# Patient Record
Sex: Male | Born: 1985 | Race: White | Hispanic: No | Marital: Married | Smoking: Never smoker
Health system: Southern US, Community
[De-identification: ages and names within clinical notes are randomized; demographics above are authoritative.]

## PROBLEM LIST (undated history)

## (undated) DIAGNOSIS — F410 Panic disorder [episodic paroxysmal anxiety] without agoraphobia: Secondary | ICD-10-CM

## (undated) DIAGNOSIS — K219 Gastro-esophageal reflux disease without esophagitis: Secondary | ICD-10-CM

## (undated) DIAGNOSIS — I509 Heart failure, unspecified: Secondary | ICD-10-CM

## (undated) DIAGNOSIS — G8929 Other chronic pain: Secondary | ICD-10-CM

## (undated) DIAGNOSIS — I272 Pulmonary hypertension, unspecified: Secondary | ICD-10-CM

## (undated) DIAGNOSIS — R011 Cardiac murmur, unspecified: Secondary | ICD-10-CM

## (undated) DIAGNOSIS — M8700 Idiopathic aseptic necrosis of unspecified bone: Secondary | ICD-10-CM

## (undated) DIAGNOSIS — I00 Rheumatic fever without heart involvement: Secondary | ICD-10-CM

## (undated) DIAGNOSIS — M549 Dorsalgia, unspecified: Secondary | ICD-10-CM

## (undated) DIAGNOSIS — Z954 Presence of other heart-valve replacement: Secondary | ICD-10-CM

## (undated) DIAGNOSIS — R0600 Dyspnea, unspecified: Secondary | ICD-10-CM

## (undated) DIAGNOSIS — I050 Rheumatic mitral stenosis: Secondary | ICD-10-CM

## (undated) DIAGNOSIS — J189 Pneumonia, unspecified organism: Secondary | ICD-10-CM

## (undated) DIAGNOSIS — M1990 Unspecified osteoarthritis, unspecified site: Secondary | ICD-10-CM

## (undated) DIAGNOSIS — F32A Depression, unspecified: Secondary | ICD-10-CM

## (undated) DIAGNOSIS — F329 Major depressive disorder, single episode, unspecified: Secondary | ICD-10-CM

## (undated) HISTORY — PX: MITRAL VALVULOPLASTY: SHX143

## (undated) HISTORY — PX: JOINT REPLACEMENT: SHX530

## (undated) HISTORY — PX: TYMPANOSTOMY TUBE PLACEMENT: SHX32

## (undated) NOTE — *Deleted (*Deleted)
Darren Haynes has dropped out of pulmonary rehab after attending 1 exercise session due to a fever for 2 1/2 weeks.  He has had a hip infection and his doctors are trying to decide where the fever is originating.  He feels he will not be able to participate in the program.

---
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
## 2007-07-18 ENCOUNTER — Encounter: Payer: Self-pay | Admitting: *Deleted

## 2008-01-25 ENCOUNTER — Emergency Department (HOSPITAL_COMMUNITY): Admission: EM | Admit: 2008-01-25 | Discharge: 2008-01-25 | Payer: Self-pay | Admitting: Emergency Medicine

## 2009-03-09 ENCOUNTER — Emergency Department (HOSPITAL_COMMUNITY): Admission: EM | Admit: 2009-03-09 | Discharge: 2009-03-09 | Payer: Self-pay | Admitting: Emergency Medicine

## 2009-07-01 ENCOUNTER — Emergency Department (HOSPITAL_COMMUNITY): Admission: EM | Admit: 2009-07-01 | Discharge: 2009-07-01 | Payer: Self-pay | Admitting: Emergency Medicine

## 2009-12-28 ENCOUNTER — Emergency Department (HOSPITAL_COMMUNITY): Admission: EM | Admit: 2009-12-28 | Discharge: 2009-12-28 | Payer: Self-pay | Admitting: Family Medicine

## 2010-01-28 ENCOUNTER — Emergency Department (HOSPITAL_COMMUNITY): Admission: EM | Admit: 2010-01-28 | Discharge: 2010-01-28 | Payer: Self-pay | Admitting: Family Medicine

## 2010-04-30 ENCOUNTER — Emergency Department (HOSPITAL_COMMUNITY): Admission: EM | Admit: 2010-04-30 | Discharge: 2010-04-30 | Payer: Self-pay | Admitting: Emergency Medicine

## 2011-06-15 DIAGNOSIS — L255 Unspecified contact dermatitis due to plants, except food: Secondary | ICD-10-CM | POA: Insufficient documentation

## 2011-06-15 DIAGNOSIS — T622X1A Toxic effect of other ingested (parts of) plant(s), accidental (unintentional), initial encounter: Secondary | ICD-10-CM | POA: Insufficient documentation

## 2011-06-15 NOTE — ED Notes (Signed)
CUTTING BRUSH TODAY, BOTH ARMS RED AND LEFT WRIST HAS BLISTERS

## 2011-06-16 ENCOUNTER — Emergency Department (HOSPITAL_COMMUNITY)
Admission: EM | Admit: 2011-06-16 | Discharge: 2011-06-16 | Disposition: A | Discharge status: Home / Self Care | Payer: Self-pay | Attending: Emergency Medicine | Admitting: Emergency Medicine

## 2011-06-16 DIAGNOSIS — L259 Unspecified contact dermatitis, unspecified cause: Secondary | ICD-10-CM

## 2011-06-16 MED ORDER — PREDNISONE 10 MG PO TABS: 20.0000 mg | ORAL_TABLET | Freq: Every day | ORAL | Status: AC

## 2011-06-16 MED ORDER — HYDROXYZINE HCL 50 MG/ML IM SOLN
50.0000 mg | Freq: Once | INTRAMUSCULAR | Status: AC
  Administered 2011-06-16: 50 mg via INTRAMUSCULAR
  Filled 2011-06-16: qty 1

## 2011-06-16 MED ORDER — METHYLPREDNISOLONE SODIUM SUCC 125 MG IJ SOLR
125.0000 mg | Freq: Once | INTRAMUSCULAR | Status: AC
Start: 2011-06-16 — End: 2011-06-16
  Administered 2011-06-16: 125 mg via INTRAMUSCULAR
  Filled 2011-06-16: qty 2

## 2011-06-16 MED ORDER — HYDROXYZINE PAMOATE 25 MG PO CAPS: ORAL_CAPSULE | ORAL | Status: DC

## 2011-06-16 MED ORDER — FEXOFENADINE HCL 180 MG PO TABS: 180.0000 mg | ORAL_TABLET | Freq: Every day | ORAL | Status: DC

## 2011-06-16 NOTE — ED Provider Notes (Signed)
History     CSN: [id number] Arrival date & time: 06/16/2011 12:14 AM  Chief Complaint  Patient presents with  . Poison Ivy   Patient is a 25 y.o. male presenting with Poison Ivy. The history is provided by the patient.  Poison Ivy The current episode started 1 to 4 weeks ago. The problem occurs constantly. The problem has been gradually worsening. Associated symptoms include a rash. Pertinent negatives include no abdominal pain, arthralgias, chest pain, coughing or neck pain. The symptoms are aggravated by nothing. He has tried nothing for the symptoms. The treatment provided no relief.  Poison Ivy The current episode started 1 to 4 weeks ago. The problem occurs constantly. The problem has been gradually worsening. Pertinent negatives include no chest pain, no abdominal pain and no shortness of breath. The symptoms are aggravated by nothing. He has tried nothing for the symptoms. The treatment provided no relief.    History reviewed. No pertinent past medical history.  Past Surgical History  Procedure Date  . Tympanostomy tube placement     No family history on file.  History  Substance Use Topics  . Smoking status: Not on file  . Smokeless tobacco: Current User  . Alcohol Use: No      Review of Systems  Constitutional: Negative for activity change.       All ROS Neg except as noted in HPI  HENT: Negative for nosebleeds and neck pain.   Eyes: Negative for photophobia and discharge.  Respiratory: Negative for cough, shortness of breath and wheezing.   Cardiovascular: Negative for chest pain and palpitations.  Gastrointestinal: Negative for abdominal pain and blood in stool.  Genitourinary: Negative for dysuria, frequency and hematuria.  Musculoskeletal: Negative for back pain and arthralgias.  Skin: Positive for rash.  Neurological: Negative for dizziness, seizures and speech difficulty.  Psychiatric/Behavioral: Negative for hallucinations and confusion.    Physical Exam   BP 147/82  Pulse 63  Temp(Src) 98.2 F (36.8 C) (Oral)  Resp 16  Ht 6\' 2"  (1.88 m)  Wt 165 lb (74.844 kg)  BMI 21.18 kg/m2  SpO2 99%  Physical Exam  Nursing note and vitals reviewed. Constitutional: He is oriented to person, place, and time. He appears well-developed and well-nourished.  Non-toxic appearance.  HENT:  Head: Normocephalic.  Right Ear: Tympanic membrane and external ear normal.  Left Ear: Tympanic membrane and external ear normal.  Eyes: EOM and lids are normal. Pupils are equal, round, and reactive to light.  Neck: Normal range of motion. Neck supple. Carotid bruit is not present.  Cardiovascular: Normal rate, regular rhythm, normal heart sounds, intact distal pulses and normal pulses.   Pulmonary/Chest: Breath sounds normal. No respiratory distress.  Abdominal: Soft. Bowel sounds are normal. There is no tenderness. There is no guarding.  Musculoskeletal: Normal range of motion.  Lymphadenopathy:       Head (right side): No submandibular adenopathy present.       Head (left side): No submandibular adenopathy present.    He has no cervical adenopathy.  Neurological: He is alert and oriented to person, place, and time. He has normal strength. No cranial nerve deficit or sensory deficit.  Skin: Skin is warm and dry. Rash noted.       Rash with blisters to the right and left forearm  Psychiatric: He has a normal mood and affect. His speech is normal.    ED Course  Procedures  MDM I have reviewed nursing notes, vital signs, and all appropriate lab  and imaging results for this patient.      Kathie Dike, [place] 06/16/11 0034 Medical screening examination/treatment/procedure(s) were performed by non-physician practitioner and as supervising physician I was immediately available for consultation/collaboration.  Nicoletta Dress. Colon Branch, MD 06/20/11 1225

## 2011-09-22 ENCOUNTER — Emergency Department (HOSPITAL_COMMUNITY)
Admission: EM | Admit: 2011-09-22 | Discharge: 2011-09-22 | Disposition: A | Discharge status: Home / Self Care | Payer: Self-pay | Attending: Emergency Medicine | Admitting: Emergency Medicine

## 2011-09-22 ENCOUNTER — Encounter (HOSPITAL_COMMUNITY): Payer: Self-pay

## 2011-09-22 DIAGNOSIS — R05 Cough: Secondary | ICD-10-CM | POA: Insufficient documentation

## 2011-09-22 DIAGNOSIS — Z79899 Other long term (current) drug therapy: Secondary | ICD-10-CM | POA: Insufficient documentation

## 2011-09-22 DIAGNOSIS — R509 Fever, unspecified: Secondary | ICD-10-CM | POA: Insufficient documentation

## 2011-09-22 DIAGNOSIS — R0982 Postnasal drip: Secondary | ICD-10-CM | POA: Insufficient documentation

## 2011-09-22 DIAGNOSIS — R059 Cough, unspecified: Secondary | ICD-10-CM | POA: Insufficient documentation

## 2011-09-22 DIAGNOSIS — IMO0001 Reserved for inherently not codable concepts without codable children: Secondary | ICD-10-CM | POA: Insufficient documentation

## 2011-09-22 DIAGNOSIS — J3489 Other specified disorders of nose and nasal sinuses: Secondary | ICD-10-CM | POA: Insufficient documentation

## 2011-09-22 DIAGNOSIS — R42 Dizziness and giddiness: Secondary | ICD-10-CM | POA: Insufficient documentation

## 2011-09-22 DIAGNOSIS — R51 Headache: Secondary | ICD-10-CM | POA: Insufficient documentation

## 2011-09-22 DIAGNOSIS — J069 Acute upper respiratory infection, unspecified: Secondary | ICD-10-CM | POA: Insufficient documentation

## 2011-09-22 DIAGNOSIS — R000 Tachycardia, unspecified: Secondary | ICD-10-CM | POA: Insufficient documentation

## 2011-09-22 MED ORDER — IBUPROFEN 800 MG PO TABS
800.0000 mg | ORAL_TABLET | Freq: Once | ORAL | Status: AC
  Administered 2011-09-22: 800 mg via ORAL
  Filled 2011-09-22: qty 1

## 2011-09-22 MED ORDER — ACETAMINOPHEN 500 MG PO TABS
1000.0000 mg | ORAL_TABLET | Freq: Once | ORAL | Status: AC
  Administered 2011-09-22: 1000 mg via ORAL
  Filled 2011-09-22: qty 2

## 2011-09-22 NOTE — ED Notes (Signed)
Pt presents with fever, generalized body aches, dizziness, and cough x 5 days. Pt denies n/v/d. Pt took OTC medication. No relief from medication.

## 2011-09-22 NOTE — ED Provider Notes (Signed)
Medical screening examination/treatment/procedure(s) were performed by non-physician practitioner and as supervising physician I was immediately available for consultation/collaboration.  Geoffery Lyons, MD 09/22/11 2249

## 2011-09-22 NOTE — ED Notes (Signed)
Pt a/ox4. Resp even and unlabored. NAD at this time. D/C instructions reviewed with pt. Pt verbalized understanding. Pt ambulated to POV with steady gate. 

## 2011-09-22 NOTE — ED Provider Notes (Signed)
History     CSN: [id number] Arrival date & time: 09/22/2011  6:08 PM   First MD Initiated Contact with Patient 09/22/11 1811      Chief Complaint  Patient presents with  . Generalized Body Aches  . Fever  . Cough  . Dizziness    (Consider location/radiation/quality/duration/timing/severity/associated sxs/prior treatment) Patient is a 25 y.o. male presenting with fever and cough. The history is provided by the patient.  Fever Primary symptoms of the febrile illness include fever, headaches, cough and myalgias. Primary symptoms do not include vomiting, diarrhea or rash. The current episode started 3 to 5 days ago. This is a new problem.  Associated with: nothing.  Cough Associated symptoms include headaches and myalgias.    No past medical history on file.  Past Surgical History  Procedure Date  . Tympanostomy tube placement     No family history on file.  History  Substance Use Topics  . Smoking status: Not on file  . Smokeless tobacco: Current User  . Alcohol Use: No      Review of Systems  Constitutional: Positive for fever.  HENT: Positive for postnasal drip.   Respiratory: Positive for cough.   Gastrointestinal: Negative for vomiting and diarrhea.  Genitourinary: Negative.   Musculoskeletal: Positive for myalgias.  Skin: Negative for rash.  Neurological: Positive for headaches.  Psychiatric/Behavioral: Negative.     Allergies  Review of patient's allergies indicates no known allergies.  Home Medications   Current Outpatient Rx  Name Route Sig Dispense Refill  . FEXOFENADINE HCL 180 MG PO TABS Oral Take 1 tablet (180 mg total) by mouth daily. 10 tablet 0  . HYDROXYZINE PAMOATE 25 MG PO CAPS  1 at hs for itching/burning 10 capsule 0    There were no vitals taken for this visit.  Physical Exam  Nursing note and vitals reviewed. Constitutional: He is oriented to person, place, and time. He appears well-developed and well-nourished.  Non-toxic  appearance.  HENT:  Head: Normocephalic.  Right Ear: Tympanic membrane and external ear normal.  Left Ear: Tympanic membrane and external ear normal.       Nasal congestion present. Mild increase redness of the posterior pharynx.  Eyes: EOM and lids are normal. Pupils are equal, round, and reactive to light.  Neck: Normal range of motion. Neck supple. Carotid bruit is not present.  Cardiovascular: Normal heart sounds, intact distal pulses and normal pulses.  Tachycardia present.   Pulmonary/Chest: Breath sounds normal. No respiratory distress.  Abdominal: Soft. Bowel sounds are normal. There is no tenderness. There is no guarding.  Musculoskeletal:       Decrease ROM of the upper and lower extremities as well as the back.  Lymphadenopathy:       Head (right side): No submandibular adenopathy present.       Head (left side): No submandibular adenopathy present.    He has no cervical adenopathy.  Neurological: He is alert and oriented to person, place, and time. He has normal strength. No cranial nerve deficit or sensory deficit.  Skin: Skin is warm and dry. No rash noted.  Psychiatric: He has a normal mood and affect. His speech is normal.    ED Course  Procedures (including critical care time)  Labs Reviewed - No data to display No results found.   Dx: URI   MDM  I have reviewed nursing notes, vital signs, and all appropriate lab and imaging results for this patient.        Link Snuffer  Garry Heater, PA 09/22/11 [phone]

## 2012-05-12 ENCOUNTER — Emergency Department (HOSPITAL_COMMUNITY)
Admission: EM | Admit: 2012-05-12 | Discharge: 2012-05-12 | Disposition: A | Discharge status: Home / Self Care | Payer: Self-pay | Attending: Emergency Medicine | Admitting: Emergency Medicine

## 2012-05-12 ENCOUNTER — Encounter (HOSPITAL_COMMUNITY): Payer: Self-pay | Admitting: Emergency Medicine

## 2012-05-12 DIAGNOSIS — J020 Streptococcal pharyngitis: Secondary | ICD-10-CM | POA: Insufficient documentation

## 2012-05-12 DIAGNOSIS — J039 Acute tonsillitis, unspecified: Secondary | ICD-10-CM

## 2012-05-12 LAB — URINALYSIS, ROUTINE W REFLEX MICROSCOPIC
Bilirubin Urine: NEGATIVE
Glucose, UA: NEGATIVE mg/dL
Hgb urine dipstick: NEGATIVE
Ketones, ur: NEGATIVE mg/dL
Leukocytes, UA: NEGATIVE
Nitrite: NEGATIVE
Protein, ur: NEGATIVE mg/dL
Specific Gravity, Urine: 1.028 (ref 1.005–1.030)
Urobilinogen, UA: 1 mg/dL (ref 0.0–1.0)
pH: 6 (ref 5.0–8.0)

## 2012-05-12 LAB — RAPID STREP SCREEN (MED CTR MEBANE ONLY): Streptococcus, Group A Screen (Direct): POSITIVE — AB

## 2012-05-12 MED ORDER — PENICILLIN V POTASSIUM 500 MG PO TABS: 500.0000 mg | ORAL_TABLET | Freq: Four times a day (QID) | ORAL | Status: AC

## 2012-05-12 MED ORDER — ACETAMINOPHEN 325 MG PO TABS
ORAL_TABLET | ORAL | Status: AC
  Filled 2012-05-12: qty 2

## 2012-05-12 MED ORDER — ACETAMINOPHEN 325 MG PO TABS
650.0000 mg | ORAL_TABLET | Freq: Once | ORAL | Status: AC
  Administered 2012-05-12: 650 mg via ORAL

## 2012-05-12 NOTE — Discharge Instructions (Signed)
Use Tylenol or Motrin for pain, and fever. Drink plenty of fluids and get a lot of rest.  Tonsillitis Tonsils are lumps of tissue at the back of the throat. Tonsillitis is an infection of the throat. This infection causes the tonsils to become red, tender, and puffy (swollen). If germs (bacteria) caused the infection, an antibiotic medicine will be given to you. If your tonsillitis is severe and happens often, you may need to get your tonsils removed (tonsillectomy). HOME CARE   Rest and sleep often.   Drink enough fluids to keep your pee (urine) clear or pale yellow.   While your throat is sore, eat soft or liquid foods like:   Soup.   Ice cream.   Instant breakfast drinks.   Eat frozen ice pops.   Gargle with a warm or cold liquid to help soothe the throat. Gargle with a water and salt mix. Mix 1 teaspoon of salt in 1 cup of water.   Only take medicines as told by your doctor.   If you are given medicines (antibiotics), take them as told. Finish them even if you start to feel better.  GET HELP RIGHT AWAY IF:   You throw up (vomit).   You have a very bad headache.   You have a stiff neck.   You have chest pain.   You have trouble breathing or swallowing.   You have bad throat pain, drooling, or your voice changes.   You have bad pain not helped by medicine.   You cannot fully open your mouth.   You have redness, puffiness, or bad pain in the neck.   You have a fever.   The patient is a child younger than 3 months and has a fever.   The patient is a child older than 3 months and has a fever or problems that do not go away.   The patient is a child older than 3 months, has a fever, and his or her problems get worse.   You have large, tender lumps on your neck.   You have a rash.   You cough up green, yellow-brown, or bloody fluid.   You cannot swallow liquids or food for 24 hours.   The patient is a child and cannot swallow liquids or food for 12 hours.    MAKE SURE YOU:   Understand these instructions.   Will watch your condition.   Will get help right away if you are not doing well or get worse.  Document Released: 04/16/2008 Document Revised: 10/18/2011 Document Reviewed: 01/04/2011 [facility] Patient Information 2012 [place], [place].

## 2012-05-12 NOTE — ED Notes (Signed)
Pt reports sore throat, pain w/swallowing, fever, chills, and bilateral ear aches x3 days, pt reports taking OTC medication w/no relief

## 2012-05-12 NOTE — ED Provider Notes (Signed)
History    This chart was scribed for Flint Melter, MD, MD by Smitty Pluck. The patient was seen in room TR07C and the patient's care was started at 5:15PM.   CSN: [id number]  Arrival date & time 05/12/12  1544   None     Chief Complaint  Patient presents with  . Sore Throat    (Consider location/radiation/quality/duration/timing/severity/associated sxs/prior treatment) The history is provided by the patient.   Darren Haynes is a 26 y.o. male who presents to the Emergency Department complaining of moderate sore throat onset 2 days ago. Pt reports that he has been increased sleeping for past two days. Symptoms have been constant since onset. Reports fever. Denies back pain. Reports having diarrhea 3x. He states he had normal intake today. Denies taking medication PTA. Denies any other health issues. Reports he is currently taking prednisone (3 days for poison ivy.  History reviewed. No pertinent past medical history.  Past Surgical History  Procedure Date  . Tympanostomy tube placement     History reviewed. No pertinent family history.  History  Substance Use Topics  . Smoking status: Never Smoker   . Smokeless tobacco: Current User  . Alcohol Use: No      Review of Systems  Constitutional: Positive for fever. Negative for chills.  HENT: Positive for sore throat. Negative for congestion and rhinorrhea.   Respiratory: Negative for cough and shortness of breath.   Gastrointestinal: Positive for diarrhea. Negative for nausea and vomiting.  All other systems reviewed and are negative.    Allergies  Review of patient's allergies indicates no known allergies.  Home Medications   Current Outpatient Rx  Name Route Sig Dispense Refill  . IBUPROFEN 200 MG PO TABS Oral Take 800 mg by mouth every 6 (six) hours as needed. For pain.    [name] Kitchen PREDNISONE 20 MG PO TABS Oral Take 20 mg by mouth daily. Taper pack...started on 6/27    . PENICILLIN V POTASSIUM 500 MG PO TABS Oral Take  1 tablet (500 mg total) by mouth 4 (four) times daily. 40 tablet 0    BP 116/67  Pulse 106  Temp 101.2 F (38.4 C) (Oral)  Resp 18  SpO2 100%  Physical Exam  Nursing note and vitals reviewed. Constitutional: He is oriented to person, place, and time. He appears well-developed and well-nourished. No distress.  HENT:  Head: Normocephalic and atraumatic.  Right Ear: External ear normal.  Left Ear: External ear normal.       Right tonsillar hypertrophy exudate  Tender jugular digastric node on right  Eyes: Conjunctivae are normal. Pupils are equal, round, and reactive to light.  Neck: Normal range of motion. Neck supple.  Cardiovascular: Normal rate, regular rhythm and normal heart sounds.   Pulmonary/Chest: Effort normal. No respiratory distress.  Neurological: He is alert and oriented to person, place, and time.  Skin: Skin is warm and dry.  Psychiatric: He has a normal mood and affect. His behavior is normal.    ED Course  Procedures (including critical care time) DIAGNOSTIC STUDIES: Oxygen Saturation is 100% on room air, normal by my interpretation.    COORDINATION OF CARE: 5:23PM EDP discusses pt ED treatment with pt.    Labs Reviewed  RAPID STREP SCREEN - Abnormal; Notable for the following:    Streptococcus, Group A Screen (Direct) POSITIVE (*)     All other components within normal limits  URINALYSIS, ROUTINE W REFLEX MICROSCOPIC  LAB REPORT - SCANNED   No results  found.   1. Tonsillitis       MDM  Strep Tonsillitis. Doubt metabolic instability, serious bacterial infection or impending vascular collapse; the patient is stable for discharge.  Plan: Home Medications- PCN; Home Treatments- rest, Tylenol; Recommended follow up- PCP of choice prn  I personally performed the services described in this documentation, which was scribed in my presence. The recorded information has been reviewed and considered.         Flint Melter, MD 05/13/12 1032

## 2012-05-12 NOTE — ED Notes (Addendum)
Sick for 2 days; c/o sore throat, otalgia, headaches, fevers, chills; been taking OTC without relief; pt also reports urinary freq- denies dysuria or hematuria; pts throat swollen and white patches noted in back of throat

## 2014-01-07 DIAGNOSIS — G4733 Obstructive sleep apnea (adult) (pediatric): Secondary | ICD-10-CM | POA: Insufficient documentation

## 2014-06-15 DIAGNOSIS — K219 Gastro-esophageal reflux disease without esophagitis: Secondary | ICD-10-CM | POA: Insufficient documentation

## 2014-06-15 DIAGNOSIS — E669 Obesity, unspecified: Secondary | ICD-10-CM | POA: Insufficient documentation

## 2014-07-14 ENCOUNTER — Telehealth: Payer: Self-pay | Admitting: Family Medicine

## 2014-07-14 NOTE — Telephone Encounter (Signed)
Pt is a new pt and advised no appts available today. Pt will go to urgent care today.

## 2016-06-02 ENCOUNTER — Encounter (HOSPITAL_COMMUNITY): Payer: Self-pay | Admitting: *Deleted

## 2016-06-02 DIAGNOSIS — Z5321 Procedure and treatment not carried out due to patient leaving prior to being seen by health care provider: Secondary | ICD-10-CM | POA: Diagnosis not present

## 2016-06-02 DIAGNOSIS — H9202 Otalgia, left ear: Secondary | ICD-10-CM | POA: Diagnosis present

## 2016-06-02 NOTE — ED Notes (Signed)
Lt ear pain for 8 days  Previous history of the samed  Has not been swimming during this time

## 2016-06-03 ENCOUNTER — Emergency Department (HOSPITAL_COMMUNITY)
Admission: EM | Admit: 2016-06-03 | Discharge: 2016-06-03 | Disposition: A | Discharge status: Home / Self Care | Payer: 59 | Attending: Dermatology | Admitting: Dermatology

## 2016-06-04 DIAGNOSIS — H60502 Unspecified acute noninfective otitis externa, left ear: Secondary | ICD-10-CM | POA: Insufficient documentation

## 2016-06-04 DIAGNOSIS — H66012 Acute suppurative otitis media with spontaneous rupture of ear drum, left ear: Secondary | ICD-10-CM | POA: Insufficient documentation

## 2017-11-25 ENCOUNTER — Ambulatory Visit (HOSPITAL_COMMUNITY)
Admission: RE | Admit: 2017-11-25 | Discharge: 2017-11-25 | Disposition: A | Discharge status: Home / Self Care | Payer: Self-pay | Source: Ambulatory Visit | Attending: Internal Medicine | Admitting: Internal Medicine

## 2017-11-25 ENCOUNTER — Other Ambulatory Visit (HOSPITAL_COMMUNITY): Payer: Self-pay | Admitting: Internal Medicine

## 2017-11-25 DIAGNOSIS — M546 Pain in thoracic spine: Secondary | ICD-10-CM

## 2017-11-25 DIAGNOSIS — Z1389 Encounter for screening for other disorder: Secondary | ICD-10-CM | POA: Insufficient documentation

## 2017-11-25 DIAGNOSIS — M549 Dorsalgia, unspecified: Secondary | ICD-10-CM | POA: Insufficient documentation

## 2018-06-05 DIAGNOSIS — R0781 Pleurodynia: Secondary | ICD-10-CM | POA: Insufficient documentation

## 2018-06-06 ENCOUNTER — Other Ambulatory Visit (HOSPITAL_COMMUNITY): Payer: Self-pay | Admitting: Orthopedic Surgery

## 2018-06-06 DIAGNOSIS — R0789 Other chest pain: Secondary | ICD-10-CM

## 2018-06-11 ENCOUNTER — Ambulatory Visit (HOSPITAL_COMMUNITY)
Admission: RE | Admit: 2018-06-11 | Discharge: 2018-06-11 | Disposition: A | Discharge status: Home / Self Care | Payer: Medicaid Other | Source: Ambulatory Visit | Attending: Orthopedic Surgery | Admitting: Orthopedic Surgery

## 2018-06-11 DIAGNOSIS — R0789 Other chest pain: Secondary | ICD-10-CM | POA: Insufficient documentation

## 2018-06-29 ENCOUNTER — Emergency Department (HOSPITAL_COMMUNITY)
Admission: EM | Admit: 2018-06-29 | Discharge: 2018-06-29 | Disposition: A | Discharge status: Home / Self Care | Payer: Medicaid Other | Attending: Emergency Medicine | Admitting: Emergency Medicine

## 2018-06-29 ENCOUNTER — Encounter (HOSPITAL_COMMUNITY): Payer: Self-pay | Admitting: *Deleted

## 2018-06-29 ENCOUNTER — Other Ambulatory Visit: Payer: Self-pay

## 2018-06-29 DIAGNOSIS — L237 Allergic contact dermatitis due to plants, except food: Secondary | ICD-10-CM

## 2018-06-29 HISTORY — DX: Dorsalgia, unspecified: M54.9

## 2018-06-29 HISTORY — DX: Other chronic pain: G89.29

## 2018-06-29 HISTORY — DX: Panic disorder (episodic paroxysmal anxiety): F41.0

## 2018-06-29 MED ORDER — DEXAMETHASONE SODIUM PHOSPHATE 10 MG/ML IJ SOLN
10.0000 mg | Freq: Once | INTRAMUSCULAR | Status: AC
  Administered 2018-06-29: 10 mg via INTRAMUSCULAR
  Filled 2018-06-29: qty 1

## 2018-06-29 MED ORDER — DEXAMETHASONE 4 MG PO TABS: 4.0000 mg | ORAL_TABLET | Freq: Two times a day (BID) | ORAL | 0 refills | Status: DC

## 2018-06-29 NOTE — ED Triage Notes (Signed)
Pt c/o rash to bilateral eyes, arms, legs, feet x 2 days. Pt reports he was in some poison oak 2 days ago. Pt reports his right eye was swollen this morning.

## 2018-06-29 NOTE — Discharge Instructions (Addendum)
Please wash all clothing and linen daily until this rash has completely resolved.  Please wash hands frequently.  Use Decadron 2 times daily with a meal.  Use Claritin or Allegra each morning, use Benadryl at bedtime if needed for itching.  Please see your physician at the [facility] if not improving.  Please return to the emergency department if any emergent changes in your condition, problems, or concerns.

## 2018-06-29 NOTE — ED Notes (Signed)
PT called and made aware of his prescriptions that were electronically prescribed today.

## 2018-06-29 NOTE — ED Provider Notes (Signed)
[facility] EMERGENCY DEPARTMENT Provider Note   CSN: [id number] Arrival date & time: 06/29/18  0907     History   Chief Complaint Chief Complaint  Patient presents with  . Rash    HPI Darren Haynes is a 32 y.o. male.  Patient is a 32 year old male who presents to the emergency department with a contact rash.  The patient states a few days ago while working he noticed that he was in some poison ivy/poison oak.  He has been trying to take care of the rash with conservative measures, but these have not been effective.  Yesterday and today he noted some swelling around his eyes and noticed a little bit of the rash in that area.  He became concerned and came to the emergency department for additional evaluation and management.  No difficulty with breathing or swallowing.  The history is provided by the patient.    Past Medical History:  Diagnosis Date  . Chronic back pain   . Panic attacks     There are no active problems to display for this patient.   Past Surgical History:  Procedure Laterality Date  . TYMPANOSTOMY TUBE PLACEMENT          Home Medications    Prior to Admission medications   Medication Sig Start Date End Date Taking? Authorizing Provider  ibuprofen (ADVIL,MOTRIN) 200 MG tablet Take 800 mg by mouth every 6 (six) hours as needed. For pain.    [provider]  predniSONE (DELTASONE) 20 MG tablet Take 20 mg by mouth daily. Taper pack...started on 6/27    [provider]    Family History No family history on file.  Social History Social History   Tobacco Use  . Smoking status: Never Smoker  . Smokeless tobacco: Current User  Substance Use Topics  . Alcohol use: No  . Drug use: No     Allergies   Patient has no known allergies.   Review of Systems Review of Systems  Constitutional: Negative for activity change.       All ROS Neg except as noted in HPI  HENT: Negative for nosebleeds.   Eyes: Negative for photophobia  and discharge.  Respiratory: Negative for cough, shortness of breath and wheezing.   Cardiovascular: Negative for chest pain and palpitations.  Gastrointestinal: Negative for abdominal pain and blood in stool.  Genitourinary: Negative for dysuria, frequency and hematuria.  Musculoskeletal: Negative for arthralgias, back pain and neck pain.  Skin: Positive for rash.  Neurological: Negative for dizziness, seizures and speech difficulty.  Psychiatric/Behavioral: Negative for confusion and hallucinations.     Physical Exam Updated Vital Signs BP 132/76   Pulse 68   Temp (!) 97.1 F (36.2 C) (Temporal)   Resp 16   Ht 6\' 1"  (1.854 m)   Wt 80.7 kg   SpO2 100%   BMI 23.48 kg/m   Physical Exam  Constitutional: He is oriented to person, place, and time. He appears well-developed and well-nourished.  Non-toxic appearance.  HENT:  Head: Normocephalic.  Right Ear: Tympanic membrane and external ear normal.  Left Ear: Tympanic membrane and external ear normal.  There is increased redness of the upper lids.  There he wanted to find bumps on the upper lid and also a few at the lower orbit area.  The patient can open and close the eye without problem.  The extraocular movement is intact.  Eyes: Pupils are equal, round, and reactive to light. EOM and lids are normal.  Neck: Normal range of motion. Neck supple. Carotid bruit is not present.  Cardiovascular: Normal rate, regular rhythm, normal heart sounds, intact distal pulses and normal pulses.  Pulmonary/Chest: Breath sounds normal. No respiratory distress.  Abdominal: Soft. Bowel sounds are normal. There is no tenderness. There is no guarding.  Musculoskeletal: Normal range of motion.  Lymphadenopathy:       Head (right side): No submandibular adenopathy present.       Head (left side): No submandibular adenopathy present.    He has no cervical adenopathy.  Neurological: He is alert and oriented to person, place, and time. He has normal  strength. No cranial nerve deficit or sensory deficit.  Skin: Skin is warm and dry.  There is a red rash noted on the arms and legs.  Some of them have a fine bumps present and a few with tiny blisters present.  Psychiatric: He has a normal mood and affect. His speech is normal.  Nursing note and vitals reviewed.    ED Treatments / Results  Labs (all labs ordered are listed, but only abnormal results are displayed) Labs Reviewed - No data to display  EKG None  Radiology No results found.  Procedures Procedures (including critical care time)  Medications Ordered in ED Medications  dexamethasone (DECADRON) injection 10 mg (has no administration in time range)     Initial Impression / Assessment and Plan / ED Course  I have reviewed the triage vital signs and the nursing notes.  Pertinent labs & imaging results that were available during my care of the patient were reviewed by me and considered in my medical decision making (see chart for details).       Final Clinical Impressions(s) / ED Diagnoses MDM  Vital signs are within normal limits.  Patient has a contact dermatitis probably with poison ivy or poison oak.  No systemic reaction noted.  The patient will be treated here in the emergency department with intramuscular Decadron.  Patient was placed on a short course of Decadron orally.  I have asked the patient to use Claritin or Allegra each morning and then Benadryl at bedtime if needed for the itching.  Patient acknowledges understanding of the instructions.  Of also asked the patient to wash all linen and all clothing daily until this has completely resolved.   Final diagnoses:  None    ED Discharge Orders    None    Notified by nursing staff that the patient gave her information that he has some steroids at home.  He has already received his steroid injection.  He states that he and his wife had to leave.  The patient left before receiving discharge instructions  and prescriptions.   Ivery Quale, PA-C 06/29/18 1135    Samuel Jester, DO 06/29/18 [phone]

## 2018-06-29 NOTE — ED Notes (Addendum)
PT left without d/c papers. Ivery Quale, PA made aware.

## 2018-07-08 DIAGNOSIS — M542 Cervicalgia: Secondary | ICD-10-CM | POA: Insufficient documentation

## 2018-07-09 ENCOUNTER — Other Ambulatory Visit (HOSPITAL_COMMUNITY): Payer: Self-pay | Admitting: Orthopedic Surgery

## 2018-07-09 DIAGNOSIS — M545 Low back pain: Secondary | ICD-10-CM

## 2018-07-15 ENCOUNTER — Ambulatory Visit (HOSPITAL_COMMUNITY)
Admission: RE | Admit: 2018-07-15 | Discharge: 2018-07-15 | Disposition: A | Discharge status: Home / Self Care | Payer: Self-pay | Source: Ambulatory Visit | Attending: Orthopedic Surgery | Admitting: Orthopedic Surgery

## 2018-07-15 ENCOUNTER — Ambulatory Visit (HOSPITAL_COMMUNITY)
Admission: RE | Admit: 2018-07-15 | Discharge: 2018-07-15 | Disposition: A | Discharge status: Home / Self Care | Payer: Medicaid Other | Source: Ambulatory Visit | Attending: Orthopedic Surgery | Admitting: Orthopedic Surgery

## 2018-07-15 DIAGNOSIS — M545 Low back pain: Secondary | ICD-10-CM

## 2018-07-15 DIAGNOSIS — M546 Pain in thoracic spine: Secondary | ICD-10-CM | POA: Insufficient documentation

## 2018-07-15 DIAGNOSIS — M5124 Other intervertebral disc displacement, thoracic region: Secondary | ICD-10-CM | POA: Insufficient documentation

## 2018-07-15 DIAGNOSIS — M5127 Other intervertebral disc displacement, lumbosacral region: Secondary | ICD-10-CM | POA: Insufficient documentation

## 2018-07-15 MED ORDER — GADOBENATE DIMEGLUMINE 529 MG/ML IV SOLN
15.0000 mL | Freq: Once | INTRAVENOUS | Status: AC | PRN
  Administered 2018-07-15: 15 mL via INTRAVENOUS

## 2018-07-15 MED ORDER — GADOBENATE DIMEGLUMINE 529 MG/ML IV SOLN: 15.0000 mL | Freq: Once | INTRAVENOUS | Status: DC | PRN

## 2018-08-07 ENCOUNTER — Other Ambulatory Visit (HOSPITAL_BASED_OUTPATIENT_CLINIC_OR_DEPARTMENT_OTHER): Payer: Self-pay | Admitting: Pain Medicine

## 2018-08-07 DIAGNOSIS — R2990 Unspecified symptoms and signs involving the nervous system: Secondary | ICD-10-CM

## 2018-08-08 ENCOUNTER — Ambulatory Visit (HOSPITAL_COMMUNITY)
Admission: RE | Admit: 2018-08-08 | Discharge: 2018-08-08 | Disposition: A | Discharge status: Home / Self Care | Payer: Medicaid Other | Source: Ambulatory Visit | Attending: Pain Medicine | Admitting: Pain Medicine

## 2018-08-08 ENCOUNTER — Other Ambulatory Visit (HOSPITAL_COMMUNITY): Payer: Self-pay | Admitting: Pain Medicine

## 2018-08-08 DIAGNOSIS — R2990 Unspecified symptoms and signs involving the nervous system: Secondary | ICD-10-CM

## 2018-08-08 DIAGNOSIS — R930 Abnormal findings on diagnostic imaging of skull and head, not elsewhere classified: Secondary | ICD-10-CM | POA: Insufficient documentation

## 2018-08-11 ENCOUNTER — Emergency Department (HOSPITAL_COMMUNITY)
Admission: EM | Admit: 2018-08-11 | Discharge: 2018-08-11 | Discharge status: Against Medical Advice | Payer: Medicaid Other | Attending: Emergency Medicine | Admitting: Emergency Medicine

## 2018-08-11 ENCOUNTER — Encounter (HOSPITAL_COMMUNITY): Payer: Self-pay | Admitting: *Deleted

## 2018-08-11 DIAGNOSIS — M546 Pain in thoracic spine: Secondary | ICD-10-CM | POA: Insufficient documentation

## 2018-08-11 DIAGNOSIS — R531 Weakness: Secondary | ICD-10-CM

## 2018-08-11 DIAGNOSIS — M6281 Muscle weakness (generalized): Secondary | ICD-10-CM | POA: Insufficient documentation

## 2018-08-11 DIAGNOSIS — F17220 Nicotine dependence, chewing tobacco, uncomplicated: Secondary | ICD-10-CM | POA: Insufficient documentation

## 2018-08-11 DIAGNOSIS — M545 Low back pain: Secondary | ICD-10-CM | POA: Insufficient documentation

## 2018-08-11 NOTE — ED Provider Notes (Signed)
[facility] EMERGENCY DEPARTMENT Provider Note   CSN: [id number] Arrival date & time: 08/11/18  1931     History   Chief Complaint Chief Complaint  Patient presents with  . Numbness    HPI Darren Haynes is a 32 y.o. male.  HPI Patient presents with back pain and weakness.  Has been having back pain over the last 11 months.  Has been seen at several different places for it.  Has had MRIs of brain cervical spine thoracic spine and lumbar spine.  Has had blood work done.  States he needs to see a neurologist but the neurologist he went to ordered an MRI but would not see him.  Has seen Dr. Darrelyn Hillock.  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  Otherwise core reassuring.  MRI of brain shows some nonspecific white matter lesions.  Patient states that it started with pain in his mid back that went to the whole back.  Then localized in the mid thoracic back.  States he now has weakness in his arms and legs.  Worse with exertion.  Worse with heat.  States he came here to see a neurologist.  No loss of bladder or bowel control.  Has had falls. Past Medical History:  Diagnosis Date  . Chronic back pain   . Panic attacks     There are no active problems to display for this patient.   Past Surgical History:  Procedure Laterality Date  . TYMPANOSTOMY TUBE PLACEMENT          Home Medications    Prior to Admission medications   Medication Sig Start Date End Date Taking? Authorizing Provider  dexamethasone (DECADRON) 4 MG tablet Take 1 tablet (4 mg total) by mouth 2 (two) times daily with a meal. 06/29/18   Ivery Quale, PA-C  ibuprofen (ADVIL,MOTRIN) 200 MG tablet Take 800 mg by mouth every 6 (six) hours as needed. For pain.    [provider]  predniSONE (DELTASONE) 20 MG tablet Take 20 mg by mouth daily. Taper pack...started on 6/27    [provider]    Family History History reviewed. No pertinent family history.  Social History Social History   Tobacco  Use  . Smoking status: Never Smoker  . Smokeless tobacco: Current User    Types: Snuff  Substance Use Topics  . Alcohol use: No  . Drug use: No     Allergies   Patient has no known allergies.   Review of Systems Review of Systems  Constitutional: Negative for appetite change.  HENT: Negative for congestion.   Respiratory: Negative for shortness of breath.   Gastrointestinal: Negative for abdominal pain.  Genitourinary: Negative for flank pain.  Musculoskeletal: Positive for back pain and gait problem.  Skin: Negative for rash.  Neurological: Positive for weakness.  Psychiatric/Behavioral: Negative for confusion.     Physical Exam Updated Vital Signs BP 137/84 (BP Location: Right Arm)   Pulse 94   Temp 98.3 F (36.8 C) (Oral)   Resp 18   Ht 6\' 1"  (1.854 m)   Wt 79.8 kg   SpO2 100%   BMI 23.22 kg/m   Physical Exam  Constitutional: He is oriented to person, place, and time. He appears well-developed.  HENT:  Head: Atraumatic.  Eyes: Pupils are equal, round, and reactive to light.  Neck: Neck supple.  Cardiovascular: Normal rate.  Pulmonary/Chest: Effort normal.  Abdominal: There is no tenderness.  Musculoskeletal: He exhibits no tenderness.  Neurological: He is alert  and oriented to person, place, and time.  Good grip strength bilaterally.  Able to ambulate but appears to have some pain with it.  Able to do a squat.  Skin: Skin is warm. Capillary refill takes less than 2 seconds.     ED Treatments / Results  Labs (all labs ordered are listed, but only abnormal results are displayed) Labs Reviewed - No data to display  EKG None  Radiology No results found.  Procedures Procedures (including critical care time)  Medications Ordered in ED Medications - No data to display   Initial Impression / Assessment and Plan / ED Course  I have reviewed the triage vital signs and the nursing notes.  Pertinent labs & imaging results that were available during  my care of the patient were reviewed by me and considered in my medical decision making (see chart for details).     Patient with back pain and weakness over 11 months.  Worse over last 3 months.  Has seen neurosurgery and some neurologist.  Briefly discussed with neurologist at [facility].  Thinks could be more of an outpatient work-up.  Discussed with patient about following up as an outpatient neurologist we would give him information to follow-up.  Patient was not willing to stay for the paperwork since we cannot get him immediately seen by a neurologist.  Left without receiving his paperwork  Final Clinical Impressions(s) / ED Diagnoses   Final diagnoses:  Weakness    ED Discharge Orders    None       Benjiman Core, MD 08/11/18 2110

## 2018-08-11 NOTE — ED Triage Notes (Signed)
Pt with numbness and weakness to bilateral arms and legs for 11 months,  Pt has no insurance and per pt neurologist would not see pt. Pt has came here for evaluation due to pt not knowing where else to go.  Pt states he has had a MRI done here recently.  Pt concerned about having MS or  some other dx due to symptoms.

## 2018-08-11 NOTE — ED Notes (Signed)
Pt left before discharge papers were given.

## 2018-08-16 ENCOUNTER — Other Ambulatory Visit (HOSPITAL_BASED_OUTPATIENT_CLINIC_OR_DEPARTMENT_OTHER): Payer: Medicaid Other

## 2018-09-18 ENCOUNTER — Encounter: Payer: Self-pay | Admitting: Cardiovascular Disease

## 2018-09-29 LAB — PULMONARY FUNCTION TEST

## 2018-11-21 ENCOUNTER — Encounter: Payer: Self-pay | Admitting: Cardiovascular Disease

## 2018-11-21 DIAGNOSIS — I272 Pulmonary hypertension, unspecified: Secondary | ICD-10-CM | POA: Insufficient documentation

## 2018-12-01 ENCOUNTER — Encounter: Payer: Self-pay | Admitting: Cardiovascular Disease

## 2018-12-04 ENCOUNTER — Encounter: Payer: Self-pay | Admitting: Cardiovascular Disease

## 2018-12-08 ENCOUNTER — Emergency Department (HOSPITAL_COMMUNITY): Payer: BLUE CROSS/BLUE SHIELD

## 2018-12-08 ENCOUNTER — Other Ambulatory Visit: Payer: Self-pay

## 2018-12-08 ENCOUNTER — Emergency Department (HOSPITAL_COMMUNITY)
Admission: EM | Admit: 2018-12-08 | Discharge: 2018-12-09 | Disposition: A | Discharge status: Home / Self Care | Payer: BLUE CROSS/BLUE SHIELD | Attending: Emergency Medicine | Admitting: Emergency Medicine

## 2018-12-08 ENCOUNTER — Encounter (HOSPITAL_COMMUNITY): Payer: Self-pay

## 2018-12-08 DIAGNOSIS — R079 Chest pain, unspecified: Secondary | ICD-10-CM

## 2018-12-08 DIAGNOSIS — M79605 Pain in left leg: Secondary | ICD-10-CM | POA: Insufficient documentation

## 2018-12-08 DIAGNOSIS — R072 Precordial pain: Secondary | ICD-10-CM | POA: Diagnosis not present

## 2018-12-08 DIAGNOSIS — I050 Rheumatic mitral stenosis: Secondary | ICD-10-CM | POA: Insufficient documentation

## 2018-12-08 DIAGNOSIS — F17290 Nicotine dependence, other tobacco product, uncomplicated: Secondary | ICD-10-CM | POA: Diagnosis not present

## 2018-12-08 DIAGNOSIS — M79604 Pain in right leg: Secondary | ICD-10-CM

## 2018-12-08 MED ORDER — SODIUM CHLORIDE 0.9% FLUSH: 3.0000 mL | Freq: Once | INTRAVENOUS | Status: DC

## 2018-12-08 NOTE — ED Triage Notes (Signed)
Pt here with central chest pain and bilateral lower extremity edema.  States he was diagnosed with CHF in the last week.  Chest pain never goes away just continues to get worse. Pain has been there for approximately a year.   A&Ox4.

## 2018-12-09 ENCOUNTER — Ambulatory Visit (INDEPENDENT_AMBULATORY_CARE_PROVIDER_SITE_OTHER): Payer: BLUE CROSS/BLUE SHIELD | Admitting: Cardiovascular Disease

## 2018-12-09 ENCOUNTER — Encounter: Payer: Self-pay | Admitting: Cardiovascular Disease

## 2018-12-09 ENCOUNTER — Other Ambulatory Visit: Payer: Self-pay

## 2018-12-09 ENCOUNTER — Emergency Department (HOSPITAL_COMMUNITY)
Admission: EM | Admit: 2018-12-09 | Discharge: 2018-12-10 | Disposition: A | Discharge status: Home / Self Care | Payer: BLUE CROSS/BLUE SHIELD | Source: Home / Self Care | Attending: Emergency Medicine | Admitting: Emergency Medicine

## 2018-12-09 ENCOUNTER — Encounter (HOSPITAL_COMMUNITY): Payer: Self-pay | Admitting: Emergency Medicine

## 2018-12-09 VITALS — BP 114/72 | HR 56 | Ht 72.0 in | Wt 187.0 lb

## 2018-12-09 DIAGNOSIS — I342 Nonrheumatic mitral (valve) stenosis: Secondary | ICD-10-CM

## 2018-12-09 DIAGNOSIS — I050 Rheumatic mitral stenosis: Secondary | ICD-10-CM | POA: Insufficient documentation

## 2018-12-09 DIAGNOSIS — R072 Precordial pain: Secondary | ICD-10-CM

## 2018-12-09 LAB — BASIC METABOLIC PANEL
Anion gap: 10 (ref 5–15)
BUN: 14 mg/dL (ref 6–20)
CO2: 23 mmol/L (ref 22–32)
Calcium: 8.8 mg/dL — ABNORMAL LOW (ref 8.9–10.3)
Chloride: 105 mmol/L (ref 98–111)
Creatinine, Ser: 1.23 mg/dL (ref 0.61–1.24)
GFR calc Af Amer: >60 mL/min (ref >60)
GFR calc non Af Amer: >60 mL/min (ref >60)
Glucose, Bld: 156 mg/dL — ABNORMAL HIGH (ref 70–99)
Potassium: 4.2 mmol/L (ref 3.5–5.1)
Sodium: 138 mmol/L (ref 135–145)

## 2018-12-09 LAB — CBC
HCT: 38.6 % — ABNORMAL LOW (ref 39.0–52.0)
Hemoglobin: 12.6 g/dL — ABNORMAL LOW (ref 13.0–17.0)
MCH: 29.2 pg (ref 26.0–34.0)
MCHC: 32.6 g/dL (ref 30.0–36.0)
MCV: 89.4 fL (ref 80.0–100.0)
Platelets: 209 10*3/uL (ref 150–400)
RBC: 4.32 MIL/uL (ref 4.22–5.81)
RDW: 12.3 % (ref 11.5–15.5)
WBC: 6.7 10*3/uL (ref 4.0–10.5)
nRBC: 0 % (ref 0.0–0.2)

## 2018-12-09 LAB — D-DIMER, QUANTITATIVE: D-Dimer, Quant: <0.27 ug/mL-FEU (ref 0.00–0.50)

## 2018-12-09 LAB — I-STAT TROPONIN, ED: Troponin i, poc: 0 ng/mL (ref 0.00–0.08)

## 2018-12-09 LAB — BRAIN NATRIURETIC PEPTIDE: B Natriuretic Peptide: 968.4 pg/mL — ABNORMAL HIGH (ref 0.0–100.0)

## 2018-12-09 MED ORDER — FUROSEMIDE 40 MG PO TABS: 40.0000 mg | ORAL_TABLET | Freq: Every day | ORAL | 3 refills | Status: DC

## 2018-12-09 MED ORDER — ALUM & MAG HYDROXIDE-SIMETH 200-200-20 MG/5ML PO SUSP
30.0000 mL | Freq: Once | ORAL | Status: AC
  Administered 2018-12-09: 30 mL via ORAL
  Filled 2018-12-09: qty 30

## 2018-12-09 NOTE — Discharge Instructions (Addendum)
You were evaluated in the Emergency Department and after careful evaluation, we did not find any emergent condition requiring admission or further testing in the hospital.  Please return to the Emergency Department if you experience any worsening of your condition.  We encourage you to follow up with your surgeon later today.  Thank you for allowing us to be a part of your care.

## 2018-12-09 NOTE — ED Provider Notes (Signed)
[facility] EMERGENCY DEPARTMENT Provider Note   CSN: [id number] Arrival date & time: 12/09/18  2250     History   Chief Complaint Chief Complaint  Patient presents with  . Chest Pain    HPI Darren Haynes is a 33 y.o. male.  The history is provided by the patient and the spouse.  Chest Pain  Pain location:  Substernal area Pain quality: burning   Pain severity:  Moderate Onset quality:  Sudden Timing:  Constant Progression:  Improving Chronicity:  Recurrent Context: eating   Context comment:  Using an inhaler Relieved by:  None tried Associated symptoms: abdominal pain and cough   Associated symptoms: no fever, no lower extremity edema, no shortness of breath, no syncope and no vomiting   Patient with known history of mitral stenosis presents with chest pain.  He reports this evening after eating a heavy dinner and using his inhaler he began having chest burning into his abdomen. No shortness of breath.  He reports that he has heartburn.  No vomiting.  He reports cough without hemoptysis.  No new shortness of breath.  He reports chronic dyspnea on exertion from his mitral stenosis.  No syncope.   Past Medical History:  Diagnosis Date  . Chronic back pain   . Panic attacks     Patient Active Problem List   Diagnosis Date Noted  . Mitral stenosis 12/09/2018    Past Surgical History:  Procedure Laterality Date  . TYMPANOSTOMY TUBE PLACEMENT          Home Medications    Prior to Admission medications   Medication Sig Start Date End Date Taking? Authorizing Provider  albuterol (PROVENTIL) (2.5 MG/3ML) 0.083% nebulizer solution Take 2.5 mg by nebulization every 6 (six) hours as needed for wheezing or shortness of breath.    [provider]  ALPRAZolam Prudy Feeler) 0.25 MG tablet Take 0.25 mg by mouth 3 (three) times daily as needed for anxiety.    [provider]  aspirin EC 81 MG tablet Take 81 mg by mouth daily.    [provider]    Buprenorphine HCl (BELBUCA) 300 MCG FILM Place 1 Film inside cheek 2 (two) times daily.    [provider]  etodolac (LODINE XL) 400 MG 24 hr tablet Take 400 mg by mouth daily.    [provider]  fluticasone-salmeterol (ADVAIR HFA) [phone] MCG/ACT inhaler Inhale 2 puffs into the lungs 2 (two) times daily.    [provider]  furosemide (LASIX) 40 MG tablet Take 1 tablet (40 mg total) by mouth daily. DO NOT TAKE ON THE DAY OF YOUR PROCEDURE ON 12/11/2018 12/09/18 03/09/19  Runell Gess, MD  gabapentin (NEURONTIN) 300 MG capsule Take 1,500 mg by mouth at bedtime.     [provider]  HYDROcodone-acetaminophen (NORCO) 10-325 MG tablet Take 1 tablet by mouth every 8 (eight) hours as needed.    [provider]  metaxalone (SKELAXIN) 800 MG tablet Take 800 mg by mouth 4 (four) times daily.    [provider]  nortriptyline (PAMELOR) 25 MG capsule Take 25 mg by mouth daily. 10/24/18   [provider]  potassium chloride (K-DUR,KLOR-CON) 10 MEQ tablet Take 10 mEq by mouth daily.    [provider]  sertraline (ZOLOFT) 50 MG tablet Take 50 mg by mouth daily.    [provider]  temazepam (RESTORIL) 7.5 MG capsule Take 7.5 mg by mouth at bedtime as needed. 09/18/18   [provider]  Vitamin D, Ergocalciferol, (DRISDOL) 1.25 MG (50000 UT) CAPS capsule Take 50,000 Units by mouth every 7 (seven) days.    [provider]    Family History Family History  Problem Relation Age of Onset  . Heart disease Mother   . Depression Mother   . Hypertension Father   . Hyperlipidemia Father     Social History Social History   Tobacco Use  . Smoking status: Never Smoker  . Smokeless tobacco: Current User    Types: Snuff  Substance Use Topics  . Alcohol use: No  . Drug use: No     Allergies   Patient has no known allergies.   Review of Systems Review of Systems  Constitutional: Negative for fever.   Respiratory: Positive for cough. Negative for shortness of breath.   Cardiovascular: Positive for chest pain. Negative for leg swelling and syncope.  Gastrointestinal: Positive for abdominal pain. Negative for diarrhea and vomiting.  Neurological: Negative for syncope.  All other systems reviewed and are negative.    Physical Exam Updated Vital Signs BP 128/81   Pulse 68   Temp (!) 97.5 F (36.4 C) (Oral)   Resp 16   Ht 1.829 m (6')   Wt 84 kg   SpO2 100%   BMI 25.12 kg/m   Physical Exam  CONSTITUTIONAL: Well developed/well nourished HEAD: Normocephalic/atraumatic EYES: EOMI/PERRL ENMT: Mucous membranes moist NECK: supple no meningeal signs SPINE/BACK:entire spine nontender CV: S1/S2 noted, mitral opening snap noted LUNGS: Lungs are clear to auscultation bilaterally, no apparent distress ABDOMEN: soft, nontender, no rebound or guarding, bowel sounds noted throughout abdomen GU:no cva tenderness NEURO: Pt is awake/alert/appropriate, moves all extremitiesx4.  No facial droop.   EXTREMITIES: pulses normal/equal, full ROM, no lower extremity edema SKIN: warm, color normal PSYCH: no abnormalities of mood noted, alert and oriented to situation  ED Treatments / Results  Labs (all labs ordered are listed, but only abnormal results are displayed) Labs Reviewed - No data to display  EKG EKG Interpretation  Date/Time:  Tuesday December 09 2018 23:14:29 EST Ventricular Rate:  69 PR Interval:    QRS Duration: 101 QT Interval:  409 QTC Calculation: 439 R Axis:   69 Text Interpretation:  Sinus rhythm Left atrial enlargement Baseline wander in lead(s) V1 V5 Confirmed by Zadie Rhine ([id number]) on 12/09/2018 11:40:45 PM   Radiology Dg Chest 2 View  Result Date: 12/08/2018 CLINICAL DATA:  Central chest pain EXAM: CHEST - 2 VIEW COMPARISON:  Chest CT 06/11/2018 FINDINGS: Borderline heart size. Mild peribronchial thickening. No confluent airspace opacities or effusions. No  acute bony abnormality. IMPRESSION: Borderline heart size.  Mild bronchitic changes. Electronically Signed   By: Charlett Nose M.D.   On: 12/08/2018 23:58    Procedures Procedures (including critical care time)  Medications Ordered in ED Medications  alum & mag hydroxide-simeth (MAALOX/MYLANTA) 200-200-20 MG/5ML suspension 30 mL (30 mLs Oral Given 12/09/18 2335)     Initial Impression / Assessment and Plan / ED Course  I have reviewed the triage vital signs and the nursing notes.   Presents for recurrent chest pain/burning after eating.  He is now feeling improved.  No acute EKG changes.  He just had extensive ER evaluation with negative x-ray and negative troponins/d-dimer.  He has already been seen by cardiology and has plan for outpatient cardiac cath on January 30 for his mitral stenosis.  He already had a negative d-dimer on January 28 Suspicion for ACS/PE/dissection is low. No signs of acute heart failure  as his lungs are clear and no lower extremity edema. 12:10 AM Patient feeling improved.  He thinks this was heartburn as it occurred immediately after eating. He has follow-up plan in place with cardiac catheterization on January 30. Will discharge home Final Clinical Impressions(s) / ED Diagnoses   Final diagnoses:  Precordial pain  Mitral valve stenosis, unspecified etiology    ED Discharge Orders    None       Zadie RhineWickline, Deeana Atwater, MD 12/10/18 0010

## 2018-12-09 NOTE — ED Triage Notes (Signed)
Pt seen here for same this AM. Pt also seen at cardiology today. Pt states he came to the ER tonight because of worsening heartburn and chest pain.

## 2018-12-09 NOTE — H&P (View-Only) (Signed)
12/09/2018 Darren Haynes   1986/04/12  [id number]  [facility], [facility] Cardiologist: Darren Gess MD Darren Haynes, [place], [place]  HPI:  Darren Haynes is a 33 y.o..  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  He was referred by Dr. Hanley Haynes for evaluation of symptomatic mitral stenosis.  There is no history of rheumatic fever.  He has no other cardiac risk factors.  He is had progressive dyspnea for the last 9 months with some nonspecific aches and pains.  Is being worked up by rheumatologist.  Echo performed 11/21/2018 revealed normal LV systolic function, a peak mitral gradient of 47 mmHg with a mean of 29 mmHg and mitral valve area 1.5 cm with a PA pressure of 69 mmHg.  He does have severe dyspnea and lower extreme edema.  He is on oral diuretic.   Current Meds  Medication Sig  . albuterol (PROVENTIL) (2.5 MG/3ML) 0.083% nebulizer solution Take 2.5 mg by nebulization every 6 (six) hours as needed for wheezing or shortness of breath.  . ALPRAZolam (XANAX) 0.25 MG tablet Take 0.25 mg by mouth 3 (three) times daily as needed for anxiety.  [name] Kitchen aspirin EC 81 MG tablet Take 81 mg by mouth daily.  . Buprenorphine HCl (BELBUCA) 300 MCG FILM Place 1 Film inside cheek 2 (two) times daily.  [name] Kitchen etodolac (LODINE XL) 400 MG 24 hr tablet Take 400 mg by mouth daily.  . fluticasone-salmeterol (ADVAIR HFA) 45-21 MCG/ACT inhaler Inhale 2 puffs into the lungs 2 (two) times daily.  [name] Kitchen gabapentin (NEURONTIN) 300 MG capsule Take 1,500 mg by mouth at bedtime.   [name] Kitchen HYDROcodone-acetaminophen (NORCO) 10-325 MG tablet Take 1 tablet by mouth every 8 (eight) hours as needed.  . metaxalone (SKELAXIN) 800 MG tablet Take 800 mg by mouth 4 (four) times daily.  . nortriptyline (PAMELOR) 25 MG capsule Take 25 mg by mouth daily.  . potassium chloride (K-DUR,KLOR-CON) 10 MEQ  tablet Take 10 mEq by mouth daily.  . sertraline (ZOLOFT) 50 MG tablet Take 50 mg by mouth daily.  . temazepam (RESTORIL) 7.5 MG capsule Take 7.5 mg by mouth at bedtime as needed.  . Vitamin D, Ergocalciferol, (DRISDOL) 1.25 MG (50000 UT) CAPS capsule Take 50,000 Units by mouth every 7 (seven) days.  . [DISCONTINUED] furosemide (LASIX) 40 MG tablet Take 40 mg by mouth daily.     No Known Allergies  Social History   Socioeconomic History  . Marital status: Married    Spouse name: Not on file  . Number of children: Not on file  . Years of education: Not on file  . Highest education level: Not on file  Occupational History  . Not on file  Social Needs  . Financial resource strain: Not on file  . Food insecurity:    Worry: Not on file    Inability: Not on file  . Transportation needs:    Medical: Not on file    Non-medical: Not on file  Tobacco Use  . Smoking status: Never Smoker  . Smokeless tobacco: Current User    Types: Snuff  Substance and Sexual Activity  . Alcohol use: No  . Drug use: No  . Sexual activity: Not on file  Lifestyle  . Physical activity:    Days per week: Not on file    Minutes per session: Not on file  .  Stress: Not on file  Relationships  . Social connections:    Talks on phone: Not on file    Gets together: Not on file    Attends religious service: Not on file    Active member of club or organization: Not on file    Attends meetings of clubs or organizations: Not on file    Relationship status: Not on file  . Intimate partner violence:    Fear of current or ex partner: Not on file    Emotionally abused: Not on file    Physically abused: Not on file    Forced sexual activity: Not on file  Other Topics Concern  . Not on file  Social History Narrative  . Not on file     Review of Systems: General: negative for chills, fever, night sweats or weight changes.  Cardiovascular: negative for chest pain, dyspnea on exertion, edema, orthopnea,  palpitations, paroxysmal nocturnal dyspnea or shortness of breath Dermatological: negative for rash Respiratory: negative for cough or wheezing Urologic: negative for hematuria Abdominal: negative for nausea, vomiting, diarrhea, bright red blood per rectum, melena, or hematemesis Neurologic: negative for visual changes, syncope, or dizziness All other systems reviewed and are otherwise negative except as noted above.    Blood pressure 114/72, pulse (!) 56, height 6' (1.829 m), weight 187 lb (84.8 kg).  General appearance: alert and no distress Neck: no adenopathy, no carotid bruit, no JVD, supple, symmetrical, trachea midline and thyroid not enlarged, symmetric, no tenderness/mass/nodules Lungs: clear to auscultation bilaterally Heart: Mitral opening snap Extremities: extremities normal, atraumatic, no cyanosis or edema Pulses: 2+ and symmetric Skin: Skin color, texture, turgor normal. No rashes or lesions Neurologic: Alert and oriented X 3, normal strength and tone. Normal symmetric reflexes. Normal coordination and gait  EKG not performed today  ASSESSMENT AND PLAN:   Mitral stenosis Darren Haynes was referred to me by Dr. Rosario for evaluation of symptomatic mitral stenosis.  There is no history of rheumatic fever as a child.  Has had increasing dyspnea on exertion over the last 6 months with recent 2D echo performed 11/21/2018 revealing severe MS with a peak gradient of 47, mean of 29 with a mitral valve area of 1.5 cm.  And a pulmonary artery pressure of 69 mmHg.  He was briefly tried on Cialis for pulmonary vasodilatation which resulted in marked hypotension.  He was on Lasix 40 mg a day which was dropped down to 20 mg by his PCP however he is more symptomatic at this dose.  He is also being worked up for connective tissue disease by rheumatologist.  I am going arrange for him to undergo radial/brachial right heart and left heart cath followed by transesophageal echo performed by Dr.  Croitoru this coming Thursday.  The patient understands that risks included but are not limited to stroke (1 in 1000), death (1 in 1000), kidney failure [usually temporary] (1 in 500), bleeding (1 in 200), allergic reaction [possibly serious] (1 in 200). The patient understands and agrees to proceed      Darren Haynes J. Jermar Colter MD FACP,FACC,FAHA, FSCAI 12/09/2018 4:52 PM 

## 2018-12-09 NOTE — Progress Notes (Signed)
12/09/2018 Darren Haynes   1986/04/12  [id number]  [facility], [facility] Cardiologist: Runell Gess MD Milagros Loll, [place], [place]  HPI:  Darren Haynes is a 33 y.o..  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  He was referred by Dr. Hanley Hays for evaluation of symptomatic mitral stenosis.  There is no history of rheumatic fever.  He has no other cardiac risk factors.  He is had progressive dyspnea for the last 9 months with some nonspecific aches and pains.  Is being worked up by rheumatologist.  Echo performed 11/21/2018 revealed normal LV systolic function, a peak mitral gradient of 47 mmHg with a mean of 29 mmHg and mitral valve area 1.5 cm with a PA pressure of 69 mmHg.  He does have severe dyspnea and lower extreme edema.  He is on oral diuretic.   Current Meds  Medication Sig  . albuterol (PROVENTIL) (2.5 MG/3ML) 0.083% nebulizer solution Take 2.5 mg by nebulization every 6 (six) hours as needed for wheezing or shortness of breath.  . ALPRAZolam (XANAX) 0.25 MG tablet Take 0.25 mg by mouth 3 (three) times daily as needed for anxiety.  [name] Kitchen aspirin EC 81 MG tablet Take 81 mg by mouth daily.  . Buprenorphine HCl (BELBUCA) 300 MCG FILM Place 1 Film inside cheek 2 (two) times daily.  [name] Kitchen etodolac (LODINE XL) 400 MG 24 hr tablet Take 400 mg by mouth daily.  . fluticasone-salmeterol (ADVAIR HFA) 45-21 MCG/ACT inhaler Inhale 2 puffs into the lungs 2 (two) times daily.  [name] Kitchen gabapentin (NEURONTIN) 300 MG capsule Take 1,500 mg by mouth at bedtime.   [name] Kitchen HYDROcodone-acetaminophen (NORCO) 10-325 MG tablet Take 1 tablet by mouth every 8 (eight) hours as needed.  . metaxalone (SKELAXIN) 800 MG tablet Take 800 mg by mouth 4 (four) times daily.  . nortriptyline (PAMELOR) 25 MG capsule Take 25 mg by mouth daily.  . potassium chloride (K-DUR,KLOR-CON) 10 MEQ  tablet Take 10 mEq by mouth daily.  . sertraline (ZOLOFT) 50 MG tablet Take 50 mg by mouth daily.  . temazepam (RESTORIL) 7.5 MG capsule Take 7.5 mg by mouth at bedtime as needed.  . Vitamin D, Ergocalciferol, (DRISDOL) 1.25 MG (50000 UT) CAPS capsule Take 50,000 Units by mouth every 7 (seven) days.  . [DISCONTINUED] furosemide (LASIX) 40 MG tablet Take 40 mg by mouth daily.     No Known Allergies  Social History   Socioeconomic History  . Marital status: Married    Spouse name: Not on file  . Number of children: Not on file  . Years of education: Not on file  . Highest education level: Not on file  Occupational History  . Not on file  Social Needs  . Financial resource strain: Not on file  . Food insecurity:    Worry: Not on file    Inability: Not on file  . Transportation needs:    Medical: Not on file    Non-medical: Not on file  Tobacco Use  . Smoking status: Never Smoker  . Smokeless tobacco: Current User    Types: Snuff  Substance and Sexual Activity  . Alcohol use: No  . Drug use: No  . Sexual activity: Not on file  Lifestyle  . Physical activity:    Days per week: Not on file    Minutes per session: Not on file  .  Stress: Not on file  Relationships  . Social connections:    Talks on phone: Not on file    Gets together: Not on file    Attends religious service: Not on file    Active member of club or organization: Not on file    Attends meetings of clubs or organizations: Not on file    Relationship status: Not on file  . Intimate partner violence:    Fear of current or ex partner: Not on file    Emotionally abused: Not on file    Physically abused: Not on file    Forced sexual activity: Not on file  Other Topics Concern  . Not on file  Social History Narrative  . Not on file     Review of Systems: General: negative for chills, fever, night sweats or weight changes.  Cardiovascular: negative for chest pain, dyspnea on exertion, edema, orthopnea,  palpitations, paroxysmal nocturnal dyspnea or shortness of breath Dermatological: negative for rash Respiratory: negative for cough or wheezing Urologic: negative for hematuria Abdominal: negative for nausea, vomiting, diarrhea, bright red blood per rectum, melena, or hematemesis Neurologic: negative for visual changes, syncope, or dizziness All other systems reviewed and are otherwise negative except as noted above.    Blood pressure 114/72, pulse (!) 56, height 6' (1.829 m), weight 187 lb (84.8 kg).  General appearance: alert and no distress Neck: no adenopathy, no carotid bruit, no JVD, supple, symmetrical, trachea midline and thyroid not enlarged, symmetric, no tenderness/mass/nodules Lungs: clear to auscultation bilaterally Heart: Mitral opening snap Extremities: extremities normal, atraumatic, no cyanosis or edema Pulses: 2+ and symmetric Skin: Skin color, texture, turgor normal. No rashes or lesions Neurologic: Alert and oriented X 3, normal strength and tone. Normal symmetric reflexes. Normal coordination and gait  EKG not performed today  ASSESSMENT AND PLAN:   Mitral stenosis Mr. Jakubik was referred to me by Dr. Hanley Hays for evaluation of symptomatic mitral stenosis.  There is no history of rheumatic fever as a child.  Has had increasing dyspnea on exertion over the last 6 months with recent 2D echo performed 11/21/2018 revealing severe MS with a peak gradient of 47, mean of 29 with a mitral valve area of 1.5 cm.  And a pulmonary artery pressure of 69 mmHg.  He was briefly tried on Cialis for pulmonary vasodilatation which resulted in marked hypotension.  He was on Lasix 40 mg a day which was dropped down to 20 mg by his PCP however he is more symptomatic at this dose.  He is also being worked up for connective tissue disease by rheumatologist.  I am going arrange for him to undergo radial/brachial right heart and left heart cath followed by transesophageal echo performed by Dr.  Royann Shivers this coming Thursday.  The patient understands that risks included but are not limited to stroke (1 in 1000), death (1 in 1000), kidney failure [usually temporary] (1 in 500), bleeding (1 in 200), allergic reaction [possibly serious] (1 in 200). The patient understands and agrees to proceed      Runell Gess MD [facility], [facility] 12/09/2018 4:52 PM

## 2018-12-09 NOTE — Assessment & Plan Note (Signed)
Mr. Darren Haynes was referred to me by Dr. Hanley Hays for evaluation of symptomatic mitral stenosis.  There is no history of rheumatic fever as a child.  Has had increasing dyspnea on exertion over the last 6 months with recent 2D echo performed 11/21/2018 revealing severe MS with a peak gradient of 47, mean of 29 with a mitral valve area of 1.5 cm.  And a pulmonary artery pressure of 69 mmHg.  He was briefly tried on Cialis for pulmonary vasodilatation which resulted in marked hypotension.  He was on Lasix 40 mg a day which was dropped down to 20 mg by his PCP however he is more symptomatic at this dose.  He is also being worked up for connective tissue disease by rheumatologist.  I am going arrange for him to undergo radial/brachial right heart and left heart cath followed by transesophageal echo performed by Dr. Royann Shivers this coming Thursday.  The patient understands that risks included but are not limited to stroke (1 in 1000), death (1 in 1000), kidney failure [usually temporary] (1 in 500), bleeding (1 in 200), allergic reaction [possibly serious] (1 in 200). The patient understands and agrees to proceed

## 2018-12-09 NOTE — ED Provider Notes (Signed)
[facility] Emergency Department Provider Note MRN:  [id number]  Arrival date & time: 12/09/18     Chief Complaint   Chest pain History of Present Illness   Darren Haynes is a 33 y.o. year-old male with a history of mitral stenosis, CHF presenting to the ED with chief complaint of chest pain.  Patient was recently diagnosed with valvular heart disease, has a appointment with his surgeon later today.  Last night, patient had sudden worsening of his chronic back and chest pain.  Explains that he has chronic dull pain in the mid thoracic back in the central chest that is been present for a year.  The pain became much sharper last night while he was laying in bed.  Patient also endorsing bilateral squeezing leg pain, which is also chronic.  Denies headache or vision change, no abdominal pain, no shortness of breath.  Review of Systems  A complete 10 system review of systems was obtained and all systems are negative except as noted in the HPI and PMH.   Patient's Health History    Past Medical History:  Diagnosis Date  . Chronic back pain   . Panic attacks     Past Surgical History:  Procedure Laterality Date  . TYMPANOSTOMY TUBE PLACEMENT      Family History  Problem Relation Age of Onset  . Heart disease Mother   . Depression Mother   . Hypertension Father   . Hyperlipidemia Father     Social History   Socioeconomic History  . Marital status: Married    Spouse name: Not on file  . Number of children: Not on file  . Years of education: Not on file  . Highest education level: Not on file  Occupational History  . Not on file  Social Needs  . Financial resource strain: Not on file  . Food insecurity:    Worry: Not on file    Inability: Not on file  . Transportation needs:    Medical: Not on file    Non-medical: Not on file  Tobacco Use  . Smoking status: Never Smoker  . Smokeless tobacco: Current User    Types: Snuff  Substance and Sexual Activity    . Alcohol use: No  . Drug use: No  . Sexual activity: Not on file  Lifestyle  . Physical activity:    Days per week: Not on file    Minutes per session: Not on file  . Stress: Not on file  Relationships  . Social connections:    Talks on phone: Not on file    Gets together: Not on file    Attends religious service: Not on file    Active member of club or organization: Not on file    Attends meetings of clubs or organizations: Not on file    Relationship status: Not on file  . Intimate partner violence:    Fear of current or ex partner: Not on file    Emotionally abused: Not on file    Physically abused: Not on file    Forced sexual activity: Not on file  Other Topics Concern  . Not on file  Social History Narrative  . Not on file     Physical Exam  Vital Signs and Nursing Notes reviewed Vitals:   12/08/18 2347 12/09/18 0524  BP:  118/71  Pulse:  85  Resp:  12  Temp: 98.2 F (36.8 C)   SpO2:  100%    CONSTITUTIONAL: Well-appearing,  NAD NEURO:  Alert and oriented x 3, no focal deficits EYES:  eyes equal and reactive ENT/NECK:  no LAD, no JVD CARDIO: Regular rate, well-perfused, normal S1 and S2 PULM:  CTAB no wheezing or rhonchi GI/GU:  normal bowel sounds, non-distended, non-tender MSK/SPINE:  No gross deformities, no edema SKIN:  no rash, atraumatic PSYCH:  Appropriate speech and behavior  Diagnostic and Interventional Summary    EKG Interpretation  Date/Time:  Monday December 08 2018 23:42:50 EST Ventricular Rate:  105 PR Interval:  180 QRS Duration: 82 QT Interval:  348 QTC Calculation: 459 R Axis:   82 Text Interpretation:  Sinus tachycardia Biatrial enlargement Abnormal ECG Confirmed by Kennis Carina [phone]) on 12/09/2018 7:41:09 AM      Labs Reviewed  BASIC METABOLIC PANEL - Abnormal; Notable for the following components:      Result Value   Glucose, Bld 156 (*)    Calcium 8.8 (*)    All other components within normal limits  CBC - Abnormal;  Notable for the following components:   Hemoglobin 12.6 (*)    HCT 38.6 (*)    All other components within normal limits  D-DIMER, QUANTITATIVE (NOT AT [facility])  BRAIN NATRIURETIC PEPTIDE  I-STAT TROPONIN, ED    DG Chest 2 View  Final Result      Medications  sodium chloride flush (NS) 0.9 % injection 3 mL (has no administration in time range)     Procedures Critical Care  ED Course and Medical Decision Making  I have reviewed the triage vital signs and the nursing notes.  Pertinent labs & imaging results that were available during my care of the patient were reviewed by me and considered in my medical decision making (see below for details).  Patient is well-appearing, normal vital signs, no appreciable edema on exam, largely nontender extremities, no increased respiratory effort.  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  However, patient explains that he had a CT of the chest performed yesterday at [facility] imaging.  His symptoms did acutely worsen last night, after this imaging was obtained.  Still, it is thought to be unlikely that his chronic symptoms have become acutely emergent today, will screen with d-dimer and BNP to determine need for further assessment here in the emergency department.  EKG is unremarkable, troponin is negative.  D-dimer negative.  Patient feeling well, looking well, no indication to keep him here further, close follow-up later this afternoon with his surgeon.  After the discussed management above, the patient was determined to be safe for discharge.  The patient was in agreement with this plan and all questions regarding their care were answered.  ED return precautions were discussed and the patient will return to the ED with any significant worsening of condition.  Elmer Sow. Pilar Plate, MD [facility] Emergency Medicine [facility] [email or web address]  Final Clinical Impressions(s) / ED Diagnoses      ICD-10-CM   1. Chest pain, unspecified type R07.9   2. Pain in both lower extremities M79.604    M79.605     ED Discharge Orders    None         Sabas Sous, MD 12/09/18 [phone]

## 2018-12-09 NOTE — Patient Instructions (Addendum)
.     [facility] CARDIOVASCULAR DIVISION [facility] [address] [place] [address] [place] [address] Dept: [phone] Loc: [phone]  Taimur Feicht  12/09/2018  You are scheduled for a Cardiac Catheterization on Thursday, January 30 with Dr. Nanetta Batty.  1. Please arrive at the [facility] (Main Entrance A) at [facility]: [address] [place], [place] [address] at 7am ((This time is to allow for you to have your TEE (transesophageal echocardiogram) before your cardiac catheterization procedure)). Free valet parking service is available.   Special note: Every effort is made to have your procedure done on time. Please understand that emergencies sometimes delay scheduled procedures.  2. Diet: Do not eat solid foods after midnight.  The patient may have clear liquids until 5am upon the day of the procedure.  4. Medication instructions in preparation for your procedure:  On the morning of your procedure, DO NOT TAKE YOUR FUROSEMIDE. Take your Aspirin and your morning medicines.  You may use sips of water.  5. Plan for one night stay--bring personal belongings. 6. Bring a current list of your medications and current insurance cards. 7. You MUST have a responsible person to drive you home. 8. Someone MUST be with you the first 24 hours after you arrive home or your discharge will be delayed. 9. Please wear clothes that are easy to get on and off and wear slip-on shoes.  Thank you for allowing [place] to care for you!   -- [facility]   YOU WILL HAVE A TEE (transesophageal echocardiogram) PERFORMED BEFORE YOUR CARDIAC CATHETERIZATION. THESE PROCEDURES ARE ON THE SAME DAY  Your physician has requested that you have a TEE. During a TEE, sound waves are used to create images of your heart. It provides your doctor with information about the size and shape of your heart and how well your heart's chambers and  valves are working. In this test, a transducer is attached to the end of a flexible tube that's guided down your throat and into your esophagus (the tube leading from you mouth to your stomach) to get a more detailed image of your heart. You are not awake for the procedure. Please see the instruction sheet given to you today. For further information please visit [email or web address]

## 2018-12-10 ENCOUNTER — Telehealth: Payer: Self-pay | Admitting: Cardiovascular Disease

## 2018-12-11 ENCOUNTER — Ambulatory Visit (HOSPITAL_COMMUNITY)
Admission: RE | Admit: 2018-12-11 | Discharge: 2018-12-11 | Disposition: A | Discharge status: Home / Self Care | Payer: BLUE CROSS/BLUE SHIELD | Attending: Cardiovascular Disease | Admitting: Cardiovascular Disease

## 2018-12-11 ENCOUNTER — Ambulatory Visit (HOSPITAL_BASED_OUTPATIENT_CLINIC_OR_DEPARTMENT_OTHER): Payer: BLUE CROSS/BLUE SHIELD

## 2018-12-11 ENCOUNTER — Other Ambulatory Visit: Payer: Self-pay

## 2018-12-11 ENCOUNTER — Telehealth: Payer: Self-pay

## 2018-12-11 ENCOUNTER — Encounter (HOSPITAL_COMMUNITY)
Admission: RE | Disposition: A | Discharge status: Home / Self Care | Payer: Self-pay | Source: Home / Self Care | Attending: Cardiovascular Disease

## 2018-12-11 DIAGNOSIS — I050 Rheumatic mitral stenosis: Secondary | ICD-10-CM | POA: Diagnosis not present

## 2018-12-11 DIAGNOSIS — Z79899 Other long term (current) drug therapy: Secondary | ICD-10-CM | POA: Diagnosis not present

## 2018-12-11 DIAGNOSIS — R0609 Other forms of dyspnea: Secondary | ICD-10-CM | POA: Insufficient documentation

## 2018-12-11 DIAGNOSIS — Z7982 Long term (current) use of aspirin: Secondary | ICD-10-CM | POA: Insufficient documentation

## 2018-12-11 DIAGNOSIS — I340 Nonrheumatic mitral (valve) insufficiency: Secondary | ICD-10-CM | POA: Diagnosis not present

## 2018-12-11 DIAGNOSIS — I420 Dilated cardiomyopathy: Secondary | ICD-10-CM | POA: Diagnosis not present

## 2018-12-11 HISTORY — PX: TEE WITHOUT CARDIOVERSION: SHX5443

## 2018-12-11 HISTORY — PX: RIGHT/LEFT HEART CATH AND CORONARY ANGIOGRAPHY: CATH118266

## 2018-12-11 LAB — POCT I-STAT 7, (LYTES, BLD GAS, ICA,H+H)
Bicarbonate: 24.8 mmol/L (ref 20.0–28.0)
Calcium, Ion: 1.18 mmol/L (ref 1.15–1.40)
HCT: 35 % — ABNORMAL LOW (ref 39.0–52.0)
Hemoglobin: 11.9 g/dL — ABNORMAL LOW (ref 13.0–17.0)
O2 Saturation: 95 %
Potassium: 4.2 mmol/L (ref 3.5–5.1)
Sodium: 139 mmol/L (ref 135–145)
TCO2: 26 mmol/L (ref 22–32)
pCO2 arterial: 38.5 mmHg (ref 32.0–48.0)
pH, Arterial: 7.417 (ref 7.350–7.450)
pO2, Arterial: 76 mmHg — ABNORMAL LOW (ref 83.0–108.0)

## 2018-12-11 LAB — POCT I-STAT EG7
Bicarbonate: 25.9 mmol/L (ref 20.0–28.0)
Calcium, Ion: 1.21 mmol/L (ref 1.15–1.40)
HCT: 34 % — ABNORMAL LOW (ref 39.0–52.0)
Hemoglobin: 11.6 g/dL — ABNORMAL LOW (ref 13.0–17.0)
O2 Saturation: 66 %
Potassium: 4.2 mmol/L (ref 3.5–5.1)
Sodium: 140 mmol/L (ref 135–145)
TCO2: 27 mmol/L (ref 22–32)
pCO2, Ven: 44.7 mmHg (ref 44.0–60.0)
pH, Ven: 7.371 (ref 7.250–7.430)
pO2, Ven: 35 mmHg (ref 32.0–45.0)

## 2018-12-11 SURGERY — RIGHT/LEFT HEART CATH AND CORONARY ANGIOGRAPHY
Anesthesia: LOCAL

## 2018-12-11 SURGERY — ECHOCARDIOGRAM, TRANSESOPHAGEAL
Anesthesia: Moderate Sedation

## 2018-12-11 MED ORDER — SODIUM CHLORIDE 0.9 % IV SOLN: INTRAVENOUS | Status: AC

## 2018-12-11 MED ORDER — LIDOCAINE HCL (PF) 1 % IJ SOLN
INTRAMUSCULAR | Status: AC
  Filled 2018-12-11: qty 30

## 2018-12-11 MED ORDER — IOHEXOL 350 MG/ML SOLN
INTRAVENOUS | Status: DC | PRN
  Administered 2018-12-11: 30 mL via INTRACARDIAC

## 2018-12-11 MED ORDER — MIDAZOLAM HCL (PF) 10 MG/2ML IJ SOLN
INTRAMUSCULAR | Status: DC | PRN
  Administered 2018-12-11 (×2): 2 mg via INTRAVENOUS

## 2018-12-11 MED ORDER — MIDAZOLAM HCL (PF) 5 MG/ML IJ SOLN
INTRAMUSCULAR | Status: AC
  Filled 2018-12-11: qty 2

## 2018-12-11 MED ORDER — LIDOCAINE HCL (PF) 1 % IJ SOLN
INTRAMUSCULAR | Status: DC | PRN
  Administered 2018-12-11 (×2): 2 mL

## 2018-12-11 MED ORDER — SODIUM CHLORIDE 0.9 % IV SOLN
INTRAVENOUS | Status: DC
  Administered 2018-12-11: 10:00:00 via INTRAVENOUS

## 2018-12-11 MED ORDER — HEPARIN SODIUM (PORCINE) 1000 UNIT/ML IJ SOLN
INTRAMUSCULAR | Status: DC | PRN
  Administered 2018-12-11: 4000 [IU] via INTRAVENOUS

## 2018-12-11 MED ORDER — HEPARIN SODIUM (PORCINE) 1000 UNIT/ML IJ SOLN
INTRAMUSCULAR | Status: AC
  Filled 2018-12-11: qty 1

## 2018-12-11 MED ORDER — HEPARIN (PORCINE) IN NACL 1000-0.9 UT/500ML-% IV SOLN
INTRAVENOUS | Status: AC
  Filled 2018-12-11: qty 1000

## 2018-12-11 MED ORDER — SODIUM CHLORIDE 0.9% FLUSH: 3.0000 mL | INTRAVENOUS | Status: DC | PRN

## 2018-12-11 MED ORDER — VERAPAMIL HCL 2.5 MG/ML IV SOLN
INTRA_ARTERIAL | Status: DC | PRN
  Administered 2018-12-11: 11:00:00 via INTRA_ARTERIAL

## 2018-12-11 MED ORDER — DIPHENHYDRAMINE HCL 50 MG/ML IJ SOLN
INTRAMUSCULAR | Status: DC | PRN
  Administered 2018-12-11: 25 mg via INTRAVENOUS

## 2018-12-11 MED ORDER — ACETAMINOPHEN 325 MG PO TABS: 650.0000 mg | ORAL_TABLET | ORAL | Status: DC | PRN

## 2018-12-11 MED ORDER — VERAPAMIL HCL 2.5 MG/ML IV SOLN
INTRAVENOUS | Status: AC
  Filled 2018-12-11: qty 2

## 2018-12-11 MED ORDER — BUTAMBEN-TETRACAINE-BENZOCAINE 2-2-14 % EX AERO
INHALATION_SPRAY | CUTANEOUS | Status: DC | PRN
  Administered 2018-12-11: 2 via TOPICAL

## 2018-12-11 MED ORDER — DIPHENHYDRAMINE HCL 50 MG/ML IJ SOLN
INTRAMUSCULAR | Status: AC
  Filled 2018-12-11: qty 1

## 2018-12-11 MED ORDER — FENTANYL CITRATE (PF) 100 MCG/2ML IJ SOLN
INTRAMUSCULAR | Status: DC | PRN
  Administered 2018-12-11: 50 ug via INTRAVENOUS

## 2018-12-11 MED ORDER — SODIUM CHLORIDE 0.9% FLUSH: 3.0000 mL | Freq: Two times a day (BID) | INTRAVENOUS | Status: DC

## 2018-12-11 MED ORDER — SODIUM CHLORIDE 0.9 % IV SOLN: 250.0000 mL | INTRAVENOUS | Status: DC | PRN

## 2018-12-11 MED ORDER — HEPARIN (PORCINE) IN NACL 1000-0.9 UT/500ML-% IV SOLN
INTRAVENOUS | Status: DC | PRN
  Administered 2018-12-11 (×2): 500 mL

## 2018-12-11 MED ORDER — MORPHINE SULFATE (PF) 10 MG/ML IV SOLN: 2.0000 mg | INTRAVENOUS | Status: DC | PRN

## 2018-12-11 MED ORDER — NITROGLYCERIN 1 MG/10 ML FOR IR/CATH LAB
INTRA_ARTERIAL | Status: AC
  Filled 2018-12-11: qty 10

## 2018-12-11 MED ORDER — FENTANYL CITRATE (PF) 100 MCG/2ML IJ SOLN
INTRAMUSCULAR | Status: AC
  Filled 2018-12-11: qty 2

## 2018-12-11 MED ORDER — ONDANSETRON HCL 4 MG/2ML IJ SOLN: 4.0000 mg | Freq: Four times a day (QID) | INTRAMUSCULAR | Status: DC | PRN

## 2018-12-11 SURGICAL SUPPLY — 15 items
CATH BALLN WEDGE 5F 110CM (CATHETERS) ×2 IMPLANT
CATH INFINITI 5FR ANG PIGTAIL (CATHETERS) ×2 IMPLANT
CATH OPTITORQUE TIG 4.0 5F (CATHETERS) ×2 IMPLANT
DEVICE RAD COMP TR BAND LRG (VASCULAR PRODUCTS) ×2 IMPLANT
GLIDESHEATH SLEND A-KIT 6F 22G (SHEATH) ×2 IMPLANT
GUIDEWIRE INQWIRE 1.5J.035X260 (WIRE) ×1 IMPLANT
INQWIRE 1.5J .035X260CM (WIRE) ×2
KIT HEART LEFT (KITS) ×2 IMPLANT
PACK CARDIAC CATHETERIZATION (CUSTOM PROCEDURE TRAY) ×2 IMPLANT
SHEATH GLIDE SLENDER 4/5FR (SHEATH) ×2 IMPLANT
SHEATH PROBE COVER 6X72 (BAG) ×2 IMPLANT
TRANSDUCER W/STOPCOCK (MISCELLANEOUS) ×2 IMPLANT
TUBING CIL FLEX 10 FLL-RA (TUBING) ×2 IMPLANT
WIRE EMERALD 3MM-J .025X260CM (WIRE) ×2 IMPLANT
WIRE HI TORQ VERSACORE-J 145CM (WIRE) ×2 IMPLANT

## 2018-12-11 NOTE — Telephone Encounter (Signed)
Pt aware of procedure times for TEE and heart cath which are 1000 and 1300 respectively. Pt aware to arrive two hours early at [facility]  main entrance. Pt verbalized understanding

## 2018-12-11 NOTE — Progress Notes (Signed)
  Echocardiogram Echocardiogram Transesophageal has been performed.  Gerda Diss 12/11/2018, 10:56 AM

## 2018-12-11 NOTE — Interval H&P Note (Signed)
Cath Lab Visit (complete for each Cath Lab visit)  Clinical Evaluation Leading to the Procedure:   ACS: No.  Non-ACS:    Anginal Classification: CCS I  Anti-ischemic medical therapy: No Therapy  Non-Invasive Test Results: No non-invasive testing performed  Prior CABG: No previous CABG      History and Physical Interval Note:  12/11/2018 10:44 AM  Darren Haynes  has presented today for surgery, with the diagnosis of stenosis  The various methods of treatment have been discussed with the patient and family. After consideration of risks, benefits and other options for treatment, the patient has consented to  Procedure(s): RIGHT/LEFT HEART CATH AND CORONARY ANGIOGRAPHY (N/A) as a surgical intervention .  The patient's history has been reviewed, patient examined, no change in status, stable for surgery.  I have reviewed the patient's chart and labs.  Questions were answered to the patient's satisfaction.     Nanetta Batty

## 2018-12-11 NOTE — Progress Notes (Signed)
TRB removed, 2x2 gauze with tegaderm placed, armboard placed. Site unremarkable, will continue to monitor

## 2018-12-11 NOTE — Interval H&P Note (Signed)
History and Physical Interval Note:  12/11/2018 9:14 AM  Darren Haynes  has presented today for surgery, with the diagnosis of mitral stenosis  The various methods of treatment have been discussed with the patient and family. After consideration of risks, benefits and other options for treatment, the patient has consented to  Procedure(s): TRANSESOPHAGEAL ECHOCARDIOGRAM (TEE) (N/A) as a surgical intervention .  The patient's history has been reviewed, patient examined, no change in status, stable for surgery.  I have reviewed the patient's chart and labs.  Questions were answered to the patient's satisfaction.     Khyren Hing

## 2018-12-11 NOTE — Discharge Instructions (Signed)
Radial Site Care ° °This sheet gives you information about how to care for yourself after your procedure. Your health care provider may also give you more specific instructions. If you have problems or questions, contact your health care provider. °What can I expect after the procedure? °After the procedure, it is common to have: °· Bruising and tenderness at the catheter insertion area. °Follow these instructions at home: °Medicines °· Take over-the-counter and prescription medicines only as told by your health care provider. °Insertion site care °· Follow instructions from your health care provider about how to take care of your insertion site. Make sure you: °? Wash your hands with soap and water before you change your bandage (dressing). If soap and water are not available, use hand sanitizer. °? Change your dressing as told by your health care provider. °? Leave stitches (sutures), skin glue, or adhesive strips in place. These skin closures may need to stay in place for 2 weeks or longer. If adhesive strip edges start to loosen and curl up, you may trim the loose edges. Do not remove adhesive strips completely unless your health care provider tells you to do that. °· Check your insertion site every day for signs of infection. Check for: °? Redness, swelling, or pain. °? Fluid or blood. °? Pus or a bad smell. °? Warmth. °· Do not take baths, swim, or use a hot tub until your health care provider approves. °· You may shower 24-48 hours after the procedure, or as directed by your health care provider. °? Remove the dressing and gently wash the site with plain soap and water. °? Pat the area dry with a clean towel. °? Do not rub the site. That could cause bleeding. °· Do not apply powder or lotion to the site. °Activity ° °· For 24 hours after the procedure, or as directed by your health care provider: °? Do not flex or bend the affected arm. °? Do not push or pull heavy objects with the affected arm. °? Do not  drive yourself home from the hospital or clinic. You may drive 24 hours after the procedure unless your health care provider tells you not to. °? Do not operate machinery or power tools. °· Do not lift anything that is heavier than 10 lb (4.5 kg), or the limit that you are told, until your health care provider says that it is safe. °· Ask your health care provider when it is okay to: °? Return to work or school. °? Resume usual physical activities or sports. °? Resume sexual activity. °General instructions °· If the catheter site starts to bleed, raise your arm and put firm pressure on the site. If the bleeding does not stop, get help right away. This is a medical emergency. °· If you went home on the same day as your procedure, a responsible adult should be with you for the first 24 hours after you arrive home. °· Keep all follow-up visits as told by your health care provider. This is important. °Contact a health care provider if: °· You have a fever. °· You have redness, swelling, or yellow drainage around your insertion site. °Get help right away if: °· You have unusual pain at the radial site. °· The catheter insertion area swells very fast. °· The insertion area is bleeding, and the bleeding does not stop when you hold steady pressure on the area. °· Your arm or hand becomes pale, cool, tingly, or numb. °These symptoms may represent a serious problem   that is an emergency. Do not wait to see if the symptoms will go away. Get medical help right away. Call your local emergency services (911 in the [place] Do not drive yourself to the hospital. °Summary °· After the procedure, it is common to have bruising and tenderness at the site. °· Follow instructions from your health care provider about how to take care of your radial site wound. Check the wound every day for signs of infection. °· Do not lift anything that is heavier than 10 lb (4.5 kg), or the limit that you are told, until your health care provider says  that it is safe. °This information is not intended to replace advice given to you by your health care provider. Make sure you discuss any questions you have with your health care provider. °Document Released: 12/01/2010 Document Revised: 12/04/2017 Document Reviewed: 12/04/2017 °Elsevier Interactive Patient Education © 2019 [employer] ° °

## 2018-12-11 NOTE — Op Note (Signed)
INDICATIONS: mitral stenosis  PROCEDURE:   Informed consent was obtained prior to the procedure. The risks, benefits and alternatives for the procedure were discussed and the patient comprehended these risks.  Risks include, but are not limited to, cough, sore throat, vomiting, nausea, somnolence, esophageal and stomach trauma or perforation, bleeding, low blood pressure, aspiration, pneumonia, infection, trauma to the teeth and death.    After a procedural time-out, the oropharynx was anesthetized with 20% benzocaine spray.   During this procedure the patient was administered a total of Versed 4 mg and Fentanyl 50 mcg and diphenhydramine 25 mg IV to achieve and maintain moderate conscious sedation.  The patient's heart rate, blood pressure, and oxygen saturationweare monitored continuously during the procedure. The period of conscious sedation was 12 minutes, of which I was present face-to-face 100% of this time.  The transesophageal probe was inserted in the esophagus and stomach without difficulty and multiple views were obtained.  The patient was kept under observation until the patient left the procedure room.  The patient left the procedure room in stable condition.   Agitated microbubble saline contrast was not administered.  COMPLICATIONS:    There were no immediate complications.  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  Mean gradient 15 mm Hg at bpm. MVA 0.8 cm sq. The leaflets remain flexible, there is no calcification, there is moderate chordal fusion. Good candidate for balloon valvuloplasty. Other valves are not involved. Dilated left atrium. Normal LV.  RECOMMENDATIONS:    Refer for balloon valvuloplasty.  Time Spent Directly with the Patient:  45 minutes   Darren Haynes 12/11/2018, 10:30 AM

## 2018-12-12 ENCOUNTER — Encounter (HOSPITAL_COMMUNITY): Payer: Self-pay | Admitting: Cardiovascular Disease

## 2018-12-19 ENCOUNTER — Telehealth: Payer: Self-pay | Admitting: *Deleted

## 2018-12-19 NOTE — Telephone Encounter (Signed)
Hey Dr Allyson Sabal I was wondering if pain while walking in my right calf could have anything to do with a possible blood clot. The reason I ask I have barely been walking since the past heart cath we did last week and am pretty sure it's not a pulled muscle. Any suggestions on what I should do or should I even be concerned I went to [facility] today to meet with dr Regino Schultze and he wasn't to concerned cause it wasn't from the valve thanks I appreciate your time sir.  Olegario Doctor   DOB 1985-11-21  [id number]

## 2018-12-19 NOTE — Telephone Encounter (Signed)
LM TO CALL BACK ./CY 

## 2018-12-23 NOTE — Telephone Encounter (Signed)
This was a radial/brachial right and left heart cath.  Had nothing to do with his lower extremities so I doubt that they are related.

## 2018-12-23 NOTE — Telephone Encounter (Signed)
Mychart message with Dr. Hazle CocaBerry's response and recommendation to seek PCP for further evaluation sent to pt via mychart

## 2018-12-24 HISTORY — PX: BALLOON VALVULOPLASTY: SHX5741

## 2018-12-31 ENCOUNTER — Ambulatory Visit (INDEPENDENT_AMBULATORY_CARE_PROVIDER_SITE_OTHER): Payer: BLUE CROSS/BLUE SHIELD | Admitting: Cardiovascular Disease

## 2018-12-31 ENCOUNTER — Encounter: Payer: Self-pay | Admitting: Cardiovascular Disease

## 2018-12-31 VITALS — BP 110/78 | HR 98 | Ht 74.0 in | Wt 187.0 lb

## 2018-12-31 DIAGNOSIS — I050 Rheumatic mitral stenosis: Secondary | ICD-10-CM | POA: Diagnosis not present

## 2018-12-31 DIAGNOSIS — Z9889 Other specified postprocedural states: Secondary | ICD-10-CM

## 2018-12-31 NOTE — Assessment & Plan Note (Signed)
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  He had a transesophageal echo performed Dr. Royann Shivers who demonstrated this nicely and a right left heart cath by me which confirmed his physiology.  I referred him to Dr. Regino Schultze at [facility] who performed mitral valvuloplasty successfully with marked improvement in his pulmonary artery pressures.  He feels clinically improved.  We will repeat a 2D echo as his new baseline and follow this on annual basis.

## 2018-12-31 NOTE — Progress Notes (Signed)
12/31/2018 [name]   [birth year]  [id number]  Primary Physician Center, [facility] Primary Cardiologist: Runell Gess MD Milagros Loll, [place], [place]  HPI:  Darren Haynes is a 33 y.o.  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 He was referred by Dr. Hanley Hays for evaluation of symptomatic mitral stenosis.  I last saw him in the office right before his heart catheterization 12/11/2018.  Tere is no history of rheumatic fever. He has no other cardiac risk factors. He is had progressive dyspnea for the last 9 months with some nonspecific aches and pains. Is being worked up by rheumatologist. Echo performed 11/21/2018 revealed normal LV systolic function, a peak mitral gradient of 47 mmHg with a mean of 29 mmHg and mitral valve area 1.5 cm with a PA pressure of 69 mmHg. He does have severe dyspnea and lower extreme edema. He is on oral diuretic.  He presented today for transesophageal echocardiogram performed Dr. Royann Shivers which showed classic rheumatic mitral stenosis with a mean gradient of 15 mmHg.  He presents now for right left heart cath to define his anatomy and physiology.  I did right left heart cath revealing a significant mitral valve gradient and ultimately referred him to Dr. Regino Schultze at [facility] who performed successful mitral balloon valvuloplasty with almost immediate improvement in his pulmonary artery pressures.  He feels clinically improved.  Current Meds  Medication Sig  . albuterol (PROVENTIL) (2.5 MG/3ML) 0.083% nebulizer solution Take 2.5 mg by nebulization every 6 (six) hours as needed for wheezing or shortness of breath.  . ALPRAZolam (XANAX) 0.25 MG tablet Take 0.25 mg by mouth 3 (three) times daily as needed for anxiety.  [name] Kitchen amoxicillin (AMOXIL) 500 MG capsule Take 500 mg by mouth 2 (two) times daily.  [name] Kitchen aspirin EC 81 MG tablet Take 81 mg by  mouth daily.  . Buprenorphine HCl (BELBUCA) 300 MCG FILM Place 450 mcg inside cheek 2 (two) times daily.   [name] Kitchen etodolac (LODINE XL) 400 MG 24 hr tablet Take 400 mg by mouth daily.  . fluticasone-salmeterol (ADVAIR HFA) 45-21 MCG/ACT inhaler Inhale 2 puffs into the lungs 2 (two) times daily.  . furosemide (LASIX) 40 MG tablet Take 1 tablet (40 mg total) by mouth daily. DO NOT TAKE ON THE DAY OF YOUR PROCEDURE ON 12/11/2018  . gabapentin (NEURONTIN) 300 MG capsule Take 300 mg by mouth 5 (five) times daily.   [name] Kitchen HYDROcodone-acetaminophen (NORCO) 10-325 MG tablet Take 1 tablet by mouth 3 (three) times daily.   . metaxalone (SKELAXIN) 800 MG tablet Take 800 mg by mouth 4 (four) times daily.  . nortriptyline (PAMELOR) 25 MG capsule Take 25 mg by mouth at bedtime.   . predniSONE (DELTASONE) 10 MG tablet Take by mouth.  . sertraline (ZOLOFT) 50 MG tablet Take 50 mg by mouth daily.  . temazepam (RESTORIL) 7.5 MG capsule Take 7.5 mg by mouth at bedtime as needed.  . Vitamin D, Ergocalciferol, (DRISDOL) 1.25 MG (50000 UT) CAPS capsule Take 50,000 Units by mouth every Wednesday.      No Known Allergies  Social History   Socioeconomic History  . Marital status: Married    Spouse name: Not on file  . Number of children: Not on file  . Years of education: Not on file  . Highest education level: Not on file  Occupational History  . Not on file  Social Needs  . Financial resource strain: Not on file  . Food insecurity:    Worry: Not on file    Inability: Not on file  . Transportation needs:    Medical: Not on file    Non-medical: Not on file  Tobacco Use  . Smoking status: Never Smoker  . Smokeless tobacco: Current User    Types: Snuff  Substance and Sexual Activity  . Alcohol use: No  . Drug use: No  . Sexual activity: Not on file  Lifestyle  . Physical activity:    Days per week: Not on file    Minutes per session: Not on file  . Stress: Not on file  Relationships  . Social connections:     Talks on phone: Not on file    Gets together: Not on file    Attends religious service: Not on file    Active member of club or organization: Not on file    Attends meetings of clubs or organizations: Not on file    Relationship status: Not on file  . Intimate partner violence:    Fear of current or ex partner: Not on file    Emotionally abused: Not on file    Physically abused: Not on file    Forced sexual activity: Not on file  Other Topics Concern  . Not on file  Social History Narrative  . Not on file     Review of Systems: General: negative for chills, fever, night sweats or weight changes.  Cardiovascular: negative for chest pain, dyspnea on exertion, edema, orthopnea, palpitations, paroxysmal nocturnal dyspnea or shortness of breath Dermatological: negative for rash Respiratory: negative for cough or wheezing Urologic: negative for hematuria Abdominal: negative for nausea, vomiting, diarrhea, bright red blood per rectum, melena, or hematemesis Neurologic: negative for visual changes, syncope, or dizziness All other systems reviewed and are otherwise negative except as noted above.    Blood pressure 110/78, pulse 98, height 6\' 2"  (1.88 m), weight 187 lb (84.8 kg).  General appearance: alert and no distress Neck: no adenopathy, no carotid bruit, no JVD, supple, symmetrical, trachea midline and thyroid not enlarged, symmetric, no tenderness/mass/nodules Lungs: clear to auscultation bilaterally Heart: regular rate and rhythm, S1, S2 normal, no murmur, click, rub or gallop Extremities: extremities normal, atraumatic, no cyanosis or edema Pulses: 2+ and symmetric Skin: Skin color, texture, turgor normal. No rashes or lesions Neurologic: Alert and oriented X 3, normal strength and tone. Normal symmetric reflexes. 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  He had a transesophageal echo performed Dr. Royann Shiversroitoru who demonstrated this nicely and a right left heart cath by me which confirmed his physiology.  I [facility] who performed mitral valvuloplasty successfully with marked improvement in his pulmonary artery pressures.  He feels clinically improved.  We will repeat a 2D echo as his new baseline and follow this on annual basis.      Runell GessJonathan J. Kellyann Ordway MD FACP,FACC,FAHA, [facility] 12/31/2018 4:09 PM

## 2018-12-31 NOTE — Addendum Note (Signed)
Addended by: Harlow Asa on: 12/31/2018 04:19 PM   Modules accepted: Orders

## 2018-12-31 NOTE — Patient Instructions (Addendum)
Medication Instructions:  Your physician recommends that you continue on your current medications as directed. Please refer to the Current Medication list given to you today.  If you need a refill on your cardiac medications before your next appointment, please call your pharmacy.   Lab work: NONE If you have labs (blood work) drawn today and your tests are completely normal, you will receive your results only by: [name] Kitchen MyChart Message (if you have MyChart) OR . A paper copy in the mail If you have any lab test that is abnormal or we need to change your treatment, we will call you to review the results.  Testing/Procedures: Your physician has requested that you have an echocardiogram. Echocardiography is a painless test that uses sound waves to create images of your heart. It provides your doctor with information about the size and shape of your heart and how well your heart's chambers and valves are working. This procedure takes approximately one hour. There are no restrictions for this procedure.  SCHEDULE July/August 2020  Follow-Up: At [facility], you and your health needs are our priority.  As part of our continuing mission to provide you with exceptional heart care, we have created designated Provider Care Teams.  These Care Teams include your primary Cardiologist (physician) and Advanced Practice Providers (APPs -  Physician Assistants and Nurse Practitioners) who all work together to provide you with the care you need, when you need it. . You will need a follow up appointment in 6 months.  Please call our office 2 months in advance to schedule this appointment.  You may see Dr. Allyson Sabal or one of the following Advanced Practice Providers on your designated Care Team:   . Corine Shelter, [place] . Azalee Course, PA-C . Micah Flesher, PA-C . Joni Reining, DNP . Theodore Demark, PA-C . Judy Pimple, PA-C . Marjie Skiff, PA-C

## 2019-01-01 ENCOUNTER — Ambulatory Visit (HOSPITAL_COMMUNITY)
Admission: EM | Admit: 2019-01-01 | Discharge: 2019-01-01 | Disposition: A | Discharge status: Home / Self Care | Payer: BLUE CROSS/BLUE SHIELD | Source: Home / Self Care

## 2019-01-01 ENCOUNTER — Other Ambulatory Visit: Payer: Self-pay

## 2019-01-01 ENCOUNTER — Encounter (HOSPITAL_COMMUNITY): Payer: Self-pay

## 2019-01-01 ENCOUNTER — Emergency Department (HOSPITAL_COMMUNITY): Payer: BLUE CROSS/BLUE SHIELD

## 2019-01-01 ENCOUNTER — Emergency Department (HOSPITAL_COMMUNITY)
Admission: EM | Admit: 2019-01-01 | Discharge: 2019-01-01 | Discharge status: Against Medical Advice | Payer: BLUE CROSS/BLUE SHIELD | Attending: Emergency Medicine | Admitting: Emergency Medicine

## 2019-01-01 DIAGNOSIS — Z79899 Other long term (current) drug therapy: Secondary | ICD-10-CM | POA: Diagnosis not present

## 2019-01-01 DIAGNOSIS — R002 Palpitations: Secondary | ICD-10-CM | POA: Diagnosis present

## 2019-01-01 DIAGNOSIS — F17290 Nicotine dependence, other tobacco product, uncomplicated: Secondary | ICD-10-CM | POA: Insufficient documentation

## 2019-01-01 DIAGNOSIS — Z7982 Long term (current) use of aspirin: Secondary | ICD-10-CM | POA: Insufficient documentation

## 2019-01-01 DIAGNOSIS — R10816 Epigastric abdominal tenderness: Secondary | ICD-10-CM | POA: Diagnosis not present

## 2019-01-01 DIAGNOSIS — Z5320 Procedure and treatment not carried out because of patient's decision for unspecified reasons: Secondary | ICD-10-CM | POA: Insufficient documentation

## 2019-01-01 LAB — COMPREHENSIVE METABOLIC PANEL
ALT: 61 U/L — ABNORMAL HIGH (ref 0–44)
AST: 30 U/L (ref 15–41)
Albumin: 4.5 g/dL (ref 3.5–5.0)
Alkaline Phosphatase: 45 U/L (ref 38–126)
Anion gap: 8 (ref 5–15)
BUN: 15 mg/dL (ref 6–20)
CO2: 27 mmol/L (ref 22–32)
Calcium: 9.5 mg/dL (ref 8.9–10.3)
Chloride: 102 mmol/L (ref 98–111)
Creatinine, Ser: 1.18 mg/dL (ref 0.61–1.24)
GFR calc Af Amer: >60 mL/min (ref >60)
GFR calc non Af Amer: >60 mL/min (ref >60)
Glucose, Bld: 86 mg/dL (ref 70–99)
Potassium: 3.9 mmol/L (ref 3.5–5.1)
Sodium: 137 mmol/L (ref 135–145)
Total Bilirubin: 0.5 mg/dL (ref 0.3–1.2)
Total Protein: 7.5 g/dL (ref 6.5–8.1)

## 2019-01-01 LAB — CBC WITH DIFFERENTIAL/PLATELET
Abs Immature Granulocytes: 0.03 10*3/uL (ref 0.00–0.07)
Basophils Absolute: 0 10*3/uL (ref 0.0–0.1)
Basophils Relative: 0 %
Eosinophils Absolute: 0 10*3/uL (ref 0.0–0.5)
Eosinophils Relative: 0 %
HCT: 42.2 % (ref 39.0–52.0)
Hemoglobin: 14.2 g/dL (ref 13.0–17.0)
Immature Granulocytes: 0 %
Lymphocytes Relative: 8 %
Lymphs Abs: 0.6 10*3/uL — ABNORMAL LOW (ref 0.7–4.0)
MCH: 29.8 pg (ref 26.0–34.0)
MCHC: 33.6 g/dL (ref 30.0–36.0)
MCV: 88.7 fL (ref 80.0–100.0)
Monocytes Absolute: 0.4 10*3/uL (ref 0.1–1.0)
Monocytes Relative: 5 %
Neutro Abs: 6.5 10*3/uL (ref 1.7–7.7)
Neutrophils Relative %: 87 %
Platelets: 223 10*3/uL (ref 150–400)
RBC: 4.76 MIL/uL (ref 4.22–5.81)
RDW: 13.3 % (ref 11.5–15.5)
WBC: 7.6 10*3/uL (ref 4.0–10.5)
nRBC: 0 % (ref 0.0–0.2)

## 2019-01-01 LAB — I-STAT TROPONIN, ED: Troponin i, poc: 0 ng/mL (ref 0.00–0.08)

## 2019-01-01 NOTE — ED Notes (Signed)
Pt placed on a portable monitor, pulse ox and dinomap

## 2019-01-01 NOTE — ED Notes (Signed)
Pt angry, demanded IV out.  RN attempted to de-esclate situation.  Requested we get 2nd trop level. And discharge vitals, refused.  RN removed IV.  Provider back at bedside.

## 2019-01-01 NOTE — ED Notes (Signed)
ED Provider at bedside. 

## 2019-01-01 NOTE — ED Notes (Signed)
Notified dr hagler of patient.  Patient having chest tightness and irregular heart beat.  Patient states unable to contact his pcp  Offered to [employer], but patient opted to go to ed.

## 2019-01-01 NOTE — ED Triage Notes (Signed)
Pt arrives POV for eval of palpitations and "stabbing chest pains". Pt reports he recently had a mitral valvuloplasty last week, f/u w/ Gery Pray yesterday w/ no complaints. Reports palpitation episodes today as well as increased SOB and stabbing CP. Did not report complaints to [facility] yesterday because they were not present. States CP 8/10. Reports hx of rheumatic heart disease

## 2019-01-01 NOTE — ED Notes (Signed)
Pt was leaving, RN caught up to pt and gave AMA paperwork and attempted once again to de-esclate pt.  Pt stated he was leaving.  Pt. Experience phone number was given to pt per his request.  Charge RN notified of situation

## 2019-01-01 NOTE — ED Provider Notes (Signed)
[facility] Provider Note   CSN: [id number] Arrival date & time: 01/01/19  1844    History   Chief Complaint Chief Complaint  Patient presents with  . Palpitations    HPI Darren Haynes is a 33 y.o. male.      Palpitations  Palpitations quality:  Regular Onset quality:  Sudden Duration:  2 hours Timing:  Constant Progression:  Resolved Chronicity:  Recurrent Context: not caffeine, not dehydration, not exercise, not hyperventilation, not illicit drugs, not nicotine and not stimulant use   Relieved by:  None tried Worsened by:  Nothing Ineffective treatments:  None tried Associated symptoms: no back pain, no chest pain (Patient denies any chest pain even though this was listed in the triage note.  Indicates that the pain was in his belly and not his chest.), no cough, no lower extremity edema, no malaise/fatigue, no numbness, no shortness of breath, no syncope, no vomiting and no weakness   Risk factors: heart disease (Mitral valve stenosis)   Risk factors: no hx of atrial fibrillation and no hx of PE     Past Medical History:  Diagnosis Date  . Chronic back pain   . Panic attacks     Patient Active Problem List   Diagnosis Date Noted  . Mitral stenosis 12/09/2018    Past Surgical History:  Procedure Laterality Date  . RIGHT/LEFT HEART CATH AND CORONARY ANGIOGRAPHY N/A 12/11/2018   Procedure: RIGHT/LEFT HEART CATH AND CORONARY ANGIOGRAPHY;  Surgeon: Runell GessBerry, Jonathan J, MD;  Location: [facility] INVASIVE CV LAB;  Service: Cardiovascular;  Laterality: N/A;  . TEE WITHOUT CARDIOVERSION N/A 12/11/2018   Procedure: TRANSESOPHAGEAL ECHOCARDIOGRAM (TEE);  Surgeon: Thurmon Fairroitoru, Mihai, MD;  [facility];  Service: Cardiovascular;  Laterality: N/A;  . TYMPANOSTOMY TUBE PLACEMENT          Home Medications    Prior to Admission medications   Medication Sig Start Date End Date Taking? Authorizing Provider  albuterol (PROVENTIL) (2.5  MG/3ML) 0.083% nebulizer solution Take 2.5 mg by nebulization every 6 (six) hours as needed for wheezing or shortness of breath.   Yes [provider]  ALPRAZolam (XANAX) 0.25 MG tablet Take 0.25 mg by mouth 3 (three) times daily as needed for anxiety.   Yes [provider]  amoxicillin (AMOXIL) 500 MG capsule Take 500 mg by mouth 2 (two) times daily. For 20 days 12/30/18  Yes [provider]  aspirin EC 81 MG tablet Take 81 mg by mouth daily.   Yes [provider]  Buprenorphine HCl (BELBUCA) 300 MCG FILM Place 450 mcg inside cheek 2 (two) times daily.    Yes [provider]  etodolac (LODINE XL) 400 MG 24 hr tablet Take 400 mg by mouth daily.   Yes [provider]  fluticasone-salmeterol (ADVAIR HFA) 45-21 MCG/ACT inhaler Inhale 2 puffs into the lungs 2 (two) times daily.   Yes [provider]  furosemide (LASIX) 40 MG tablet Take 1 tablet (40 mg total) by mouth daily. DO NOT TAKE ON THE DAY OF YOUR PROCEDURE ON 12/11/2018 12/09/18 03/09/19 Yes Runell GessBerry, Jonathan J, MD  gabapentin (NEURONTIN) 300 MG capsule Take 300 mg by mouth 5 (five) times daily.    Yes [provider]  HYDROcodone-acetaminophen (NORCO) 10-325 MG tablet Take 1 tablet by mouth 3 (three) times daily.    Yes [provider]  metaxalone (SKELAXIN) 800 MG tablet Take 800 mg by mouth 4 (four) times daily.   Yes [provider]  nortriptyline (PAMELOR) 25 MG capsule Take 25 mg by mouth at bedtime.  10/24/18  Yes [provider]  potassium chloride (K-DUR,KLOR-CON) 10 MEQ tablet Take 10 mEq by mouth daily.   Yes [provider]  predniSONE (DELTASONE) 10 MG tablet Take 5-30 mg by mouth See admin instructions. Take 30 mg by mouth for 7 days, 20 mg for 7 days, 10 mg for 7 days, 5 mg by mouth daily 12/31/18 02/10/19 Yes [provider]  sertraline (ZOLOFT) 50 MG tablet Take 50 mg by mouth daily.   Yes [provider]    temazepam (RESTORIL) 7.5 MG capsule Take 7.5 mg by mouth at bedtime as needed for sleep.  09/18/18  Yes [provider]  Vitamin D, Ergocalciferol, (DRISDOL) 1.25 MG (50000 UT) CAPS capsule Take 50,000 Units by mouth every Wednesday.    Yes [provider]    Family History Family History  Problem Relation Age of Onset  . Heart disease Mother   . Depression Mother   . Hypertension Father   . Hyperlipidemia Father     Social History Social History   Tobacco Use  . Smoking status: Never Smoker  . Smokeless tobacco: Current User    Types: Snuff  Substance Use Topics  . Alcohol use: No  . Drug use: No     Allergies   Patient has no known allergies.   Review of Systems Review of Systems  Constitutional: Negative for chills, fever and malaise/fatigue.  HENT: Negative for ear pain and sore throat.   Eyes: Negative for pain and visual disturbance.  Respiratory: Negative for cough and shortness of breath.   Cardiovascular: Positive for palpitations. Negative for chest pain (Patient denies any chest pain even though this was listed in the triage note.  Indicates that the pain was in his belly and not his chest.) and syncope.  Gastrointestinal: Negative for abdominal pain and vomiting.  Genitourinary: Negative for dysuria and hematuria.  Musculoskeletal: Negative for arthralgias and back pain.  Skin: Negative for color change and rash.  Neurological: Negative for seizures, syncope, weakness and numbness.  All other systems reviewed and are negative.    Physical Exam Updated Vital Signs BP 129/70   Pulse 67   Temp 98.4 F (36.9 C) (Oral)   Resp 19   Ht  (1.88 m)   Wt 84.8 kg   SpO2 100%   BMI 24.01 kg/m   Physical Exam Vitals signs and nursing note reviewed.  Constitutional:      Appearance: He is well-developed.     Comments: Patient resting comfortably, no acute distress.  HENT:     Head: Normocephalic and atraumatic.  Eyes:      Conjunctiva/sclera: Conjunctivae normal.  Neck:     Musculoskeletal: Neck supple.  Cardiovascular:     Rate and Rhythm: Normal rate and regular rhythm.     Heart sounds: No murmur.  Pulmonary:     Effort: Pulmonary effort is normal. No respiratory distress.     Breath sounds: Normal breath sounds.  Abdominal:     General: There is no distension.     Palpations: Abdomen is soft. There is no mass.     Tenderness: There is abdominal tenderness (Mild generalized tenderness). There is no guarding or rebound.     Hernia: No hernia is present.  Musculoskeletal: Normal range of motion.  Skin:    General: Skin is warm and dry.     Capillary Refill: Capillary refill takes less than 2  seconds.  Neurological:     General: No focal deficit present.     Mental Status: He is alert.  Psychiatric:        Mood and Affect: Mood normal.      ED Treatments / Results  Labs (all labs ordered are listed, but only abnormal results are displayed) Labs Reviewed  COMPREHENSIVE METABOLIC PANEL - Abnormal; Notable for the following components:      Result Value   ALT 61 (*)    All other components within normal limits  CBC WITH DIFFERENTIAL/PLATELET - Abnormal; Notable for the following components:   Lymphs Abs 0.6 (*)    All other components within normal limits  I-STAT TROPONIN, ED  I-STAT TROPONIN, ED    EKG EKG Interpretation  Date/Time:  Thursday January 01 2019 20:00:52 EST Ventricular Rate:  70 PR Interval:    QRS Duration: 86 QT Interval:  390 QTC Calculation: 421 R Axis:   46 Text Interpretation:  Sinus rhythm Probable left atrial enlargement Probable inferior infarct, old Nonspecific TW changes Confirmed by Alvira Monday ([id number]) on 01/01/2019 8:38:00 PM   Radiology Dg Chest Portable 1 View  Result Date: 01/01/2019 CLINICAL DATA:  34 y/o  M; stabbing chest pains and palpitations. EXAM: PORTABLE CHEST 1 VIEW COMPARISON:  12/08/2018 chest radiograph FINDINGS: Stable heart size  and mediastinal contours are within normal limits. Both lungs are clear. The visualized skeletal structures are unremarkable. IMPRESSION: No acute pulmonary process identified. Electronically Signed   By: Mitzi Hansen M.D.   On: 01/01/2019 20:06    Procedures Procedures (including critical care time)  Medications Ordered in ED Medications - No data to display   Initial Impression / Assessment and Plan / ED Course  I have reviewed the triage vital signs and the nursing notes.  Pertinent labs & imaging results that were available during my care of the patient were reviewed by me and considered in my medical decision making (see chart for details).         33 year old male patient with significant past medical history listed above including a procedure performed by Dr. Regino Schultze at [facility] who performed successful mitral balloon valvuloplasty with almost immediate improvement in his pulmonary artery pressures.  Patient denies any significant chest pain, infectious-like symptoms however indicates that he had resolution of symptoms upon arrival to the emergency department.  Patient hemodynamically stable, resting comfortably in the bed.  EKG shows normal sinus rhythm, no signs of ST elevation or depression, new flipped T wave in V3, no other signs of ischemia, appropriate intervals.  Will perform laboratory studies to assess for ischemia, as well as perform chest x-ray to assess for pneumonia and other cardiopulmonary etiologies.  Laboratory studies do not indicate any electrolyte abnormality which would indicate unstable myocardium, negative troponin.  Physical exam is to indicate worsening edema, appropriate heart sounds, no significant murmur.  Patient moved to the hallway bed and later became frustrated that he was in the hallway.  Patient indicated he would not wait for a delta troponin and that he wanted to speak to a cardiologist.  Long discussion with the patient regarding  appropriate work-up and management for palpitation and that we needed initial work-up before discussing with cardiology if necessary.  Patient however did not want to wait and left AGAINST MEDICAL ADVICE.  Patient indicated that he was going to call his cardiologist tomorrow.  Patient encouraged to stay that a delta troponin would be helpful to differentiate whether his palpitations were possibly related to  ischemia particularly in the setting of his EKG. however patient disagreed left the emergency department.  The above care was discussed and agreed upon by my attending physician.  Final Clinical Impressions(s) / ED Diagnoses   Final diagnoses:  Palpitations    ED Discharge Orders    None       Dahlia Client, MD 01/02/19 Abbie Sons, MD 01/03/19 [phone]

## 2019-01-02 ENCOUNTER — Telehealth (HOSPITAL_COMMUNITY): Payer: Self-pay

## 2019-01-02 NOTE — Telephone Encounter (Signed)
Progress notes signed by DM faxed to MD [facility], confirmation fax received.

## 2019-01-06 ENCOUNTER — Telehealth: Payer: Self-pay | Admitting: Cardiovascular Disease

## 2019-01-06 DIAGNOSIS — R002 Palpitations: Secondary | ICD-10-CM

## 2019-01-06 NOTE — Telephone Encounter (Signed)
Spoke with patient and he has been having issues with his heart feeling like it is skipping/feeling strange and fast at times. He tends to feel more at rest and unable to sleep. Patient did go to ED 2/20 however he left because he became frustrated. He also held his Furosemide yesterday because he felt he could not urinate, better after holding for a day. No available appointments. Will forward to Dr Allyson Sabal for review

## 2019-01-07 NOTE — Telephone Encounter (Signed)
Get a two-week ZIO patch and then arranged to see me back after that

## 2019-01-09 DIAGNOSIS — R002 Palpitations: Secondary | ICD-10-CM

## 2019-01-09 DIAGNOSIS — I050 Rheumatic mitral stenosis: Secondary | ICD-10-CM

## 2019-01-12 NOTE — Telephone Encounter (Signed)
Spoke with pt and made aware that Dr. Allyson Sabal recommended Zio patch and will receive call from scheduling to schedule appt at [facility] for Zio patch placement and schedule ROV with Dr. Allyson Sabal. Pt verbalized understanding

## 2019-01-15 ENCOUNTER — Telehealth: Payer: Self-pay

## 2019-01-15 NOTE — Telephone Encounter (Signed)
Face-to-face consult with Dr. Allyson Sabal for pt who requests cardiac rehab referral. Dr. Allyson Sabal okay with referral. Order in Epic and routed to scheduling

## 2019-01-20 DIAGNOSIS — F17200 Nicotine dependence, unspecified, uncomplicated: Secondary | ICD-10-CM | POA: Insufficient documentation

## 2019-01-26 ENCOUNTER — Emergency Department (HOSPITAL_COMMUNITY): Payer: BLUE CROSS/BLUE SHIELD

## 2019-01-26 ENCOUNTER — Other Ambulatory Visit: Payer: Self-pay

## 2019-01-26 ENCOUNTER — Emergency Department (HOSPITAL_COMMUNITY)
Admission: EM | Admit: 2019-01-26 | Discharge: 2019-01-26 | Disposition: A | Discharge status: Home / Self Care | Payer: BLUE CROSS/BLUE SHIELD | Attending: Emergency Medicine | Admitting: Emergency Medicine

## 2019-01-26 ENCOUNTER — Encounter (HOSPITAL_COMMUNITY): Payer: Self-pay | Admitting: *Deleted

## 2019-01-26 DIAGNOSIS — F17290 Nicotine dependence, other tobacco product, uncomplicated: Secondary | ICD-10-CM | POA: Insufficient documentation

## 2019-01-26 DIAGNOSIS — R002 Palpitations: Secondary | ICD-10-CM

## 2019-01-26 DIAGNOSIS — Z7982 Long term (current) use of aspirin: Secondary | ICD-10-CM | POA: Insufficient documentation

## 2019-01-26 DIAGNOSIS — Z79899 Other long term (current) drug therapy: Secondary | ICD-10-CM | POA: Diagnosis not present

## 2019-01-26 DIAGNOSIS — R0789 Other chest pain: Secondary | ICD-10-CM

## 2019-01-26 LAB — COMPREHENSIVE METABOLIC PANEL
ALT: 28 U/L (ref 0–44)
AST: 13 U/L — ABNORMAL LOW (ref 15–41)
Albumin: 3.8 g/dL (ref 3.5–5.0)
Alkaline Phosphatase: 48 U/L (ref 38–126)
Anion gap: 9 (ref 5–15)
BUN: 26 mg/dL — ABNORMAL HIGH (ref 6–20)
CO2: 25 mmol/L (ref 22–32)
Calcium: 8.5 mg/dL — ABNORMAL LOW (ref 8.9–10.3)
Chloride: 104 mmol/L (ref 98–111)
Creatinine, Ser: 1.14 mg/dL (ref 0.61–1.24)
GFR calc Af Amer: >60 mL/min (ref >60)
GFR calc non Af Amer: >60 mL/min (ref >60)
Glucose, Bld: 94 mg/dL (ref 70–99)
Potassium: 3.7 mmol/L (ref 3.5–5.1)
Sodium: 138 mmol/L (ref 135–145)
Total Bilirubin: 0.2 mg/dL — ABNORMAL LOW (ref 0.3–1.2)
Total Protein: 6.8 g/dL (ref 6.5–8.1)

## 2019-01-26 LAB — CBC WITH DIFFERENTIAL/PLATELET
Abs Immature Granulocytes: 0.06 10*3/uL (ref 0.00–0.07)
Basophils Absolute: 0 10*3/uL (ref 0.0–0.1)
Basophils Relative: 0 %
Eosinophils Absolute: 0.1 10*3/uL (ref 0.0–0.5)
Eosinophils Relative: 2 %
HCT: 42.4 % (ref 39.0–52.0)
Hemoglobin: 13.6 g/dL (ref 13.0–17.0)
Immature Granulocytes: 1 %
Lymphocytes Relative: 24 %
Lymphs Abs: 2.1 10*3/uL (ref 0.7–4.0)
MCH: 30.2 pg (ref 26.0–34.0)
MCHC: 32.1 g/dL (ref 30.0–36.0)
MCV: 94 fL (ref 80.0–100.0)
Monocytes Absolute: 0.8 10*3/uL (ref 0.1–1.0)
Monocytes Relative: 9 %
Neutro Abs: 5.6 10*3/uL (ref 1.7–7.7)
Neutrophils Relative %: 64 %
Platelets: 200 10*3/uL (ref 150–400)
RBC: 4.51 MIL/uL (ref 4.22–5.81)
RDW: 13.7 % (ref 11.5–15.5)
WBC: 8.6 10*3/uL (ref 4.0–10.5)
nRBC: 0 % (ref 0.0–0.2)

## 2019-01-26 LAB — MAGNESIUM: Magnesium: 2.4 mg/dL (ref 1.7–2.4)

## 2019-01-26 LAB — TROPONIN I: Troponin I: <0.03 ng/mL (ref <0.03)

## 2019-01-26 LAB — D-DIMER, QUANTITATIVE: D-Dimer, Quant: 0.28 ug/mL-FEU (ref 0.00–0.50)

## 2019-01-26 NOTE — ED Notes (Signed)
Pt ambulatory to waiting room with NAD and steady gate.

## 2019-01-26 NOTE — Telephone Encounter (Signed)
Needs to be referred to [facility] if he hasn't already been

## 2019-01-26 NOTE — Discharge Instructions (Addendum)
Your tests tonight are reassuring. You can follow up with your cardiologists or your primary care doctor for any further concerns.

## 2019-01-26 NOTE — ED Triage Notes (Signed)
Pt c/o palpitations all day; pt states he recently had a mitral valvuloplasty; pt states he is having some chest and back pain

## 2019-01-26 NOTE — ED Notes (Addendum)
Pt admitted to recording this nurse and [facility] Tim. Pt advised by this nurse that he has been cleared by Dr. Lynelle Doctor. Pt requesting to be transferring to another facility. Pt told, the doctor does not see a need to admit the pt or transfer the pt to another facility. Per Dr. Lynelle Doctor pt is ready for D/C. Pt instructed to follow up with his cardiologist and PCP per D/C papers.

## 2019-01-26 NOTE — ED Provider Notes (Signed)
[facility] EMERGENCY DEPARTMENT Provider Note   CSN: [id number] Arrival date & time: 01/26/19  [id number]  Time seen 04:00 AM  History   Chief Complaint Chief Complaint  Patient presents with  . Palpitations    HPI Darren Haynes is a 33 y.o. male.     HPI history is difficult because patient and his wife jump from topic to topic and act like I should already know all about them.  From what I gather he had rheumatic fever and he states he has been on antibiotics for 3 months.  He states he had [facility].  He states [facility] and was diagnosed with a pneumonia.  He has been on moxifloxacin for the past 6 days and prednisone on a taper that he has been on for 20 days.  He states he still has some cough is still has some shortness of breath and he feels like it is been getting worse over the past week.  He states the first night he had pneumonia diagnosed he had a fever but not since.  He states he went in for a regular office appointment and they noted he had a heart rate of 120-150 and sent him to the ED.  That was when he was diagnosed with the pneumonia.  He states about 7 PM he started having pain and puts his hand in his left flank area and then about midnight the pain continued and he felt short of breath.  He states it is worse when he lays down.  He states he has been having palpitations "all the time" for about 3 weeks.  He states he is supposed to be getting a monitor placed.  He denies nausea, vomiting, or diarrhea.  He states he supposed to be on antibiotics "all my life".  [facility] Dr. Allyson SabalBerry Interventional cardiologist [facility] Dr. Regino SchultzeWang   Past Medical History:  Diagnosis Date  . Chronic back pain   . Panic attacks     Patient Active Problem List   Diagnosis Date Noted  . Mitral stenosis 12/09/2018    Past Surgical History:  Procedure Laterality Date  . MITRAL  VALVULOPLASTY    . RIGHT/LEFT HEART CATH AND CORONARY ANGIOGRAPHY N/A 12/11/2018   Procedure: RIGHT/LEFT HEART CATH AND CORONARY ANGIOGRAPHY;  Surgeon: Runell GessBerry, Jonathan J, MD;  Location: [facility] INVASIVE CV LAB;  Service: Cardiovascular;  Laterality: N/A;  . TEE WITHOUT CARDIOVERSION N/A 12/11/2018   Procedure: TRANSESOPHAGEAL ECHOCARDIOGRAM (TEE);  Surgeon: Thurmon Fairroitoru, Mihai, MD;  [facility];  Service: Cardiovascular;  Laterality: N/A;  . TYMPANOSTOMY TUBE PLACEMENT          Home Medications    Prior to Admission medications   Medication Sig Start Date End Date Taking? Authorizing Provider  albuterol (PROVENTIL) (2.5 MG/3ML) 0.083% nebulizer solution Take 2.5 mg by nebulization every 6 (six) hours as needed for wheezing or shortness of breath.    [provider]  ALPRAZolam Prudy Feeler(XANAX) 0.25 MG tablet Take 0.25 mg by mouth 3 (three) times daily as needed for anxiety.    [provider]  amoxicillin (AMOXIL) 500 MG capsule Take 500 mg by mouth 2 (two) times daily. For 20 days 12/30/18   [provider]  aspirin EC 81 MG tablet Take 81 mg by mouth daily.    [provider]  Buprenorphine HCl (BELBUCA) 300 MCG FILM Place 450 mcg inside cheek 2 (  two) times daily.     [provider]  etodolac (LODINE XL) 400 MG 24 hr tablet Take 400 mg by mouth daily.    [provider]  fluticasone-salmeterol (ADVAIR HFA) [phone] MCG/ACT inhaler Inhale 2 puffs into the lungs 2 (two) times daily.    [provider]  furosemide (LASIX) 40 MG tablet Take 1 tablet (40 mg total) by mouth daily. DO NOT TAKE ON THE DAY OF YOUR PROCEDURE ON 12/11/2018 12/09/18 03/09/19  Runell Gess, MD  gabapentin (NEURONTIN) 300 MG capsule Take 300 mg by mouth 5 (five) times daily.     [provider]  HYDROcodone-acetaminophen (NORCO) 10-325 MG tablet Take 1 tablet by mouth 3 (three) times daily.     [provider]  metaxalone (SKELAXIN) 800 MG tablet  Take 800 mg by mouth 4 (four) times daily.    [provider]  nortriptyline (PAMELOR) 25 MG capsule Take 25 mg by mouth at bedtime.  10/24/18   [provider]  potassium chloride (K-DUR,KLOR-CON) 10 MEQ tablet Take 10 mEq by mouth daily.    [provider]  predniSONE (DELTASONE) 10 MG tablet Take 5-30 mg by mouth See admin instructions. Take 30 mg by mouth for 7 days, 20 mg for 7 days, 10 mg for 7 days, 5 mg by mouth daily 12/31/18 02/10/19  [provider]  sertraline (ZOLOFT) 50 MG tablet Take 50 mg by mouth daily.    [provider]  temazepam (RESTORIL) 7.5 MG capsule Take 7.5 mg by mouth at bedtime as needed for sleep.  09/18/18   [provider]  Vitamin D, Ergocalciferol, (DRISDOL) 1.25 MG (50000 UT) CAPS capsule Take 50,000 Units by mouth every Wednesday.     [provider]    Family History Family History  Problem Relation Age of Onset  . Heart disease Mother   . Depression Mother   . Hypertension Father   . Hyperlipidemia Father     Social History Social History   Tobacco Use  . Smoking status: Never Smoker  . Smokeless tobacco: Current User    Types: Snuff  Substance Use Topics  . Alcohol use: No  . Drug use: No     Allergies   Patient has no known allergies.   Review of Systems Review of Systems  All other systems reviewed and are negative.    Physical Exam Updated Vital Signs BP 136/87 (BP Location: Right Arm)   Pulse 82   Temp 98.3 F (36.8 C) (Oral)   Resp 18   Ht 6\' 2"  (1.88 m)   Wt 89.8 kg   SpO2 99%   BMI 25.42 kg/m   Vital signs normal    Physical Exam Vitals signs and nursing note reviewed.  Constitutional:      Appearance: Normal appearance. He is normal weight.  HENT:     Head: Normocephalic and atraumatic.     Right Ear: External ear normal.     Left Ear: External ear normal.     Nose: Nose normal.     Mouth/Throat:     Mouth: Mucous membranes are moist.      Pharynx: Oropharynx is clear. No oropharyngeal exudate or posterior oropharyngeal erythema.  Eyes:     Extraocular Movements: Extraocular movements intact.     Conjunctiva/sclera: Conjunctivae normal.     Pupils: Pupils are equal, round, and reactive to light.  Neck:     Musculoskeletal: Normal range of motion.  Cardiovascular:  Rate and Rhythm: Normal rate. Frequent extrasystoles are present.    Heart sounds: No murmur.  Pulmonary:     Effort: Pulmonary effort is normal. No respiratory distress.     Breath sounds: Normal breath sounds.       Comments: Talks easily without shortness of breath.  He indicates his pain is in his left lower posterior rib cage area and states it hurts when I put my stethoscope on the area. Abdominal:     General: Abdomen is flat. Bowel sounds are normal.     Palpations: Abdomen is soft.  Musculoskeletal: Normal range of motion.  Skin:    General: Skin is warm and dry.     Findings: No erythema.  Neurological:     General: No focal deficit present.     Mental Status: He is alert and oriented to person, place, and time.     Cranial Nerves: No cranial nerve deficit.  Psychiatric:        Mood and Affect: Affect is labile.        Speech: Speech is rapid and pressured.        Behavior: Behavior is agitated.        Thought Content: Thought content normal.      ED Treatments / Results  Labs (all labs ordered are listed, but only abnormal results are displayed) Results for orders placed or performed during the hospital encounter of 01/26/19  CBC with Differential  Result Value Ref Range   WBC 8.6 4.0 - 10.5 K/uL   RBC 4.51 4.22 - 5.81 MIL/uL   Hemoglobin 13.6 13.0 - 17.0 g/dL   HCT 16.1 09.6 - 04.5 %   MCV 94.0 80.0 - 100.0 fL   MCH 30.2 26.0 - 34.0 pg   MCHC 32.1 30.0 - 36.0 g/dL   RDW 40.9 81.1 - 91.4 %   Platelets 200 150 - 400 K/uL   nRBC 0.0 0.0 - 0.2 %   Neutrophils Relative % 64 %   Neutro Abs 5.6 1.7 - 7.7 K/uL   Lymphocytes  Relative 24 %   Lymphs Abs 2.1 0.7 - 4.0 K/uL   Monocytes Relative 9 %   Monocytes Absolute 0.8 0.1 - 1.0 K/uL   Eosinophils Relative 2 %   Eosinophils Absolute 0.1 0.0 - 0.5 K/uL   Basophils Relative 0 %   Basophils Absolute 0.0 0.0 - 0.1 K/uL   Immature Granulocytes 1 %   Abs Immature Granulocytes 0.06 0.00 - 0.07 K/uL  Troponin I - ONCE - STAT  Result Value Ref Range   Troponin I <0.03 <0.03 ng/mL  Comprehensive metabolic panel  Result Value Ref Range   Sodium 138 135 - 145 mmol/L   Potassium 3.7 3.5 - 5.1 mmol/L   Chloride 104 98 - 111 mmol/L   CO2 25 22 - 32 mmol/L   Glucose, Bld 94 70 - 99 mg/dL   BUN 26 (H) 6 - 20 mg/dL   Creatinine, Ser 7.82 0.61 - 1.24 mg/dL   Calcium 8.5 (L) 8.9 - 10.3 mg/dL   Total Protein 6.8 6.5 - 8.1 g/dL   Albumin 3.8 3.5 - 5.0 g/dL   AST 13 (L) 15 - 41 U/L   ALT 28 0 - 44 U/L   Alkaline Phosphatase 48 38 - 126 U/L   Total Bilirubin 0.2 (L) 0.3 - 1.2 mg/dL   GFR calc non Af Amer >60 >60 mL/min   GFR calc Af Amer >60 >60 mL/min   Anion gap 9  5 - 15  Magnesium  Result Value Ref Range   Magnesium 2.4 1.7 - 2.4 mg/dL  D-dimer, quantitative  Result Value Ref Range   D-Dimer, Quant 0.28 0.00 - 0.50 ug/mL-FEU   Laboratory interpretation all normal     EKG EKG Interpretation  Date/Time:  Monday January 26 2019 02:53:23 EDT Ventricular Rate:  107 PR Interval:  162 QRS Duration: 88 QT Interval:  360 QTC Calculation: 480 R Axis:   79 Text Interpretation:  Undetermined rhythm Otherwise normal ECG Premature ventricular complexes No significant change since last tracing 01 Jan 2019 Confirmed by Devoria AlbeKnapp, Manasa Spease ([id number]) on 01/26/2019 3:37:21 AM   Radiology Dg Chest 2 View  Result Date: 01/26/2019 CLINICAL DATA:  Palpitations EXAM: CHEST - 2 VIEW COMPARISON:  01/01/2019 FINDINGS: Heart and mediastinal contours are within normal limits. No focal opacities or effusions. No acute bony abnormality. IMPRESSION: No active cardiopulmonary disease.  Electronically Signed   By: Charlett NoseKevin  Dover M.D.   On: 01/26/2019 03:19    Procedures Procedures (including critical care time)  Medications Ordered in ED Medications - No data to display   Initial Impression / Assessment and Plan / ED Course  I have reviewed the triage vital signs and the nursing notes.  Pertinent labs & imaging results that were available during my care of the patient were reviewed by me and considered in my medical decision making (see chart for details).       Review the [place] database shows patient gets #180 hydrocodone 10/325 monthly, last filled March 2, #60 Belbuca 450 mcg film last filled March 2, and #90 alprazolam 0.5 mg tablets last filled February 28.  5:50 AM I went in to talk to patient about his test results.  I tried to discuss with him that his d-dimer was normal and therefore a chest CT is not needed tonight.  I also tried to tell him his troponin was negative.  I discussed that [facility] had showed his pneumonia on March 10 and it was verified by the CT scan done on March 11 (they did a CTA to look for PE) so the fact that his chest x-ray today was improved is a good thing.  He cut me off and was being very argumentative.  I offered him a Toradol injection or a steroid shot for his pain.  He states that I have been rude to them, the lab tech was in the room when I did my initial interview and her comment to me was how rude they were to me after we left the room when I was asking them general questions.  Patient states he wants to see another doctor.  I told him there is no other doctor here.  He then states he wants to be transferred to another hospital.  I told him there is no reason to transfer him to another hospital.  Patient keeps cutting me off and is yelling at me.  At this point I told nursing staff he can either get his discharge papers or he can sign out AMA.  Final Clinical Impressions(s) / ED Diagnoses   Final diagnoses:   Palpitations  Left-sided chest wall pain    ED Discharge Orders    None     Plan discharge  Devoria AlbeIva Onofre Gains, MD, Concha PyoFACEP    Gilad Dugger, MD 01/26/19 [phone]

## 2019-01-28 ENCOUNTER — Telehealth (HOSPITAL_COMMUNITY): Payer: Self-pay

## 2019-01-28 NOTE — Telephone Encounter (Signed)
Called and spoke with pt in regards to CR, pt lives in [place], [place]. When ask pt which location he would like to attend, he stated [facility]. Will send referral to [facility].

## 2019-01-29 ENCOUNTER — Telehealth: Payer: Self-pay | Admitting: Cardiovascular Disease

## 2019-01-29 MED ORDER — POTASSIUM CHLORIDE CRYS ER 20 MEQ PO TBCR: 20.0000 meq | EXTENDED_RELEASE_TABLET | Freq: Every day | ORAL | 3 refills | Status: DC

## 2019-01-29 MED ORDER — FUROSEMIDE 40 MG PO TABS: 40.0000 mg | ORAL_TABLET | Freq: Two times a day (BID) | ORAL | 3 refills | Status: DC

## 2019-01-29 NOTE — Telephone Encounter (Signed)
Pt c/o swelling: STAT is pt has developed SOB within 24 hours  1) How much weight have you gained and in what time span? Over week 10lbs   2) If swelling, where is the swelling located? Legs and stomach   3) Are you currently taking a fluid pill? furosemide (LASIX) 40 MG tablet  4) Are you currently SOB? Always has been an issue  5) Do you have a log of your daily weights (if so, list)?   6) Have you gained 3 pounds in a day or 5 pounds in a week? Yes to 3 lbs in a day, and yes to 5lbs in a wk   7) Have you traveled recently? No

## 2019-01-29 NOTE — Telephone Encounter (Signed)
Spoke with pt who state he has gained 7 lbs in one day. He report yesterday he was 194 and this morning 201 with abdominal and leg swelling. He report symptoms of some SOB and a little chest pain, which he reports happens when his weight is greater than 195.  Pt states he is take 40 mg of lasix and 10 meq of Potassium daily but was instructed by PCP to take 60 mg lasix and 30 meq of Potassium when weight is greater than 195. Pt states two days ago he had some weight gain, took 60 of lasix and fluid came off. He report taking 60 today but not urinating as he normally would. Chart review by Dr. [place] (DOD) who advise pt to increase lasix to 40 mg BID and potassium to 20 meq. Pt voiced understanding to continue to monitoring weight/symptoms and if it gets worse or doesn't resolve in a couple of days, to report to ED.

## 2019-02-02 ENCOUNTER — Ambulatory Visit (INDEPENDENT_AMBULATORY_CARE_PROVIDER_SITE_OTHER): Payer: BLUE CROSS/BLUE SHIELD

## 2019-02-02 DIAGNOSIS — R002 Palpitations: Secondary | ICD-10-CM

## 2019-02-03 ENCOUNTER — Telehealth: Payer: Self-pay | Admitting: *Deleted

## 2019-02-03 ENCOUNTER — Telehealth: Payer: Self-pay | Admitting: Cardiovascular Disease

## 2019-02-03 NOTE — Telephone Encounter (Signed)
New Message   Patient does have heart monitor on and needs to keep his follow up appointment that was canceled.  Patient would like the nurse to call him back.  I was a little confused because the heart monitor appointment was canceled but he says he has the monitor on.

## 2019-02-03 NOTE — Telephone Encounter (Signed)
Left message for patient to call and reschedule follow up 02-04-2019. Patient was scheduled to follow up monitor. Monitor was never worn by the patient. Per dr berry can schedule for June.

## 2019-02-04 ENCOUNTER — Ambulatory Visit: Payer: BLUE CROSS/BLUE SHIELD | Admitting: Cardiovascular Disease

## 2019-02-04 NOTE — Telephone Encounter (Signed)
See my chart encounter.

## 2019-02-27 ENCOUNTER — Other Ambulatory Visit: Payer: Self-pay

## 2019-03-04 ENCOUNTER — Telehealth: Payer: Self-pay | Admitting: Cardiovascular Disease

## 2019-03-04 NOTE — Telephone Encounter (Signed)
Notes recorded by Runell Gess, MD on 03/02/2019 at 4:03 PM EDT 1. Normal sinus rhythm/sinus bradycardia/sinus tachycardia 2. Occasional PVCs and PACs 3. Runs of supraventricular tachycardia and nonsustained ventricular tachycardia 4. Needs return telemedicine visit to discuss   Spoke to patient . Results given . appt schedule with Dr Allyson Sabal  for 4/30 /20 at 9 am.  Patient voiced understanding. Patient aware to be available starting at 8:30 am . Patient states he will be able to obtain vitals.

## 2019-03-04 NOTE — Telephone Encounter (Signed)
Per pt call would like the results of his monitor test and would like to know the plan of care going forward.   Please give him a call back.

## 2019-03-11 ENCOUNTER — Telehealth: Payer: Self-pay | Admitting: Cardiovascular Disease

## 2019-03-11 NOTE — Telephone Encounter (Signed)
Smartphone/ my chart/ consent/ pre reg completed °

## 2019-03-12 ENCOUNTER — Telehealth (INDEPENDENT_AMBULATORY_CARE_PROVIDER_SITE_OTHER): Payer: BLUE CROSS/BLUE SHIELD | Admitting: Cardiovascular Disease

## 2019-03-12 ENCOUNTER — Telehealth: Payer: Self-pay

## 2019-03-12 DIAGNOSIS — Z8679 Personal history of other diseases of the circulatory system: Secondary | ICD-10-CM | POA: Insufficient documentation

## 2019-03-12 DIAGNOSIS — I4729 Other ventricular tachycardia: Secondary | ICD-10-CM | POA: Insufficient documentation

## 2019-03-12 DIAGNOSIS — R600 Localized edema: Secondary | ICD-10-CM

## 2019-03-12 DIAGNOSIS — I050 Rheumatic mitral stenosis: Secondary | ICD-10-CM | POA: Diagnosis not present

## 2019-03-12 DIAGNOSIS — I471 Supraventricular tachycardia: Secondary | ICD-10-CM | POA: Diagnosis not present

## 2019-03-12 DIAGNOSIS — R0600 Dyspnea, unspecified: Secondary | ICD-10-CM | POA: Insufficient documentation

## 2019-03-12 DIAGNOSIS — R0609 Other forms of dyspnea: Secondary | ICD-10-CM | POA: Diagnosis not present

## 2019-03-12 DIAGNOSIS — I472 Ventricular tachycardia: Secondary | ICD-10-CM | POA: Insufficient documentation

## 2019-03-12 MED ORDER — METOPROLOL SUCCINATE ER 25 MG PO TB24: 25.0000 mg | ORAL_TABLET | Freq: Every day | ORAL | 3 refills | Status: DC

## 2019-03-12 NOTE — Telephone Encounter (Signed)
Patient and/or DPR-approved person aware of AVS instructions and verbalized understanding. AVS released to MyChart.  

## 2019-03-12 NOTE — Progress Notes (Signed)
Virtual Visit via Video Note   This visit type was conducted due to national recommendations for restrictions regarding the COVID-19 Pandemic (e.g. social distancing) in an effort to limit this patient's exposure and mitigate transmission in our community.  Due to his co-morbid illnesses, this patient is at least at moderate risk for complications without adequate follow up.  This format is felt to be most appropriate for this patient at this time.  All issues noted in this document were discussed and addressed.  A limited physical exam was performed with this format.  Please refer to the patient's chart for his consent to telehealth for [facility].   Evaluation Performed:  Follow-up visit  Date:  03/12/2019   ID:  Darren Haynes, DOB [birth year], MRN [id number]  Patient Location: Home Provider Location: Home  [facility]  Cardiologist: Dr. Nanetta BattyJonathan Contessa Preuss Electrophysiologist:  None   Chief Complaint: Shortness of breath/palpitations  History of Present Illness:    Darren Haynes is a 33 y.o.  Thin appearing married Caucasian male father 2 children who did work in Aeronautical engineerlandscaping but has not worked in the last 9 months because of progressive dyspnea on exertion. He was referred by Dr. Hanley Haysosario for evaluation of symptomatic mitral stenosis.  I last saw him in the office 12/31/2018. Tere is no history of rheumatic fever. He has no other cardiac risk factors. He is had progressive dyspnea for the last 9 months with some nonspecific aches and pains. Is being worked up by rheumatologist. Echo performed 11/21/2018 revealed normal LV systolic function, a peak mitral gradient of 47 mmHg with a mean of 29 mmHg and mitral valve area 1.5 cm with a PA pressure of 69 mmHg. He does have severe dyspnea and lower extreme edema. He is on oral diuretic.He presented today for transesophageal echocardiogram performed Dr. Royann Shiversroitoru which showed classic rheumatic mitral stenosis with a mean  gradient of 15 mmHg.  I did right left heart cath revealing a significant mitral valve gradient, and normal coronary arteries.  I [facility] who performed successful mitral balloon valvuloplasty with almost immediate improvement in his pulmonary artery pressures.  12/25/2018.  He felt clinically improved.  Since I saw him 2 months ago he has complained of increasing dyspnea and some lower extremity edema.  He was placed on Lasix 80 mg a day which improved his edema.  He also had an event monitor that showed PACs, PVCs and episodes of PSVT and nonsustained ventricular tachycardia.  The patient does not have symptoms concerning for COVID-19 infection (fever, chills, cough, or new shortness of breath).    Past Medical History:  Diagnosis Date  . Chronic back pain   . Panic attacks    Past Surgical History:  Procedure Laterality Date  . MITRAL VALVULOPLASTY    . RIGHT/LEFT HEART CATH AND CORONARY ANGIOGRAPHY N/A 12/11/2018   Procedure: RIGHT/LEFT HEART CATH AND CORONARY ANGIOGRAPHY;  Surgeon: Runell GessBerry, Ladona Rosten J, MD;  [facility];  Service: Cardiovascular;  Laterality: N/A;  . TEE WITHOUT CARDIOVERSION N/A 12/11/2018   Procedure: TRANSESOPHAGEAL ECHOCARDIOGRAM (TEE);  Surgeon: Thurmon Fairroitoru, Mihai, MD;  Location: [facility] ENDOS[facility];  Service: Cardiovascular;  Laterality: N/A;  . TYMPANOSTOMY TUBE PLACEMENT       Current Meds  Medication Sig  . albuterol (PROVENTIL) (2.5 MG/3ML) 0.083% nebulizer solution Take 2.5 mg by nebulization every 6 (six) hours as needed for wheezing or shortness of breath.  . ALPRAZolam (XANAX) 0.25 MG tablet Take 0.25  mg by mouth 3 (three) times daily as needed for anxiety.  . amoxicillin (AMOXIL) 500 MG capsule Take 500 mg by mouth 2 (two) times daily. For 20 days  . aspirin EC 81 MG tablet Take 81 mg by mouth daily.  . Buprenorphine HCl (BELBUCA) 300 MCG FILM Place 450 mcg inside cheek 2 (two) times daily.   . etodolac (LODINE XL) 400 MG  24 hr tablet Take 400 mg by mouth daily.  . fluticasone-salmeterol (ADVAIR HFA) 45-21 MCG/ACT inhaler Inhale 2 puffs into the lungs 2 (two) times daily.  . furosemide (LASIX) 40 MG tablet Take 1 tablet (40 mg total) by mouth 2 (two) times daily. DO NOT TAKE ON THE DAY OF YOUR PROCEDURE ON 12/11/2018  . gabapentin (NEURONTIN) 300 MG capsule Take 300 mg by mouth 5 (five) times daily.   . HYDROcodone-acetaminophen (NORCO) 7.5-325 MG tablet Take 1 tablet by mouth every 6 (six) hours as needed for moderate pain.  . metaxalone (SKELAXIN) 800 MG tablet Take 800 mg by mouth 4 (four) times daily.  . methotrexate 2.5 MG tablet Take 15 mg by mouth once a week.   . nortriptyline (PAMELOR) 25 MG capsule Take 25 mg by mouth at bedtime.   . potassium chloride (K-DUR,KLOR-CON) 20 MEQ tablet Take 1 tablet (20 mEq total) by mouth daily.  . sertraline (ZOLOFT) 50 MG tablet Take 50 mg by mouth daily.  . temazepam (RESTORIL) 7.5 MG capsule Take 7.5 mg by mouth at bedtime as needed for sleep.   . Vitamin D, Ergocalciferol, (DRISDOL) 1.25 MG (50000 UT) CAPS capsule Take 50,000 Units by mouth every Wednesday.   . [DISCONTINUED] HYDROcodone-acetaminophen (NORCO) 10-325 MG tablet Take 1 tablet by mouth 3 (three) times daily.      Allergies:   Patient has no known allergies.   Social History   Tobacco Use  . Smoking status: Never Smoker  . Smokeless tobacco: Current User    Types: Snuff  Substance Use Topics  . Alcohol use: No  . Drug use: No     Family Hx: The patient's family history includes Depression in his mother; Heart disease in his mother; Hyperlipidemia in his father; Hypertension in his father.  ROS:   Please see the history of present illness.     All other systems reviewed and are negative.   Prior CV studies:   The following studies were reviewed today:  Event monitor  Labs/Other Tests and Data Reviewed:    EKG:  No ECG reviewed.  Recent Labs: 12/08/2018: B Natriuretic Peptide  968.4 01/26/2019: ALT 28; BUN 26; Creatinine, Ser 1.14; Hemoglobin 13.6; Magnesium 2.4; Platelets 200; Potassium 3.7; Sodium 138   Recent Lipid Panel No results found for: CHOL, TRIG, HDL, CHOLHDL, LDLCALC, LDLDIRECT  Wt Readings from Last 3 Encounters:  03/12/19 208 lb (94.3 kg)  01/26/19 198 lb (89.8 kg)  01/01/19 187 lb (84.8 kg)     Objective:    Vital Signs:  BP 137/85   Pulse (!) 112   Ht 6' 2" (1.88 m)   Wt 208 lb (94.3 kg)   BMI 26.71 kg/m    VITAL SIGNS:  reviewed GEN:  no acute distress RESPIRATORY:  normal respiratory effort, symmetric expansion NEURO:  alert and oriented x 3, no obvious focal deficit PSYCH:  normal affect  ASSESSMENT & PLAN:    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 Andrew Wang at [facility] 12/25/2018 with an excellent result. 2. Palpitations- event monitor showed   PACs, PVCs and episodes of PSVT and nonsustained ventricular tachycardia.  It is unclear why he is having these arrhythmias although he does have normal LV function.  His blood pressure is high enough to tolerate a low-dose beta-blocker which I will start (Toprol-XL 25 mg) 3. Lower extremity edema- improved on Lasix 80 mg a day 4. Dyspnea on exertion- progressive dyspnea after having significant improvement after his valvuloplasty.  This has not improved with oral diuretics.  I am going to repeat a 2D echocardiogram.  COVID-19 Education: The signs and symptoms of COVID-19 were discussed with the patient and how to seek care for testing (follow up with PCP or arrange E-visit).  The importance of social distancing was discussed today.  Time:   Today, I have spent 8 minutes with the patient with telehealth technology discussing the above problems.     Medication Adjustments/Labs and Tests Ordered: Current medicines are reviewed at length with the patient today.  Concerns regarding medicines are outlined above.   Tests Ordered: 2D  echocardiogram  Medication Changes: Add Toprol-XL 25 mg  Disposition:  Follow up in 3 month(s)  Signed, Teddie Mehta, MD  03/12/2019 8:54 AM    [facility]  

## 2019-03-12 NOTE — H&P (View-Only) (Signed)
Virtual Visit via Video Note   This visit type was conducted due to national recommendations for restrictions regarding the COVID-19 Pandemic (e.g. social distancing) in an effort to limit this patient's exposure and mitigate transmission in our community.  Due to his co-morbid illnesses, this patient is at least at moderate risk for complications without adequate follow up.  This format is felt to be most appropriate for this patient at this time.  All issues noted in this document were discussed and addressed.  A limited physical exam was performed with this format.  Please refer to the patient's chart for his consent to telehealth for [facility].   Evaluation Performed:  Follow-up visit  Date:  03/12/2019   ID:  Darren Haynes, DOB [birth year], MRN [id number]  Patient Location: Home Provider Location: Home  [facility]  [facility]  Cardiologist: Dr. Nanetta BattyJonathan Heran Campau Electrophysiologist:  None   Chief Complaint: Shortness of breath/palpitations  History of Present Illness:    Darren ClementMichael Cleavenger is a 33 y.o.  Thin appearing married Caucasian male father 2 children who did work in Aeronautical engineerlandscaping but has not worked in the last 9 months because of progressive dyspnea on exertion. He was referred by Dr. Hanley Haysosario for evaluation of symptomatic mitral stenosis.  I last saw him in the office 12/31/2018. Tere is no history of rheumatic fever. He has no other cardiac risk factors. He is had progressive dyspnea for the last 9 months with some nonspecific aches and pains. Is being worked up by rheumatologist. Echo performed 11/21/2018 revealed normal LV systolic function, a peak mitral gradient of 47 mmHg with a mean of 29 mmHg and mitral valve area 1.5 cm with a PA pressure of 69 mmHg. He does have severe dyspnea and lower extreme edema. He is on oral diuretic.He presented today for transesophageal echocardiogram performed Dr. Royann Shiversroitoru which showed classic rheumatic mitral stenosis with a mean  gradient of 15 mmHg.  I did right left heart cath revealing a significant mitral valve gradient, and normal coronary arteries.  I referred him to [facility] who performed successful mitral balloon valvuloplasty with almost immediate improvement in his pulmonary artery pressures.  12/25/2018.  He felt clinically improved.  Since I saw him 2 months ago he has complained of increasing dyspnea and some lower extremity edema.  He was placed on Lasix 80 mg a day which improved his edema.  He also had an event monitor that showed PACs, PVCs and episodes of PSVT and nonsustained ventricular tachycardia.  The patient does not have symptoms concerning for COVID-19 infection (fever, chills, cough, or new shortness of breath).    Past Medical History:  Diagnosis Date  . Chronic back pain   . Panic attacks    Past Surgical History:  Procedure Laterality Date  . MITRAL VALVULOPLASTY    . RIGHT/LEFT HEART CATH AND CORONARY ANGIOGRAPHY N/A 12/11/2018   Procedure: RIGHT/LEFT HEART CATH AND CORONARY ANGIOGRAPHY;  Surgeon: Runell GessBerry, Zondra Lawlor J, MD;  [facility];  Service: Cardiovascular;  Laterality: N/A;  . TEE WITHOUT CARDIOVERSION N/A 12/11/2018   Procedure: TRANSESOPHAGEAL ECHOCARDIOGRAM (TEE);  Surgeon: Thurmon Fairroitoru, Mihai, MD;  Location: [facility]OS[facility];  Service: Cardiovascular;  Laterality: N/A;  . TYMPANOSTOMY TUBE PLACEMENT       Current Meds  Medication Sig  . albuterol (PROVENTIL) (2.5 MG/3ML) 0.083% nebulizer solution Take 2.5 mg by nebulization every 6 (six) hours as needed for wheezing or shortness of breath.  . ALPRAZolam (XANAX) 0.25 MG tablet Take 0.25  mg by mouth 3 (three) times daily as needed for anxiety.  [name] Kitchen amoxicillin (AMOXIL) 500 MG capsule Take 500 mg by mouth 2 (two) times daily. For 20 days  . aspirin EC 81 MG tablet Take 81 mg by mouth daily.  . Buprenorphine HCl (BELBUCA) 300 MCG FILM Place 450 mcg inside cheek 2 (two) times daily.   [name] Kitchen etodolac (LODINE XL) 400 MG  24 hr tablet Take 400 mg by mouth daily.  . fluticasone-salmeterol (ADVAIR HFA) 45-21 MCG/ACT inhaler Inhale 2 puffs into the lungs 2 (two) times daily.  . furosemide (LASIX) 40 MG tablet Take 1 tablet (40 mg total) by mouth 2 (two) times daily. DO NOT TAKE ON THE DAY OF YOUR PROCEDURE ON 12/11/2018  . gabapentin (NEURONTIN) 300 MG capsule Take 300 mg by mouth 5 (five) times daily.   [name] Kitchen HYDROcodone-acetaminophen (NORCO) 7.5-325 MG tablet Take 1 tablet by mouth every 6 (six) hours as needed for moderate pain.  . metaxalone (SKELAXIN) 800 MG tablet Take 800 mg by mouth 4 (four) times daily.  . methotrexate 2.5 MG tablet Take 15 mg by mouth once a week.   . nortriptyline (PAMELOR) 25 MG capsule Take 25 mg by mouth at bedtime.   . potassium chloride (K-DUR,KLOR-CON) 20 MEQ tablet Take 1 tablet (20 mEq total) by mouth daily.  . sertraline (ZOLOFT) 50 MG tablet Take 50 mg by mouth daily.  . temazepam (RESTORIL) 7.5 MG capsule Take 7.5 mg by mouth at bedtime as needed for sleep.   . Vitamin D, Ergocalciferol, (DRISDOL) 1.25 MG (50000 UT) CAPS capsule Take 50,000 Units by mouth every Wednesday.   . [DISCONTINUED] HYDROcodone-acetaminophen (NORCO) 10-325 MG tablet Take 1 tablet by mouth 3 (three) times daily.      Allergies:   Patient has no known allergies.   Social History   Tobacco Use  . Smoking status: Never Smoker  . Smokeless tobacco: Current User    Types: Snuff  Substance Use Topics  . Alcohol use: No  . Drug use: No     Family Hx: The patient's family history includes Depression in his mother; Heart disease in his mother; Hyperlipidemia in his father; Hypertension in his father.  ROS:   Please see the history of present illness.     All other systems reviewed and are negative.   Prior CV studies:   The following studies were reviewed today:  Event monitor  Labs/Other Tests and Data Reviewed:    EKG:  No ECG reviewed.  Recent Labs: 12/08/2018: B Natriuretic Peptide  968.4 01/26/2019: ALT 28; BUN 26; Creatinine, Ser 1.14; Hemoglobin 13.6; Magnesium 2.4; Platelets 200; Potassium 3.7; Sodium 138   Recent Lipid Panel No results found for: CHOL, TRIG, HDL, CHOLHDL, LDLCALC, LDLDIRECT  Wt Readings from Last 3 Encounters:  03/12/19 208 lb (94.3 kg)  01/26/19 198 lb (89.8 kg)  01/01/19 187 lb (84.8 kg)     Objective:    Vital Signs:  BP 137/85   Pulse (!) 112   Ht 6\' 2"  (1.88 m)   Wt 208 lb (94.3 kg)   BMI 26.71 kg/m    VITAL SIGNS:  reviewed GEN:  no acute distress RESPIRATORY:  normal respiratory effort, symmetric expansion NEURO:  alert and oriented x 3, no obvious focal deficit PSYCH:  normal affect  ASSESSMENT & PLAN:    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 Nolon Rod at [facility] 12/25/2018 with an excellent result. 2. Palpitations- event monitor showed  PACs, PVCs and episodes of PSVT and nonsustained ventricular tachycardia.  It is unclear why he is having these arrhythmias although he does have normal LV function.  His blood pressure is high enough to tolerate a low-dose beta-blocker which I will start (Toprol-XL 25 mg) 3. Lower extremity edema- improved on Lasix 80 mg a day 4. Dyspnea on exertion- progressive dyspnea after having significant improvement after his valvuloplasty.  This has not improved with oral diuretics.  I am going to repeat a 2D echocardiogram.  COVID-19 Education: The signs and symptoms of COVID-19 were discussed with the patient and how to seek care for testing (follow up with PCP or arrange E-visit).  The importance of social distancing was discussed today.  Time:   Today, I have spent 8 minutes with the patient with telehealth technology discussing the above problems.     Medication Adjustments/Labs and Tests Ordered: Current medicines are reviewed at length with the patient today.  Concerns regarding medicines are outlined above.   Tests Ordered: 2D  echocardiogram  Medication Changes: Add Toprol-XL 25 mg  Disposition:  Follow up in 3 month(s)  Signed, Nanetta BattyJonathan Caleel Kiner, MD  03/12/2019 8:54 AM    [facility]

## 2019-03-12 NOTE — Patient Instructions (Addendum)
Medication Instructions:  Your physician has recommended you make the following change in your medication:  START METOPROLOL SUCCINATE (TOPROL XL) 25 MG BY MOUTH DAILY   If you need a refill on your cardiac medications before your next appointment, please call your pharmacy.   Lab work: NONE If you have labs (blood work) drawn today and your tests are completely normal, you will receive your results only by: [name] Kitchen MyChart Message (if you have MyChart) OR . A paper copy in the mail If you have any lab test that is abnormal or we need to change your treatment, we will call you to review the results.  Testing/Procedures: Your physician has requested that your scheduled echocardiogram appointment be moved up to sometime in the next 1-2 weeks. Echocardiography is a painless test that uses sound waves to create images of your heart. It provides your doctor with information about the size and shape of your heart and how well your heart's chambers and valves are working. This procedure takes approximately one hour. There are no restrictions for this procedure. You will be contacted by a scheduler to update your appointment. LOCATION: [address], [place], [place] [address]    Follow-Up: At [facility], you and your health needs are our priority.  As part of our continuing mission to provide you with exceptional heart care, we have created designated Provider Care Teams.  These Care Teams include your primary Cardiologist (physician) and Advanced Practice Providers (APPs -  Physician Assistants and Nurse Practitioners) who all work together to provide you with the care you need, when you need it. . You will need a follow up appointment in 4-6 weeks with an APP and in 3 months with Dr.Berry. Please call our office 2 months in advance to schedule this appointment.  You may see one of the following Advanced Practice Providers on your designated Care Team:   . Corine Shelter, [place] . Azalee Course,  PA-C . Micah Flesher, PA-C . Joni Reining, DNP . Theodore Demark, PA-C . Judy Pimple, PA-C . Marjie Skiff, PA-C

## 2019-03-13 ENCOUNTER — Ambulatory Visit (HOSPITAL_COMMUNITY): Payer: BLUE CROSS/BLUE SHIELD | Attending: Internal Medicine

## 2019-03-13 ENCOUNTER — Other Ambulatory Visit: Payer: Self-pay

## 2019-03-13 DIAGNOSIS — Z9889 Other specified postprocedural states: Secondary | ICD-10-CM

## 2019-03-16 ENCOUNTER — Telehealth: Payer: Self-pay | Admitting: Cardiovascular Disease

## 2019-03-16 NOTE — Telephone Encounter (Signed)
Result Notes for ECHOCARDIOGRAM COMPLETE   Notes recorded by Runell Gess, MD on 03/15/2019 at 12:05 PM EDT Mild to mod decrease in LVEF and prob mod MS. Needs TEE to further eval   RN  informed patient of the results . He states he is ready to have whatever is needed. He states she more short of breathe and have recently had 2 episodes of pneumonia. Patient aware will send information to Dr berry to set up procedure- TEE.

## 2019-03-16 NOTE — Telephone Encounter (Signed)
Patient calling for echo results 

## 2019-03-17 NOTE — Telephone Encounter (Signed)
Needs TEE sometime in the next 3 to 6 weeks

## 2019-03-21 ENCOUNTER — Other Ambulatory Visit: Payer: Self-pay

## 2019-03-21 ENCOUNTER — Encounter (HOSPITAL_COMMUNITY): Payer: Self-pay

## 2019-03-21 ENCOUNTER — Encounter (HOSPITAL_COMMUNITY): Payer: Self-pay | Admitting: Emergency Medicine

## 2019-03-21 ENCOUNTER — Ambulatory Visit (INDEPENDENT_AMBULATORY_CARE_PROVIDER_SITE_OTHER)
Admission: EM | Admit: 2019-03-21 | Discharge: 2019-03-21 | Disposition: A | Discharge status: Home / Self Care | Payer: BLUE CROSS/BLUE SHIELD | Source: Home / Self Care | Attending: Family Medicine | Admitting: Family Medicine

## 2019-03-21 ENCOUNTER — Emergency Department (HOSPITAL_COMMUNITY)
Admission: EM | Admit: 2019-03-21 | Discharge: 2019-03-21 | Disposition: A | Discharge status: Home / Self Care | Payer: BLUE CROSS/BLUE SHIELD | Attending: Emergency Medicine | Admitting: Emergency Medicine

## 2019-03-21 ENCOUNTER — Ambulatory Visit (INDEPENDENT_AMBULATORY_CARE_PROVIDER_SITE_OTHER): Payer: BLUE CROSS/BLUE SHIELD

## 2019-03-21 DIAGNOSIS — Z79899 Other long term (current) drug therapy: Secondary | ICD-10-CM | POA: Diagnosis not present

## 2019-03-21 DIAGNOSIS — J181 Lobar pneumonia, unspecified organism: Secondary | ICD-10-CM | POA: Diagnosis not present

## 2019-03-21 DIAGNOSIS — J189 Pneumonia, unspecified organism: Secondary | ICD-10-CM | POA: Diagnosis not present

## 2019-03-21 DIAGNOSIS — M549 Dorsalgia, unspecified: Secondary | ICD-10-CM | POA: Diagnosis not present

## 2019-03-21 DIAGNOSIS — R0602 Shortness of breath: Secondary | ICD-10-CM

## 2019-03-21 DIAGNOSIS — F17290 Nicotine dependence, other tobacco product, uncomplicated: Secondary | ICD-10-CM | POA: Diagnosis not present

## 2019-03-21 DIAGNOSIS — R0789 Other chest pain: Secondary | ICD-10-CM | POA: Diagnosis present

## 2019-03-21 DIAGNOSIS — G8929 Other chronic pain: Secondary | ICD-10-CM | POA: Insufficient documentation

## 2019-03-21 DIAGNOSIS — R609 Edema, unspecified: Secondary | ICD-10-CM | POA: Insufficient documentation

## 2019-03-21 DIAGNOSIS — Z952 Presence of prosthetic heart valve: Secondary | ICD-10-CM | POA: Insufficient documentation

## 2019-03-21 HISTORY — DX: Pulmonary hypertension, unspecified: I27.20

## 2019-03-21 HISTORY — DX: Rheumatic mitral stenosis: I05.0

## 2019-03-21 LAB — COMPREHENSIVE METABOLIC PANEL
ALT: 42 U/L (ref 0–44)
AST: 28 U/L (ref 15–41)
Albumin: 3.6 g/dL (ref 3.5–5.0)
Alkaline Phosphatase: 49 U/L (ref 38–126)
Anion gap: 11 (ref 5–15)
BUN: 13 mg/dL (ref 6–20)
CO2: 24 mmol/L (ref 22–32)
Calcium: 8.7 mg/dL — ABNORMAL LOW (ref 8.9–10.3)
Chloride: 104 mmol/L (ref 98–111)
Creatinine, Ser: 1.26 mg/dL — ABNORMAL HIGH (ref 0.61–1.24)
GFR calc Af Amer: >60 mL/min (ref >60)
GFR calc non Af Amer: >60 mL/min (ref >60)
Glucose, Bld: 101 mg/dL — ABNORMAL HIGH (ref 70–99)
Potassium: 3.9 mmol/L (ref 3.5–5.1)
Sodium: 139 mmol/L (ref 135–145)
Total Bilirubin: 0.4 mg/dL (ref 0.3–1.2)
Total Protein: 6.3 g/dL — ABNORMAL LOW (ref 6.5–8.1)

## 2019-03-21 LAB — CBC WITH DIFFERENTIAL/PLATELET
Abs Immature Granulocytes: 0.03 10*3/uL (ref 0.00–0.07)
Basophils Absolute: 0.1 10*3/uL (ref 0.0–0.1)
Basophils Relative: 0 %
Eosinophils Absolute: 0.3 10*3/uL (ref 0.0–0.5)
Eosinophils Relative: 2 %
HCT: 37.3 % — ABNORMAL LOW (ref 39.0–52.0)
Hemoglobin: 12.9 g/dL — ABNORMAL LOW (ref 13.0–17.0)
Immature Granulocytes: 0 %
Lymphocytes Relative: 15 %
Lymphs Abs: 1.7 10*3/uL (ref 0.7–4.0)
MCH: 31.4 pg (ref 26.0–34.0)
MCHC: 34.6 g/dL (ref 30.0–36.0)
MCV: 90.8 fL (ref 80.0–100.0)
Monocytes Absolute: 0.9 10*3/uL (ref 0.1–1.0)
Monocytes Relative: 7 %
Neutro Abs: 9 10*3/uL — ABNORMAL HIGH (ref 1.7–7.7)
Neutrophils Relative %: 76 %
Platelets: 200 10*3/uL (ref 150–400)
RBC: 4.11 MIL/uL — ABNORMAL LOW (ref 4.22–5.81)
RDW: 14 % (ref 11.5–15.5)
WBC: 11.9 10*3/uL — ABNORMAL HIGH (ref 4.0–10.5)
nRBC: 0 % (ref 0.0–0.2)

## 2019-03-21 LAB — BRAIN NATRIURETIC PEPTIDE: B Natriuretic Peptide: 66.2 pg/mL (ref 0.0–100.0)

## 2019-03-21 LAB — LACTIC ACID, PLASMA: Lactic Acid, Venous: 1 mmol/L (ref 0.5–1.9)

## 2019-03-21 LAB — TROPONIN I: Troponin I: <0.03 ng/mL (ref <0.03)

## 2019-03-21 LAB — D-DIMER, QUANTITATIVE: D-Dimer, Quant: <0.27 ug/mL-FEU (ref 0.00–0.50)

## 2019-03-21 MED ORDER — HYDROCODONE-ACETAMINOPHEN 5-325 MG PO TABS
1.0000 | ORAL_TABLET | Freq: Once | ORAL | Status: DC
  Filled 2019-03-21: qty 1

## 2019-03-21 MED ORDER — DOXYCYCLINE HYCLATE 100 MG PO CAPS: 100.0000 mg | ORAL_CAPSULE | Freq: Two times a day (BID) | ORAL | 0 refills | Status: DC

## 2019-03-21 NOTE — ED Provider Notes (Signed)
[facility]   [id number] 03/21/19 Arrival Time: 1735  ASSESSMENT & PLAN:  1. Shortness of breath   2. Pneumonia of right lower lobe due to infectious organism [facility])    I have personally viewed the imaging studies ordered this visit. Offered empiric treatment for possible RLL PNA seen on x-ray. He prefers ED evaluation secondary to persisten shortness of breath. VSS except for mild tachycardia. SpO2 normal.  Reviewed expectations re: course of current medical issues. Questions answered. Outlined signs and symptoms indicating need for more acute intervention. Patient verbalized understanding. After Visit Summary given.   SUBJECTIVE: History from: patient. Darren Haynes is a 33 y.o. male who presents with complaint of SOB and generalized back pain. "Same as when I had a pneumonia last time."   Past Medical History:  Diagnosis Date  . Chronic back pain   . Mitral valve stenosis   . Panic attacks   . Pulmonary hypertension (HCC)    Overall has been feeling mild SOB over the past few months after mitral valvuloplasty. Occasional and poorly described anterior chest discomfort without a specific pattern; describes as a sharp pain; transient. No specific aggravating or alleviating factors reported. Mild in severity. Occasional dry coughing. No wheezing. Has been treated at least once for pneumonia in the past 1-2 months. Afebrile. Normal PO intake without n/v. Ambulatory without difficulty. Occasional and mild LE edema; has had in the past. Normal bowel/bladder habits. No constipation or diarrhea. No recent URI symptoms. No recent travel. No suspected COVID-19 exposure. Taking current medications as directed. No chest or back injury.  Social History   Tobacco Use  Smoking Status Never Smoker  Smokeless Tobacco Current User  . Types: Snuff   ROS: As per HPI. All other systems negative.   OBJECTIVE:  Vitals:   03/21/19 1746  BP: 134/80  Pulse: (!) 107  Resp: 18  Temp:  98.1 F (36.7 C)  TempSrc: Oral  SpO2: 100%    General appearance: alert, oriented, no acute distress Eyes: PERRLA; EOMI; conjunctivae normal HENT: normocephalic; atraumatic Neck: supple with FROM Lungs: without labored respirations; overall lungs sound CTAB; able to speak full sentences Heart: slight tachycardia; regular Chest Wall: without tenderness to palpation Abdomen: soft, non-tender; bowel sounds normal; no masses or organomegaly; no guarding or rebound tenderness Extremities: without edema; without calf swelling or tenderness; symmetrical without gross deformities Skin: warm and dry; without rash or lesions Psychological: alert and cooperative; normal mood and affect   Imaging: Dg Chest 2 View  Result Date: 03/21/2019 CLINICAL DATA:  Chest pain and cough. EXAM: CHEST - 2 VIEW COMPARISON:  01/26/2019 radiographs FINDINGS: Streaky RIGHT LOWER lung opacities are suspicious for pneumonia. The cardiomediastinal silhouette is unremarkable. The LEFT lung is clear. No pleural effusion or pneumothorax. IMPRESSION: Streaky RIGHT LOWER lung opacities suspicious for pneumonia. Electronically Signed   By: Harmon Pier M.D.   On: 03/21/2019 18:14     No Known Allergies  Past Medical History:  Diagnosis Date  . Chronic back pain   . Mitral valve stenosis   . Panic attacks   . Pulmonary hypertension (HCC)    Social History   Socioeconomic History  . Marital status: Married    Spouse name: Not on file  . Number of children: Not on file  . Years of education: Not on file  . Highest education level: Not on file  Occupational History  . Not on file  Social Needs  . Financial resource strain: Not on file  .  Food insecurity:    Worry: Not on file    Inability: Not on file  . Transportation needs:    Medical: Not on file    Non-medical: Not on file  Tobacco Use  . Smoking status: Never Smoker  . Smokeless tobacco: Current User    Types: Snuff  Substance and Sexual Activity  .  Alcohol use: No  . Drug use: No  . Sexual activity: Not on file  Lifestyle  . Physical activity:    Days per week: Not on file    Minutes per session: Not on file  . Stress: Not on file  Relationships  . Social connections:    Talks on phone: Not on file    Gets together: Not on file    Attends religious service: Not on file    Active member of club or organization: Not on file    Attends meetings of clubs or organizations: Not on file    Relationship status: Not on file  . Intimate partner violence:    Fear of current or ex partner: Not on file    Emotionally abused: Not on file    Physically abused: Not on file    Forced sexual activity: Not on file  Other Topics Concern  . Not on file  Social History Narrative  . Not on file   Family History  Problem Relation Age of Onset  . Heart disease Mother   . Depression Mother   . Hypertension Father   . Hyperlipidemia Father    Past Surgical History:  Procedure Laterality Date  . MITRAL VALVULOPLASTY    . RIGHT/LEFT HEART CATH AND CORONARY ANGIOGRAPHY N/A 12/11/2018   Procedure: RIGHT/LEFT HEART CATH AND CORONARY ANGIOGRAPHY;  Surgeon: Runell GessBerry, Jonathan J, MD;  [facility];  Service: Cardiovascular;  Laterality: N/A;  . TEE WITHOUT CARDIOVERSION N/A 12/11/2018   Procedure: TRANSESOPHAGEAL ECHOCARDIOGRAM (TEE);  Surgeon: Thurmon Fairroitoru, Mihai, MD;  [facility];  Service: Cardiovascular;  Laterality: N/A;  . TYMPANOSTOMY TUBE PLACEMENT       Mardella LaymanHagler, Lachrista Heslin, MD 03/31/19 [phone]

## 2019-03-21 NOTE — ED Notes (Signed)
At time of discharge, pt pulled off prescription while I held the paperwork in my hand and was discussing d/c instructions "this is all I need" then started to stand up and leave. I continued with instruction, then he said "doxycycline doesn't work, I need monofloxin". I then went to speak with provider who was currently with another patient. I told the patient as soon as the provider finished I would speak to him about changing the prescription. Pt then said he "did not have time to wait because my wife is here" and proceeded to leave.

## 2019-03-21 NOTE — ED Triage Notes (Signed)
Pt here for increased back pain which feels the same as when had pna recently; pt with pulmonary hypertension

## 2019-03-21 NOTE — ED Notes (Signed)
MD at bedside. 

## 2019-03-21 NOTE — ED Provider Notes (Signed)
[facility] EMERGENCY DEPARTMENT Provider Note   CSN: [id number] Arrival date & time: 03/21/19  1830    History   Chief Complaint Chief Complaint  Patient presents with  . Chest Pain  . Shortness of Breath   HPI 33 year old male with a history of chronic back pain, mitral valve stenosis status post valvuloplasty in February 2020 at [facility] presents with shortness of breath.  Patient states that he has had ongoing symptoms now for the past couple of months that have worsened over the past week.  He states he has intermittent lower extremity edema.  He has had some cough and has been treated for pneumonia twice in the past few months.  He went to an urgent care today for evaluation of symptoms and the chest x-ray showed a right lower lobe pneumonia.  Patient was offered antibiotics but requested to be evaluated in the emergency department.  Patient is also complaining of some right-sided chest pain that is described as sharp and nonradiating.  Pain is worse with inspiration.  No nausea or vomiting.  No sick contacts.  Was compliance with his diuretic.  Past Medical History:  Diagnosis Date  . Chronic back pain   . Mitral valve stenosis   . Panic attacks   . Pulmonary hypertension [facility])     Patient Active Problem List   Diagnosis Date Noted  . PSVT (paroxysmal supraventricular tachycardia) (HCC) 03/12/2019  . Nonsustained ventricular tachycardia (HCC) 03/12/2019  . Bilateral lower extremity edema 03/12/2019  . Dyspnea on exertion 03/12/2019  . Mitral stenosis 12/09/2018    Past Surgical History:  Procedure Laterality Date  . MITRAL VALVULOPLASTY    . RIGHT/LEFT HEART CATH AND CORONARY ANGIOGRAPHY N/A 12/11/2018   Procedure: RIGHT/LEFT HEART CATH AND CORONARY ANGIOGRAPHY;  Surgeon: Runell Gess, MD;  Location: [facility] INVASIVE CV LAB;  Service: Cardiovascular;  Laterality: N/A;  . TEE WITHOUT CARDIOVERSION N/A 12/11/2018   Procedure: TRANSESOPHAGEAL ECHOCARDIOGRAM  (TEE);  Surgeon: Thurmon Fair, MD;  Location: [facility] ENDOSCOPY;  Service: Cardiovascular;  Laterality: N/A;  . TYMPANOSTOMY TUBE PLACEMENT          Home Medications    Prior to Admission medications   Medication Sig Start Date End Date Taking? Authorizing Provider  albuterol (PROVENTIL) (2.5 MG/3ML) 0.083% nebulizer solution Take 2.5 mg by nebulization every 6 (six) hours as needed for wheezing or shortness of breath.    [provider]  ALPRAZolam Prudy Feeler) 0.25 MG tablet Take 0.25 mg by mouth 3 (three) times daily as needed for anxiety.    [provider]  amoxicillin (AMOXIL) 500 MG capsule Take 500 mg by mouth 2 (two) times daily. For 20 days 12/30/18   [provider]  aspirin EC 81 MG tablet Take 81 mg by mouth daily.    [provider]  Buprenorphine HCl (BELBUCA) 300 MCG FILM Place 450 mcg inside cheek 2 (two) times daily.     [provider]  doxycycline (VIBRAMYCIN) 100 MG capsule Take 1 capsule (100 mg total) by mouth 2 (two) times daily for 10 days. 03/21/19 03/31/19  Vallery Ridge, MD  etodolac (LODINE XL) 400 MG 24 hr tablet Take 400 mg by mouth daily.    [provider]  fluticasone-salmeterol (ADVAIR HFA) [phone] MCG/ACT inhaler Inhale 2 puffs into the lungs 2 (two) times daily.    [provider]  furosemide (LASIX) 40 MG tablet Take 1 tablet (40 mg total) by mouth 2 (two) times daily. DO NOT TAKE ON THE  DAY OF YOUR PROCEDURE ON 12/11/2018 01/29/19 04/29/19  Runell GessBerry, Jonathan J, MD  gabapentin (NEURONTIN) 300 MG capsule Take 300 mg by mouth 5 (five) times daily.     [provider]  HYDROcodone-acetaminophen (NORCO) 7.5-325 MG tablet Take 1 tablet by mouth every 6 (six) hours as needed for moderate pain.    [provider]  metaxalone (SKELAXIN) 800 MG tablet Take 800 mg by mouth 4 (four) times daily.    [provider]  methotrexate 2.5 MG tablet Take 15 mg by mouth once a week.     [provider]  metoprolol succinate (TOPROL XL) 25 MG 24 hr tablet Take 1 tablet (25 mg total) by mouth daily. 03/12/19   Runell GessBerry, Jonathan J, MD  nortriptyline (PAMELOR) 25 MG capsule Take 25 mg by mouth at bedtime.  10/24/18   [provider]  potassium chloride (K-DUR,KLOR-CON) 20 MEQ tablet Take 1 tablet (20 mEq total) by mouth daily. 01/29/19   Runell GessBerry, Jonathan J, MD  sertraline (ZOLOFT) 50 MG tablet Take 50 mg by mouth daily.    [provider]  temazepam (RESTORIL) 7.5 MG capsule Take 7.5 mg by mouth at bedtime as needed for sleep.  09/18/18   [provider]  Vitamin D, Ergocalciferol, (DRISDOL) 1.25 MG (50000 UT) CAPS capsule Take 50,000 Units by mouth every Wednesday.     [provider]    Family History Family History  Problem Relation Age of Onset  . Heart disease Mother   . Depression Mother   . Hypertension Father   . Hyperlipidemia Father     Social History Social History   Tobacco Use  . Smoking status: Never Smoker  . Smokeless tobacco: Current User    Types: Snuff  Substance Use Topics  . Alcohol use: No  . Drug use: No     Allergies   Patient has no known allergies.   Review of Systems Review of Systems  Constitutional: Negative for chills and fever.  HENT: Negative for ear pain and sore throat.   Eyes: Negative for pain and visual disturbance.  Respiratory: Positive for cough and shortness of breath.   Cardiovascular: Positive for chest pain. Negative for palpitations.  Gastrointestinal: Negative for abdominal pain and vomiting.  Genitourinary: Negative for dysuria and hematuria.  Musculoskeletal: Positive for back pain. Negative for arthralgias.  Skin: Negative for color change and rash.  Neurological: Negative for seizures and syncope.  All other systems reviewed and are negative.    Physical Exam Updated Vital Signs BP 132/75   Pulse 90   Temp 98.6 F (37 C) (Oral)   Resp 13   Ht 6\' 1"  (1.854 m)   Wt  95.3 kg   SpO2 100%   BMI 27.71 kg/m   Physical Exam Vitals signs and nursing note reviewed.  Constitutional:      Appearance: He is well-developed.  HENT:     Head: Normocephalic and atraumatic.     Nose: Nose normal.     Mouth/Throat:     Mouth: Mucous membranes are moist.  Eyes:     Conjunctiva/sclera: Conjunctivae normal.  Neck:     Musculoskeletal: Normal range of motion and neck supple.  Cardiovascular:     Rate and Rhythm: Normal rate and regular rhythm.     Heart sounds: No murmur.  Pulmonary:     Effort: Pulmonary effort is normal. No respiratory distress.     Breath sounds: Normal breath sounds.  Abdominal:  Palpations: Abdomen is soft.     Tenderness: There is no abdominal tenderness.  Musculoskeletal:     Right lower leg: No edema.     Left lower leg: No edema.  Skin:    General: Skin is warm and dry.  Neurological:     General: No focal deficit present.     Mental Status: He is alert.      ED Treatments / Results  Labs (all labs ordered are listed, but only abnormal results are displayed) Labs Reviewed  CBC WITH DIFFERENTIAL/PLATELET - Abnormal; Notable for the following components:      Result Value   WBC 11.9 (*)    RBC 4.11 (*)    Hemoglobin 12.9 (*)    HCT 37.3 (*)    Neutro Abs 9.0 (*)    All other components within normal limits  COMPREHENSIVE METABOLIC PANEL - Abnormal; Notable for the following components:   Glucose, Bld 101 (*)    Creatinine, Ser 1.26 (*)    Calcium 8.7 (*)    Total Protein 6.3 (*)    All other components within normal limits  LACTIC ACID, PLASMA  D-DIMER, QUANTITATIVE (NOT AT [facility])  TROPONIN I  BRAIN NATRIURETIC PEPTIDE  LACTIC ACID, PLASMA    EKG None  Radiology Dg Chest 2 View  Result Date: 03/21/2019 CLINICAL DATA:  Chest pain and cough. EXAM: CHEST - 2 VIEW COMPARISON:  01/26/2019 radiographs FINDINGS: Streaky RIGHT LOWER lung opacities are suspicious for pneumonia. The cardiomediastinal silhouette is  unremarkable. The LEFT lung is clear. No pleural effusion or pneumothorax. IMPRESSION: Streaky RIGHT LOWER lung opacities suspicious for pneumonia. Electronically Signed   By: Harmon PierJeffrey  Hu M.D.   On: 03/21/2019 18:14    Procedures Procedures (including critical care time)  Medications Ordered in ED Medications  HYDROcodone-acetaminophen (NORCO/VICODIN) 5-325 MG per tablet 1 tablet (1 tablet Oral Not Given 03/21/19 2013)     Initial Impression / Assessment and Plan / ED Course  I have reviewed the triage vital signs and the nursing notes.  Pertinent labs & imaging results that were available during my care of the patient were reviewed by me and considered in my medical decision making (see chart for details).  33 year old male with a history of chronic back pain, mitral valve stenosis status [facility] presents with shortness of breath.  Patient is hemodynamically stable.  Afebrile.  Normal SPO2 on room air.  No peripheral edema appreciated on exam.  Clear to auscultation bilaterally with no increased work of breathing.  Patient was seen in urgent care today and had a chest x-ray that showed a right lower lobe streaky opacity concerning for pneumonia.  Would not repeat chest x-ray at this time but will obtain labs to evaluate his electrolytes and volume status.  Patient had a right/left heart cath in January that showed severe mitral stenosis with rheumatic features, normal LV function normal coronary arteries.  He had a echo performed on 5/1 that showed mildly reduced systolic function with an EF of 45 to 50%.  Ultrasound a small pericardial effusion and mild to moderate mitral valve stenosis.  WBC 11.9, hemoglobin 12.9, platelets 200.  Creatinine 1.26.  BNP 66 d-dimer and troponin undetectable.  Patient is sharp chest pain that is been ongoing now for the past month and had a heart cath in January that showed clean coronary arteries I have low suspicion for  ACS.  No further labs indicated at this time.  On reevaluation, patient  is resting comfortably.  He is hemodynamically stable.  Sats are 100% on room air.  Blood pressures within normal limits and is not tachycardic.  I discussed the lab findings that were reassuring and showed the patient was likely not volume overloaded or in acute heart failure.  I discussed the patient could just be symptomatic from pneumonia however he was adamant that he wanted to see a cardiologist for which I was clear that there was no indication at this time for him to have emergent evaluation in the emergency department due to his stability and reassuring lab values.  I recommended the patient touch base with his cardiologist to have close outpatient follow-up.  He then became frustrated with having to have phone interviews with his cardiologist.   Patient discharged home in stable condition with strict return precautions.  Prescription for doxycycline written.  Final Clinical Impressions(s) / ED Diagnoses   Final diagnoses:  Community acquired pneumonia of right lower lobe of lung [facility])    ED Discharge Orders         Ordered    doxycycline (VIBRAMYCIN) 100 MG capsule  2 times daily     03/21/19 2019           Vallery Ridge, MD 03/21/19 2023    Sabas Sous, MD 03/21/19 2210

## 2019-03-21 NOTE — Discharge Instructions (Addendum)
You were evaluated in the Emergency Department and after careful evaluation, we did not find any emergent condition requiring admission or further testing in the hospital.  We sent a personal message to your cardiologist to help you get a closer follow-up.  Please take the antibiotics as directed.  Please return to the Emergency Department if you experience any worsening of your condition.  We encourage you to follow up with a primary care provider.  Thank you for allowing [place] to be a part of your care.

## 2019-03-25 ENCOUNTER — Other Ambulatory Visit (HOSPITAL_COMMUNITY)
Admission: RE | Admit: 2019-03-25 | Discharge: 2019-03-25 | Disposition: A | Discharge status: Home / Self Care | Payer: BLUE CROSS/BLUE SHIELD | Source: Ambulatory Visit | Attending: Cardiovascular Disease | Admitting: Cardiovascular Disease

## 2019-03-25 ENCOUNTER — Other Ambulatory Visit: Payer: Self-pay

## 2019-03-25 ENCOUNTER — Telehealth: Payer: Self-pay

## 2019-03-25 DIAGNOSIS — Z1159 Encounter for screening for other viral diseases: Secondary | ICD-10-CM | POA: Insufficient documentation

## 2019-03-25 DIAGNOSIS — Z01812 Encounter for preprocedural laboratory examination: Secondary | ICD-10-CM

## 2019-03-25 LAB — SARS CORONAVIRUS 2 BY RT PCR (HOSPITAL ORDER, PERFORMED IN ~~LOC~~ HOSPITAL LAB): SARS Coronavirus 2: NEGATIVE

## 2019-03-25 NOTE — Telephone Encounter (Signed)
    COVID-19 Pre-Screening Questions:  . In the past 7 to 10 days have you had a cough,  shortness of breath, headache, congestion, fever, body aches, chills, sore throat, or sudden loss of taste or sense of smell? NO . Have you been around anyone with known Covid 19.  NO . Have you been around anyone who is awaiting Covid 19 test results in the past 7 to 10 days?  NO . Have you been around anyone who has been exposed to Covid 19, or has mentioned symptoms of Covid 19 within the past 7 to 10 days?  NO  If you have any concerns about symptoms your patients report please contact your leadership team, or the provider the patient is seeing in the office for further guidance.       Spoke with pt who is aware of his upcoming TEE scheduled for 5/15 at 12 pm. Reviewed instructions for procedure with pt and mychart message including instructions sent to pt. Pt aware to present to [facility] office today for CBC and BMETand to present to [facility] [address] [place], [place] [address] for COVID-19 testing today at 3pm and that he must quarantine himself thereafter.  Pt verbalized understanding

## 2019-03-26 LAB — CBC
Hematocrit: 37.4 % — ABNORMAL LOW (ref 37.5–51.0)
Hemoglobin: 13.2 g/dL (ref 13.0–17.7)
MCH: 30.8 pg (ref 26.6–33.0)
MCHC: 35.3 g/dL (ref 31.5–35.7)
MCV: 87 fL (ref 79–97)
Platelets: 218 10*3/uL (ref 150–450)
RBC: 4.28 x10E6/uL (ref 4.14–5.80)
RDW: 14.8 % (ref 11.6–15.4)
WBC: 4.5 10*3/uL (ref 3.4–10.8)

## 2019-03-26 LAB — BASIC METABOLIC PANEL
BUN/Creatinine Ratio: 15 (ref 9–20)
BUN: 19 mg/dL (ref 6–20)
CO2: 20 mmol/L (ref 20–29)
Calcium: 8.7 mg/dL (ref 8.7–10.2)
Chloride: 96 mmol/L (ref 96–106)
Creatinine, Ser: 1.24 mg/dL (ref 0.76–1.27)
GFR calc Af Amer: 89 mL/min/{1.73_m2} (ref >59)
GFR calc non Af Amer: 77 mL/min/{1.73_m2} (ref >59)
Glucose: 137 mg/dL — ABNORMAL HIGH (ref 65–99)
Potassium: 3.4 mmol/L — ABNORMAL LOW (ref 3.5–5.2)
Sodium: 136 mmol/L (ref 134–144)

## 2019-03-26 NOTE — Progress Notes (Signed)
Patient has procedure in endoscopy tomorrow, made sure since the COVID screening lab he had that he has not had any symptoms of sickness and not been around anyone who has been sick. COVID screening complete.

## 2019-03-27 ENCOUNTER — Ambulatory Visit (HOSPITAL_COMMUNITY): Payer: BLUE CROSS/BLUE SHIELD | Admitting: Anesthesiology

## 2019-03-27 ENCOUNTER — Ambulatory Visit (HOSPITAL_COMMUNITY)
Admission: RE | Admit: 2019-03-27 | Discharge: 2019-03-27 | Disposition: A | Discharge status: Home / Self Care | Payer: BLUE CROSS/BLUE SHIELD | Attending: Cardiovascular Disease | Admitting: Cardiovascular Disease

## 2019-03-27 ENCOUNTER — Encounter (HOSPITAL_COMMUNITY): Payer: Self-pay | Admitting: Anesthesiology

## 2019-03-27 ENCOUNTER — Ambulatory Visit (HOSPITAL_BASED_OUTPATIENT_CLINIC_OR_DEPARTMENT_OTHER)
Admission: RE | Admit: 2019-03-27 | Discharge: 2019-03-27 | Disposition: A | Discharge status: Home / Self Care | Payer: BLUE CROSS/BLUE SHIELD | Source: Home / Self Care | Attending: Cardiovascular Disease | Admitting: Cardiovascular Disease

## 2019-03-27 ENCOUNTER — Encounter (HOSPITAL_COMMUNITY)
Admission: RE | Disposition: A | Discharge status: Home / Self Care | Payer: Self-pay | Source: Home / Self Care | Attending: Cardiovascular Disease

## 2019-03-27 ENCOUNTER — Other Ambulatory Visit: Payer: Self-pay

## 2019-03-27 DIAGNOSIS — I472 Ventricular tachycardia: Secondary | ICD-10-CM | POA: Insufficient documentation

## 2019-03-27 DIAGNOSIS — R0609 Other forms of dyspnea: Secondary | ICD-10-CM | POA: Insufficient documentation

## 2019-03-27 DIAGNOSIS — I080 Rheumatic disorders of both mitral and aortic valves: Secondary | ICD-10-CM | POA: Insufficient documentation

## 2019-03-27 DIAGNOSIS — I342 Nonrheumatic mitral (valve) stenosis: Secondary | ICD-10-CM

## 2019-03-27 DIAGNOSIS — Z79899 Other long term (current) drug therapy: Secondary | ICD-10-CM | POA: Diagnosis not present

## 2019-03-27 DIAGNOSIS — Z8249 Family history of ischemic heart disease and other diseases of the circulatory system: Secondary | ICD-10-CM | POA: Diagnosis not present

## 2019-03-27 DIAGNOSIS — I493 Ventricular premature depolarization: Secondary | ICD-10-CM | POA: Insufficient documentation

## 2019-03-27 DIAGNOSIS — Z7982 Long term (current) use of aspirin: Secondary | ICD-10-CM | POA: Diagnosis not present

## 2019-03-27 DIAGNOSIS — R609 Edema, unspecified: Secondary | ICD-10-CM | POA: Insufficient documentation

## 2019-03-27 DIAGNOSIS — Z7951 Long term (current) use of inhaled steroids: Secondary | ICD-10-CM | POA: Diagnosis not present

## 2019-03-27 DIAGNOSIS — R002 Palpitations: Secondary | ICD-10-CM | POA: Diagnosis present

## 2019-03-27 DIAGNOSIS — G8929 Other chronic pain: Secondary | ICD-10-CM | POA: Diagnosis not present

## 2019-03-27 DIAGNOSIS — I471 Supraventricular tachycardia: Secondary | ICD-10-CM | POA: Diagnosis not present

## 2019-03-27 HISTORY — PX: TEE WITHOUT CARDIOVERSION: SHX5443

## 2019-03-27 SURGERY — ECHOCARDIOGRAM, TRANSESOPHAGEAL
Anesthesia: Monitor Anesthesia Care

## 2019-03-27 MED ORDER — BUTAMBEN-TETRACAINE-BENZOCAINE 2-2-14 % EX AERO
INHALATION_SPRAY | CUTANEOUS | Status: DC | PRN
  Administered 2019-03-27: 12:00:00 2 via TOPICAL

## 2019-03-27 MED ORDER — PROMETHAZINE HCL 25 MG/ML IJ SOLN: 6.2500 mg | INTRAMUSCULAR | Status: DC | PRN

## 2019-03-27 MED ORDER — SODIUM CHLORIDE 0.9 % IV SOLN
INTRAVENOUS | Status: DC | PRN
  Administered 2019-03-27 (×2): via INTRAVENOUS

## 2019-03-27 MED ORDER — PROPOFOL 500 MG/50ML IV EMUL
INTRAVENOUS | Status: DC | PRN
  Administered 2019-03-27: 100 ug/kg/min via INTRAVENOUS

## 2019-03-27 MED ORDER — SODIUM CHLORIDE 0.9 % IV SOLN: INTRAVENOUS | Status: DC

## 2019-03-27 MED ORDER — PROPOFOL 10 MG/ML IV BOLUS
INTRAVENOUS | Status: DC | PRN
  Administered 2019-03-27 (×2): 30 mg via INTRAVENOUS
  Administered 2019-03-27: 20 mg via INTRAVENOUS

## 2019-03-27 NOTE — Transfer of Care (Signed)
Immediate Anesthesia Transfer of Care Note  Patient: Darren Haynes  Procedure(s) Performed: TRANSESOPHAGEAL ECHOCARDIOGRAM (TEE) (N/A )  Patient Location: PACU  Anesthesia Type:MAC  Level of Consciousness: awake, oriented and patient cooperative  Airway & Oxygen Therapy: Patient Spontanous Breathing and Patient connected to nasal cannula oxygen  Post-op Assessment: Report given to RN and Post -op Vital signs reviewed and stable  Post vital signs: Reviewed  Last Vitals:  Vitals Value Taken Time  BP    Temp    Pulse 72 03/27/2019 12:50 PM  Resp 18 03/27/2019 12:50 PM  SpO2 100 % 03/27/2019 12:50 PM  Vitals shown include unvalidated device data.  Last Pain:  Vitals:   03/27/19 1117  TempSrc: Oral  PainSc: 8          Complications: No apparent anesthesia complications

## 2019-03-27 NOTE — Anesthesia Postprocedure Evaluation (Signed)
Anesthesia Post Note  Patient: Darren Haynes  Procedure(s) Performed: TRANSESOPHAGEAL ECHOCARDIOGRAM (TEE) (N/A )     Patient location during evaluation: PACU Anesthesia Type: MAC Level of consciousness: awake and alert Pain management: pain level controlled Vital Signs Assessment: post-procedure vital signs reviewed and stable Respiratory status: spontaneous breathing, nonlabored ventilation and respiratory function stable Cardiovascular status: blood pressure returned to baseline and stable Postop Assessment: no apparent nausea or vomiting Anesthetic complications: no    Last Vitals:  Vitals:   03/27/19 1300 03/27/19 1310  BP: (!) 109/57 (!) 123/56  Pulse: 67 66  Resp: 15 15  Temp:    SpO2: 100% 100%    Last Pain:  Vitals:   03/27/19 1250  TempSrc: Oral  PainSc: 0-No pain                 Brennan Bailey

## 2019-03-27 NOTE — CV Procedure (Signed)
    Transesophageal Echocardiogram Note  Nickalos Schooler [id number] [birth year]  Procedure: Transesophageal Echocardiogram Indications: mitral stenosis   Procedure Details Consent: Obtained Time Out: Verified patient identification, verified procedure, site/side was marked, verified correct patient position, special equipment/implants available, Radiology Safety Procedures followed,  medications/allergies/relevent history reviewed, required imaging and test results available.  Performed  Medications:  During this procedure the patient is administered propofol drip - total of 404 mg for TEE   Left Ventrical:  Mildly depressed LV function  Mitral Valve: moderate - severe MS ,.   Mean gradient betweek 10-12 mmHg.   Aortic Valve: normal structure .  Trivial AI  Tricuspid Valve: normal   Pulmonic Valve:  No significant PI   Left Atrium/ Left atrial appendage: no thrombi   Atrial septum: intact by color flow   Aorta: normal    Complications: No apparent complications Patient did tolerate procedure well.   Vesta Mixer, Montez Hageman., MD, [facility] 03/27/2019, 12:44 PM

## 2019-03-27 NOTE — Anesthesia Procedure Notes (Signed)
Procedure Name: MAC Date/Time: 03/27/2019 12:12 PM Performed by: Jenne Campus, CRNA Pre-anesthesia Checklist: Patient identified, Emergency Drugs available, Suction available and Patient being monitored Oxygen Delivery Method: Nasal cannula

## 2019-03-27 NOTE — Anesthesia Preprocedure Evaluation (Addendum)
Anesthesia Evaluation  Patient identified by MRN, date of birth, ID band Patient awake    Reviewed: Allergy & Precautions, NPO status , Patient's Chart, lab work & pertinent test results  History of Anesthesia Complications Negative for: history of anesthetic complications  Airway Mallampati: II  TM Distance: >3 FB Neck ROM: Full    Dental  (+) Teeth Intact, Dental Advisory Given   Pulmonary shortness of breath, pneumonia, unresolved,    Pulmonary exam normal breath sounds clear to auscultation       Cardiovascular Normal cardiovascular exam+ dysrhythmias Supra Ventricular Tachycardia + Valvular Problems/Murmurs  Rhythm:Regular Rate:Normal  TTE 03/13/19: EF 45-50%, LVEF is appears mildly decreased with inferior hypokinesis, mild-moderate mitral valve stenosis   S/p mitral valvuloplasty 12/2018 at [facility]   Neuro/Psych Anxiety negative neurological ROS     GI/Hepatic negative GI ROS, Neg liver ROS,   Endo/Other  negative endocrine ROS  Renal/GU negative Renal ROS     Musculoskeletal negative musculoskeletal ROS (+)   Abdominal   Peds  Hematology negative hematology ROS (+)   Anesthesia Other Findings Day of surgery medications reviewed with the patient.  Reproductive/Obstetrics                          Anesthesia Physical Anesthesia Plan  ASA: III  Anesthesia Plan: MAC   Post-op Pain Management:    Induction:   PONV Risk Score and Plan: 1 and Treatment may vary due to age or medical condition and Propofol infusion  Airway Management Planned: Natural Airway and Nasal Cannula  Additional Equipment:   Intra-op Plan:   Post-operative Plan:   Informed Consent: I have reviewed the patients History and Physical, chart, labs and discussed the procedure including the risks, benefits and alternatives for the proposed anesthesia with the patient or authorized representative who has indicated  his/her understanding and acceptance.     Dental advisory given  Plan Discussed with: CRNA  Anesthesia Plan Comments:        Anesthesia Quick Evaluation

## 2019-03-27 NOTE — Interval H&P Note (Signed)
History and Physical Interval Note:  03/27/2019 11:58 AM  Darren Haynes  has presented today for surgery, with the diagnosis of Mitral stenosis.  The various methods of treatment have been discussed with the patient and family. After consideration of risks, benefits and other options for treatment, the patient has consented to  Procedure(s): TRANSESOPHAGEAL ECHOCARDIOGRAM (TEE) (N/A) as a surgical intervention.  The patient's history has been reviewed, patient examined, no change in status, stable for surgery.  I have reviewed the patient's chart and labs.  Questions were answered to the patient's satisfaction.     Kristeen Miss

## 2019-03-27 NOTE — Progress Notes (Signed)
  Echocardiogram 2D Echocardiogram has been performed.  Darren Haynes 03/27/2019, 12:55 PM

## 2019-04-01 ENCOUNTER — Telehealth: Payer: Self-pay | Admitting: Cardiovascular Disease

## 2019-04-01 NOTE — Telephone Encounter (Signed)
New message   Patient would like a call to go over what the next steps will be toward heart surgery. Please call.

## 2019-04-01 NOTE — Telephone Encounter (Signed)
Follow up ° °Please see the below message. ° ° °

## 2019-04-01 NOTE — Telephone Encounter (Signed)
The patient is followed by Dr. Allyson Sabal. He had an e-visit on 4/30/020- 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 Nolon Rod at [facility] 12/25/2018 with an excellent result.   He had an transthoracic echo done on 03/13/19. He had a TEE done on 03/27/19.  He currently has no follow up scheduled.  To Dr. Allyson Sabal to advise on next steps for the patient.

## 2019-04-02 NOTE — Telephone Encounter (Signed)
Needs a telemedicine follow-up with me in the next 2 weeks

## 2019-04-09 ENCOUNTER — Telehealth: Payer: Self-pay | Admitting: Cardiovascular Disease

## 2019-04-09 ENCOUNTER — Telehealth: Payer: Self-pay

## 2019-04-09 NOTE — Telephone Encounter (Signed)
New Message ° ° ° °Pt is returning call  ° ° ° °Please call back  °

## 2019-04-09 NOTE — Telephone Encounter (Signed)
Pt has 6/2 appt with Dr. Allyson Sabal

## 2019-04-09 NOTE — Telephone Encounter (Signed)
Called pt to set up a f/u virtual visit next week with Dr. Allyson Sabal s/p TEE. [email or web address]

## 2019-04-09 NOTE — Telephone Encounter (Signed)
Spoke with pt. Scheduled appt next week to discuss tee as directed by Dr Allyson Sabal

## 2019-04-10 ENCOUNTER — Telehealth: Payer: Self-pay | Admitting: Cardiovascular Disease

## 2019-04-10 NOTE — Telephone Encounter (Signed)
smartphone/ my chart/ consent/ pre reg completed  °

## 2019-04-14 ENCOUNTER — Telehealth: Payer: Self-pay

## 2019-04-14 ENCOUNTER — Telehealth (INDEPENDENT_AMBULATORY_CARE_PROVIDER_SITE_OTHER): Payer: BC Managed Care – PPO | Admitting: Cardiovascular Disease

## 2019-04-14 VITALS — BP 129/79 | HR 96 | Ht 74.0 in | Wt 211.0 lb

## 2019-04-14 DIAGNOSIS — R600 Localized edema: Secondary | ICD-10-CM

## 2019-04-14 DIAGNOSIS — I471 Supraventricular tachycardia: Secondary | ICD-10-CM

## 2019-04-14 DIAGNOSIS — R0609 Other forms of dyspnea: Secondary | ICD-10-CM

## 2019-04-14 DIAGNOSIS — I050 Rheumatic mitral stenosis: Secondary | ICD-10-CM

## 2019-04-14 DIAGNOSIS — I472 Ventricular tachycardia: Secondary | ICD-10-CM

## 2019-04-14 MED ORDER — METOPROLOL SUCCINATE ER 50 MG PO TB24: 50.0000 mg | ORAL_TABLET | Freq: Every day | ORAL | 3 refills | Status: DC

## 2019-04-14 NOTE — Telephone Encounter (Signed)
Patient and/or DPR-approved person aware of 04/14/19 AVS instructions and verbalized understanding. AVS to be released to mychart 

## 2019-04-14 NOTE — Patient Instructions (Addendum)
Medication Instructions:  Your physician has recommended you make the following change in your medication:  INCREASE YOUR METOPROLOL SUCCINATE (TOPROL XL) TO 50 MG BY MOUTH DAILY  If you need a refill on your cardiac medications before your next appointment, please call your pharmacy.   Lab work: NONE If you have labs (blood work) drawn today and your tests are completely normal, you will receive your results only by: [name] Kitchen MyChart Message (if you have MyChart) OR . A paper copy in the mail If you have any lab test that is abnormal or we need to change your treatment, we will call you to review the results.  Testing/Procedures: NONE  Follow-Up: At [facility], you and your health needs are our priority.  As part of our continuing mission to provide you with exceptional heart care, we have created designated Provider Care Teams.  These Care Teams include your primary Cardiologist (physician) and Advanced Practice Providers (APPs -  Physician Assistants and Nurse Practitioners) who all work together to provide you with the care you need, when you need it. You will need a follow up appointment in 3 months WITH DR. Allyson Sabal.  Please call our office 2 months in advance to schedule this appointment.    Any Other Special Instructions Will Be Listed Below (If Applicable). REFERRAL TO DR. CLARENCE OWEN FOR CONSIDERATION OF MITRAL VALVE REPLACEMENT. YOU WILL BE CONTACTED BY HIS OFFICE TO SET UP AN APPOINTMENT.

## 2019-04-14 NOTE — Progress Notes (Signed)
Virtual Visit via Video Note   This visit type was conducted due to national recommendations for restrictions regarding the COVID-19 Pandemic (e.g. social distancing) in an effort to limit this patient's exposure and mitigate transmission in our community.  Due to his co-morbid illnesses, this patient is at least at moderate risk for complications without adequate follow up.  This format is felt to be most appropriate for this patient at this time.  All issues noted in this document were discussed and addressed.  A limited physical exam was performed with this format.  Please refer to the patient's chart for his consent to telehealth for [facility].   Date:  04/14/2019   ID:  Darren Haynes, DOB 1986-06-03, MRN [id number]  Patient Location: Home Provider Location: Home  PCP:  [facility]  Cardiologist: Dr. Nanetta Haynes Electrophysiologist:  None   Evaluation Performed:  Follow-Up Visit  Chief Complaint: Follow-up mitral stenosis  History of Present Illness:    Darren Haynes a 33 y.o.Thin appearing married Caucasian male father 2 children who did work in Aeronautical engineer but has not worked in the last 9 months because of progressive dyspnea on exertion. He was referred by Dr. Hanley Haynes for evaluation of symptomatic mitral stenosis. I last saw him in the office  03/12/2019.Tere is no history of rheumatic fever. He has no other cardiac risk factors. He is had progressive dyspnea for the last 9 months with some nonspecific aches and pains. Is being worked up by rheumatologist. Echo performed 11/21/2018 revealed normal LV systolic function, a peak mitral gradient of 47 mmHg with a mean of 29 mmHg and mitral valve area 1.5 cm with a PA pressure of 69 mmHg. He does have severe dyspnea and lower extreme edema. He is on oral diuretic.He presented today for transesophageal echocardiogram performed Dr. Royann Haynes which showed classic rheumatic mitral stenosis with a mean gradient of  15 mmHg.  I did right left heart cath revealing a significant mitral valve gradient, and normal coronary arteries.  I referred him to Dr. Regino Schultze at [facility] who performed successful mitral balloon valvuloplasty with almost immediate improvement in his pulmonary artery pressures.  12/25/2018. He felt clinically improved.   He has complained of increasing dyspnea and some lower extremity edema.  He was placed on Lasix 80 mg a day which improved his edema.  He also had an event monitor that showed PACs, PVCs and episodes of PSVT and nonsustained ventricular tachycardia.  Since I saw him a month ago we did a transesophageal echo 03/27/2019 performed by Dr. Elease Haynes which suggested moderate to severe mitral stenosis.  Darren Haynes said that he felt clinically improved for the first month or 2 after his mitral valvuloplasty but over the last month or so has had increasing shortness of breath and edema similar to his pre-valvuloplasty symptoms and wishes to pursue mitral valve replacement for a more durable cure/intervention.  The patient does not have symptoms concerning for COVID-19 infection (fever, chills, cough, or new shortness of breath).    Past Medical History:  Diagnosis Date   Chronic back pain    Mitral valve stenosis    Panic attacks    Pulmonary hypertension (HCC)    Past Surgical History:  Procedure Laterality Date   MITRAL VALVULOPLASTY     RIGHT/LEFT HEART CATH AND CORONARY ANGIOGRAPHY N/A 12/11/2018   Procedure: RIGHT/LEFT HEART CATH AND CORONARY ANGIOGRAPHY;  Surgeon: Runell Gess, MD;  Location: [facility] INVASIVE CV LAB;  Service: Cardiovascular;  Laterality: N/A;  TEE WITHOUT CARDIOVERSION N/A 12/11/2018   Procedure: TRANSESOPHAGEAL ECHOCARDIOGRAM (TEE);  Surgeon: Thurmon Fairroitoru, Mihai, MD;  [facility];  Service: Cardiovascular;  Laterality: N/A;   TEE WITHOUT CARDIOVERSION N/A 03/27/2019   Procedure: TRANSESOPHAGEAL ECHOCARDIOGRAM (TEE);  Surgeon: Darren HashimotoNahser, Darren PingPhilip J, MD;   [facility];  Service: Cardiovascular;  Laterality: N/A;   TYMPANOSTOMY TUBE PLACEMENT       No outpatient medications have been marked as taking for the 04/14/19 encounter (Appointment) with Runell GessBerry, Kevron Patella J, MD.     Allergies:   Patient has no known allergies.   Social History   Tobacco Use   Smoking status: Never Smoker   Smokeless tobacco: Current User    Types: Snuff  Substance Use Topics   Alcohol use: No   Drug use: No     Family Hx: The patient's family history includes Depression in his mother; Heart disease in his mother; Hyperlipidemia in his father; Hypertension in his father.  ROS:   Please see the history of present illness.     All other systems reviewed and are negative.   Prior CV studies:   The following studies were reviewed today:  Transesophageal echocardiogram performed 03/27/2019  Labs/Other Tests and Data Reviewed:    EKG:  No ECG reviewed.  Recent Labs: 01/26/2019: Magnesium 2.4 03/21/2019: ALT 42; B Natriuretic Peptide 66.2 03/25/2019: BUN 19; Creatinine, Ser 1.24; Hemoglobin 13.2; Platelets 218; Potassium 3.4; Sodium 136   Recent Lipid Panel No results found for: CHOL, TRIG, HDL, CHOLHDL, LDLCALC, LDLDIRECT  Wt Readings from Last 3 Encounters:  03/27/19 213 lb (96.6 kg)  03/21/19 210 lb (95.3 kg)  03/12/19 208 lb (94.3 kg)     Objective:    Vital Signs:  There were no vitals taken for this visit.   VITAL SIGNS:  reviewed GEN:  no acute distress RESPIRATORY:  normal respiratory effort, symmetric expansion NEURO:  alert and oriented x 3, no obvious focal deficit PSYCH:  normal affect  ASSESSMENT & PLAN:    1. Rheumatic mitral stenosis- Darren Haynes is 3 months status post balloon mitral [facility] for what appears to be rheumatic mitral stenosis.  He was very symptomatic with high pulmonary artery pressures as well.  Recent echo revealed mild LV dysfunction with an EF of 45  to 50% and a transesophageal echo performed 03/27/2019 suggested moderate to severe mitral stenosis.  He did enjoy clinical benefit for approximately 1 to 2 months post valvuloplasty but has had recurrent symptoms and wishes to pursue the possibility of mitral valve replacement.  I am going refer him to Dr. Cornelius Moraswen for evaluation.  We will increase his metoprolol from 25 to 50 mg a day.  He continues on 80 mg of Lasix a day for his peripheral edema.  COVID-19 Education: The signs and symptoms of COVID-19 were discussed with the patient and how to seek care for testing (follow up with PCP or arrange E-visit).  The importance of social distancing was discussed today.  Time:   Today, I have spent 7 minutes with the patient with telehealth technology discussing the above problems.     Medication Adjustments/Labs and Tests Ordered: Current medicines are reviewed at length with the patient today.  Concerns regarding medicines are outlined above.   Tests Ordered: No orders of the defined types were placed in this encounter.   Medication Changes: No orders of the defined types were placed in this encounter.   Disposition:  Follow up in  3 month(s)  Signed, Darren Batty, MD  04/14/2019 8:05 AM    [facility]

## 2019-04-15 ENCOUNTER — Telehealth (HOSPITAL_COMMUNITY): Payer: Self-pay

## 2019-04-15 ENCOUNTER — Ambulatory Visit (HOSPITAL_COMMUNITY)
Admission: RE | Admit: 2019-04-15 | Discharge: 2019-04-15 | Disposition: A | Discharge status: Home / Self Care | Payer: BC Managed Care – PPO | Source: Ambulatory Visit | Attending: Cardiovascular Disease | Admitting: Cardiovascular Disease

## 2019-04-15 ENCOUNTER — Other Ambulatory Visit: Payer: Self-pay

## 2019-04-15 NOTE — Telephone Encounter (Signed)
Called patient to f/u on Cardiac Rehab Referral and to discuss [facility] virtual rehab. Left VM to return call.

## 2019-04-18 IMAGING — DX CHEST - 2 VIEW
2 series · 2 of 2 positions shown · non-contrast
Comparison: 01/01/2019

CLINICAL DATA: Palpitations

EXAM:
CHEST - 2 VIEW

[chest pa]
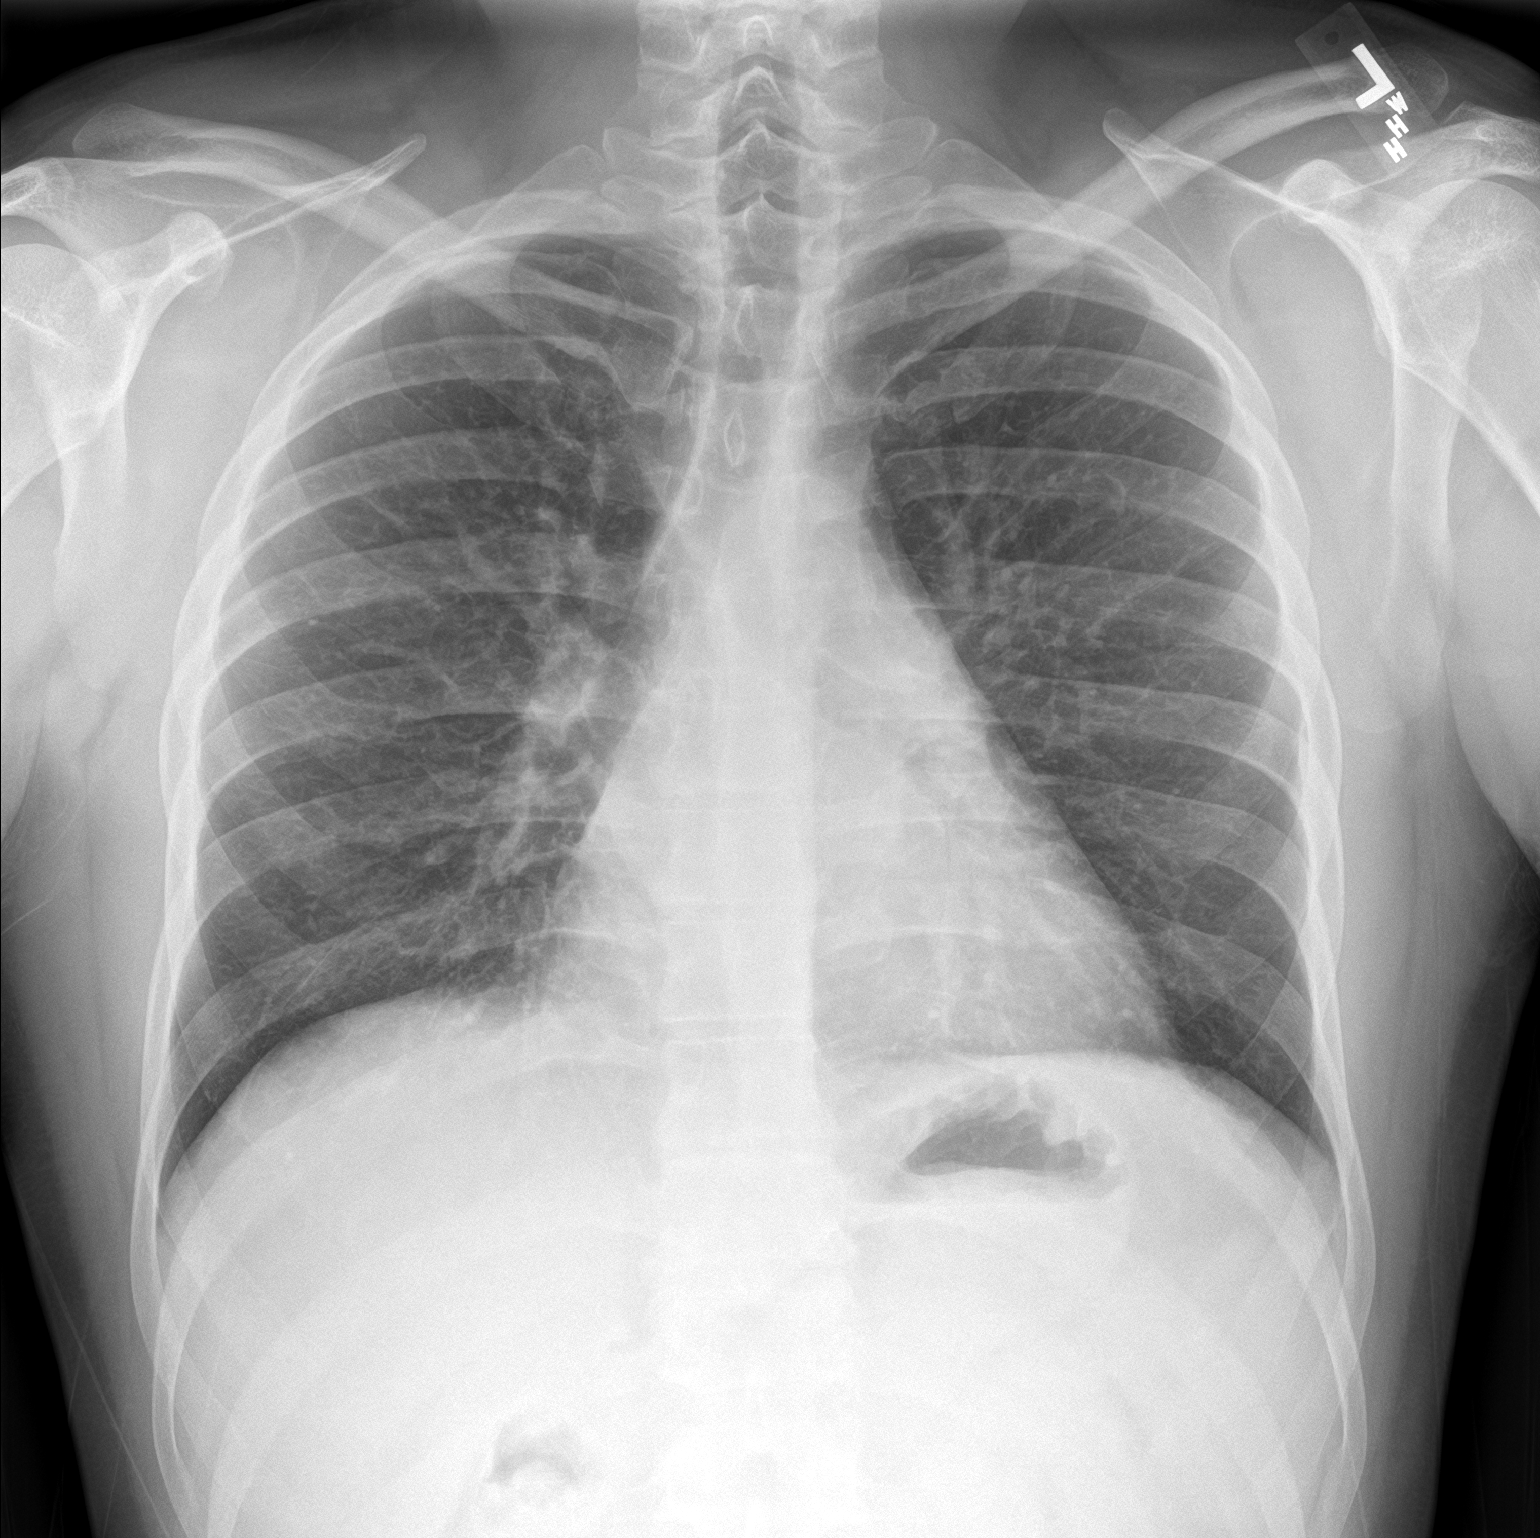

[chest lat]
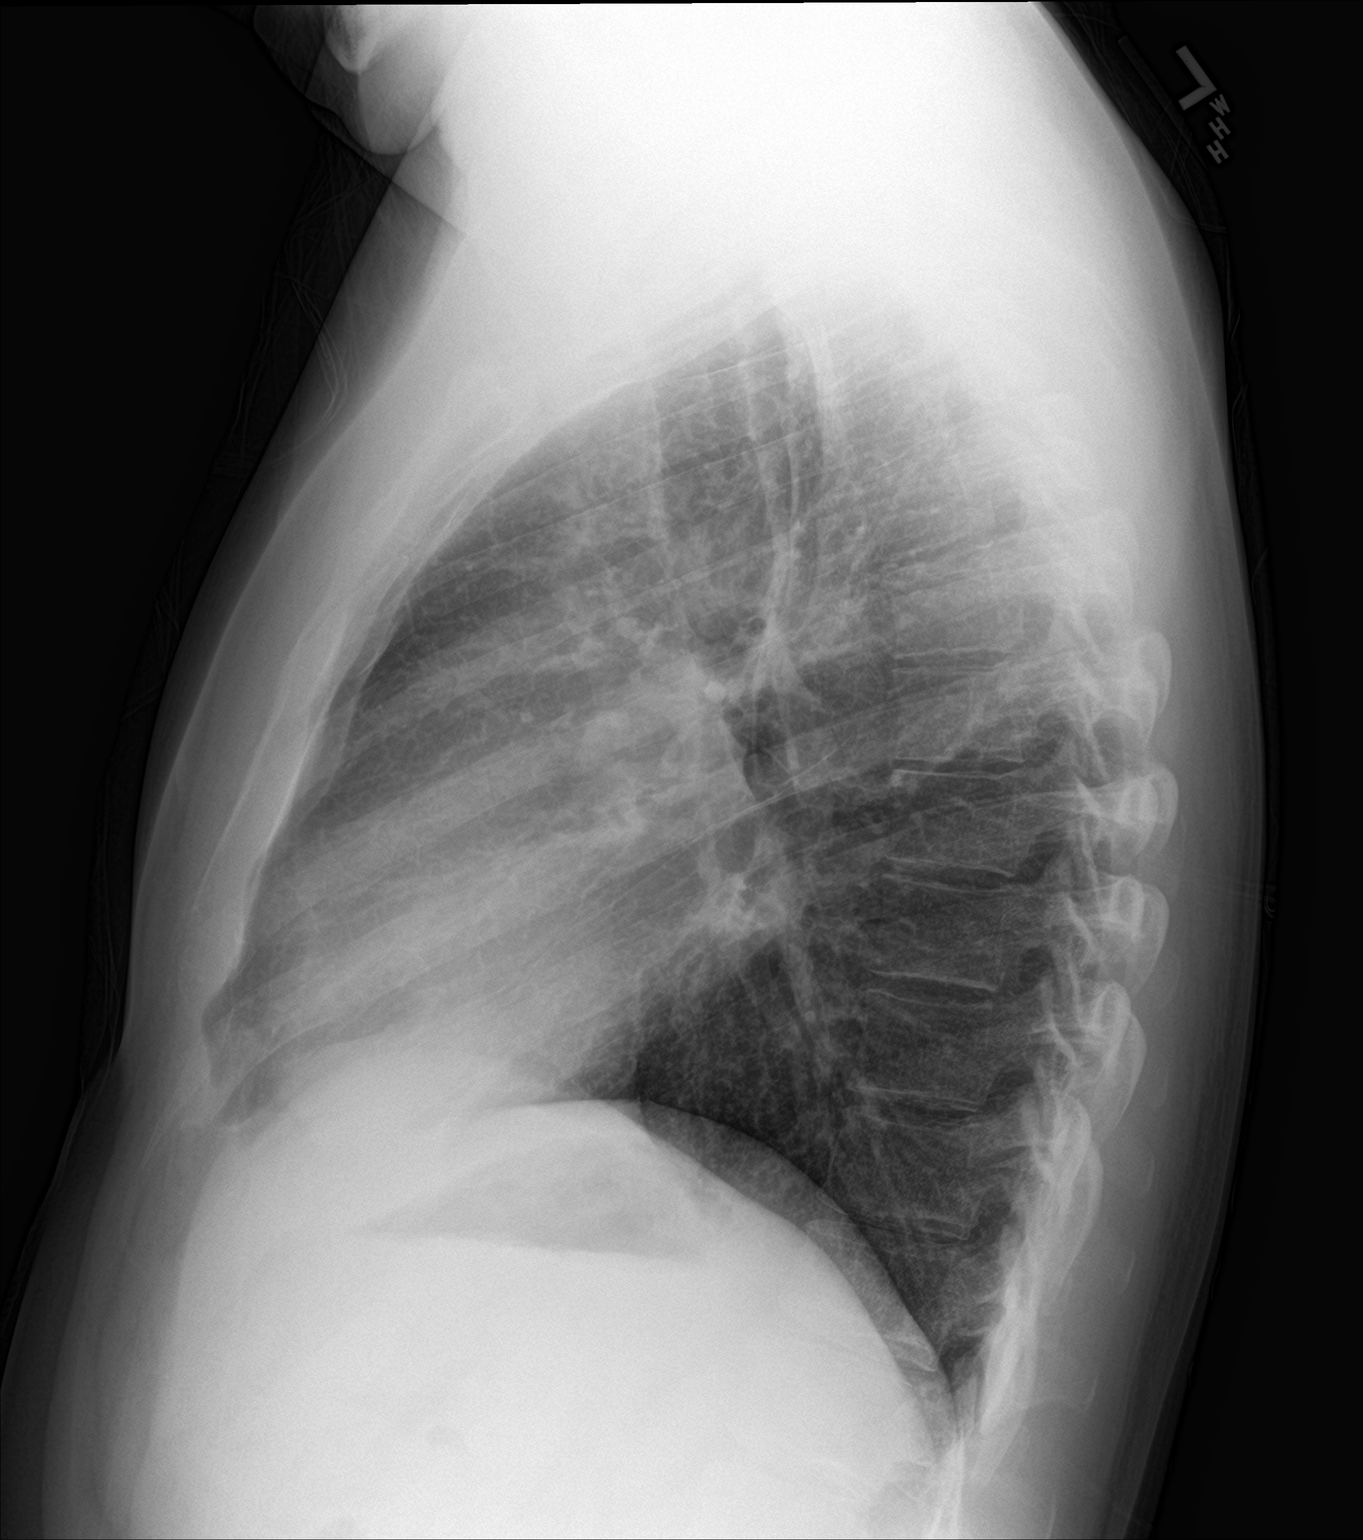

[2 of 2 positions shown; findings below may reference images not displayed]

FINDINGS: Heart and mediastinal contours are within normal limits. No focal
opacities or effusions. No acute bony abnormality.
IMPRESSION: No active cardiopulmonary disease.

## 2019-04-20 ENCOUNTER — Other Ambulatory Visit: Payer: Self-pay | Admitting: *Deleted

## 2019-04-20 ENCOUNTER — Encounter: Payer: Self-pay | Admitting: Thoracic Surgery (Cardiothoracic Vascular Surgery)

## 2019-04-20 ENCOUNTER — Telehealth (HOSPITAL_COMMUNITY): Payer: Self-pay

## 2019-04-20 ENCOUNTER — Institutional Professional Consult (permissible substitution) (INDEPENDENT_AMBULATORY_CARE_PROVIDER_SITE_OTHER): Payer: BC Managed Care – PPO | Admitting: Thoracic Surgery (Cardiothoracic Vascular Surgery)

## 2019-04-20 ENCOUNTER — Other Ambulatory Visit: Payer: Self-pay

## 2019-04-20 ENCOUNTER — Encounter: Payer: Self-pay | Admitting: *Deleted

## 2019-04-20 VITALS — BP 118/77 | HR 80 | Temp 97.7°F | Resp 16 | Ht 74.0 in | Wt 210.0 lb

## 2019-04-20 DIAGNOSIS — I050 Rheumatic mitral stenosis: Secondary | ICD-10-CM

## 2019-04-20 DIAGNOSIS — Z9889 Other specified postprocedural states: Secondary | ICD-10-CM

## 2019-04-20 NOTE — Patient Instructions (Signed)
Stop taking methotrexate 1 week prior to surgery  Continue taking all other medications without change through the day before surgery.  Have nothing to eat or drink after midnight the night before surgery.  On the morning of surgery take only Toprol XL with a sip of water.

## 2019-04-20 NOTE — Progress Notes (Signed)
[facility]ENTER  MULTIDISCIPLINARY HEART VALVE CLINIC  CARDIOTHORACIC SURGERY CONSULTATION REPORT  Referring Provider is Runell Gess, MD PCP is [facility]  Chief Complaint  Patient presents with   Mitral Stenosis    S/P MITRAL BALLOON VALVULOPLASTY 2/20 @ [facility]...ECHO 5/1/TEE 5/15/CATH 12/11/18    HPI:  Patient is a 33 year old male with rheumatic mitral stenosis, chronic diastolic congestive heart failure, pulmonary hypertension, and multiple sclerosis who has been referred for surgical consultation to discuss this treatment options for management of severe mitral stenosis that has failed an attempt at balloon mitral valvuloplasty.  Patient denies any known history of rheumatic fever during childhood although he does report that he apparently had several cases of Streptococcus.  He reports a progressive history of symptoms of shortness of breath for more than a year.  He was evaluated by multiple physicians and told there was nothing wrong with his heart.  Eventually he developed more severe shortness of breath and a chest x-ray revealed mild pulmonary edema.  He was evaluated by a physician's assistant at [facility] and referred to Dr. Hanley Hays.  Transthoracic echocardiogram revealed normal left ventricular systolic function with severe mitral stenosis and signs of pulmonary hypertension.  The patient was subsequently referred to Dr. Allyson Sabal who initially evaluated the patient in January of this year.  TEE performed December 11, 2018 revealed findings consistent with rheumatic mitral stenosis.  Mean transvalvular gradient was estimated 19 mmHg.  Valve area by pressure half-time was 0.71 cm.  Using planimetry the valve area was 0.96 cm.  By the continuity equation it measured 0.61 cm and using PISA it measured only 0.55 cm.  There was only mild mitral regurgitation in the valvuloplasty score was 5.  Left and right heart catheterization was performed by Dr.  Allyson Sabal.  There was normal coronary artery anatomy with no significant coronary artery disease.  Mean transvalvular gradient across mitral valve was estimated 16.1 mmHg.  There was moderate pulmonary hypertension with normal central venous pressure and cardiac output.  The indexed mitral valve area was 0.56 cm/m.    The patient was referred to Dr. Regino Schultze at [facility] and underwent balloon mitral valvuloplasty on December 24, 2018.  Although the procedure was technically successful, the patient was told by Dr. Regino Schultze that the improvement was less than optimal and long-term prognosis questionable.  The patient enjoyed immediate improvement in his respiratory status, but within several weeks he began to develop worsening shortness of breath.  Symptoms have continued to progress over the past 3 months, and the patient states that symptoms are almost as bad as they were prior to his valvuloplasty in February.  He now gets short of breath with low-level activity and occasionally at rest.  He has tightness across his chest that comes and goes but is essentially present most of the time.  He cannot do much of any physical activity without having to stop to catch his breath.  He cannot lay flat in bed.  He has palpitations.  He has had some mild lower extremity edema.  He reports occasional dizzy spells without syncope.  He has had increased symptoms of palpitations and previous long-term event monitor reveals sinus rhythm with sinus bradycardia, sinus tachycardia, PVCs, PACs, and occasional runs of supraventricular tachycardia and nonsustained ventricular tachycardia.  He has not had any documented atrial fibrillation nor any other sustained arrhythmias.  The patient recently underwent follow-up transthoracic and transesophageal echocardiograms which revealed some decline in left ventricular  systolic function with ejection fraction estimated only 45 to 50%.  There was at least moderate and probably severe  persistent mitral stenosis.  There was mild mitral regurgitation.  Cardiothoracic surgical consultation was requested.  Patient is married and lives with his wife in [place].  He previously worked in [employer] but he has no disabled because of recently diagnosed multiple sclerosis.  He suffers from chronic pain and numbness in both lower legs as well as occasional jerking movements consistent with mild clonus.  He has been followed at [facility] and recently referred to a multiple sclerosis specialist to consider medical therapy.  He previously had been treated with steroids for joint pain attributed to rheumatoid arthritis, although he has been advised to stay off of steroids for the last few months and is now taking methotrexate.  His mobility is moderately limited to the ambulates using a cane.  He has had a few mechanical falls without significant injury.  Past Medical History:  Diagnosis Date   Chronic back pain    Mitral valve stenosis    Panic attacks    Pulmonary hypertension ([place])     Past Surgical History:  Procedure Laterality Date   BALLOON VALVULOPLASTY  12/24/2018   balloon mitral valvuloplasty at [facility]   RIGHT/LEFT HEART CATH AND CORONARY ANGIOGRAPHY N/A 12/11/2018   Procedure: RIGHT/LEFT HEART CATH AND CORONARY ANGIOGRAPHY;  Surgeon: Lorretta Harp, MD;  Location: [place] [facility];  Service: Cardiovascular;  Laterality: N/A;   TEE WITHOUT CARDIOVERSION N/A 12/11/2018   Procedure: TRANSESOPHAGEAL ECHOCARDIOGRAM (TEE);  Surgeon: Sanda Klein, MD;  Location: [place];  Service: Cardiovascular;  Laterality: N/A;   TEE WITHOUT CARDIOVERSION N/A 03/27/2019   Procedure: TRANSESOPHAGEAL ECHOCARDIOGRAM (TEE);  Surgeon: Acie Fredrickson, Wonda Cheng, MD;  Location: [facility] ENDOSCOPY;  Service: Cardiovascular;  Laterality: N/A;   TYMPANOSTOMY TUBE PLACEMENT      Family History  Problem Relation Age of Onset   Heart disease Mother    Depression  Mother    Hypertension Father    Hyperlipidemia Father     Social History   Socioeconomic History   Marital status: Married    Spouse name: Not on file   Number of children: Not on file   Years of education: Not on file   Highest education level: Not on file  Occupational History   Not on file  Social Needs   Financial resource strain: Not on file   Food insecurity:    Worry: Not on file    Inability: Not on file   Transportation needs:    Medical: Not on file    Non-medical: Not on file  Tobacco Use   Smoking status: Never Smoker   Smokeless tobacco: Current User    Types: Snuff  Substance and Sexual Activity   Alcohol use: No   Drug use: No   Sexual activity: Not on file  Lifestyle   Physical activity:    Days per week: Not on file    Minutes per session: Not on file   Stress: Not on file  Relationships   Social connections:    Talks on phone: Not on file    Gets together: Not on file    Attends religious service: Not on file    Active member of club or organization: Not on file    Attends meetings of clubs or organizations: Not on file    Relationship status: Not on file   Intimate partner violence:    Fear  of current or ex partner: Not on file    Emotionally abused: Not on file    Physically abused: Not on file    Forced sexual activity: Not on file  Other Topics Concern   Not on file  Social History Narrative   Not on file    Current Outpatient Medications  Medication Sig Dispense Refill   ALPRAZolam (NIRAVAM) 1 MG dissolvable tablet Take 0.5 mg by mouth 3 (three) times daily as needed for anxiety.      aspirin EC 81 MG tablet Take 81 mg by mouth daily.     Buprenorphine HCl (BELBUCA) 600 MCG FILM Place 750 mcg inside cheek 2 (two) times daily.      busPIRone (BUSPAR) 7.5 MG tablet Take 7.5 mg by mouth 3 (three) times daily.     clonazePAM (KLONOPIN) 0.5 MG tablet Take 0.5 mg by mouth at bedtime.     etodolac (LODINE XL)  400 MG 24 hr tablet Take 400 mg by mouth every evening.      famotidine (PEPCID) 20 MG tablet Take 20 mg by mouth 2 (two) times daily.     fluticasone-salmeterol (ADVAIR HFA) 45-21 MCG/ACT inhaler Inhale 2 puffs into the lungs 2 (two) times daily.     folic acid (FOLVITE) 1 MG tablet Take 1 mg by mouth daily.     furosemide (LASIX) 40 MG tablet Take 1 tablet (40 mg total) by mouth 2 (two) times daily. DO NOT TAKE ON THE DAY OF YOUR PROCEDURE ON 12/11/2018 (Patient taking differently: Take 40 mg by mouth 2 (two) times daily. ) 60 tablet 3   gabapentin (NEURONTIN) 300 MG capsule Take 600 mg by mouth 3 (three) times daily.      metaxalone (SKELAXIN) 800 MG tablet Take 800 mg by mouth 4 (four) times daily.     methotrexate 2.5 MG tablet Take 20 mg by mouth once a week.      metoprolol succinate (TOPROL-XL) 50 MG 24 hr tablet Take 1 tablet (50 mg total) by mouth daily. 90 tablet 3   nortriptyline (PAMELOR) 25 MG capsule Take 25 mg by mouth at bedtime.      oxyCODONE-acetaminophen (PERCOCET) 7.5-325 MG tablet Take 1 tablet by mouth every 4 (four) hours as needed for severe pain.     penicillin v potassium (VEETID) 250 MG tablet Take 250 mg by mouth 2 (two) times a day.     potassium chloride (K-DUR,KLOR-CON) 20 MEQ tablet Take 1 tablet (20 mEq total) by mouth daily. 30 tablet 3   sertraline (ZOLOFT) 100 MG tablet Take 100 mg by mouth at bedtime.      tamsulosin (FLOMAX) 0.4 MG CAPS capsule Take 0.4 mg by mouth every evening.     Vitamin D, Ergocalciferol, (DRISDOL) 1.25 MG (50000 UT) CAPS capsule Take 50,000 Units by mouth every Tuesday.      No current facility-administered medications for this visit.     No Known Allergies    Review of Systems:   General:  decreased appetite, decreased energy, + weight gain, no weight loss, no fever  Cardiac:  + chest pain with exertion, + chest pain at rest, +SOB with low level exertion, + occasional resting SOB, + PND, + orthopnea, +  palpitations, no arrhythmia, no atrial fibrillation, + LE edema, + dizzy spells, no syncope  Respiratory:  + shortness of breath, no home oxygen, no productive cough, no dry cough, no bronchitis, + wheezing, no hemoptysis, + asthma, no pain with inspiration or cough, no  sleep apnea, no CPAP at night  GI:   no difficulty swallowing, no reflux, + frequent heartburn, no hiatal hernia, + mild abdominal pain, + constipation, no diarrhea, no hematochezia, no hematemesis, no melena  GU:   no dysuria,  no frequency, no urinary tract infection, no hematuria, no enlarged prostate, no kidney stones, no kidney disease  Vascular:  no pain suggestive of claudication, + chronic pain in legs and feet, no leg cramps, no varicose veins, no DVT, no non-healing foot ulcer  Neuro:   no stroke, no TIA's, no seizures, no headaches, no temporary blindness one eye,  no slurred speech, + peripheral neuropathy, + chronic pain, + instability of gait, + memory/cognitive dysfunction  Musculoskeletal: + arthritis, + joint swelling, + myalgias, + difficulty walking, limited mobility   Skin:   no rash, no itching, no skin infections, no pressure sores or ulcerations  Psych:   + anxiety, + depression, + nervousness, + unusual recent stress  Eyes:   + blurry vision, no floaters, + recent vision changes, + wears glasses or contacts  ENT:   + hearing loss, no loose or painful teeth, no dentures, last saw dentist July 2019  Hematologic:  no easy bruising, no abnormal bleeding, no clotting disorder, no frequent epistaxis  Endocrine:  no diabetes, does not check CBG's at home           Physical Exam:   BP 118/77 (BP Location: Right Arm, Patient Position: Sitting, Cuff Size: Normal)    Pulse 80    Temp 97.7 F (36.5 C) (Skin)    Resp 16    Ht 6\' 2"  (1.88 m)    Wt 210 lb (95.3 kg)    SpO2 98% Comment: ON RA   BMI 26.96 kg/m   General:  Thin,  well-appearing  HEENT:  Unremarkable   Neck:   no JVD, no bruits, no adenopathy    Chest:   clear to auscultation, symmetrical breath sounds, no wheezes, no rhonchi   CV:   RRR, + opening systolic click, no murmur Abdomen:  soft, non-tender, no masses   Extremities:  warm, well-perfused, pulses palpable, no LE edema  Rectal/GU  Deferred  Neuro:   Grossly non-focal and symmetrical throughout  Skin:   Clean and dry, no rashes, no breakdown   Diagnostic Tests:  Transesophageal Echocardiography  Patient:    Darren Haynes, Glantz MR #:       [id number] Study Date: 12/11/2018 Gender:     M Age:        33 Height:     182.9 cm Weight:     84 kg BSA:        2.07 m^2 Pt. Status: Room:   ORDERING     Nanetta Batty, MD  REFERRING    Nanetta Batty, MD  ADMITTING    Thurmon Fair, MD  ATTENDING    Thurmon Fair, MD  SONOGRAPHER  Ilona Sorrel  cc:  ------------------------------------------------------------------- LV EF: 55% -   60%  ------------------------------------------------------------------- Study Conclusions  - Left ventricle: The cavity size was normal. Wall thickness was   normal. Systolic function was normal. The estimated ejection   fraction was in the range of 55% to 60%. Wall motion was normal;   there were no regional wall motion abnormalities. - Mitral valve: Mild thickening of the anterior leaflet and   posterior leaflet, with shortened, mildly fused chords,   consistent with rheumatic disease. Leaflet separation was   severely reduced. Mobility was restricted. Diastolic leaflet  doming was present. The valvuloplasty score is 5 according to the   method of Abascal et al. (values 8 or below predict better   response to valvuloplasty) The findings are consistent with   severe stenosis. There was mild regurgitation directed centrally.   Planimetered valve area: 0.96 cm^2. Valve area by pressure   half-time: 0.71 cm^2. Valve area by continuity equation (using   LVOT flow): 0.61 cm^2. Valve area (PISA): 0.55 cm^2. - Left atrium: The atrium  was moderately dilated. No evidence of   thrombus in the atrial cavity or appendage. - Right atrium: No evidence of thrombus in the atrial cavity or   appendage.  ------------------------------------------------------------------- Study data:   Procedure:  Initial setup. The patient was brought to the laboratory. Surface ECG leads were monitored. Sedation. Conscious sedation was administered. Transesophageal echocardiography. Topical anesthesia was obtained using viscous lidocaine. A transesophageal probe was inserted by the attending cardiologist. Image quality was adequate.  Study completion:  The patient tolerated the procedure well. There were no complications.         Diagnostic transesophageal echocardiography.  2D and color Doppler.  Birthdate:  Patient birthdate: 10-21-1986.  Age:  Patient is 33 yr old.  Sex:  Gender: male.    BMI: 25.1 kg/m^2.  Study date:  Study date: 12/11/2018. Study time: 10:02 AM.  -------------------------------------------------------------------  ------------------------------------------------------------------- Left ventricle:  The cavity size was normal. Wall thickness was normal. Systolic function was normal. The estimated ejection fraction was in the range of 55% to 60%. Wall motion was normal; there were no regional wall motion abnormalities.  ------------------------------------------------------------------- Aortic valve:   Structurally normal valve. Trileaflet; normal thickness leaflets. Cusp separation was normal.  Doppler:  There was no significant regurgitation.  ------------------------------------------------------------------- Aorta:  There was no atheroma. There was no evidence for dissection. Aortic root: The aortic root was not dilated. Ascending aorta: The ascending aorta was normal in size. Aortic arch: The aortic arch was normal in size. Descending aorta: The descending aorta was normal in  size.  ------------------------------------------------------------------- Mitral valve:   Mild thickening of the anterior leaflet and posterior leaflet, with shortened, mildly fused chords, consistent with rheumatic disease. Leaflet separation was severely reduced. Mobility was restricted. Diastolic leaflet doming was present.  The valve was highly mobile; only tips were restricted. There was subvalvular thickening up to a third of the chordal length, near normal (4-12mm) leaflet thickness, and a single area of increased echo brightness. The valvuloplasty score is 5 according to the method of Abascal et al. [facility] 12:606, 1988). Values 8 or below predict better response to valvuloplasty than values above 8, though scores above 8 do not preclude the option of valvuloplasty. Doppler:   The findings are consistent with severe stenosis. There was mild regurgitation directed centrally.    Planimetered valve area: 0.96 cm^2. Indexed valve area by planimetry: 0.46 cm^2/m^2. Valve area by pressure half-time: 0.71 cm^2. Indexed valve area by pressure half-time: 0.34 cm^2/m^2. Valve area by continuity equation (using LVOT flow): 0.61 cm^2. Indexed valve area by continuity equation (using LVOT flow): 0.29 cm^2/m^2. Valve area (PISA): 0.55 cm^2. Indexed valve area (PISA): 0.27 cm^2/m^2.  Mean gradient (D): 19 mm Hg. Peak gradient (D): 21 mm Hg.  ------------------------------------------------------------------- Left atrium:  The atrium was moderately dilated.  No evidence of thrombus in the atrial cavity or appendage. The appendage was morphologically a left appendage, multilobulated, and of normal size. Emptying velocity was normal.  ------------------------------------------------------------------- Right ventricle:  The cavity size was normal. Wall thickness was normal.  Systolic function was normal.  ------------------------------------------------------------------- Pulmonic valve:     Structurally normal valve.  ------------------------------------------------------------------- Tricuspid valve:   Structurally normal valve.   Leaflet separation was normal.  Doppler:  There was no significant regurgitation.  ------------------------------------------------------------------- Pulmonary artery:   The main pulmonary artery was normal-sized. Systolic pressure was within the normal range.  ------------------------------------------------------------------- Right atrium:  The atrium was normal in size.  No evidence of thrombus in the atrial cavity or appendage. The appendage was morphologically a right appendage.  ------------------------------------------------------------------- Pericardium:  There was no pericardial effusion.  ------------------------------------------------------------------- Measurements   Left ventricle                           Value           Reference  Stroke volume, 2D                        62     ml       ---------  Stroke volume/bsa, 2D                    30     ml/m^2   ---------  LV filling time, D, DP                   530    ms       ---------    LVOT                                     Value           Reference  LVOT ID, S                               22.1   mm       ---------  LVOT area                                3.84   cm^2     ---------  LVOT peak velocity, S                    92.5   cm/s     ---------  LVOT mean velocity, S                    67     cm/s     ---------  LVOT VTI, S                              18     cm       ---------  Stroke volume (SV), LVOT DP              69.1   ml       ---------  Stroke index (SV/bsa), LVOT DP           33.3   ml/m^2   ---------    Left atrium                              Value  Reference  LA ID, A-P, ES                           44.41  mm       ---------  LA ID/bsa, A-P                           2.14   cm/m^2   <=2.2    Mitral valve                             Value            Reference  Mitral valve area, planimetry            0.96   cm^2     ---------  Mitral valve area/bsa, planimetry        0.46   cm^2/m^2 ---------  Mitral valve peak flow rate              191.85 ml/sec   ---------  Mitral E-wave peak velocity              228.15 cm/s     ---------  Mitral mean velocity, D                  211.11 cm/s     ---------  Mitral deceleration slope                214.76 cm/s^2   ---------  Mitral deceleration time          (H)    1062   ms       150 - 230  Mitral pressure half-time                308    ms       ---------  Mitral mean gradient, D                  19     mm Hg    ---------  Mitral peak gradient, D                  21     mm Hg    ---------  Mitral valve area, PHT, DP               0.71   cm^2     ---------  Mitral valve area/bsa, PHT, DP           0.34   cm^2/m^2 ---------  Mitral valve area, LVOT                  0.61   cm^2     ---------  continuity  Mitral valve area/bsa, LVOT              0.29   cm^2/m^2 ---------  continuity  Mitral maximal inflow velocity,          261.58 cm/s     ---------  PISA  Mitral valve area, PISA                  0.55   cm^2     ---------  Mitral valve area/bsa, PISA              0.27   cm^2/m^2 ---------  Mitral annulus VTI, D  102    cm       ---------    Pulmonary arteries                       Value           Reference  PA pressure, S, DP                       25     mm Hg    <=30    Tricuspid valve                          Value           Reference  Tricuspid regurg peak velocity           207.59 cm/s     ---------  Tricuspid peak RV-RA gradient            17     mm Hg    ---------  Tricuspid maximal regurg                 207.59 cm/s     ---------  velocity, PISA    Systemic veins                           Value           Reference  Estimated CVP                            8      mm Hg    ---------    Right ventricle                          Value           Reference  RV  pressure, S, DP                       25     mm Hg    <=30  Legend: (L)  and  (H)  mark values outside specified reference range.  ------------------------------------------------------------------- Prepared and Electronically Authenticated by  Thurmon Fair, MD 2020-01-30T11:45:39   RIGHT/LEFT HEART CATH AND CORONARY ANGIOGRAPHY  Conclusion     Hemodynamic findings consistent with mitral valve stenosis.   Adi Doro is a 33 y.o. male    [id number] LOCATION:  FACILITY: [facility]  PHYSICIAN: Nanetta Batty, M.D. 07-17-1986   DATE OF PROCEDURE:  12/11/2018  DATE OF DISCHARGE:     CARDIAC CATHETERIZATION     History obtained from chart review.Prophet Renwick a 33 y.o.. 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 He was referred by Dr. Hanley Hays for evaluation of symptomatic mitral stenosis. There is no history of rheumatic fever. He has no other cardiac risk factors. He is had progressive dyspnea for the last 9 months with some nonspecific aches and pains. Is being worked up by rheumatologist. Echo performed 11/21/2018 revealed normal LV systolic function, a peak mitral gradient of 47 mmHg with a mean of 29 mmHg and mitral valve area 1.5 cm with a PA pressure of 69 mmHg. He does have severe dyspnea and lower extreme edema. He is on oral diuretic.  He presented today for transesophageal echocardiogram performed Dr. Royann Shivers which showed classic rheumatic mitral stenosis with a mean gradient of 15 mmHg.  He presents now for right left heart cath to define his anatomy and physiology.   IMPRESSION: Mr. Hynes has severe mitral stenosis with rheumatic features, normal LV function.  His transvalvular gradient by TEE and right left heart cath for approximately 15 to 17 mmHg.  Apparently his valvular anatomy is suitable for mitral valve balloon  valvuloplasty.  I have discussed the case with Dr. Regino Schultze at [facility] who has agreed to see him for consultation and treatment.  The radial sheath was removed and a TR band was placed on the right wrist to achieve patent hemostasis.  The patient left the lab in stable condition.  He will be discharged home today as an outpatient and I will see him back in the office next week in follow-up.  Nanetta Batty. MD, [facility] 12/11/2018 11:59 AM      Surgeon Notes     12/11/2018 10:36 AM Op Note signed by Thurmon Fair, MD  Indications   Rheumatic mitral stenosis [I05.0 (ICD-10-CM)]  Procedural Details   Technical Details PROCEDURE DESCRIPTION:   The patient was brought to the second floor [facility] lab in the postabsorptive state. He was premedicated with Versed and fentanyl which was given at the time of TEE. His right antecubital fossa and wrist was prepped and shaved in usual sterile fashion. Xylocaine 1% was used for local anesthesia. A 6 French sheath was inserted into the right radial artery using standard Seldinger technique.  A 5 French sheath was inserted into the right antecubital vein.  The patient received 4000 units  of heparin intravenously.  A 5 French balloontipped Swan-Ganz catheter was then advanced through the right heart chambers obtaining sequential pressures and pulmonary artery blood sample.  A 5 [place] TIG catheter and pigtail catheters were used for selective coronary angiography and obtain left heart pressures as well as simultaneous LV/wedge pressure.  Isovue dye was used for the entirety of the case.  Retrograde aorta, left ventricular and pullback pressures were recorded.  Radial cocktail was administered via the SideArm sheath. Estimated blood loss <50 mL.   During this procedure no sedation was administered.  Medications  (Filter: Administrations occurring from 12/11/18 1049 to 12/11/18 1155)  Medication Rate/Dose/Volume Action  Date Time   Heparin  (Porcine) in NaCl 1000-0.9 UT/500ML-% SOLN (mL) 500 mL Given 12/11/18 1058   Total dose as of 04/20/19 1853 500 mL Given 1059   1,000 mL        lidocaine (PF) (XYLOCAINE) 1 % injection (mL) 2 mL Given 12/11/18 1112   Total dose as of 04/20/19 1853 2 mL Given 1112   4 mL        Radial Cocktail (Verapamil 2.5 mg, NTG, Lidocaine) (mL)  Given 12/11/18 1115   Total dose as of 04/20/19 1853        Cannot be calculated        heparin injection (Units) 4,000 Units Given 12/11/18 1129   Total dose as of 04/20/19 1853        4,000 Units        iohexol (OMNIPAQUE) 350 MG/ML injection (mL) 30 mL Given 12/11/18 1146   Total dose as of 04/20/19 1853        30 mL        0.9 % sodium chloride infusion (mL/hr) 10 mL/hr Rate/Dose Change 12/11/18 1054   Dosing weight:  84 kg        Total dose as of 04/20/19 1853        71.33 mL        Coronary Findings   Diagnostic  Dominance: Right  No diagnostic findings have been documented.  Intervention   No interventions have been documented.  Right Heart   Right Heart Pressures Hemodynamic findings consistent with mitral valve stenosis. Right atrial pressure-6/4 Right ventricular pressure-58/0 Pulmonary artery pressure- 60/26, mean 40 Pulmonary wedge pressure- A-wave 22, V wave 23, mean 20 LVEDP-2 Cardiac output-5.52 L/min by Fick Mitral valve gradient-17 mmHg  Coronary Diagrams   Diagnostic  Dominance: Right    Intervention   Implants    No implant documentation for this case.  Syngo Images   Show images for CARDIAC CATHETERIZATION  Images on Long Term Storage   Show images for [place], Darren Haynes to Procedure Log   Procedure Log    Hemo Data    Most Recent Value  Fick Cardiac Output 5.52 L/min  Fick Cardiac Output Index 2.68 (L/min)/BSA  Mitral Mean Gradient 16.11 mmHg  Mitral Peak Gradient 17 mmHg  Mitral Valve Area Index 0.56 cm2/BSA  RA A Wave 6 mmHg  RA V Wave 4 mmHg  RA Mean 3 mmHg  RV Systolic Pressure 58 mmHg  RV  Diastolic Pressure 0 mmHg  RV EDP 4 mmHg  PA Systolic Pressure 60 mmHg  PA Diastolic Pressure 26 mmHg  PA Mean 40 mmHg  PW A Wave 23 mmHg  PW V Wave 23 mmHg  PW Mean 20 mmHg  AO Systolic Pressure 96 mmHg  AO Diastolic Pressure 64 mmHg  AO Mean 76 mmHg  LV Systolic Pressure 97 mmHg  LV Diastolic Pressure 2 mmHg  LV EDP 4 mmHg  AOp Systolic Pressure 100 mmHg  AOp Diastolic Pressure 72 mmHg  AOp Mean Pressure 84 mmHg  LVp Systolic Pressure 102 mmHg  LVp Diastolic Pressure 0 mmHg  LVp EDP Pressure 3 mmHg  QP/QS 1  TPVR Index 14.94 HRUI  TSVR Index 28.39 HRUI  PVR SVR Ratio 0.27  TPVR/TSVR Ratio 0.53     BALLOON MITRAL VALVULOPLASTY  Cardiac Index (l/min/m2) 2.7 L/min/m2  Right Atrium Mean Pressure (mmHg) 5 mmHg  Right Ventricle Systolic Pressure (mmHg) 80 mmHg  Pulmonary Artery Mean Pressure (mmHg) 46 mmHg  Pulmonary Wedge Pressure (mmHg) 22 mmHg  Pulmonary Vascular Resistance (Wood units) 4.1 Wood units  Mitral Valve Stenosis Mean Gradient (mmHg) 15 mmHg  Other Result Information  This result has an attachment that is not available.  Result Narrative  Impressions:  Atrial angiogram performed for transseptal puncture planning; transseptal procedure performed using Baylis sheath and needle with RF applied x 1 [place] balloon mitral valvuloplasty performed for severe, rheumatic MS (mean MVG 14.8 mm Hg, MVA 1.2 cm2 at baseline) PBMV performed using 28 mm balloon, 2 dilations at 24 and 25 mm After 2nd dilation, there was marked reduction in MV gradient and TTE showed no MR Post PBMV, mean MVG 4.7 mm Hg, MVA 2.8 cm2  Recommendations:  Sheaths out Observe for 4 hrs then possible same day discharge  Other Result Information  Interface, Text Results In - 01/01/2019  5:10 PM EST Impressions:  Atrial angiogram performed for transseptal puncture planning; transseptal procedure performed using Baylis sheath and needle with RF applied x 1 [place]e balloon mitral valvuloplasty  performed for severe, rheumatic MS (mean MVG 14.8 mm Hg, MVA 1.2 cm2 at baseline) PBMV performed using 28 mm balloon,  2 dilations at 24 and 25 mm After 2nd dilation, there was marked reduction in MV gradient and TTE showed no MR Post PBMV, mean MVG 4.7 mm Hg, MVA 2.8 cm2     ECHOCARDIOGRAM REPORT       Patient Name:   Darren ClementMICHAEL Kersting Date of Exam: 03/13/2019 Medical Rec #:  [id number]     Height:       74.0 in Accession #:    [id number]    Weight:       208.0 lb Date of Birth:  [birth year]     BSA:          2.21 m Patient Age:    33 years      BP:           120/78 mmHg Patient Gender: M             HR:           80 bpm. Exam Location:  [address]    Procedure: 2D Echo, Color Doppler and Cardiac Doppler  Indications:    Z98.890 S/p mitral balloon valvuloplasty   History:        Patient has prior history of Echocardiogram examinations, most                 recent 11/21/2018. S/p Mitral balloon valvuloplasty and Mitral                 Stenosis Risk Factors: Former Smoker.   Sonographer:    Samule OhmWilliam Edwards RDCS Referring Phys: [phone] JONATHAN J BERRY  IMPRESSIONS    1. The left ventricle has mildly reduced systolic function, with an ejection fraction of 45-50%. The cavity size was normal. Left ventricular diastolic Doppler parameters are consistent with impaired relaxation.  2. Endocardium is difficult to see. LVEF is appears mildly decreased with inferior hypokinesis WOuld consider limited TTE with Definity to further define wall motion and LVEF.  3. Small pericardial effusion.  4. The mitral valve is abnormal. Moderate thickening of the mitral valve leaflet. Mild-moderate mitral valve stenosis.  5. MV is thickend with restricted diastolic motion. Peak and mean gradients through the valve are 20 and 8 mm Hg respectively. MVA by P T1/2 is 1.69 cm2 consistent with mild to moderate MS.  6. The aortic valve is tricuspid. Mild thickening of the aortic valve.  FINDINGS  Left  Ventricle: The left ventricle has mildly reduced systolic function, with an ejection fraction of 45-50%. The cavity size was normal. There is no increase in left ventricular wall thickness. Left ventricular diastolic Doppler parameters are  consistent with impaired relaxation. Endocardium is difficult to see. LVEF is appears mildly decreased with inferior hypokinesis WOuld consider limited TTE with Definity to further define wall motion and LVEF.  Right Ventricle: The right ventricle has mildly reduced systolic function. The cavity was normal. There is no increase in right ventricular wall thickness.  Left Atrium: Left atrial size was normal in size.  Right Atrium: Right atrial size was normal in size. Right atrial pressure is estimated at 3 mmHg.  Interatrial Septum: No atrial level shunt detected by color flow Doppler.  Pericardium: A small pericardial effusion is present.  Mitral Valve: The mitral valve is abnormal. Moderate thickening of the mitral valve leaflet. Mitral valve regurgitation is trivial by color flow Doppler. Mild-moderate mitral valve stenosis. MV is thickend with restricted diastolic motion. Peak and mean  gradients through the valve are 20 and 8 mm Hg respectively. MVA by P T1/2  is 1.69 cm2 consistent with mild to moderate MS.  Tricuspid Valve: The tricuspid valve is normal in structure. Tricuspid valve regurgitation is trivial by color flow Doppler.  Aortic Valve: The aortic valve is tricuspid Mild thickening of the aortic valve. Aortic valve regurgitation was not visualized by color flow Doppler.  Pulmonic Valve: The pulmonic valve was grossly normal. Pulmonic valve regurgitation is not visualized by color flow Doppler.    +--------------+--------++  LEFT VENTRICLE            +----------------+----------++ +--------------+--------++  Diastology                     PLAX 2D                   +----------------+----------++ +--------------+--------++  LV e'  lateral:   11.20 cm/s    LVIDd:         4.60 cm    +----------------+----------++ +--------------+--------++  LV E/e' lateral: 12.9          LVIDs:         3.20 cm    +----------------+----------++ +--------------+--------++  LV e' medial:    7.29 cm/s     LV PW:         1.00 cm    +----------------+----------++ +--------------+--------++  LV E/e' medial:  19.9          LV IVS:        1.00 cm    +----------------+----------++ +--------------+--------++  LVOT diam:     2.30 cm    +--------------+--------++  LV SV:         56 ml      +--------------+--------++  LV SV Index:   25.27      +--------------+--------++  LVOT Area:     4.15 cm   +--------------+--------++                            +--------------+--------++  +---------------+---------++  RIGHT VENTRICLE             +---------------+---------++  RV S prime:     8.38 cm/s   +---------------+---------++  TAPSE (M-mode): 1.3 cm      +---------------+---------++  +---------------+-------++-----------++  LEFT ATRIUM              Index         +---------------+-------++-----------++  LA diam:        3.60 cm  1.63 cm/m    +---------------+-------++-----------++  LA Vol (A2C):   64.9 ml  29.37 ml/m   +---------------+-------++-----------++  LA Vol (A4C):   52.6 ml  23.80 ml/m   +---------------+-------++-----------++  LA Biplane Vol: 58.9 ml  26.65 ml/m   +---------------+-------++-----------++ +------------+---------++-----------++  RIGHT ATRIUM            Index         +------------+---------++-----------++  RA Area:     13.90 cm                +------------+---------++-----------++  RA Volume:   33.80 ml   15.29 ml/m   +------------+---------++-----------++  +------------+-----------++  AORTIC VALVE               +------------+-----------++  LVOT Vmax:   84.20 cm/s    +------------+-----------++  LVOT Vmean:  60.500 cm/s   +------------+-----------++  LVOT VTI:    0.148 m        +------------+-----------++   +-------------+-------++  AORTA                   +-------------+-------++  Ao Root diam: 2.80 cm   +-------------+-------++  Ao Asc diam:  2.90 cm   +-------------+-------++  +--------------+-----------++ +---------------+-----------++  MITRAL VALVE                  TRICUSPID VALVE               +--------------+-----------++ +---------------+-----------++  MV Area (PHT): 1.71 cm       TR Peak grad:   17.1 mmHg     +--------------+-----------++ +---------------+-----------++  MV Peak grad:  20.4 mmHg      TR Vmax:        219.00 cm/s   +--------------+-----------++ +---------------+-----------++  MV Mean grad:  8.0 mmHg      +--------------+-----------++ +--------------+-------+  MV Vmax:       2.26 m/s       SHUNTS                  +--------------+-----------++ +--------------+-------+  MV Vmean:      135.0 cm/s     Systemic VTI:  0.15 m   +--------------+-----------++ +--------------+-------+  MV VTI:        0.53 m         Systemic Diam: 2.30 cm  +--------------+-----------++ +--------------+-------+  MV PHT:        128.76 msec   +--------------+-----------++  MV Decel Time: 444 msec      +--------------+-----------++ +--------------+-----------++  MV E velocity: 145.00 cm/s   +--------------+-----------++  MV A velocity: 207.00 cm/s   +--------------+-----------++  MV E/A ratio:  0.70          +--------------+-----------++  +---------+-------+  IVC                +---------+-------+  IVC diam: 0.70 cm  +---------+-------+    Dietrich Pates MD Electronically signed by Dietrich Pates MD Signature Date/Time: 03/13/2019/8:58:23 PM     TRANSESOPHOGEAL ECHO REPORT       Patient Name:   Darren Haynes Date of Exam: 03/27/2019 Medical Rec #:  [id number]     Height:       73.0 in Accession #:    [id number]    Weight:       213.0 lb Date of Birth:  30-Mar-1986     BSA:          2.21 m Patient Age:    33 years      BP:           151/72  mmHg Patient Gender: M             HR:           72 bpm. Exam Location:  Inpatient    Procedure: Transesophageal Echo  Indications:     Mitral Stenosis Evaluation   History:         Patient has prior history of Echocardiogram examinations, most                  recent 03/13/2019. Mitral Stenosis. Pulmonary hypertension.                  mitral valvuloplasty.   Sonographer:     Celene Skeen RDCS (AE) Referring Phys:  [id number] PHILIP J NAHSER Diagnosing Phys: Kristeen Miss MD     PROCEDURE: The transesophogeal probe was passed through the esophogus of the patient. The patient developed no complications during the procedure.  IMPRESSIONS    1. No evidence of a thrombus present in the left atrial appendage.  2. The mitral valve  is abnormal. Mild thickening of the mitral valve leaflet. Moderate-severe mitral valve stenosis.  3. 3 D images of the MV were obtained . Suggestive of moderate - severe mitral stenosis.  4. Aortic valve regurgitation is trivial by color flow Doppler.  5. The aortic root, ascending aorta and descending aorta are normal in size and structure.  6. The left ventricle has mildly reduced systolic function, with an ejection fraction of 45-50%.  FINDINGS  Left Ventricle: The left ventricle has mildly reduced systolic function, with an ejection fraction of 45-50%.  Left Atrium: Left atrial size was normal in size.   Left Atrial Appendage: No evidence of a thrombus present in the left atrial appendage.  Interatrial Septum: No atrial level shunt detected by color flow Doppler.  Mitral Valve: The mitral valve is abnormal. Mild thickening of the mitral valve leaflet. Mitral valve regurgitation is mild by color flow Doppler. Moderate-severe mitral valve stenosis. 3 D images of the MV were obtained . Suggestive of moderate - severe  mitral stenosis.  Tricuspid Valve: The tricuspid valve was normal in structure. Tricuspid valve regurgitation is trivial by  color flow Doppler.  Aortic Valve: The aortic valve is normal in structure. Aortic valve regurgitation is trivial by color flow Doppler.  Aorta: The aortic root and ascending aorta are normal in size and structure.    +--------------+-----------++  MITRAL VALVE                 +--------------+-----------++  MV Area (PHT): 1.29 cm      +--------------+-----------++  MV Peak grad:  18.3 mmHg     +--------------+-----------++  MV Mean grad:  11.0 mmHg     +--------------+-----------++  MV Vmax:       2.14 m/s      +--------------+-----------++  MV Vmean:      150.0 cm/s    +--------------+-----------++  MV VTI:        0.64 m        +--------------+-----------++  MV PHT:        171.00 msec   +--------------+-----------++    Kristeen Miss MD Electronically signed by Kristeen Miss MD Signature Date/Time: 03/27/2019/1:19:53 PM      Impression:  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  He presents with rapidly recurrent symptoms of exertional shortness of breath and chest discomfort consistent with chronic diastolic congestive heart failure, New [place] Heart Association functional class IIIb.  He has recently been having symptoms with minimal activity and occasionally at rest.  He cannot lay flat in bed.  I personally reviewed the patient's multiple previous echocardiograms and diagnostic cardiac catheterization.  I agree that he would probably best be treated with mitral valve replacement.  Risks associated with surgery will be adversely affected by the patient's underlying neurologic condition and need for chronic immunosuppression.     Plan:  The patient was was counseled at length regarding the indications, risks and potential benefits of mitral valve replacement.  The rationale for elective surgery has been explained, including a comparison between surgery, another  attempt at balloon mitral valvuloplasty, and continued medical therapy with close follow-up.  Alternative surgical approaches have been discussed including a comparison between conventional sternotomy and minimally-invasive techniques.  The relative risks and benefits of each have been reviewed as they pertain to the patient's specific circumstances, and expectations for the patient's postoperative convalescence has been discussed.  The potential adverse impact of the  patient's coexisting progressive neurologic disease and need for chronic immunosuppression have been discussed.  The patient desires to proceed with surgery as soon as practical.   We discussed the possibility of replacing the mitral valve using a mechanical prosthesis with the attendant need for long-term anticoagulation versus the alternative of replacing it using a bioprosthetic tissue valve with its potential for late structural valve deterioration and failure, depending upon the patient's longevity.  The patient specifically requests that if the mitral valve must be replaced that it be done using a mechanical valve.     We tentatively plan to proceed with surgery on May 14, 2019.  The patient will undergo CT angiography to evaluate the feasibility of peripheral cannulation for surgery.  The patient also was scheduled to be seen in follow-up by his neurologist at [facility] later this week.  He will discuss treatment options for management of his multiple sclerosis at that time including the likelihood of whether or not surgical treatment of his mitral valve disease might exacerbate symptoms or disease progression.  We will also request a formal physical therapy evaluation.  All of his questions been addressed.  The patient will return to our office for follow-up prior to surgery on May 11, 2019.   I spent in excess of 90 minutes during the conduct of this office consultation and >50% of this time involved direct  face-to-face encounter with the patient for counseling and/or coordination of their care.      Salvatore Decent. Cornelius Moras, MD 04/20/2019 1:29 PM

## 2019-04-20 NOTE — Telephone Encounter (Signed)
Called patient to follow up on referral to Cardiac Rehab. He says he is going to have surgery to replace his mitral valve soon and that his PCP did not advise him to participate in any exercise program at this time. I informed patient that we would get another referral on him after his surgery and that we would take him out of our home based program and wait for his referral. He verbalized understanding.

## 2019-04-21 ENCOUNTER — Ambulatory Visit (INDEPENDENT_AMBULATORY_CARE_PROVIDER_SITE_OTHER): Payer: BC Managed Care – PPO

## 2019-04-21 ENCOUNTER — Ambulatory Visit (HOSPITAL_COMMUNITY)
Admission: EM | Admit: 2019-04-21 | Discharge: 2019-04-21 | Disposition: A | Discharge status: Home / Self Care | Payer: BC Managed Care – PPO | Attending: Family Medicine | Admitting: Family Medicine

## 2019-04-21 ENCOUNTER — Encounter (HOSPITAL_COMMUNITY): Payer: Self-pay | Admitting: Emergency Medicine

## 2019-04-21 ENCOUNTER — Other Ambulatory Visit: Payer: Self-pay

## 2019-04-21 DIAGNOSIS — R05 Cough: Secondary | ICD-10-CM

## 2019-04-21 DIAGNOSIS — R059 Cough, unspecified: Secondary | ICD-10-CM

## 2019-04-21 DIAGNOSIS — R0602 Shortness of breath: Secondary | ICD-10-CM

## 2019-04-21 LAB — COMPREHENSIVE METABOLIC PANEL
ALT: 37 U/L (ref 0–44)
AST: 26 U/L (ref 15–41)
Albumin: 4.1 g/dL (ref 3.5–5.0)
Alkaline Phosphatase: 47 U/L (ref 38–126)
Anion gap: 9 (ref 5–15)
BUN: 14 mg/dL (ref 6–20)
CO2: 25 mmol/L (ref 22–32)
Calcium: 9.1 mg/dL (ref 8.9–10.3)
Chloride: 103 mmol/L (ref 98–111)
Creatinine, Ser: 1.13 mg/dL (ref 0.61–1.24)
GFR calc Af Amer: >60 mL/min (ref >60)
GFR calc non Af Amer: >60 mL/min (ref >60)
Glucose, Bld: 103 mg/dL — ABNORMAL HIGH (ref 70–99)
Potassium: 3.9 mmol/L (ref 3.5–5.1)
Sodium: 137 mmol/L (ref 135–145)
Total Bilirubin: 0.7 mg/dL (ref 0.3–1.2)
Total Protein: 7.2 g/dL (ref 6.5–8.1)

## 2019-04-21 LAB — CBC
HCT: 37.2 % — ABNORMAL LOW (ref 39.0–52.0)
Hemoglobin: 13.1 g/dL (ref 13.0–17.0)
MCH: 32 pg (ref 26.0–34.0)
MCHC: 35.2 g/dL (ref 30.0–36.0)
MCV: 91 fL (ref 80.0–100.0)
Platelets: 205 10*3/uL (ref 150–400)
RBC: 4.09 MIL/uL — ABNORMAL LOW (ref 4.22–5.81)
RDW: 14.5 % (ref 11.5–15.5)
WBC: 12.3 10*3/uL — ABNORMAL HIGH (ref 4.0–10.5)
nRBC: 0 % (ref 0.0–0.2)

## 2019-04-21 LAB — C-REACTIVE PROTEIN: CRP: 5 mg/dL — ABNORMAL HIGH (ref <1.0)

## 2019-04-21 LAB — BRAIN NATRIURETIC PEPTIDE: B Natriuretic Peptide: 122.1 pg/mL — ABNORMAL HIGH (ref 0.0–100.0)

## 2019-04-21 MED ORDER — DOXYCYCLINE HYCLATE 100 MG PO CAPS: 100.0000 mg | ORAL_CAPSULE | Freq: Two times a day (BID) | ORAL | 0 refills | Status: AC

## 2019-04-21 NOTE — ED Provider Notes (Signed)
[facility]    CSN: [id number] Arrival date & time: 04/21/19  1735     History   Chief Complaint Chief Complaint  Patient presents with   Cough    HPI Darren Haynes is a 33 y.o. male history of mitral valve stenosis, pulmonary hypertension, presenting today for evaluation of 1 day of shortness of breath and bilateral chest discomfort.  Patient states that this morning he noticed a fever of 102 .  He took oxycodone with acetaminophen prior to arrival.  He has also felt increased short of breath and discomfort in his bilateral lower ribs with breathing.  He also feels soreness throughout his chest and feels as if his chest is swollen and is holding fluid in his chest.  He has had mild cough, but states that this is not prominent.  Denies associated congestion or sore throat.  Denies nausea vomiting or diarrhea.  Notes that he typically has lower leg swelling, but he has not been walking today and has been elevating his feet.  He is concerned as his symptoms are similar to when he has had pneumonia in the past.  He currently is on Lasix daily.  As well as daily penicillin.  He has plans to have mitral valve replacement on July 12.  Tomorrow he plans to follow-up with the MS clinic as he states that neurology feels he likely has this as well.  He has weakness and difficulty walking.  He has history of frequent pneumonia-last noted 5/09 and was treated with doxycycline.  Symptoms resolved over the next week.  The symptoms did not return until today.  He has also recently been tested for COVID antibodies which were negative.  1 week ago he had the COVID swab which was negative.  Denies sick contacts or known exposure to COVID.  HPI  Past Medical History:  Diagnosis Date   Chronic back pain    Mitral valve stenosis    Panic attacks    Pulmonary hypertension [facility])     Patient Active Problem List   Diagnosis Date Noted   PSVT (paroxysmal supraventricular tachycardia) (HCC)  03/12/2019   Nonsustained ventricular tachycardia (HCC) 03/12/2019   Bilateral lower extremity edema 03/12/2019   Dyspnea on exertion 03/12/2019   Mitral stenosis 12/09/2018    Past Surgical History:  Procedure Laterality Date   BALLOON VALVULOPLASTY  12/24/2018   balloon mitral valvuloplasty at [facility]   RIGHT/LEFT HEART CATH AND CORONARY ANGIOGRAPHY N/A 12/11/2018   Procedure: RIGHT/LEFT HEART CATH AND CORONARY ANGIOGRAPHY;  Surgeon: Runell Gess, MD;  [facility];  Service: Cardiovascular;  Laterality: N/A;   TEE WITHOUT CARDIOVERSION N/A 12/11/2018   Procedure: TRANSESOPHAGEAL ECHOCARDIOGRAM (TEE);  Surgeon: Thurmon Fair, MD;  Location: [facility] ENDOSCOPY;  Service: Cardiovascular;  Laterality: N/A;   TEE WITHOUT CARDIOVERSION N/A 03/27/2019   Procedure: TRANSESOPHAGEAL ECHOCARDIOGRAM (TEE);  Surgeon: Elease Hashimoto Deloris Ping, MD;  Location: [facility] ENDOSCOPY;  Service: Cardiovascular;  Laterality: N/A;   TYMPANOSTOMY TUBE PLACEMENT         Home Medications    Prior to Admission medications   Medication Sig Start Date End Date Taking? Authorizing Provider  ALPRAZolam (NIRAVAM) 1 MG dissolvable tablet Take 0.5 mg by mouth 3 (three) times daily as needed for anxiety.     [provider]  aspirin EC 81 MG tablet Take 81 mg by mouth daily.    [provider]  Buprenorphine HCl (BELBUCA) 600 MCG FILM Place 750 mcg inside cheek 2 (two) times  daily.     [provider]  busPIRone (BUSPAR) 7.5 MG tablet Take 7.5 mg by mouth 3 (three) times daily.    [provider]  clonazePAM (KLONOPIN) 0.5 MG tablet Take 0.5 mg by mouth at bedtime.    [provider]  doxycycline (VIBRAMYCIN) 100 MG capsule Take 1 capsule (100 mg total) by mouth 2 (two) times daily for 10 days. 04/21/19 05/01/19  Braxdon Gappa C, PA-C  etodolac (LODINE XL) 400 MG 24 hr tablet Take 400 mg by mouth every evening.     [provider]  famotidine (PEPCID) 20 MG  tablet Take 20 mg by mouth 2 (two) times daily.    [provider]  fluticasone-salmeterol (ADVAIR HFA) 364-576-944945-21 MCG/ACT inhaler Inhale 2 puffs into the lungs 2 (two) times daily.    [provider]  folic acid (FOLVITE) 1 MG tablet Take 1 mg by mouth daily.    [provider]  furosemide (LASIX) 40 MG tablet Take 1 tablet (40 mg total) by mouth 2 (two) times daily. DO NOT TAKE ON THE DAY OF YOUR PROCEDURE ON 12/11/2018 Patient taking differently: Take 40 mg by mouth 2 (two) times daily.  01/29/19 04/29/19  Runell GessBerry, Jonathan J, MD  gabapentin (NEURONTIN) 300 MG capsule Take 600 mg by mouth 3 (three) times daily.     [provider]  metaxalone (SKELAXIN) 800 MG tablet Take 800 mg by mouth 4 (four) times daily.    [provider]  methotrexate 2.5 MG tablet Take 20 mg by mouth once a week.     [provider]  metoprolol succinate (TOPROL-XL) 50 MG 24 hr tablet Take 1 tablet (50 mg total) by mouth daily. 04/14/19   Runell GessBerry, Jonathan J, MD  nortriptyline (PAMELOR) 25 MG capsule Take 25 mg by mouth at bedtime.  10/24/18   [provider]  oxyCODONE-acetaminophen (PERCOCET) 7.5-325 MG tablet Take 1 tablet by mouth every 4 (four) hours as needed for severe pain.    [provider]  penicillin v potassium (VEETID) 250 MG tablet Take 250 mg by mouth 2 (two) times a day.    [provider]  potassium chloride (K-DUR,KLOR-CON) 20 MEQ tablet Take 1 tablet (20 mEq total) by mouth daily. 01/29/19   Runell GessBerry, Jonathan J, MD  sertraline (ZOLOFT) 100 MG tablet Take 100 mg by mouth at bedtime.     [provider]  tamsulosin (FLOMAX) 0.4 MG CAPS capsule Take 0.4 mg by mouth every evening.    [provider]  Vitamin D, Ergocalciferol, (DRISDOL) 1.25 MG (50000 UT) CAPS capsule Take 50,000 Units by mouth every Tuesday.     [provider]    Family History Family History  Problem Relation Age of Onset   Heart disease  Mother    Depression Mother    Hypertension Father    Hyperlipidemia Father     Social History Social History   Tobacco Use   Smoking status: Never Smoker   Smokeless tobacco: Current User    Types: Snuff  Substance Use Topics   Alcohol use: No   Drug use: No     Allergies   Patient has no known allergies.   Review of Systems Review of Systems  Constitutional: Positive for fatigue and fever. Negative for activity change, appetite change and chills.  HENT: Negative for congestion, ear pain, rhinorrhea, sinus pressure, sore throat and trouble swallowing.   Eyes: Negative for discharge and redness.  Respiratory: Positive for cough and shortness  of breath. Negative for chest tightness.   Cardiovascular: Negative for chest pain.  Gastrointestinal: Negative for abdominal pain, diarrhea, nausea and vomiting.  Musculoskeletal: Negative for myalgias.  Skin: Negative for rash.  Neurological: Negative for dizziness, light-headedness and headaches.     Physical Exam Triage Vital Signs ED Triage Vitals [04/21/19 1803]  Enc Vitals Group     BP (!) 116/56     Pulse Rate 95     Resp 18     Temp 99.3 F (37.4 C)     Temp Source Oral     SpO2 98 %     Weight      Height      Head Circumference      Peak Flow      Pain Score 7     Pain Loc      Pain Edu?      Excl. in GC?    No data found.  Updated Vital Signs BP (!) 116/56 (BP Location: Right Arm)    Pulse 95    Temp 99.3 F (37.4 C) (Oral)    Resp 18    SpO2 98%   Visual Acuity Right Eye Distance:   Left Eye Distance:   Bilateral Distance:    Right Eye Near:   Left Eye Near:    Bilateral Near:     Physical Exam Vitals signs and nursing note reviewed.  Constitutional:      Appearance: He is well-developed.  HENT:     Head: Normocephalic and atraumatic.     Ears:     Comments: Bilateral ears without tenderness to palpation of external auricle, tragus and mastoid, EAC's without erythema or swelling,  TM's with good bony landmarks and cone of light. Non erythematous.    Mouth/Throat:     Comments: Oral mucosa pink and moist, no tonsillar enlargement or exudate. Posterior pharynx patent and nonerythematous, no uvula deviation or swelling. Normal phonation. Eyes:     Conjunctiva/sclera: Conjunctivae normal.  Neck:     Musculoskeletal: Neck supple.  Cardiovascular:     Rate and Rhythm: Normal rate and regular rhythm.     Heart sounds: No murmur.  Pulmonary:     Effort: Pulmonary effort is normal. No respiratory distress.     Breath sounds: Normal breath sounds.     Comments: Breathing comfortably at rest, CTABL, no wheezing, rales or other adventitious sounds auscultated  Anterior chest without notable swelling, is diffusely tender throughout anterior chest and lower ribs bilaterally Abdominal:     Palpations: Abdomen is soft.     Tenderness: There is no abdominal tenderness.  Musculoskeletal:     Comments: No swelling noted to bilateral lower extremities  Skin:    General: Skin is warm and dry.  Neurological:     Mental Status: He is alert.      UC Treatments / Results  Labs (all labs ordered are listed, but only abnormal results are displayed) Labs Reviewed  CBC - Abnormal; Notable for the following components:      Result Value   WBC 12.3 (*)    RBC 4.09 (*)    HCT 37.2 (*)    All other components within normal limits  COMPREHENSIVE METABOLIC PANEL - Abnormal; Notable for the following components:   Glucose, Bld 103 (*)    All other components within normal limits  BRAIN NATRIURETIC PEPTIDE - Abnormal; Notable for the following components:   B Natriuretic Peptide 122.1 (*)    All other components within  normal limits  C-REACTIVE PROTEIN - Abnormal; Notable for the following components:   CRP 5.0 (*)    All other components within normal limits    EKG None  Radiology Dg Chest 2 View  Result Date: 04/21/2019 CLINICAL DATA:  Fever, dry cough, shortness of  breath. EXAM: CHEST - 2 VIEW COMPARISON:  Chest x-ray dated Mar 21, 2019. FINDINGS: The heart size and mediastinal contours are within normal limits. Normal pulmonary vascularity. No focal consolidation, pleural effusion, or pneumothorax. Opacities at the right lung base have resolved. No acute osseous abnormality. IMPRESSION: No active cardiopulmonary disease. Electronically Signed   By: Titus Dubin M.D.   On: 04/21/2019 19:01    Procedures Procedures (including critical care time)  Medications Ordered in UC Medications - No data to display  Initial Impression / Assessment and Plan / UC Course  I have reviewed the triage vital signs and the nursing notes.  Pertinent labs & imaging results that were available during my care of the patient were reviewed by me and considered in my medical decision making (see chart for details).    Chest x-ray showing resolution of previous pneumonia at right lung base.  No new pneumonia, pleural effusion or pulmonary edema noted on chest x-ray today.  Reported fever earlier today.  Patient is high risk and feels his fluid and chest discomfort has been progressive over the past couple weeks.  Lab work of CBC, CMP, BNP and CRP obtained.  CBC with slightly elevated white count of 12.3, CRP of 5.  Viral versus bacterial etiology.  Given patient high risk, history of pneumonia and feels similar in the past.  We will go ahead and cover with doxycycline for atypicals.  BNP slightly elevated at 122 compared to last results from 5/9 of 66.  Advised to continue to monitor his weights and fluid overload if feeling increased short of breath and developing increase in weight to follow-up in emergency room.  Advised to go ahead and contact cardiologist for further recommendations as well regarding his fluid.  We will keep Lasix prescription the same at this time.  Electrolytes, kidney function and liver function normal.  Called patient after lab work returned and discussed plan  for initiating doxycycline, monitoring fluid further and following up in emergency room if developing progressive or worsening symptoms.  Continue to monitor fever.  Advised to continue Tylenol and etodolac for potential chest wall discomfort associated with soreness.  Patient verbalized understanding of lab work results and plan moving forward.Discussed strict return precautions. Patient verbalized understanding and is agreeable with plan.  Final Clinical Impressions(s) / UC Diagnoses   Final diagnoses:  Cough  Shortness of breath     Discharge Instructions     I will call with results of blood work later tonight Continue with normal etodolac/ tylenol for chest tenderness and fever  Chest xray does not show pneumonia at this time or fluid Follow up if developing increased shortness of breath, persistent fevers, or increased fatigue.       ED Prescriptions    Medication Sig Dispense Auth. Provider   doxycycline (VIBRAMYCIN) 100 MG capsule Take 1 capsule (100 mg total) by mouth 2 (two) times daily for 10 days. 20 capsule Aishia Barkey C, PA-C     Controlled Substance Prescriptions [place] Controlled Substance Registry consulted? Not Applicable   Janith Lima, [place] 04/21/19 2100

## 2019-04-21 NOTE — Telephone Encounter (Signed)
Patient had visit with Dr Gwenlyn Found 04/14/19

## 2019-04-21 NOTE — Discharge Instructions (Signed)
I will call with results of blood work later [employer] Continue with normal etodolac/ tylenol for chest tenderness and fever  Chest xray does not show pneumonia at this time or fluid Follow up if developing increased shortness of breath, persistent fevers, or increased fatigue.

## 2019-04-21 NOTE — ED Triage Notes (Signed)
Pt here with cough and pain with cough and inspiration x 1 day; pt sts hx of PNA in past; pt with hx of pulmonary hypertension

## 2019-04-24 ENCOUNTER — Ambulatory Visit
Admission: RE | Admit: 2019-04-24 | Discharge: 2019-04-24 | Disposition: A | Discharge status: Home / Self Care | Payer: BC Managed Care – PPO | Source: Ambulatory Visit | Attending: Thoracic Surgery (Cardiothoracic Vascular Surgery) | Admitting: Thoracic Surgery (Cardiothoracic Vascular Surgery)

## 2019-04-24 DIAGNOSIS — I050 Rheumatic mitral stenosis: Secondary | ICD-10-CM

## 2019-04-24 MED ORDER — IOPAMIDOL (ISOVUE-370) INJECTION 76%
75.0000 mL | Freq: Once | INTRAVENOUS | Status: AC | PRN
  Administered 2019-04-24: 08:00:00 75 mL via INTRAVENOUS

## 2019-04-28 ENCOUNTER — Telehealth: Payer: Self-pay | Admitting: Cardiovascular Disease

## 2019-04-28 NOTE — Telephone Encounter (Signed)
STAT if HR is under 50 or over 120 (normal HR is 60-100 beats per minute)  1) What is your heart rate? 176 and blood pressure was 120/76*  2) Do you have a log of your heart rate readings (document readings)?  yes 3) Do you have any other symptoms? no

## 2019-04-28 NOTE — Telephone Encounter (Signed)
Received call from patient he stated he just left Neurology office he had a spinal tap done this morning.Stated he was told to call Dr.Berry to let him know his pulse was 176 this morning B/P 120/76.Stated his pulse presently is 130.He has not took Metoprolol yet.Advised when he gets home take Metoprolol 50 mg as prescribed.Advised to call back if he continues to have a fast heart beat.

## 2019-04-30 ENCOUNTER — Encounter: Payer: Self-pay | Admitting: Physical Therapy

## 2019-04-30 ENCOUNTER — Other Ambulatory Visit: Payer: Self-pay

## 2019-04-30 ENCOUNTER — Ambulatory Visit
Payer: BC Managed Care – PPO | Attending: Thoracic Surgery (Cardiothoracic Vascular Surgery) | Admitting: Physical Therapy

## 2019-04-30 DIAGNOSIS — R262 Difficulty in walking, not elsewhere classified: Secondary | ICD-10-CM | POA: Insufficient documentation

## 2019-04-30 NOTE — Therapy (Signed)
[place], [place], [address] Phone: [phone]   Fax:  [phone]  Physical Therapy Evaluation/Pre-MVR  Patient Details  Name: Darren Haynes MRN: [id number] Date of Birth: [birth year] Referring Provider (PT): Rexene Alberts, MD   Encounter Date: 04/30/2019  PT End of Session - 04/30/19 1322    Visit Number  1    PT Start Time  9924    PT Stop Time  1315    PT Time Calculation (min)  40 min    Activity Tolerance  Treatment limited secondary to medical complications (Comment)    Behavior During Therapy  [facility] for tasks assessed/performed       Past Medical History:  Diagnosis Date  . Chronic back pain   . Mitral valve stenosis   . Panic attacks   . Pulmonary hypertension ([place])     Past Surgical History:  Procedure Laterality Date  . BALLOON VALVULOPLASTY  12/24/2018   balloon mitral valvuloplasty at [facility]  . RIGHT/LEFT HEART CATH AND CORONARY ANGIOGRAPHY N/A 12/11/2018   Procedure: RIGHT/LEFT HEART CATH AND CORONARY ANGIOGRAPHY;  Surgeon: Lorretta Harp, MD;  Location: [place] CV LAB;  Service: Cardiovascular;  Laterality: N/A;  . TEE WITHOUT CARDIOVERSION N/A 12/11/2018   Procedure: TRANSESOPHAGEAL ECHOCARDIOGRAM (TEE);  Surgeon: Sanda Klein, MD;  Location: [place];  Service: Cardiovascular;  Laterality: N/A;  . TEE WITHOUT CARDIOVERSION N/A 03/27/2019   Procedure: TRANSESOPHAGEAL ECHOCARDIOGRAM (TEE);  Surgeon: Acie Fredrickson Wonda Cheng, MD;  Location: [facility] ENDOSCOPY;  Service: Cardiovascular;  Laterality: N/A;  . TYMPANOSTOMY TUBE PLACEMENT      There were no vitals filed for this visit.   Subjective Assessment - 04/30/19 1245    Subjective  A lot of SOB and leg swelling. My chest has been swelling. Running tests for MS. Rapid heart beating. Symptoms on/off in 10 years, worsened in last 3 years. Baloon angioplasty not sucessful.    Currently in Pain?  Yes    Pain Score  7     Pain  Location  --   from the neck-down   Pain Orientation  Right;Left    Pain Descriptors / Indicators  --   pain   Pain Type  Chronic pain    Aggravating Factors   always hurts    Pain Relieving Factors  rest         [facility] PT Assessment - 04/30/19 0001      Assessment   Medical Diagnosis  mitral stenosis    Referring Provider (PT)  Rexene Alberts, MD    Onset Date/Surgical Date  --   10 years, worsening in last 3   Hand Dominance  Right      Precautions   Precautions  None      Restrictions   Weight Bearing Restrictions  No      Balance Screen   Has the patient fallen in the past 6 months  Yes    How many times?  8    Has the patient had a decrease in activity level because of a fear of falling?   Yes    Is the patient reluctant to leave their home because of a fear of falling?   No      Home Environment   Living Environment  Private residence    Living Arrangements  Spouse/significant other;Children    Additional Comments  no stairs      Prior Function   Level of Independence  Independent  Vocation  Unemployed      Cognition   Overall Cognitive Status  Within Functional Limits for tasks assessed      Sensation   Additional Comments  N/T in extremities, burning in neck      Posture/Postural Control   Posture Comments  forward head, rounded shoulders      ROM / Strength   AROM / PROM / Strength  AROM;Strength      AROM   Overall AROM Comments  approx 80% with difficulty in all extremities      Strength   Overall Strength Comments  gross shoulder strength 4-/5    Strength Assessment Site  Shoulder;Hip;Knee;Hand    Right/Left Shoulder  Left    Right/Left hand  Right;Left    Right Hand Grip (lbs)  90    Left Hand Grip (lbs)  35    Right/Left Hip  Left    Left Hip Flexion  4-/5    Right/Left Knee  Left    Left Knee Flexion  3+/5    Left Knee Extension  3+/5       [facility] Pre-Surgical Assessment - 04/30/19 0001    5 Meter Walk Test- trial 1  5 sec    5  Meter Walk Test- trial 2  5 sec.     5 Meter Walk Test- trial 3  5 sec.    5 meter walk test average  5 sec    4 Stage Balance Test Position  1    Sit To Stand Test- trial 1  31 sec.    6 Minute Walk- Baseline  yes    BP (mmHg)  122/78    HR (bpm)  69    02 Sat (%RA)  98 %    Modified Borg Scale for Dyspnea  0- Nothing at all    Perceived Rate of Exertion (Borg)  6-    6 Minute Walk Post Test  yes    BP (mmHg)  (!) 131/91    HR (bpm)  78    02 Sat (%RA)  99 %    Modified Borg Scale for Dyspnea  3- Moderate shortness of breath or breathing difficulty    Perceived Rate of Exertion (Borg)  17- Very hard    Aerobic Endurance Distance Walked  730    Endurance additional comments  age normative values unavailable              Objective measurements completed on examination: See above findings.                           Plan - 04/30/19 1323    PT Frequency  --   one time pre-MVR evaluation   Consulted and Agree with Plan of Care  Patient       Clinical Impression Statement: Pt is a 33 yo M presenting to OP PT for evaluation prior to possible MVR surgery. Pt reports onset of SOB and chest pain 10 years ago, worsening in last 3. Symptoms are  limiting functional ambulation. Pt presents with limited ROM and strength and diffuse musculoskeletal pain.  Pt ambulated 200 feet in 1:15 before requesting a seated rest beak lasting 1:10 min. At time of rest, patient's HR was 100 bpm and O2 was 99 on room air. Pt reported 3/10 shortness of breath on modified scale for dyspnea. Pt able to resume after rest and ambulate an additional 530 feet before requesting a rest break with 54  seconds left in the walk and sat until timer ended.Pt ambulated a total of 730 feet in 6 minute walk and reported 3/10 SOB on modified scale for dyspena and 17/20 RPE on Borg's perceived exertion and pain scale at the end of the walk. During the 6 minute walk test, patient's HR increased to 100 BPM  and O2 saturation decreased to 99%. Based on the Short Physical Performance Battery, patient has a frailty rating of 6/12 with </= 5/12 considered frail.  Visit Diagnosis: 1. Difficulty in walking, not elsewhere classified        Problem List Patient Active Problem List   Diagnosis Date Noted  . PSVT (paroxysmal supraventricular tachycardia) (HCC) 03/12/2019  . Nonsustained ventricular tachycardia (HCC) 03/12/2019  . Bilateral lower extremity edema 03/12/2019  . Dyspnea on exertion 03/12/2019  . Mitral stenosis 12/09/2018  Fahd Galea C. Tamina Cyphers PT, DPT 04/30/19 1:33 PM   [facility] [address] [place], [place], [address] Phone: [phone]   Fax:  [phone]  Name: Darren Haynes MRN: [id number] Date of Birth: [birth year]

## 2019-05-05 NOTE — Telephone Encounter (Signed)
Follow up ° ° °Patient is returning your call. Please call. ° ° ° °

## 2019-05-05 NOTE — Telephone Encounter (Signed)
Does not appear that pt contacted office with any updates on status. Called pt to check on current status. LMCTB

## 2019-05-06 NOTE — Telephone Encounter (Signed)
Spoke with pt and heart rate continues to run 110-120 even with taking the Metoprolol 50 mg daily but pt notes B/P of 100/55 after taking the Metoprolol pt also continues  to have leg swelling and SOB Pt is scheduled for MV repair with Dr Roxy Manns on 7/2 Will forward to Dr Gwenlyn Found for review .Adonis Housekeeper

## 2019-05-06 NOTE — Telephone Encounter (Signed)
Follow up   Patient states that he is returning a call. Please call.

## 2019-05-06 NOTE — Telephone Encounter (Signed)
Not much room to get work with medications given his low blood blood pressure.  His surgery is scheduled for 1 week from now.  I will not make any medication changes currently

## 2019-05-07 ENCOUNTER — Emergency Department (HOSPITAL_COMMUNITY)
Admission: EM | Admit: 2019-05-07 | Discharge: 2019-05-08 | Disposition: A | Discharge status: Home / Self Care | Payer: BC Managed Care – PPO | Attending: Emergency Medicine | Admitting: Emergency Medicine

## 2019-05-07 ENCOUNTER — Other Ambulatory Visit: Payer: Self-pay

## 2019-05-07 ENCOUNTER — Emergency Department (HOSPITAL_COMMUNITY): Payer: BC Managed Care – PPO

## 2019-05-07 DIAGNOSIS — Z79899 Other long term (current) drug therapy: Secondary | ICD-10-CM | POA: Diagnosis not present

## 2019-05-07 DIAGNOSIS — Z7982 Long term (current) use of aspirin: Secondary | ICD-10-CM | POA: Diagnosis not present

## 2019-05-07 DIAGNOSIS — F17220 Nicotine dependence, chewing tobacco, uncomplicated: Secondary | ICD-10-CM | POA: Diagnosis not present

## 2019-05-07 DIAGNOSIS — R0789 Other chest pain: Secondary | ICD-10-CM | POA: Insufficient documentation

## 2019-05-07 DIAGNOSIS — R0602 Shortness of breath: Secondary | ICD-10-CM

## 2019-05-07 DIAGNOSIS — R42 Dizziness and giddiness: Secondary | ICD-10-CM | POA: Insufficient documentation

## 2019-05-07 LAB — CBC WITH DIFFERENTIAL/PLATELET
Abs Immature Granulocytes: 0.04 10*3/uL (ref 0.00–0.07)
Basophils Absolute: 0 10*3/uL (ref 0.0–0.1)
Basophils Relative: 0 %
Eosinophils Absolute: 0.1 10*3/uL (ref 0.0–0.5)
Eosinophils Relative: 1 %
HCT: 36.9 % — ABNORMAL LOW (ref 39.0–52.0)
Hemoglobin: 12.7 g/dL — ABNORMAL LOW (ref 13.0–17.0)
Immature Granulocytes: 0 %
Lymphocytes Relative: 11 %
Lymphs Abs: 1.2 10*3/uL (ref 0.7–4.0)
MCH: 32.2 pg (ref 26.0–34.0)
MCHC: 34.4 g/dL (ref 30.0–36.0)
MCV: 93.7 fL (ref 80.0–100.0)
Monocytes Absolute: 0.6 10*3/uL (ref 0.1–1.0)
Monocytes Relative: 6 %
Neutro Abs: 9.2 10*3/uL — ABNORMAL HIGH (ref 1.7–7.7)
Neutrophils Relative %: 82 %
Platelets: 148 10*3/uL — ABNORMAL LOW (ref 150–400)
RBC: 3.94 MIL/uL — ABNORMAL LOW (ref 4.22–5.81)
RDW: 13.5 % (ref 11.5–15.5)
WBC: 11.2 10*3/uL — ABNORMAL HIGH (ref 4.0–10.5)
nRBC: 0 % (ref 0.0–0.2)

## 2019-05-07 LAB — BASIC METABOLIC PANEL
Anion gap: 10 (ref 5–15)
BUN: 14 mg/dL (ref 6–20)
CO2: 27 mmol/L (ref 22–32)
Calcium: 8.9 mg/dL (ref 8.9–10.3)
Chloride: 100 mmol/L (ref 98–111)
Creatinine, Ser: 1.15 mg/dL (ref 0.61–1.24)
GFR calc Af Amer: >60 mL/min (ref >60)
GFR calc non Af Amer: >60 mL/min (ref >60)
Glucose, Bld: 95 mg/dL (ref 70–99)
Potassium: 3.5 mmol/L (ref 3.5–5.1)
Sodium: 137 mmol/L (ref 135–145)

## 2019-05-07 LAB — BRAIN NATRIURETIC PEPTIDE: B Natriuretic Peptide: 168.8 pg/mL — ABNORMAL HIGH (ref 0.0–100.0)

## 2019-05-07 LAB — TROPONIN I (HIGH SENSITIVITY): Troponin I (High Sensitivity): 4 ng/L (ref <18)

## 2019-05-07 MED ORDER — MORPHINE SULFATE (PF) 4 MG/ML IV SOLN
4.0000 mg | Freq: Once | INTRAVENOUS | Status: AC
  Administered 2019-05-07: 4 mg via INTRAVENOUS
  Filled 2019-05-07: qty 1

## 2019-05-07 MED ORDER — HYDROXYZINE HCL 25 MG PO TABS
25.0000 mg | ORAL_TABLET | Freq: Once | ORAL | Status: AC
  Administered 2019-05-07: 25 mg via ORAL
  Filled 2019-05-07: qty 1

## 2019-05-07 MED ORDER — ONDANSETRON HCL 4 MG/2ML IJ SOLN
4.0000 mg | Freq: Once | INTRAMUSCULAR | Status: AC
  Administered 2019-05-07: 4 mg via INTRAVENOUS
  Filled 2019-05-07: qty 2

## 2019-05-07 NOTE — ED Triage Notes (Signed)
[facility] reports that the patient is felling left sided chest pain, shortness of breath, and dizziness. According to EMS, he has an extensive cardiac and pulmonary medical hx including pulmonary hypertension, fluid around the heart and mitral balloon valve plasty. His last vitals were 129/66, 80 HR NSR with PACs, 98% RA and respiratory rate of 22.

## 2019-05-07 NOTE — Discharge Instructions (Signed)
Please call and follow up with your cardiologist for further management of your condition.  Good luck with your valvular surgery in the near future.  Return if you have any concerns.

## 2019-05-07 NOTE — ED Notes (Signed)
Spouse is at bedside per approval of charge nurse.

## 2019-05-07 NOTE — ED Notes (Signed)
Pt spouse Zyan Coby) is available at [phone] for further questions and update.

## 2019-05-07 NOTE — ED Provider Notes (Signed)
[facility]   CSN: [id number] Arrival date & time: 05/07/19  2125     History   Chief Complaint Chief Complaint  Patient presents with  . Shortness of Breath    HPI Darren Haynes is a 33 y.o. male.     The history is provided by the patient and medical records. No language interpreter was used.  Shortness of Breath    33 year old male with history of mitral valve stenosis, pulmonary hypertension, panic attack, chronic back pain brought here via EMS from home for evaluation of chest pain shortness of breath.  Patient report for the past 4 hours that he has had persistent left-sided chest pain which he described as a pressure sensation with associated shortness of breath and feeling dizzy.  Pain with occasional sharp shooting episodes.  Rates his pain is 9 out of 10.  Pain is nonexertional.  He also felt that his leg has been swelling bilaterally for the past several weeks.  He denies having fever, productive cough, hemoptysis, nausea, vomiting, abdominal pain no back pain.  Patient report he is scheduled to have mitral valve repair next week but at this time he feels as if he is going to die.  He has been tested several times for COVID-19 and it has been negative.  Past Medical History:  Diagnosis Date  . Chronic back pain   . Mitral valve stenosis   . Panic attacks   . Pulmonary hypertension [facility](HCC)     Patient Active Problem List   Diagnosis Date Noted  . PSVT (paroxysmal supraventricular tachycardia) (HCC) 03/12/2019  . Nonsustained ventricular tachycardia (HCC) 03/12/2019  . Bilateral lower extremity edema 03/12/2019  . Dyspnea on exertion 03/12/2019  . Mitral stenosis 12/09/2018    Past Surgical History:  Procedure Laterality Date  . BALLOON VALVULOPLASTY  12/24/2018   [facility]  . RIGHT/LEFT HEART CATH AND CORONARY ANGIOGRAPHY N/A 12/11/2018   Procedure: RIGHT/LEFT HEART CATH AND CORONARY  ANGIOGRAPHY;  Surgeon: Runell GessBerry, Jonathan J, MD;  Location: [facility] INVASIVE CV LAB;  Service: Cardiovascular;  Laterality: N/A;  . TEE WITHOUT CARDIOVERSION N/A 12/11/2018   Procedure: TRANSESOPHAGEAL ECHOCARDIOGRAM (TEE);  Surgeon: Thurmon Fairroitoru, Mihai, MD;  [facility];  Service: Cardiovascular;  Laterality: N/A;  . TEE WITHOUT CARDIOVERSION N/A 03/27/2019   Procedure: TRANSESOPHAGEAL ECHOCARDIOGRAM (TEE);  Surgeon: Elease HashimotoNahser, Deloris PingPhilip J, MD;  [facility];  Service: Cardiovascular;  Laterality: N/A;  . TYMPANOSTOMY TUBE PLACEMENT          Home Medications    Prior to Admission medications   Medication Sig Start Date End Date Taking? Authorizing Provider  ALPRAZolam (NIRAVAM) 1 MG dissolvable tablet Take 0.5 mg by mouth 3 (three) times daily as needed for anxiety.     [provider]  aspirin EC 81 MG tablet Take 81 mg by mouth daily.    [provider]  Buprenorphine HCl (BELBUCA) 750 MCG FILM Place 750 mcg inside cheek 2 (two) times daily.     [provider]  busPIRone (BUSPAR) 7.5 MG tablet Take 7.5 mg by mouth 3 (three) times daily.    [provider]  clonazePAM (KLONOPIN) 0.5 MG tablet Take 0.5 mg by mouth at bedtime.    [provider]  etodolac (LODINE XL) 400 MG 24 hr tablet Take 400 mg by mouth every evening.     [provider]  famotidine (PEPCID) 20 MG tablet Take 20 mg by mouth 2 (two) times daily  as needed for heartburn or indigestion.     [provider]  fluticasone-salmeterol (ADVAIR HFA) [phone] MCG/ACT inhaler Inhale 2 puffs into the lungs 2 (two) times daily.    [provider]  folic acid (FOLVITE) 1 MG tablet Take 1 mg by mouth daily.    [provider]  furosemide (LASIX) 40 MG tablet Take 1 tablet (40 mg total) by mouth 2 (two) times daily. DO NOT TAKE ON THE DAY OF YOUR PROCEDURE ON 12/11/2018 Patient taking differently: Take 40 mg by mouth 2 (two) times daily.  01/29/19 05/05/19   Lorretta Harp, MD  gabapentin (NEURONTIN) 300 MG capsule Take 600 mg by mouth 3 (three) times daily.     [provider]  MELATONIN PO Take 1 tablet by mouth at bedtime as needed (sleep).    [provider]  methotrexate 2.5 MG tablet Take 20 mg by mouth once a week.     [provider]  metoprolol succinate (TOPROL-XL) 50 MG 24 hr tablet Take 1 tablet (50 mg total) by mouth daily. 04/14/19   Lorretta Harp, MD  nortriptyline (PAMELOR) 25 MG capsule Take 25 mg by mouth at bedtime.  10/24/18   [provider]  oxyCODONE-acetaminophen (PERCOCET) 7.5-325 MG tablet Take 1 tablet by mouth every 4 (four) hours as needed for severe pain.    [provider]  penicillin v potassium (VEETID) 250 MG tablet Take 250 mg by mouth 2 (two) times a day.    [provider]  potassium chloride (K-DUR,KLOR-CON) 20 MEQ tablet Take 1 tablet (20 mEq total) by mouth daily. 01/29/19   Lorretta Harp, MD  sertraline (ZOLOFT) 100 MG tablet Take 100 mg by mouth at bedtime.     [provider]  tamsulosin (FLOMAX) 0.4 MG CAPS capsule Take 0.4 mg by mouth every evening.    [provider]  tiZANidine (ZANAFLEX) 4 MG tablet Take 4 mg by mouth 3 (three) times daily. 04/22/19   [provider]  Vitamin D, Ergocalciferol, (DRISDOL) 1.25 MG (50000 UT) CAPS capsule Take 50,000 Units by mouth every Tuesday.     [provider]    Family History Family History  Problem Relation Age of Onset  . Heart disease Mother   . Depression Mother   . Hypertension Father   . Hyperlipidemia Father     Social History Social History   Tobacco Use  . Smoking status: Never Smoker  . Smokeless tobacco: Current User    Types: Snuff  Substance Use Topics  . Alcohol use: No  . Drug use: No     Allergies   Patient has no known allergies.   Review of Systems Review of Systems  Respiratory: Positive for shortness of breath.   All other  systems reviewed and are negative.    Physical Exam Updated Vital Signs BP (!) 141/54 (BP Location: Left Arm)   Pulse 86   Temp 98.5 F (36.9 C) (Oral)   Resp 14   SpO2 100%   Physical Exam Vitals signs and nursing note reviewed.  Constitutional:      General: He is not in acute distress.    Appearance: He is well-developed.     Comments: Patient appears uncomfortable but nontoxic  HENT:     Head: Atraumatic.  Eyes:     Conjunctiva/sclera: Conjunctivae normal.  Neck:     Musculoskeletal: Neck supple.  Cardiovascular:     Rate and Rhythm: Normal rate and regular rhythm.  Pulmonary:     Effort: Pulmonary effort is normal.     Breath sounds: Normal breath sounds. No decreased breath sounds, wheezing, rhonchi or rales.  Chest:     Chest wall: No tenderness.  Abdominal:     Palpations: Abdomen is soft.     Tenderness: There is no abdominal tenderness.  Musculoskeletal:     Right lower leg: No edema.     Left lower leg: No edema.  Skin:    Findings: No rash.  Neurological:     Mental Status: He is alert and oriented to person, place, and time.  Psychiatric:        Mood and Affect: Mood normal.      ED Treatments / Results  Labs (all labs ordered are listed, but only abnormal results are displayed) Labs Reviewed  CBC WITH DIFFERENTIAL/PLATELET - Abnormal; Notable for the following components:      Result Value   WBC 11.2 (*)    RBC 3.94 (*)    Hemoglobin 12.7 (*)    HCT 36.9 (*)    Platelets 148 (*)    Neutro Abs 9.2 (*)    All other components within normal limits  BRAIN NATRIURETIC PEPTIDE - Abnormal; Notable for the following components:   B Natriuretic Peptide 168.8 (*)    All other components within normal limits  NOVEL CORONAVIRUS, NAA (HOSPITAL ORDER, SEND-OUT TO REF LAB)  BASIC METABOLIC PANEL  TROPONIN I (HIGH SENSITIVITY)  TROPONIN I (HIGH SENSITIVITY)    EKG EKG Interpretation  Date/Time:  Thursday May 07 2019 22:19:59 EDT Ventricular  Rate:  77 PR Interval:    QRS Duration: 85 QT Interval:  369 QTC Calculation: 418 R Axis:   75 Text Interpretation:  Sinus rhythm Probable left atrial enlargement similar to prior 5/20 Confirmed by Meridee Score [phone]) on 05/07/2019 10:35:58 PM   ED ECG REPORT   Date: 05/07/2019  Rate: 77  Rhythm: normal sinus rhythm  QRS Axis: normal  Intervals: normal  ST/T Wave abnormalities: normal  Conduction Disutrbances:none  Narrative Interpretation:   Old EKG Reviewed: unchanged  I have personally reviewed the EKG tracing and agree with the computerized printout as noted.   Radiology Dg Chest Port 1 View  Result Date: 05/07/2019 CLINICAL DATA:  Left-sided chest pain and shortness of breath. EXAM: PORTABLE CHEST 1 VIEW COMPARISON:  April 21, 2019 FINDINGS: Heart size is borderline enlarged which may be secondary to low lung volumes. There is no pneumothorax. No large pleural effusion. No acute osseous abnormality. There is no infiltrate. IMPRESSION: No active disease. Electronically Signed   By: Katherine Mantle M.D.   On: 05/07/2019 22:35    Procedures Procedures (including critical care time)  Medications Ordered in ED Medications  morphine 4 MG/ML injection 4 mg (4 mg Intravenous Given 05/07/19 2327)  ondansetron (ZOFRAN) injection 4 mg (4 mg Intravenous Given 05/07/19 2327)  hydrOXYzine (ATARAX/VISTARIL) tablet 25 mg (25 mg Oral Given 05/07/19 2332)     Initial Impression / Assessment and Plan / ED Course  I have reviewed the triage vital signs and the nursing notes.  Pertinent labs & imaging results that were available during my care of the patient were reviewed by me and considered in my medical decision making (see chart for details).  Clinical Course as of May 07 2255  Thu May 07, 2019  5913 33 year old male with known mitral stenosis anticipated for surgery soon here with increased shortness of breath dizziness lightheadedness like he might pass out with  chest pain.  His  vitals are all fairly unremarkable.  He is getting an EKG chest x-ray and some blood work and ultimately will touch base with cardiology regarding recommendations.   [MB]    Clinical Course User Index [MB] Terrilee Files, MD       BP (!) 141/54 (BP Location: Left Arm)   Pulse 86   Temp 98.5 F (36.9 C) (Oral)   Resp 14   SpO2 100%    Final Clinical Impressions(s) / ED Diagnoses   Final diagnoses:  Shortness of breath    ED Discharge Orders    None     10:17 PM Patient with history of mitral valve stenosis, pulmonary hypertension here with chest pain shortness of breath and dizziness.  Patient mention he is scheduled to have mitral valve replacement next week but is here due to worsening chest pain.  11:16 PM Appreciate consultation with cardiology, who have reviewed patient's EKGs and chest x-ray.  At this time, we felt that patient is stable for outpatient follow-up and subsequent surgical intervention for mitral valve repair.  He does not have any significant findings warranting admission at this time.  Suspect anxiety and panic attack probably contribute to his symptom.  Vistaril given.  Plan to increase alprazolam to 1 mg 3 times daily as needed for his anxiety in the meantime.  Darren Haynes was evaluated in Emergency Department on 05/07/2019 for the symptoms described in the history of present illness. He was evaluated in the context of the global COVID-19 pandemic, which necessitated consideration that the patient might be at risk for infection with the SARS-CoV-2 virus that causes COVID-19. Institutional protocols and algorithms that pertain to the evaluation of patients at risk for COVID-19 are in a state of rapid change based on information released by regulatory bodies including the CDC and federal and state organizations. These policies and algorithms were followed during the patient's care in the ED.    Fayrene Helper, PA-C 05/07/19 2343    Terrilee Files,  MD 05/08/19 [phone]

## 2019-05-11 ENCOUNTER — Encounter: Payer: Self-pay | Admitting: Thoracic Surgery (Cardiothoracic Vascular Surgery)

## 2019-05-11 ENCOUNTER — Other Ambulatory Visit (HOSPITAL_COMMUNITY)
Admission: RE | Admit: 2019-05-11 | Discharge: 2019-05-11 | Disposition: A | Discharge status: Home / Self Care | Payer: BC Managed Care – PPO | Source: Ambulatory Visit

## 2019-05-11 ENCOUNTER — Ambulatory Visit: Payer: BC Managed Care – PPO | Admitting: Thoracic Surgery (Cardiothoracic Vascular Surgery)

## 2019-05-11 ENCOUNTER — Other Ambulatory Visit (HOSPITAL_COMMUNITY): Payer: BC Managed Care – PPO

## 2019-05-11 ENCOUNTER — Encounter (HOSPITAL_COMMUNITY)
Admission: RE | Admit: 2019-05-11 | Discharge: 2019-05-11 | Disposition: A | Discharge status: Home / Self Care | Payer: BC Managed Care – PPO | Source: Ambulatory Visit | Attending: Thoracic Surgery (Cardiothoracic Vascular Surgery) | Admitting: Thoracic Surgery (Cardiothoracic Vascular Surgery)

## 2019-05-11 ENCOUNTER — Other Ambulatory Visit: Payer: Self-pay

## 2019-05-11 ENCOUNTER — Ambulatory Visit (HOSPITAL_COMMUNITY)
Admission: RE | Admit: 2019-05-11 | Discharge: 2019-05-11 | DRG: 951 | Disposition: A | Discharge status: Home / Self Care | Payer: BC Managed Care – PPO | Source: Ambulatory Visit | Attending: Thoracic Surgery (Cardiothoracic Vascular Surgery) | Admitting: Thoracic Surgery (Cardiothoracic Vascular Surgery)

## 2019-05-11 ENCOUNTER — Ambulatory Visit (INDEPENDENT_AMBULATORY_CARE_PROVIDER_SITE_OTHER): Payer: BC Managed Care – PPO | Admitting: Thoracic Surgery (Cardiothoracic Vascular Surgery)

## 2019-05-11 ENCOUNTER — Ambulatory Visit (HOSPITAL_COMMUNITY)
Admission: RE | Admit: 2019-05-11 | Discharge: 2019-05-11 | Disposition: A | Discharge status: Home / Self Care | Payer: BC Managed Care – PPO | Source: Ambulatory Visit | Attending: Thoracic Surgery (Cardiothoracic Vascular Surgery) | Admitting: Thoracic Surgery (Cardiothoracic Vascular Surgery)

## 2019-05-11 ENCOUNTER — Encounter (HOSPITAL_COMMUNITY): Payer: Self-pay

## 2019-05-11 VITALS — BP 129/80 | HR 74 | Temp 97.9°F | Resp 20 | Ht 74.0 in | Wt 210.0 lb

## 2019-05-11 DIAGNOSIS — Z01818 Encounter for other preprocedural examination: Secondary | ICD-10-CM | POA: Diagnosis present

## 2019-05-11 DIAGNOSIS — Z1159 Encounter for screening for other viral diseases: Secondary | ICD-10-CM | POA: Diagnosis not present

## 2019-05-11 DIAGNOSIS — I050 Rheumatic mitral stenosis: Secondary | ICD-10-CM

## 2019-05-11 DIAGNOSIS — Z9889 Other specified postprocedural states: Secondary | ICD-10-CM | POA: Diagnosis not present

## 2019-05-11 HISTORY — DX: Cardiac murmur, unspecified: R01.1

## 2019-05-11 HISTORY — DX: Unspecified osteoarthritis, unspecified site: M19.90

## 2019-05-11 HISTORY — DX: Idiopathic aseptic necrosis of unspecified bone: M87.00

## 2019-05-11 HISTORY — DX: Gastro-esophageal reflux disease without esophagitis: K21.9

## 2019-05-11 HISTORY — DX: Pneumonia, unspecified organism: J18.9

## 2019-05-11 HISTORY — DX: Depression, unspecified: F32.A

## 2019-05-11 HISTORY — DX: Rheumatic fever without heart involvement: I00

## 2019-05-11 HISTORY — DX: Dyspnea, unspecified: R06.00

## 2019-05-11 HISTORY — DX: Heart failure, unspecified: I50.9

## 2019-05-11 LAB — URINALYSIS, ROUTINE W REFLEX MICROSCOPIC
Glucose, UA: NEGATIVE mg/dL
Hgb urine dipstick: NEGATIVE
Ketones, ur: NEGATIVE mg/dL
Leukocytes,Ua: NEGATIVE
Nitrite: NEGATIVE
Protein, ur: NEGATIVE mg/dL
Specific Gravity, Urine: 1.02 (ref 1.005–1.030)
pH: 5 (ref 5.0–8.0)

## 2019-05-11 LAB — CBC
HCT: 39.8 % (ref 39.0–52.0)
Hemoglobin: 13.8 g/dL (ref 13.0–17.0)
MCH: 32.2 pg (ref 26.0–34.0)
MCHC: 34.7 g/dL (ref 30.0–36.0)
MCV: 92.8 fL (ref 80.0–100.0)
Platelets: 212 10*3/uL (ref 150–400)
RBC: 4.29 MIL/uL (ref 4.22–5.81)
RDW: 12.9 % (ref 11.5–15.5)
WBC: 4.9 10*3/uL (ref 4.0–10.5)
nRBC: 0 % (ref 0.0–0.2)

## 2019-05-11 LAB — TYPE AND SCREEN
ABO/RH(D): A POS
Antibody Screen: NEGATIVE

## 2019-05-11 LAB — COMPREHENSIVE METABOLIC PANEL
ALT: 26 U/L (ref 0–44)
AST: 22 U/L (ref 15–41)
Albumin: 3.9 g/dL (ref 3.5–5.0)
Alkaline Phosphatase: 48 U/L (ref 38–126)
Anion gap: 10 (ref 5–15)
BUN: 15 mg/dL (ref 6–20)
CO2: 23 mmol/L (ref 22–32)
Calcium: 9.4 mg/dL (ref 8.9–10.3)
Chloride: 105 mmol/L (ref 98–111)
Creatinine, Ser: 1.16 mg/dL (ref 0.61–1.24)
GFR calc Af Amer: >60 mL/min (ref >60)
GFR calc non Af Amer: >60 mL/min (ref >60)
Glucose, Bld: 100 mg/dL — ABNORMAL HIGH (ref 70–99)
Potassium: 3.8 mmol/L (ref 3.5–5.1)
Sodium: 138 mmol/L (ref 135–145)
Total Bilirubin: 0.8 mg/dL (ref 0.3–1.2)
Total Protein: 6.7 g/dL (ref 6.5–8.1)

## 2019-05-11 LAB — ABO/RH: ABO/RH(D): A POS

## 2019-05-11 LAB — BLOOD GAS, ARTERIAL
Acid-Base Excess: 0.8 mmol/L (ref 0.0–2.0)
Bicarbonate: 25.1 mmol/L (ref 20.0–28.0)
Drawn by: 421801
FIO2: 21
O2 Saturation: 98.3 %
Patient temperature: 98.6
pCO2 arterial: 41.5 mmHg (ref 32.0–48.0)
pH, Arterial: 7.399 (ref 7.350–7.450)
pO2, Arterial: 104 mmHg (ref 83.0–108.0)

## 2019-05-11 LAB — SURGICAL PCR SCREEN
MRSA, PCR: NEGATIVE
Staphylococcus aureus: NEGATIVE

## 2019-05-11 LAB — HEMOGLOBIN A1C
Hgb A1c MFr Bld: 4 % — ABNORMAL LOW (ref 4.8–5.6)
Mean Plasma Glucose: 68.1 mg/dL

## 2019-05-11 LAB — APTT: aPTT: 30 seconds (ref 24–36)

## 2019-05-11 LAB — PROTIME-INR
INR: 1 (ref 0.8–1.2)
Prothrombin Time: 13.4 seconds (ref 11.4–15.2)

## 2019-05-11 LAB — SARS CORONAVIRUS 2 (TAT 6-24 HRS): SARS Coronavirus 2: NEGATIVE

## 2019-05-11 NOTE — Progress Notes (Signed)
[facility] [phone] - [place], [place] - [address] AT [facility] OF [address] & [address] [place] [address] Phone: [phone] Fax: [phone]  [id number] [place], [place] - [address] AT [facility] OF [address] & [address] [place] [address] Phone: [phone] Fax: [phone]      Your procedure is scheduled on July 2  Report to [facility] Main Entrance "A" at 0530 A.M., and check in at the Admitting office.  Call this number if you have problems the morning of surgery:  [phone]  Call [phone] if you have any questions prior to your surgery date Monday-Friday 8am-4pm    Remember:  Do not eat or drink after midnight.    Take these medicines the morning of surgery with A SIP OF WATER  ALPRAZolam (NIRAVAM busPIRone (BUSPAR) famotidine (PEPCID) fluticasone-salmeterol (ADVAIR HFA) if needed, Please bring all inhalers with you the day of surgery.  gabapentin (NEURONTIN) metoprolol succinate (TOPROL-XL) oxyCODONE-acetaminophen (PERCOCET)  If needed penicillin v potassium (VEETID) tiZANidine (ZANAFLEX)  Continue Aspirin but do not take the morning of surgery  7 days prior to surgery STOP taking any  Aleve, Naproxen, Ibuprofen, Motrin, Advil, Goody's, BC's, all herbal medications, fish oil, and all vitamins.    The Morning of Surgery  Do not wear jewelry  Do not wear lotions, powders, or perfumes/colognes, or deodorant  Do not shave 48 hours prior to surgery.  Men may shave face and neck.  Do not bring valuables to the hospital.  [facility] is not responsible for any belongings or valuables.  If you are a smoker, DO NOT Smoke 24 hours prior to surgery IF you wear a CPAP at night please bring your mask, tubing, and machine the morning of surgery   Remember that you must have someone to transport you home after your surgery, and remain with you for 24 hours if you are discharged  the same day.   Contacts, glasses, hearing aids, dentures or bridgework may not be worn into surgery.    Leave your suitcase in the car.  After surgery it may be brought to your room.  For patients admitted to the hospital, discharge time will be determined by your treatment team.  Patients discharged the day of surgery will not be allowed to drive home.    Special instructions:   [facility]- Preparing For Surgery  Before surgery, you can play an important role. Because skin is not sterile, your skin needs to be as free of germs as possible. You can reduce the number of germs on your skin by washing with CHG (chlorahexidine gluconate) Soap before surgery.  CHG is an antiseptic cleaner which kills germs and bonds with the skin to continue killing germs even after washing.    Oral Hygiene is also important to reduce your risk of infection.  Remember - BRUSH YOUR TEETH THE MORNING OF SURGERY WITH YOUR REGULAR TOOTHPASTE  Please do not use if you have an allergy to CHG or antibacterial soaps. If your skin becomes reddened/irritated stop using the CHG.  Do not shave (including legs and underarms) for at least 48 hours prior to first CHG shower. It is OK to shave your face.  Please follow these instructions carefully.   1. Shower the NIGHT BEFORE SURGERY and the MORNING OF SURGERY with CHG Soap.   2. If you chose to wash your hair, wash your hair first as usual with your normal  shampoo.  3. After you shampoo, rinse your hair and body thoroughly to remove the shampoo.  4. Use CHG as you would any other liquid soap. You can apply CHG directly to the skin and wash gently with a scrungie or a clean washcloth.   5. Apply the CHG Soap to your body ONLY FROM THE NECK DOWN.  Do not use on open wounds or open sores. Avoid contact with your eyes, ears, mouth and genitals (private parts). Wash Face and genitals (private parts)  with your normal soap.   6. Wash thoroughly, paying special attention  to the area where your surgery will be performed.  7. Thoroughly rinse your body with warm water from the neck down.  8. DO NOT shower/wash with your normal soap after using and rinsing off the CHG Soap.  9. Pat yourself dry with a CLEAN TOWEL.  10. Wear CLEAN PAJAMAS to bed the night before surgery, wear comfortable clothes the morning of surgery  11. Place CLEAN SHEETS on your bed the night of your first shower and DO NOT SLEEP WITH PETS.    Day of Surgery:  Do not apply any deodorants/lotions.  Please wear clean clothes to the hospital/surgery center.   Remember to brush your teeth WITH YOUR REGULAR TOOTHPASTE.   Please read over the following fact sheets that you were given.

## 2019-05-11 NOTE — Progress Notes (Signed)
[address]       [place] [address]             [phone]     CARDIOTHORACIC SURGERY OFFICE NOTE  Referring Provider is Lorretta Harp, MD PCP is [facility]   HPI:  Patient is a 33 year old male with rheumatic mitral stenosis, chronic diastolic congestive heart failure, pulmonary hypertension, rheumatoid arthritis, and possible multiple sclerosis who returns the office today with tentative plans to proceed with mitral valve replacement later this week.  Patient was originally seen in consultation on April 20, 2019.  On April 22, 2019 he was evaluated by Dr. Nigel Bridgeman in the neurology clinic at [facility] who felt that previous neuro imaging studies were not consistent with multiple sclerosis.  He underwent lumbar puncture to evaluate CSF and follow-up work-up remains in progress.  He returns to the office today for follow-up prior to surgery.  He reports no new problems or complaints.  He specifically denies any recent fever, productive cough, or other symptoms to suggest the development of COVID-19 infection.  He has been practicing social distancing and he has not been exposed to any persons with known or suspected COVID-19 infection.  He has not been taking methotrexate for the past 2 weeks.  The remainder of his review of systems is unchanged from previously.   Current Outpatient Medications  Medication Sig Dispense Refill  . ALPRAZolam (NIRAVAM) 1 MG dissolvable tablet Take 0.5 mg by mouth 3 (three) times daily as needed for anxiety.     [name] Kitchen aspirin EC 81 MG tablet Take 81 mg by mouth daily.    . Buprenorphine HCl (BELBUCA) 750 MCG FILM Place 750 mcg inside cheek 2 (two) times daily.     . busPIRone (BUSPAR) 7.5 MG tablet Take 7.5 mg by mouth 3 (three) times daily.    . clonazePAM (KLONOPIN) 0.5 MG tablet Take 0.5 mg by mouth at bedtime.    [name] Kitchen etodolac (LODINE XL) 400 MG 24 hr tablet Take 400 mg by mouth every evening.     .  famotidine (PEPCID) 20 MG tablet Take 20 mg by mouth 2 (two) times daily as needed for heartburn or indigestion.     . fluticasone-salmeterol (ADVAIR HFA) 45-21 MCG/ACT inhaler Inhale 2 puffs into the lungs 2 (two) times daily.    . folic acid (FOLVITE) 1 MG tablet Take 1 mg by mouth daily.    [name] Kitchen gabapentin (NEURONTIN) 300 MG capsule Take 600 mg by mouth 3 (three) times daily.     [name] Kitchen MELATONIN PO Take 1 tablet by mouth at bedtime as needed (sleep).    . methotrexate 2.5 MG tablet Take 20 mg by mouth once a week.     . metoprolol succinate (TOPROL-XL) 50 MG 24 hr tablet Take 1 tablet (50 mg total) by mouth daily. 90 tablet 3  . nortriptyline (PAMELOR) 25 MG capsule Take 25 mg by mouth at bedtime.     [name] Kitchen oxyCODONE-acetaminophen (PERCOCET) 7.5-325 MG tablet Take 1 tablet by mouth every 4 (four) hours as needed for severe pain.    [name] Kitchen penicillin v potassium (VEETID) 250 MG tablet Take 250 mg by mouth 2 (two) times a day.    . potassium chloride (K-DUR,KLOR-CON) 20 MEQ tablet Take 1 tablet (20 mEq total) by mouth daily. 30 tablet 3  . sertraline (ZOLOFT) 100 MG tablet Take 100 mg by mouth at bedtime.     . tamsulosin (FLOMAX) 0.4 MG  CAPS capsule Take 0.4 mg by mouth every evening.    [name] Kitchen tiZANidine (ZANAFLEX) 4 MG tablet Take 4 mg by mouth 3 (three) times daily.    . Vitamin D, Ergocalciferol, (DRISDOL) 1.25 MG (50000 UT) CAPS capsule Take 50,000 Units by mouth every Tuesday.     . furosemide (LASIX) 40 MG tablet Take 1 tablet (40 mg total) by mouth 2 (two) times daily. DO NOT TAKE ON THE DAY OF YOUR PROCEDURE ON 12/11/2018 (Patient taking differently: Take 40 mg by mouth 2 (two) times daily. ) 60 tablet 3   No current facility-administered medications for this visit.       Physical Exam:   BP 129/80   Pulse 74   Temp 97.9 F (36.6 C) (Skin)   Resp 20   Ht 6\' 2"  (1.88 m)   Wt 210 lb (95.3 kg)   SpO2 98% Comment: RA  BMI 26.96 kg/m   General:  Well-appearing  Chest:   Clear to auscultation  CV:    Regular rate and rhythm  Incisions:  n/a  Abdomen:  Soft nontender  Extremities:  Warm and well-perfused  Diagnostic Tests:  CT ANGIOGRAPHY CHEST, ABDOMEN AND PELVIS  TECHNIQUE: Multidetector CT imaging through the chest, abdomen and pelvis was performed using the standard protocol during bolus administration of intravenous contrast. Multiplanar reconstructed images and MIPs were obtained and reviewed to evaluate the vascular anatomy.  CONTRAST:  31mL ISOVUE-370 IOPAMIDOL (ISOVUE-370) INJECTION 76%  COMPARISON:  CT of the chest without contrast on 06/11/2018. Chest x-ray on 04/21/2019  FINDINGS: CTA CHEST FINDINGS  Cardiovascular: The thoracic aorta is normal in caliber without evidence of aneurysm or stenosis. The aortic arch is normally positioned. Proximal great vessels demonstrate normal patency and normal branching anatomy. The heart appears normal in size. No pericardial fluid identified. No calcified coronary artery plaque is identified. Central pulmonary arteries are normal in caliber. No venous anomalies identified in the chest. Pulmonary venous anatomy is normal. Normally patent left atrial appendage.  Mediastinum/Nodes: No enlarged mediastinal, hilar, or axillary lymph nodes. Thyroid gland, trachea, and esophagus demonstrate no significant findings.  Lungs/Pleura: There is mild scattered patchy ground-glass opacity in the anterior left upper lobe and lingula not seen previously by CT. This is occult by recent x-ray. Correlation suggested with any respiratory or infectious symptoms. No overt edema, pleural fluid, nodules, significant chronic lung disease or pneumothorax identified.  Musculoskeletal: No chest wall abnormality. No acute or significant osseous findings.  Review of the MIP images confirms the above findings.  CTA ABDOMEN AND PELVIS FINDINGS  VASCULAR  Aorta: The abdominal aorta is normally patent and of normal caliber without  evidence of aneurysm, stenosis or atherosclerosis.  Celiac: Focal narrowing at the origin of the celiac axis does not appear atherosclerotic and likely is due to arcuate ligament compression. The celiac axis and distal branches are patent.  SMA: Normally patent.  Renals: There are 2 separate right renal arteries originating off of the abdominal aorta and 3 separate left renal arteries. All vessels are normally patent.  IMA: Normally patent.  Inflow: Bilateral common, external and internal iliac arteries demonstrate normal patency without evidence of aneurysm, stenosis or atherosclerosis. Common femoral arteries and femoral bifurcations are normally patent.  Veins: On arterial phase of imaging, no venous anomalies or abnormalities are identified.  Review of the MIP images confirms the above findings.  NON-VASCULAR  Hepatobiliary: The liver demonstrates diffuse steatosis without evidence of overt cirrhosis or focal lesions. No biliary dilatation. The gallbladder is  contracted and unremarkable in appearance.  Pancreas: Unremarkable. No pancreatic ductal dilatation or surrounding inflammatory changes.  Spleen: Normal in size without focal abnormality.  Adrenals/Urinary Tract: Adrenal glands are unremarkable. Kidneys are normal, without renal calculi, focal lesion, or hydronephrosis. Bladder is unremarkable.  Stomach/Bowel: Bowel shows no evidence of obstruction, ileus, inflammation or lesion. No free air identified. The appendix is normal.  Lymphatic: No enlarged lymph nodes identified in the abdomen or pelvis.  Reproductive: Prostate is unremarkable.  Other: No hernias or abnormal fluid collections identified.  Musculoskeletal: No acute or significant osseous findings.  Review of the MIP images confirms the above findings.  IMPRESSION: 1. Normal thoracic aorta and visualized proximal great vessels. 2. Normal abdominal aorta, iliac arteries and  common femoral arteries. 3. 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 4. Incidental multiple renal arteries with 2 separate renal arteries on the right and 3 separate renal arteries on the left. All are widely patent. 5. Diffuse hepatic steatosis without evidence of overt cirrhosis.   Electronically Signed   By: Irish LackGlenn  Yamagata M.D.   On: 04/24/2019 09:30   Impression:  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  He presents with rapidly recurrent symptoms of exertional shortness of breath and chest discomfort consistent with chronic diastolic congestive heart failure, New York Heart Association functional class IIIb.  He has recently been having symptoms with minimal activity and occasionally at rest.  He cannot lay flat in bed.  I personally reviewed the patient's multiple previous echocardiograms and diagnostic cardiac catheterization.  I agree that he would probably best be treated with mitral valve replacement.  Risks associated with surgery will be adversely affected by the patient's underlying neurologic condition and need for chronic immunosuppression.  CT angiography reveals no contraindication to peripheral cannulation for surgery.  Plan:  The patient was was counseled at length regarding the indications, risks and potential benefits of mitral valve replacement.  The rationale for elective surgery has been explained, including a comparison between surgery, another attempt at balloon mitral valvuloplasty, and continued medical therapy with close follow-up.  Alternative surgical approaches have been discussed including a comparison between conventional sternotomy and minimally-invasive techniques.  The relative risks and benefits of each have been reviewed as they pertain to the patient's specific circumstances, and  expectations for the patient's postoperative convalescence has been discussed.  The potential adverse impact of the patient's coexisting progressive neurologic disease and need for chronic immunosuppression have been discussed.  The patient desires to proceed with surgery as soon as practical.   We discussed the possibility of replacing the mitral valve using a mechanical prosthesis with the attendant need for long-term anticoagulation versus the alternative of replacing it using a bioprosthetic tissue valve with its potential for late structural valve deterioration and failure, depending upon the patient's longevity.  The patient specifically requests that if the mitral valve must be replaced that it be done using a mechanical valve.   The patient understands and accepts all potential risks of surgery including but not limited to risk of death, stroke or other neurologic complication, myocardial infarction, congestive heart failure, respiratory failure, renal failure, bleeding requiring transfusion and/or reexploration, arrhythmia, infection or other wound complications, pneumonia, pleural and/or pericardial effusion, pulmonary embolus, aortic dissection or other major vascular complication, or delayed complications related to valve repair or replacement including but not limited to structural  valve deterioration and failure, thrombosis, embolization, endocarditis, or paravalvular leak.  Specific risks potentially related to the minimally-invasive approach were discussed at length, including but not limited to risk of conversion to full or partial sternotomy, aortic dissection or other major vascular complication, unilateral acute lung injury or pulmonary edema, phrenic nerve dysfunction or paralysis, rib fracture, chronic pain, lung hernia, or lymphocele.  Expectations for the patient's postoperative convalescence have been discussed.  All of his questions have been answered.    I spent in excess of 15  minutes during the conduct of this office consultation and >50% of this time involved direct face-to-face encounter with the patient for counseling and/or coordination of their care.    Salvatore Decentlarence H. Cornelius Moraswen, MD 05/11/2019 12:02 PM

## 2019-05-11 NOTE — Progress Notes (Signed)
VASCULAR LAB PRELIMINARY  PRELIMINARY  PRELIMINARY  PRELIMINARY  Pre CABG Dopplers completed.    Preliminary report:  See CV proc for preliminary results.   Lidia Clavijo, RVT 05/11/2019, 12:06 PM

## 2019-05-11 NOTE — Progress Notes (Signed)
PCP - [place]  Cardiologist - [place]  Chest x-ray - 05/11/19 EKG - 05/07/19 Stress Test - 11/2018 ECHO - 03/27/19 Cardiac Cath - 12/24/18 CE  Sleep Study - 5 years negative study   Anesthesia review: yes ASA continue do not take day of surgery  Patient denies shortness of breath, fever, cough and chest pain at PAT appointment   Patient verbalized understanding of instructions that were given to them at the PAT appointment. Patient was also instructed that they will need to review over the PAT instructions again at home before surgery.

## 2019-05-12 ENCOUNTER — Other Ambulatory Visit: Payer: Self-pay | Admitting: Cardiovascular Disease

## 2019-05-12 NOTE — Telephone Encounter (Signed)
Patient has been seen.

## 2019-05-12 NOTE — Telephone Encounter (Signed)
Open n error °

## 2019-05-13 MED ORDER — MANNITOL 20 % IV SOLN
INTRAVENOUS | Status: DC
  Filled 2019-05-13: qty 13

## 2019-05-13 MED ORDER — GLUTARALDEHYDE 0.625% SOAKING SOLUTION
TOPICAL | Status: DC
  Filled 2019-05-13: qty 50

## 2019-05-13 MED ORDER — PLASMA-LYTE 148 IV SOLN
INTRAVENOUS | Status: DC
  Filled 2019-05-13: qty 2.5

## 2019-05-13 MED ORDER — VANCOMYCIN HCL 10 G IV SOLR
1500.0000 mg | INTRAVENOUS | Status: AC
  Administered 2019-05-14: 1500 mg via INTRAVENOUS
  Filled 2019-05-13: qty 1500

## 2019-05-13 MED ORDER — PHENYLEPHRINE HCL-NACL 20-0.9 MG/250ML-% IV SOLN
30.0000 ug/min | INTRAVENOUS | Status: AC
  Administered 2019-05-14: 15 ug/min via INTRAVENOUS
  Filled 2019-05-13: qty 250

## 2019-05-13 MED ORDER — POTASSIUM CHLORIDE 2 MEQ/ML IV SOLN
80.0000 meq | INTRAVENOUS | Status: DC
  Filled 2019-05-13: qty 40

## 2019-05-13 MED ORDER — EPINEPHRINE PF 1 MG/ML IJ SOLN
0.0000 ug/min | INTRAVENOUS | Status: DC
  Filled 2019-05-13: qty 4

## 2019-05-13 MED ORDER — TRANEXAMIC ACID 1000 MG/10ML IV SOLN
1.5000 mg/kg/h | INTRAVENOUS | Status: AC
  Administered 2019-05-14: 1.5 mg/kg/h via INTRAVENOUS
  Filled 2019-05-13: qty 25

## 2019-05-13 MED ORDER — INSULIN REGULAR(HUMAN) IN NACL 100-0.9 UT/100ML-% IV SOLN
INTRAVENOUS | Status: AC
  Administered 2019-05-14: 09:00:00 1 [IU]/h via INTRAVENOUS
  Filled 2019-05-13: qty 100

## 2019-05-13 MED ORDER — TRANEXAMIC ACID (OHS) BOLUS VIA INFUSION
15.0000 mg/kg | INTRAVENOUS | Status: AC
  Administered 2019-05-14: 1429.5 mg via INTRAVENOUS
  Filled 2019-05-13: qty 1430

## 2019-05-13 MED ORDER — MILRINONE LACTATE IN DEXTROSE 20-5 MG/100ML-% IV SOLN
0.3000 ug/kg/min | INTRAVENOUS | Status: DC
  Filled 2019-05-13: qty 100

## 2019-05-13 MED ORDER — SODIUM CHLORIDE 0.9 % IV SOLN
INTRAVENOUS | Status: DC
  Filled 2019-05-13: qty 30

## 2019-05-13 MED ORDER — NITROGLYCERIN IN D5W 200-5 MCG/ML-% IV SOLN
2.0000 ug/min | INTRAVENOUS | Status: DC
  Filled 2019-05-13: qty 250

## 2019-05-13 MED ORDER — SODIUM CHLORIDE 0.9 % IV SOLN
750.0000 mg | INTRAVENOUS | Status: AC
  Administered 2019-05-14: 1.5 g via INTRAVENOUS
  Administered 2019-05-14: .75 g via INTRAVENOUS
  Filled 2019-05-13: qty 750

## 2019-05-13 MED ORDER — SODIUM CHLORIDE 0.9 % IV SOLN
1.5000 g | INTRAVENOUS | Status: DC
  Filled 2019-05-13 (×2): qty 1.5

## 2019-05-13 MED ORDER — TRANEXAMIC ACID (OHS) PUMP PRIME SOLUTION
2.0000 mg/kg | INTRAVENOUS | Status: DC
  Filled 2019-05-13: qty 1.91

## 2019-05-13 MED ORDER — VANCOMYCIN HCL 1000 MG IV SOLR
INTRAVENOUS | Status: DC
  Filled 2019-05-13: qty 1000

## 2019-05-13 MED ORDER — DEXMEDETOMIDINE HCL IN NACL 400 MCG/100ML IV SOLN
0.1000 ug/kg/h | INTRAVENOUS | Status: AC
  Administered 2019-05-14: .7 ug/kg/h via INTRAVENOUS
  Filled 2019-05-13: qty 100

## 2019-05-13 MED ORDER — DOPAMINE-DEXTROSE 3.2-5 MG/ML-% IV SOLN
0.0000 ug/kg/min | INTRAVENOUS | Status: DC
  Filled 2019-05-13: qty 250

## 2019-05-13 NOTE — H&P (Signed)
[address]       [place] Hay[place] [address]             [phone]          CARDIOTHORACIC SURGERY HISTORY AND PHYSICAL EXAM  Referring Provider is Runell Gess, MD PCP is [facility] Complaint  Patient presents with  . Mitral Stenosis    S/P MITRAL BALLOON VALVULOPLASTY 2/20 @ [facility]...ECHO 5/1/TEE 5/15/CATH 12/11/18    Patient is a 33 year old male with rheumatic mitral stenosis, chronic diastolic congestive heart failure, pulmonary hypertension, and multiple sclerosis who has been referred for surgical consultation to discuss this treatment options for management of severe mitral stenosis that has failed an attempt at balloon mitral valvuloplasty.  Patient denies any known history of rheumatic fever during childhood although he does report that he apparently had several cases of Streptococcus.  He reports a progressive history of symptoms of shortness of breath for more than a year.  He was evaluated by multiple physicians and told there was nothing wrong with his heart.  Eventually he developed more severe shortness of breath and a chest x-ray revealed mild pulmonary edema.  He was evaluated by a physician's assistant at [facility] and referred to Dr. Hanley Hays.  Transthoracic echocardiogram revealed normal left ventricular systolic function with severe mitral stenosis and signs of pulmonary hypertension.  The patient was subsequently referred to Dr. Allyson Sabal who initially evaluated the patient in January of this year.  TEE performed December 11, 2018 revealed findings consistent with rheumatic mitral stenosis.  Mean transvalvular gradient was estimated 19 mmHg.  Valve area by pressure half-time was 0.71 cm.  Using planimetry the valve area was 0.96 cm.  By the continuity equation it measured 0.61 cm and using PISA it measured only 0.55 cm.  There was only mild mitral regurgitation in the valvuloplasty score was 5.  Left and right heart  catheterization was performed by Dr. Allyson Sabal.  There was normal coronary artery anatomy with no significant coronary artery disease.  Mean transvalvular gradient across mitral valve was estimated 16.1 mmHg.  There was moderate pulmonary hypertension with normal central venous pressure and cardiac output.  The indexed mitral valve area was 0.56 cm/m.    The patient was referred to Dr. Regino Schultze at [facility] and underwent balloon mitral valvuloplasty on December 24, 2018.  Although the procedure was technically successful, the patient was told by Dr. Regino Schultze that the improvement was less than optimal and long-term prognosis questionable.  The patient enjoyed immediate improvement in his respiratory status, but within several weeks he began to develop worsening shortness of breath.  Symptoms have continued to progress over the past 3 months, and the patient states that symptoms are almost as bad as they were prior to his valvuloplasty in February.  He now gets short of breath with low-level activity and occasionally at rest.  He has tightness across his chest that comes and goes but is essentially present most of the time.  He cannot do much of any physical activity without having to stop to catch his breath.  He cannot lay flat in bed.  He has palpitations.  He has had some mild lower extremity edema.  He reports occasional dizzy spells without syncope.  He has had increased symptoms of palpitations and previous long-term event monitor reveals sinus rhythm with sinus bradycardia, sinus tachycardia, PVCs, PACs, and occasional runs of supraventricular tachycardia and nonsustained ventricular tachycardia.  He  has not had any documented atrial fibrillation nor any other sustained arrhythmias.  The patient recently underwent follow-up transthoracic and transesophageal echocardiograms which revealed some decline in left ventricular systolic function with ejection fraction estimated only 45 to 50%.  There was  at least moderate and probably severe persistent mitral stenosis.  There was mild mitral regurgitation.  Cardiothoracic surgical consultation was requested.  Patient is married and lives with his wife in [place].  He previously worked in [employer] but he has no disabled because of recently diagnosed multiple sclerosis.  He suffers from chronic pain and numbness in both lower legs as well as occasional jerking movements consistent with mild clonus.  He has been followed at [facility] and recently referred to a multiple sclerosis specialist to consider medical therapy.  He previously had been treated with steroids for joint pain attributed to rheumatoid arthritis, although he has been advised to stay off of steroids for the last few months and is now taking methotrexate.  His mobility is moderately limited to the ambulates using a cane.  He has had a few mechanical falls without significant injury.  Patient is a 33 year old male with rheumatic mitral stenosis, chronic diastolic congestive heart failure, pulmonary hypertension, rheumatoid arthritis, and possible multiple sclerosis who returns the office today with tentative plans to proceed with mitral valve replacement later this week.  Patient was originally seen in consultation on April 20, 2019.  On April 22, 2019 he was evaluated by Dr. Ronalee Red in the neurology clinic at [facility] who felt that previous neuro imaging studies were not consistent with multiple sclerosis.  He underwent lumbar puncture to evaluate CSF and follow-up work-up remains in progress.  He returns to the office today for follow-up prior to surgery.  He reports no new problems or complaints.  He specifically denies any recent fever, productive cough, or other symptoms to suggest the development of COVID-19 infection.  He has been practicing social distancing and he has not been exposed to any persons with known or suspected COVID-19  infection.  He has not been taking methotrexate for the past 2 weeks.  The remainder of his review of systems is unchanged from previously.   Past Medical History:  Diagnosis Date  . Arthritis   . Avascular necrosis (HCC)   . CHF (congestive heart failure) (HCC)   . Chronic back pain   . Depression   . Dyspnea   . GERD (gastroesophageal reflux disease)   . Heart murmur   . Mitral valve stenosis   . Panic attacks   . Pneumonia    hx 4 time  . Pulmonary hypertension (HCC)   . Rheumatic fever     Past Surgical History:  Procedure Laterality Date  . BALLOON VALVULOPLASTY  12/24/2018   balloon mitral valvuloplasty at [facility]  . RIGHT/LEFT HEART CATH AND CORONARY ANGIOGRAPHY N/A 12/11/2018   Procedure: RIGHT/LEFT HEART CATH AND CORONARY ANGIOGRAPHY;  Surgeon: Runell Gess, MD;  Location: [facility] INVASIVE CV LAB;  Service: Cardiovascular;  Laterality: N/A;  . TEE WITHOUT CARDIOVERSION N/A 12/11/2018   Procedure: TRANSESOPHAGEAL ECHOCARDIOGRAM (TEE);  Surgeon: Thurmon Fair, MD;  Location: [facility] ENDOSCOPY;  Service: Cardiovascular;  Laterality: N/A;  . TEE WITHOUT CARDIOVERSION N/A 03/27/2019   Procedure: TRANSESOPHAGEAL ECHOCARDIOGRAM (TEE);  Surgeon: Elease Hashimoto Deloris Ping, MD;  Location: [facility] ENDOSCOPY;  Service: Cardiovascular;  Laterality: N/A;  . TYMPANOSTOMY TUBE PLACEMENT      Family History  Problem Relation Age of Onset  .  Heart disease Mother   . Depression Mother   . Hypertension Father   . Hyperlipidemia Father     Social History Social History   Tobacco Use  . Smoking status: Never Smoker  . Smokeless tobacco: Current User    Types: Snuff  Substance Use Topics  . Alcohol use: No  . Drug use: No    Prior to Admission medications   Medication Sig Start Date End Date Taking? Authorizing Provider  ALPRAZolam (NIRAVAM) 1 MG dissolvable tablet Take 0.5 mg by mouth 3 (three) times daily as needed for anxiety.    Yes [provider]  aspirin EC 81 MG tablet Take 81 mg  by mouth daily.   Yes [provider]  Buprenorphine HCl (BELBUCA) 750 MCG FILM Place 750 mcg inside cheek 2 (two) times daily.    Yes [provider]  busPIRone (BUSPAR) 7.5 MG tablet Take 7.5 mg by mouth 3 (three) times daily.   Yes [provider]  clonazePAM (KLONOPIN) 0.5 MG tablet Take 0.5 mg by mouth at bedtime.   Yes [provider]  etodolac (LODINE XL) 400 MG 24 hr tablet Take 400 mg by mouth every evening.    Yes [provider]  famotidine (PEPCID) 20 MG tablet Take 20 mg by mouth 2 (two) times daily as needed for heartburn or indigestion.    Yes [provider]  fluticasone-salmeterol (ADVAIR HFA) 45-21 MCG/ACT inhaler Inhale 2 puffs into the lungs 2 (two) times daily.   Yes [provider]  folic acid (FOLVITE) 1 MG tablet Take 1 mg by mouth daily.   Yes [provider]  gabapentin (NEURONTIN) 300 MG capsule Take 600 mg by mouth 3 (three) times daily.    Yes [provider]  MELATONIN PO Take 1 tablet by mouth at bedtime as needed (sleep).   Yes [provider]  methotrexate 2.5 MG tablet Take 20 mg by mouth once a week.    Yes [provider]  metoprolol succinate (TOPROL-XL) 50 MG 24 hr tablet Take 1 tablet (50 mg total) by mouth daily. 04/14/19  Yes Lorretta Harp, MD  nortriptyline (PAMELOR) 25 MG capsule Take 25 mg by mouth at bedtime.  10/24/18  Yes [provider]  oxyCODONE-acetaminophen (PERCOCET) 7.5-325 MG tablet Take 1 tablet by mouth every 4 (four) hours as needed for severe pain.   Yes [provider]  penicillin v potassium (VEETID) 250 MG tablet Take 250 mg by mouth 2 (two) times a day.   Yes [provider]  sertraline (ZOLOFT) 100 MG tablet Take 100 mg by mouth at bedtime.    Yes [provider]  tamsulosin (FLOMAX) 0.4 MG CAPS capsule Take 0.4 mg by mouth every evening.   Yes [provider]  tiZANidine (ZANAFLEX) 4 MG  tablet Take 4 mg by mouth 3 (three) times daily. 04/22/19  Yes [provider]  Vitamin D, Ergocalciferol, (DRISDOL) 1.25 MG (50000 UT) CAPS capsule Take 50,000 Units by mouth every Tuesday.    Yes [provider]  furosemide (LASIX) 40 MG tablet TAKE 1 TABLET(40 MG) BY MOUTH TWICE DAILY. DO NOT TAKE ON THE DAY OF YOUR PROCEDURE ON 12/11/2018 05/12/19   Lorretta Harp, MD  potassium chloride SA (K-DUR) 20 MEQ tablet TAKE 1 TABLET(20 MEQ) BY MOUTH DAILY 05/12/19   Lorretta Harp, MD    No Known Allergies  Review of Systems:  General: decreased appetite, decreased energy, + weight gain, no weight loss,  no fever  Cardiac: + chest pain with exertion, + chest pain at rest, +SOB with low level exertion, + occasional resting SOB, + PND, + orthopnea, + palpitations, no arrhythmia, no atrial fibrillation, + LE edema, + dizzy spells, no syncope  Respiratory: + shortness of breath, no home oxygen, no productive cough, no dry cough, no bronchitis, + wheezing, no hemoptysis, + asthma, no pain with inspiration or cough, no sleep apnea, no CPAP at night  GI: no difficulty swallowing, no reflux, + frequent heartburn, no hiatal hernia, + mild abdominal pain, + constipation, no diarrhea, no hematochezia, no hematemesis, no melena  GU: no dysuria, no frequency, no urinary tract infection, no hematuria, no enlarged prostate, no kidney stones, no kidney disease  Vascular: no pain suggestive of claudication, + chronic pain in legs and feet, no leg cramps, no varicose veins, no DVT, no non-healing foot ulcer  Neuro: no stroke, no TIA's, no seizures, no headaches, no temporary blindness one eye, no slurred speech, + peripheral neuropathy, + chronic pain, + instability of gait, + memory/cognitive dysfunction  Musculoskeletal: + arthritis, + joint swelling, + myalgias, + difficulty walking, limited mobility  Skin: no rash, no itching, no skin infections, no pressure sores or ulcerations  Psych: +  anxiety, + depression, + nervousness, + unusual recent stress  Eyes: + blurry vision, no floaters, + recent vision changes, + wears glasses or contacts  ENT: + hearing loss, no loose or painful teeth, no dentures, last saw dentist July 2019  Hematologic: no easy bruising, no abnormal bleeding, no clotting disorder, no frequent epistaxis  Endocrine: no diabetes, does not check CBG's at home  Physical Exam:  BP 118/77 (BP Location: Right Arm, Patient Position: Sitting, Cuff Size: Normal)  Pulse 80  Temp 97.7 F (36.5 C) (Skin)  Resp 16  Ht 6\' 2"  (1.88 m)  Wt 210 lb (95.3 kg)  SpO2 98% Comment: ON RA  BMI 26.96 kg/m  General: Thin, well-appearing  HEENT: Unremarkable  Neck: no JVD, no bruits, no adenopathy  Chest: clear to auscultation, symmetrical breath sounds, no wheezes, no rhonchi  CV: RRR, + opening systolic click, no murmur Abdomen: soft, non-tender, no masses  Extremities: warm, well-perfused, pulses palpable, no LE edema  Rectal/GU Deferred  Neuro: Grossly non-focal and symmetrical throughout  Skin: Clean and dry, no rashes, no breakdown  Diagnostic Tests:  Transesophageal Echocardiography  Patient: Darren Haynes, Darren Haynes  MR #: [id number]  Study Date: 12/11/2018  Gender: M  Age: 75  Height: 182.9 cm  Weight: 84 kg  BSA: 2.07 m^2  Pt. Status:  Room:  ORDERING Nanetta Batty, MD  REFERRING Nanetta Batty, MD  ADMITTING Thurmon Fair, MD  ATTENDING Thurmon Fair, MD  SONOGRAPHER Ilona Sorrel  cc:  -------------------------------------------------------------------  LV EF: 55% - 60%  -------------------------------------------------------------------  Study Conclusions  - Left ventricle: The cavity size was normal. Wall thickness was  normal. Systolic function was normal. The estimated ejection  fraction was in the range of 55% to 60%. Wall motion was normal;  there were no regional wall motion abnormalities.  - Mitral valve: Mild thickening of the anterior leaflet and   posterior leaflet, with shortened, mildly fused chords,  consistent with rheumatic disease. Leaflet separation was  severely reduced. Mobility was restricted. Diastolic leaflet  doming was present. The valvuloplasty score is 5 according to the  method of Abascal et al. (values 8 or below predict better  response to valvuloplasty) The findings are consistent with  severe stenosis. There was mild regurgitation  directed centrally.  Planimetered valve area: 0.96 cm^2. Valve area by pressure  half-time: 0.71 cm^2. Valve area by continuity equation (using  LVOT flow): 0.61 cm^2. Valve area (PISA): 0.55 cm^2.  - Left atrium: The atrium was moderately dilated. No evidence of  thrombus in the atrial cavity or appendage.  - Right atrium: No evidence of thrombus in the atrial cavity or  appendage.  -------------------------------------------------------------------  Study data: Procedure: Initial setup. The patient was brought to  the laboratory. Surface ECG leads were monitored. Sedation.  Conscious sedation was administered. Transesophageal  echocardiography. Topical anesthesia was obtained using viscous  lidocaine. A transesophageal probe was inserted by the attending  cardiologist. Image quality was adequate. Study completion: The  patient tolerated the procedure well. There were no complications.  Diagnostic transesophageal echocardiography. 2D and color  Doppler. Birthdate: Patient birthdate: Jul 15, 1986. Age: Patient  is 33 yr old. Sex: Gender: male. BMI: 25.1 kg/m^2. Study  date: Study date: 12/11/2018. Study time: 10:02 AM.  -------------------------------------------------------------------  -------------------------------------------------------------------  Left ventricle: The cavity size was normal. Wall thickness was  normal. Systolic function was normal. The estimated ejection  fraction was in the range of 55% to 60%. Wall motion was normal;  there were no regional wall motion  abnormalities.  -------------------------------------------------------------------  Aortic valve: Structurally normal valve. Trileaflet; normal  thickness leaflets. Cusp separation was normal. Doppler: There  was no significant regurgitation.  -------------------------------------------------------------------  Aorta: There was no atheroma. There was no evidence for  dissection. Aortic root: The aortic root was not dilated.  Ascending aorta: The ascending aorta was normal in size.  Aortic arch: The aortic arch was normal in size.  Descending aorta: The descending aorta was normal in size.  -------------------------------------------------------------------  Mitral valve: Mild thickening of the anterior leaflet and  posterior leaflet, with shortened, mildly fused chords, consistent  with rheumatic disease. Leaflet separation was severely reduced.  Mobility was restricted. Diastolic leaflet doming was present. The  valve was highly mobile; only tips were restricted. There was  subvalvular thickening up to a third of the chordal length, near  normal (4-41mm) leaflet thickness, and a single area of increased  echo brightness. The valvuloplasty score is 5 according to the  method of Abascal et al. [facility] 12:606, 1988). Values 8 or below  predict better response to valvuloplasty than values above 8,  though scores above 8 do not preclude the option of valvuloplasty.  Doppler: The findings are consistent with severe stenosis.  There was mild regurgitation directed centrally. Planimetered  valve area: 0.96 cm^2. Indexed valve area by planimetry: 0.46  cm^2/m^2. Valve area by pressure half-time: 0.71 cm^2. Indexed  valve area by pressure half-time: 0.34 cm^2/m^2. Valve area by  continuity equation (using LVOT flow): 0.61 cm^2. Indexed valve  area by continuity equation (using LVOT flow): 0.29 cm^2/m^2. Valve  area (PISA): 0.55 cm^2. Indexed valve area (PISA): 0.27 cm^2/m^2.  Mean gradient (D):  19 mm Hg. Peak gradient (D): 21 mm Hg.  -------------------------------------------------------------------  Left atrium: The atrium was moderately dilated. No evidence of  thrombus in the atrial cavity or appendage. The appendage was  morphologically a left appendage, multilobulated, and of normal  size. Emptying velocity was normal.  -------------------------------------------------------------------  Right ventricle: The cavity size was normal. Wall thickness was  normal. Systolic function was normal.  -------------------------------------------------------------------  Pulmonic valve: Structurally normal valve.  -------------------------------------------------------------------  Tricuspid valve: Structurally normal valve. Leaflet separation  was normal. Doppler: There was no significant regurgitation.  -------------------------------------------------------------------  Pulmonary artery: The main pulmonary  artery was normal-sized.  Systolic pressure was within the normal range.  -------------------------------------------------------------------  Right atrium: The atrium was normal in size. No evidence of  thrombus in the atrial cavity or appendage. The appendage was  morphologically a right appendage.  -------------------------------------------------------------------  Pericardium: There was no pericardial effusion.  -------------------------------------------------------------------  Measurements  Left ventricle Value Reference  Stroke volume, 2D 62 ml ---------  Stroke volume/bsa, 2D 30 ml/m^2 ---------  LV filling time, D, DP 530 ms ---------  LVOT Value Reference  LVOT ID, S 22.1 mm ---------  LVOT area 3.84 cm^2 ---------  LVOT peak velocity, S 92.5 cm/s ---------  LVOT mean velocity, S 67 cm/s ---------  LVOT VTI, S 18 cm ---------  Stroke volume (SV), LVOT DP 69.1 ml ---------  Stroke index (SV/bsa), LVOT DP 33.3 ml/m^2 ---------  Left atrium Value Reference  LA  ID, A-P, ES 44.41 mm ---------  LA ID/bsa, A-P 2.14 cm/m^2 <=2.2  Mitral valve Value Reference  Mitral valve area, planimetry 0.96 cm^2 ---------  Mitral valve area/bsa, planimetry 0.46 cm^2/m^2 ---------  Mitral valve peak flow rate 191.85 ml/sec ---------  Mitral E-wave peak velocity 228.15 cm/s ---------  Mitral mean velocity, D 211.11 cm/s ---------  Mitral deceleration slope 214.76 cm/s^2 ---------  Mitral deceleration time (H) 1062 ms 150 - 230  Mitral pressure half-time 308 ms ---------  Mitral mean gradient, D 19 mm Hg ---------  Mitral peak gradient, D 21 mm Hg ---------  Mitral valve area, PHT, DP 0.71 cm^2 ---------  Mitral valve area/bsa, PHT, DP 0.34 cm^2/m^2 ---------  Mitral valve area, LVOT 0.61 cm^2 ---------  continuity  Mitral valve area/bsa, LVOT 0.29 cm^2/m^2 ---------  continuity  Mitral maximal inflow velocity, 261.58 cm/s ---------  PISA  Mitral valve area, PISA 0.55 cm^2 ---------  Mitral valve area/bsa, PISA 0.27 cm^2/m^2 ---------  Mitral annulus VTI, D 102 cm ---------  Pulmonary arteries Value Reference  PA pressure, S, DP 25 mm Hg <=30  Tricuspid valve Value Reference  Tricuspid regurg peak velocity 207.59 cm/s ---------  Tricuspid peak RV-RA gradient 17 mm Hg ---------  Tricuspid maximal regurg 207.59 cm/s ---------  velocity, PISA  Systemic veins Value Reference  Estimated CVP 8 mm Hg ---------  Right ventricle Value Reference  RV pressure, S, DP 25 mm Hg <=30  Legend:  (L) and (H) mark values outside specified reference range.  -------------------------------------------------------------------  Prepared and Electronically Authenticated by  Thurmon Fair, MD  2020-01-30T11:45:39  RIGHT/LEFT HEART CATH AND CORONARY ANGIOGRAPHY  Conclusion  Hemodynamic findings consistent with mitral valve stenosis. Darren Haynes is a 33 y.o. male  [id number]  LOCATION: FACILITY: [facility]  PHYSICIAN: Nanetta Batty, M.D.  09-26-1986  DATE OF PROCEDURE:  12/11/2018  DATE OF DISCHARGE:   CARDIAC CATHETERIZATION  History obtained from chart review.Darren Haynes is a 33 y.o.. 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 He was referred by Dr. Hanley Hays for evaluation of symptomatic mitral stenosis. There is no history of rheumatic fever. He has no other cardiac risk factors. He is had progressive dyspnea for the last 9 months with some nonspecific aches and pains. Is being worked up by rheumatologist. Echo performed 11/21/2018 revealed normal LV systolic function, a peak mitral gradient of 47 mmHg with a mean of 29 mmHg and mitral valve area 1.5 cm with a PA pressure of 69 mmHg. He does have severe dyspnea and lower extreme edema.  He is on oral diuretic. He presented today for transesophageal echocardiogram performed Dr. Royann Shivers which showed classic rheumatic mitral stenosis with a mean gradient of 15 mmHg. He presents now for right left heart cath to define his anatomy and physiology.  IMPRESSION: Mr. Burnsworth has severe mitral stenosis with rheumatic features, normal LV function. His transvalvular gradient by TEE and right left heart cath for approximately 15 to 17 mmHg. Apparently his valvular anatomy is suitable for mitral valve balloon valvuloplasty. I have discussed the case with Dr. Regino Schultze at [facility] who has agreed to see him for consultation and treatment. The radial sheath was removed and a TR band was placed on the right wrist to achieve patent hemostasis. The patient left the lab in stable condition. He will be discharged home today as an outpatient and I will see him back in the office next week in follow-up.  Nanetta Batty. MD, [facility]  12/11/2018  11:59 AM   Surgeon Notes    12/11/2018 10:36 AM Op Note signed by Thurmon Fair, MD  Indications  Rheumatic mitral stenosis [I05.0 (ICD-10-CM)]  Procedural Details  Technical Details  PROCEDURE DESCRIPTION:   The patient was brought to the second floor [facility] cath lab in the postabsorptive state. He was premedicated with Versed and fentanyl which was given at the time of TEE. His right antecubital fossa and wrist was prepped and shaved in usual sterile fashion. Xylocaine 1% was used for local anesthesia. A 6 French sheath was inserted into the right radial artery using standard Seldinger technique. A 5 French sheath was inserted into the right antecubital vein. The patient received 4000 units of heparin intravenously. A 5 French balloontipped Swan-Ganz catheter was then advanced through the right heart chambers obtaining sequential pressures and pulmonary artery blood sample. A 5 [place] TIG catheter and pigtail catheters were used for selective coronary angiography and obtain left heart pressures as well as simultaneous LV/wedge pressure. Isovue dye was used for the entirety of the case. Retrograde aorta, left ventricular and pullback pressures were recorded. Radial cocktail was administered via the SideArm sheath. Estimated blood loss <50 mL.   During this procedure no sedation was administered.  Medications  (Filter: Administrations occurring from 12/11/18 1049 to 12/11/18 1155)          Medication Rate/Dose/Volume Action  Date Time   Heparin (Porcine) in NaCl 1000-0.9 UT/500ML-% SOLN (mL) 500 mL Given 12/11/18 1058   Total dose as of 04/20/19 1853 500 mL Given 1059   1,000 mL        lidocaine (PF) (XYLOCAINE) 1 % injection (mL) 2 mL Given 12/11/18 1112   Total dose as of 04/20/19 1853 2 mL Given 1112   4 mL        Radial Cocktail (Verapamil 2.5 mg, NTG, Lidocaine) (mL)  Given 12/11/18 1115   Total dose as of 04/20/19 1853        Cannot be calculated        heparin injection (Units) 4,000 Units Given 12/11/18 1129   Total dose as of 04/20/19 1853        4,000 Units        iohexol (OMNIPAQUE) 350 MG/ML injection (mL) 30 mL Given 12/11/18 1146   Total dose as  of 04/20/19 1853        30 mL        0.9 % sodium chloride infusion (mL/hr) 10 mL/hr Rate/Dose Change 12/11/18 1054   Dosing weight: 84 kg  Total dose as of 04/20/19 1853        71.33 mL        Coronary Findings  Diagnostic  Dominance: Right  No diagnostic findings have been documented.  Intervention  No interventions have been documented.  Right Heart  Right Heart Pressures Hemodynamic findings consistent with mitral valve stenosis. Right atrial pressure-6/4 Right ventricular pressure-58/0 Pulmonary artery pressure- 60/26, mean 40 Pulmonary wedge pressure- A-wave 22, V wave 23, mean 20 LVEDP-2 Cardiac output-5.52 L/min by Fick Mitral valve gradient-17 mmHg  Coronary Diagrams  Diagnostic  Dominance: Right   Intervention  Implants     No implant documentation for this case.  Syngo Images  Link to Procedure Log   Show images for CARDIAC CATHETERIZATION Procedure Log  Images on Long Term Storage    Show images for Darren Clementnman, [place]   [facility] Data   Most Recent Value  Fick Cardiac Output 5.52 L/min  Fick Cardiac Output Index 2.68 (L/min)/BSA  Mitral Mean Gradient 16.11 mmHg  Mitral Peak Gradient 17 mmHg  Mitral Valve Area Index 0.56 cm2/BSA  RA A Wave 6 mmHg  RA V Wave 4 mmHg  RA Mean 3 mmHg  RV Systolic Pressure 58 mmHg  RV Diastolic Pressure 0 mmHg  RV EDP 4 mmHg  PA Systolic Pressure 60 mmHg  PA Diastolic Pressure 26 mmHg  PA Mean 40 mmHg  PW A Wave 23 mmHg  PW V Wave 23 mmHg  PW Mean 20 mmHg  AO Systolic Pressure 96 mmHg  AO Diastolic Pressure 64 mmHg  AO Mean 76 mmHg  LV Systolic Pressure 97 mmHg  LV Diastolic Pressure 2 mmHg  LV EDP 4 mmHg  AOp Systolic Pressure 100 mmHg  AOp Diastolic Pressure 72 mmHg  AOp Mean Pressure 84 mmHg  LVp Systolic Pressure 102 mmHg  LVp Diastolic Pressure 0 mmHg  LVp EDP Pressure 3 mmHg  QP/QS 1  TPVR Index 14.94 HRUI  TSVR Index 28.39 HRUI  PVR SVR Ratio 0.27  TPVR/TSVR Ratio 0.53  BALLOON MITRAL VALVULOPLASTY        Cardiac Index (l/min/m2) 2.7 L/min/m2  Right Atrium Mean Pressure (mmHg) 5 mmHg  Right Ventricle Systolic Pressure (mmHg) 80 mmHg  Pulmonary Artery Mean Pressure (mmHg) 46 mmHg  Pulmonary Wedge Pressure (mmHg) 22 mmHg  Pulmonary Vascular Resistance (Wood units) 4.1 Wood units  Mitral Valve Stenosis Mean Gradient (mmHg) 15 mmHg  Other Result Information   This result has an attachment that is not available.   Result Narrative  Impressions:  Atrial angiogram performed for transseptal puncture planning; transseptal procedure performed using Baylis sheath and needle with RF applied x 1 Inoue balloon mitral valvuloplasty performed for severe, rheumatic MS (mean MVG 14.8 mm Hg, MVA 1.2 cm2 at baseline) PBMV performed using 28 mm balloon, 2 dilations at 24 and 25 mm After 2nd dilation, there was marked reduction in MV gradient and TTE showed no MR Post PBMV, mean MVG 4.7 mm Hg, MVA 2.8 cm2  Recommendations:  Sheaths out Observe for 4 hrs then possible same day discharge  Other Result Information  Interface, Text Results In - 01/01/2019 5:10 PM EST Impressions:  Atrial angiogram performed for transseptal puncture planning; transseptal procedure performed using Baylis sheath and needle with RF applied x 1 Inoue balloon mitral valvuloplasty performed for severe, rheumatic MS (mean MVG 14.8 mm Hg, MVA 1.2 cm2 at baseline) PBMV performed using 28 mm balloon, 2 dilations at 24 and 25 mm After 2nd dilation, there was marked reduction in MV  gradient and TTE showed no MR Post PBMV, mean MVG 4.7 mm Hg, MVA 2.8 cm2  ECHOCARDIOGRAM REPORT  Patient Name: Darren Haynes Date of Exam: 03/13/2019  Medical Rec #: [id number] Height: 74.0 in  Accession #: [id number] Weight: 208.0 lb  Date of Birth: 04/21/1986 BSA: 2.21 m  Patient Age: 40 years BP: 120/78 mmHg  Patient Gender: M HR: 80 bpm.  Exam Location: [address]  Procedure: 2D Echo, Color Doppler and Cardiac Doppler  Indications: Z98.890 S/p  mitral balloon valvuloplasty  History: Patient has prior history of Echocardiogram examinations, most  recent 11/21/2018. S/p Mitral balloon valvuloplasty and Mitral  Stenosis Risk Factors: Former Smoker.  Sonographer: Samule Ohm RDCS  Referring Phys: [phone] JONATHAN J BERRY  IMPRESSIONS  1. The left ventricle has mildly reduced systolic function, with an ejection fraction of 45-50%. The cavity size was normal. Left ventricular diastolic Doppler parameters are consistent with impaired relaxation.  2. Endocardium is difficult to see. LVEF is appears mildly decreased with inferior hypokinesis WOuld consider limited TTE with Definity to further define wall motion and LVEF.  3. Small pericardial effusion.  4. The mitral valve is abnormal. Moderate thickening of the mitral valve leaflet. Mild-moderate mitral valve stenosis.  5. MV is thickend with restricted diastolic motion. Peak and mean gradients through the valve are 20 and 8 mm Hg respectively. MVA by P T1/2 is 1.69 cm2 consistent with mild to moderate MS.  6. The aortic valve is tricuspid. Mild thickening of the aortic valve.  FINDINGS  Left Ventricle: The left ventricle has mildly reduced systolic function, with an ejection fraction of 45-50%. The cavity size was normal. There is no increase in left ventricular wall thickness. Left ventricular diastolic Doppler parameters are  consistent with impaired relaxation. Endocardium is difficult to see. LVEF is appears mildly decreased with inferior hypokinesis WOuld consider limited TTE with Definity to further define wall motion and LVEF.  Right Ventricle: The right ventricle has mildly reduced systolic function. The cavity was normal. There is no increase in right ventricular wall thickness.  Left Atrium: Left atrial size was normal in size.  Right Atrium: Right atrial size was normal in size. Right atrial pressure is estimated at 3 mmHg.  Interatrial Septum: No atrial level shunt detected by color  flow Doppler.  Pericardium: A small pericardial effusion is present.  Mitral Valve: The mitral valve is abnormal. Moderate thickening of the mitral valve leaflet. Mitral valve regurgitation is trivial by color flow Doppler. Mild-moderate mitral valve stenosis. MV is thickend with restricted diastolic motion. Peak and mean  gradients through the valve are 20 and 8 mm Hg respectively. MVA by P T1/2 is 1.69 cm2 consistent with mild to moderate MS.  Tricuspid Valve: The tricuspid valve is normal in structure. Tricuspid valve regurgitation is trivial by color flow Doppler.  Aortic Valve: The aortic valve is tricuspid Mild thickening of the aortic valve. Aortic valve regurgitation was not visualized by color flow Doppler.  Pulmonic Valve: The pulmonic valve was grossly normal. Pulmonic valve regurgitation is not visualized by color flow Doppler.  +--------------+--------++  LEFT VENTRICLE  +----------------+----------++  +--------------+--------++ Diastology    PLAX 2D   +----------------+----------++  +--------------+--------++ LV e' lateral: 11.20 cm/s  LVIDd: 4.60 cm  +----------------+----------++  +--------------+--------++ LV E/e' lateral:12.9   LVIDs: 3.20 cm  +----------------+----------++  +--------------+--------++ LV e' medial: 7.29 cm/s   LV PW: 1.00 cm  +----------------+----------++  +--------------+--------++ LV E/e' medial: 19.9   LV IVS: 1.00 cm  +----------------+----------++  +--------------+--------++  LVOT diam: 2.30 cm   +--------------+--------++  LV SV: 56 ml   +--------------+--------++  LV SV Index: 25.27   +--------------+--------++  LVOT Area: 4.15 cm  +--------------+--------++      +--------------+--------++  +---------------+---------++  RIGHT VENTRICLE   +---------------+---------++  RV S prime: 8.38 cm/s  +---------------+---------++  TAPSE (M-mode):1.3 cm    +---------------+---------++  +---------------+-------++-----------++  LEFT ATRIUM  Index   +---------------+-------++-----------++  LA diam: 3.60 cm1.63 cm/m   +---------------+-------++-----------++  LA Vol (A2C): 64.9 ml29.37 ml/m  +---------------+-------++-----------++  LA Vol (A4C): 52.6 ml23.80 ml/m  +---------------+-------++-----------++  LA Biplane Vol:58.9 ml26.65 ml/m  +---------------+-------++-----------++  +------------+---------++-----------++  RIGHT ATRIUM Index   +------------+---------++-----------++  RA Area: 13.90 cm   +------------+---------++-----------++  RA Volume: 33.80 ml 15.29 ml/m  +------------+---------++-----------++  +------------+-----------++  AORTIC VALVE   +------------+-----------++  LVOT Vmax: 84.20 cm/s   +------------+-----------++  LVOT Vmean: 60.500 cm/s  +------------+-----------++  LVOT VTI: 0.148 m   +------------+-----------++  +-------------+-------++  AORTA    +-------------+-------++  Ao Root diam:2.80 cm  +-------------+-------++  Ao Asc diam: 2.90 cm  +-------------+-------++  +--------------+-----------++ +---------------+-----------++  MITRAL VALVE   TRICUSPID VALVE   +--------------+-----------++ +---------------+-----------++  MV Area (PHT):1.71 cm  TR Peak grad: 17.1 mmHg   +--------------+-----------++ +---------------+-----------++  MV Peak grad: 20.4 mmHg  TR Vmax: 219.00 cm/s  +--------------+-----------++ +---------------+-----------++  MV Mean grad: 8.0 mmHg   +--------------+-----------++ +--------------+-------+  MV Vmax: 2.26 m/s  SHUNTS    +--------------+-----------++ +--------------+-------+  MV Vmean: 135.0 cm/s  Systemic VTI: 0.15 m   +--------------+-----------++ +--------------+-------+  MV VTI: 0.53 m  Systemic Diam:2.30 cm  +--------------+-----------++  +--------------+-------+  MV PHT: 128.76 msec  +--------------+-----------++  MV Decel Time:444 msec   +--------------+-----------++  +--------------+-----------++  MV E velocity:145.00 cm/s  +--------------+-----------++  MV A velocity:207.00 cm/s  +--------------+-----------++  MV E/A ratio: 0.70   +--------------+-----------++  +---------+-------+  IVC    +---------+-------+  IVC diam:0.70 cm  +---------+-------+  Dietrich Pates MD  Electronically signed by Dietrich Pates MD  Signature Date/Time: 03/13/2019/8:58:23 PM  TRANSESOPHOGEAL ECHO REPORT  Patient Name: Darren Haynes Date of Exam: 03/27/2019  Medical Rec #: [id number] Height: 73.0 in  Accession #: [id number] Weight: 213.0 lb  Date of Birth: 06-13-1986 BSA: 2.21 m  Patient Age: 48 years BP: 151/72 mmHg  Patient Gender: M HR: 72 bpm.  Exam Location: Inpatient  Procedure: Transesophageal Echo  Indications: Mitral Stenosis Evaluation  History: Patient has prior history of Echocardiogram examinations, most  recent 03/13/2019. Mitral Stenosis. Pulmonary hypertension.  mitral valvuloplasty.  Sonographer: Celene Skeen RDCS (AE)  Referring Phys: [id number] PHILIP J NAHSER  Diagnosing Phys: Kristeen Miss MD  PROCEDURE: The transesophogeal probe was passed through the esophogus of the patient. The patient developed no complications during the procedure.  IMPRESSIONS  1. No evidence of a thrombus present in the left atrial appendage.  2. The mitral valve is abnormal. Mild thickening of the mitral valve leaflet. Moderate-severe mitral valve stenosis.  3. 3 D images of the MV were obtained . Suggestive of moderate - severe mitral stenosis.  4. Aortic valve regurgitation is trivial by color flow Doppler.  5. The aortic root, ascending aorta and descending aorta are normal in size and structure.  6. The left ventricle has mildly reduced systolic function, with an ejection fraction of 45-50%.  FINDINGS  Left Ventricle:  The left ventricle has mildly reduced systolic function, with an ejection fraction of 45-50%.  Left Atrium: Left atrial size was normal in size.  Left Atrial Appendage: No evidence  of a thrombus present in the left atrial appendage.  Interatrial Septum: No atrial level shunt detected by color flow Doppler.  Mitral Valve: The mitral valve is abnormal. Mild thickening of the mitral valve leaflet. Mitral valve regurgitation is mild by color flow Doppler. Moderate-severe mitral valve stenosis. 3 D images of the MV were obtained . Suggestive of moderate - severe  mitral stenosis.  Tricuspid Valve: The tricuspid valve was normal in structure. Tricuspid valve regurgitation is trivial by color flow Doppler.  Aortic Valve: The aortic valve is normal in structure. Aortic valve regurgitation is trivial by color flow Doppler.  Aorta: The aortic root and ascending aorta are normal in size and structure.  +--------------+-----------++  MITRAL VALVE    +--------------+-----------++  MV Area (PHT):1.29 cm   +--------------+-----------++  MV Peak grad: 18.3 mmHg   +--------------+-----------++  MV Mean grad: 11.0 mmHg   +--------------+-----------++  MV Vmax: 2.14 m/s   +--------------+-----------++  MV Vmean: 150.0 cm/s   +--------------+-----------++  MV VTI: 0.64 m   +--------------+-----------++  MV PHT: 171.00 msec  +--------------+-----------++  Kristeen MissPhilip Nahser MD  Electronically signed by Kristeen MissPhilip Nahser MD  Signature Date/Time: 03/27/2019/1:19:53 PM   CT ANGIOGRAPHY CHEST, ABDOMEN AND PELVIS  TECHNIQUE: Multidetector CT imaging through the chest, abdomen and pelvis was performed using the standard protocol during bolus administration of intravenous contrast. Multiplanar reconstructed images and MIPs were obtained and reviewed to evaluate the vascular anatomy.  CONTRAST: 75mL ISOVUE-370 IOPAMIDOL (ISOVUE-370) INJECTION 76%  COMPARISON: CT of the chest  without contrast on 06/11/2018. Chest x-ray on 04/21/2019  FINDINGS: CTA CHEST FINDINGS  Cardiovascular: The thoracic aorta is normal in caliber without evidence of aneurysm or stenosis. The aortic arch is normally positioned. Proximal great vessels demonstrate normal patency and normal branching anatomy. The heart appears normal in size. No pericardial fluid identified. No calcified coronary artery plaque is identified. Central pulmonary arteries are normal in caliber. No venous anomalies identified in the chest. Pulmonary venous anatomy is normal. Normally patent left atrial appendage.  Mediastinum/Nodes: No enlarged mediastinal, hilar, or axillary lymph nodes. Thyroid gland, trachea, and esophagus demonstrate no significant findings.  Lungs/Pleura: There is mild scattered patchy ground-glass opacity in the anterior left upper lobe and lingula not seen previously by CT. This is occult by recent x-ray. Correlation suggested with any respiratory or infectious symptoms. No overt edema, pleural fluid, nodules, significant chronic lung disease or pneumothorax identified.  Musculoskeletal: No chest wall abnormality. No acute or significant osseous findings.  Review of the MIP images confirms the above findings.  CTA ABDOMEN AND PELVIS FINDINGS  VASCULAR  Aorta: The abdominal aorta is normally patent and of normal caliber without evidence of aneurysm, stenosis or atherosclerosis.  Celiac: Focal narrowing at the origin of the celiac axis does not appear atherosclerotic and likely is due to arcuate ligament compression. The celiac axis and distal branches are patent.  SMA: Normally patent.  Renals: There are 2 separate right renal arteries originating off of the abdominal aorta and 3 separate left renal arteries. All vessels are normally patent.  IMA: Normally patent.  Inflow: Bilateral common, external and internal iliac arteries demonstrate normal patency  without evidence of aneurysm, stenosis or atherosclerosis. Common femoral arteries and femoral bifurcations are normally patent.  Veins: On arterial phase of imaging, no venous anomalies or abnormalities are identified.  Review of the MIP images confirms the above findings.  NON-VASCULAR  Hepatobiliary: The liver demonstrates diffuse steatosis without evidence of overt cirrhosis or focal lesions. No biliary dilatation.  The gallbladder is contracted and unremarkable in appearance.  Pancreas: Unremarkable. No pancreatic ductal dilatation or surrounding inflammatory changes.  Spleen: Normal in size without focal abnormality.  Adrenals/Urinary Tract: Adrenal glands are unremarkable. Kidneys are normal, without renal calculi, focal lesion, or hydronephrosis. Bladder is unremarkable.  Stomach/Bowel: Bowel shows no evidence of obstruction, ileus, inflammation or lesion. No free air identified. The appendix is normal.  Lymphatic: No enlarged lymph nodes identified in the abdomen or pelvis.  Reproductive: Prostate is unremarkable.  Other: No hernias or abnormal fluid collections identified.  Musculoskeletal: No acute or significant osseous findings.  Review of the MIP images confirms the above findings.  IMPRESSION: 1. Normal thoracic aorta and visualized proximal great vessels. 2. Normal abdominal aorta, iliac arteries and common femoral arteries. 3. 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 4. Incidental multiple renal arteries with 2 separate renal arteries on the right and 3 separate renal arteries on the left. All are widely patent. 5. Diffuse hepatic steatosis without evidence of overt cirrhosis.   Electronically Signed By: Irish Lack M.D. 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 He presents with rapidly recurrent symptoms of exertional shortness of breath and chest discomfort consistent with chronic diastolic congestive heart failure, [place] Heart Association functional class IIIb. He has recently been having symptoms with minimal activity and occasionally at rest. He cannot lay flat in bed. I personally reviewed the patient's multiple previous echocardiograms and diagnostic cardiac catheterization. I agree that he would probably best be treated with mitral valve replacement. Risks associated with surgery will be adversely affected by the patient's underlying neurologic condition and need for chronic immunosuppression.  CT angiography reveals no contraindication to peripheral cannulation for surgery.  Plan:  The patient waswascounseled at length regarding the indications, risks and potential benefits of mitral valve replacement. The rationale for elective surgery has been explained, including a comparison between surgery, another attempt at balloon mitral valvuloplasty, andcontinued medical therapy with close follow-up. Alternative surgical approaches have been discussed including a comparison between conventional sternotomy and minimally-invasive techniques. The relative risks and benefits of each have been reviewed as they pertain to the patient's specific circumstances, and expectations for the patient's postoperative convalescence has been discussed. The potential adverse impact of the patient's coexisting progressive neurologic disease and need for chronic immunosuppression have been discussed. The patient desiresto proceed with surgery as soon as practical.We discussed the possibility of replacing the mitral valve using a mechanical prosthesis with the attendant need for long-term anticoagulation versus the alternative of replacing it using a bioprosthetic tissue valve with its potential for late structural  valve deterioration and failure, depending upon the patient's longevity. The patient specifically requests that if the mitral valve must be replaced that it be done using a mechanicalvalve.   The patient understands and accepts all potential risks of surgery including but not limited to risk of death, stroke or other neurologic complication, myocardial infarction, congestive heart failure, respiratory failure, renal failure, bleeding requiring transfusion and/or reexploration, arrhythmia, infection or other wound complications, pneumonia, pleural and/or pericardial effusion, pulmonary embolus, aortic dissection or other major vascular complication, or delayed complications related to valve repair or replacement including but not limited to structural valve deterioration and failure, thrombosis, embolization, endocarditis, or paravalvular leak.  Specific risks potentially related to the minimally-invasive approach were discussed at  length, including but not limited to risk of conversion to full or partial sternotomy, aortic dissection or other major vascular complication, unilateral acute lung injury or pulmonary edema, phrenic nerve dysfunction or paralysis, rib fracture, chronic pain, lung hernia, or lymphocele.  Expectations for the patient's postoperative convalescence have been discussed.  All of his questions have been answered.     Salvatore Decent. Cornelius Moras, MD 05/11/2019 12:02 PM

## 2019-05-13 NOTE — Anesthesia Preprocedure Evaluation (Addendum)
Anesthesia Evaluation  Patient identified by MRN, date of birth, ID band Patient awake    Reviewed: Allergy & Precautions, H&P , NPO status , Patient's Chart, lab work & pertinent test results  Airway Mallampati: II  TM Distance: >3 FB Neck ROM: Full    Dental no notable dental hx. (+) Teeth Intact, Dental Advisory Given   Pulmonary neg pulmonary ROS,    Pulmonary exam normal breath sounds clear to auscultation       Cardiovascular Exercise Tolerance: Good +CHF  + Valvular Problems/Murmurs  Rhythm:Regular Rate:Normal     Neuro/Psych Anxiety Depression negative neurological ROS     GI/Hepatic Neg liver ROS, GERD  Medicated and Controlled,  Endo/Other  negative endocrine ROS  Renal/GU negative Renal ROS  negative genitourinary   Musculoskeletal  (+) Arthritis ,   Abdominal   Peds  Hematology negative hematology ROS (+)   Anesthesia Other Findings   Reproductive/Obstetrics negative OB ROS                            Anesthesia Physical Anesthesia Plan  ASA: IV  Anesthesia Plan: General   Post-op Pain Management:    Induction: Intravenous  PONV Risk Score and Plan: 3 and Ondansetron, Dexamethasone and Midazolam  Airway Management Planned: Double Lumen EBT  Additional Equipment: Arterial line, CVP, PA Cath, TEE, 3D TEE and Ultrasound Guidance Line Placement  Intra-op Plan:   Post-operative Plan: Possible Post-op intubation/ventilation  Informed Consent: I have reviewed the patients History and Physical, chart, labs and discussed the procedure including the risks, benefits and alternatives for the proposed anesthesia with the patient or authorized representative who has indicated his/her understanding and acceptance.     Dental advisory given  Plan Discussed with: CRNA  Anesthesia Plan Comments:         Anesthesia Quick Evaluation

## 2019-05-14 ENCOUNTER — Encounter (HOSPITAL_COMMUNITY): Payer: Self-pay

## 2019-05-14 ENCOUNTER — Other Ambulatory Visit: Payer: Self-pay

## 2019-05-14 ENCOUNTER — Inpatient Hospital Stay (HOSPITAL_COMMUNITY): Payer: BC Managed Care – PPO

## 2019-05-14 ENCOUNTER — Inpatient Hospital Stay (HOSPITAL_COMMUNITY): Payer: BC Managed Care – PPO | Admitting: Certified Registered Nurse Anesthetist

## 2019-05-14 ENCOUNTER — Inpatient Hospital Stay (HOSPITAL_COMMUNITY): Payer: BC Managed Care – PPO | Admitting: Physician Assistant

## 2019-05-14 ENCOUNTER — Encounter (HOSPITAL_COMMUNITY)
Admission: RE | Disposition: A | Discharge status: Home / Self Care | Payer: Self-pay | Source: Home / Self Care | Attending: Thoracic Surgery (Cardiothoracic Vascular Surgery)

## 2019-05-14 ENCOUNTER — Inpatient Hospital Stay (HOSPITAL_COMMUNITY)
Admission: RE | Admit: 2019-05-14 | Discharge: 2019-05-19 | DRG: 220 | Disposition: A | Discharge status: Home / Self Care | Payer: BC Managed Care – PPO | Attending: Thoracic Surgery (Cardiothoracic Vascular Surgery) | Admitting: Thoracic Surgery (Cardiothoracic Vascular Surgery)

## 2019-05-14 DIAGNOSIS — I050 Rheumatic mitral stenosis: Secondary | ICD-10-CM | POA: Diagnosis present

## 2019-05-14 DIAGNOSIS — I480 Paroxysmal atrial fibrillation: Secondary | ICD-10-CM | POA: Diagnosis not present

## 2019-05-14 DIAGNOSIS — K219 Gastro-esophageal reflux disease without esophagitis: Secondary | ICD-10-CM | POA: Diagnosis present

## 2019-05-14 DIAGNOSIS — Z8349 Family history of other endocrine, nutritional and metabolic diseases: Secondary | ICD-10-CM

## 2019-05-14 DIAGNOSIS — Z79899 Other long term (current) drug therapy: Secondary | ICD-10-CM | POA: Diagnosis not present

## 2019-05-14 DIAGNOSIS — M069 Rheumatoid arthritis, unspecified: Secondary | ICD-10-CM | POA: Diagnosis present

## 2019-05-14 DIAGNOSIS — I5032 Chronic diastolic (congestive) heart failure: Secondary | ICD-10-CM | POA: Diagnosis present

## 2019-05-14 DIAGNOSIS — G35 Multiple sclerosis: Secondary | ICD-10-CM | POA: Diagnosis present

## 2019-05-14 DIAGNOSIS — G8929 Other chronic pain: Secondary | ICD-10-CM | POA: Diagnosis present

## 2019-05-14 DIAGNOSIS — I110 Hypertensive heart disease with heart failure: Secondary | ICD-10-CM | POA: Diagnosis present

## 2019-05-14 DIAGNOSIS — I272 Pulmonary hypertension, unspecified: Secondary | ICD-10-CM | POA: Diagnosis present

## 2019-05-14 DIAGNOSIS — I340 Nonrheumatic mitral (valve) insufficiency: Secondary | ICD-10-CM | POA: Diagnosis not present

## 2019-05-14 DIAGNOSIS — Z79891 Long term (current) use of opiate analgesic: Secondary | ICD-10-CM

## 2019-05-14 DIAGNOSIS — Z7982 Long term (current) use of aspirin: Secondary | ICD-10-CM | POA: Diagnosis not present

## 2019-05-14 DIAGNOSIS — D62 Acute posthemorrhagic anemia: Secondary | ICD-10-CM | POA: Diagnosis not present

## 2019-05-14 DIAGNOSIS — F410 Panic disorder [episodic paroxysmal anxiety] without agoraphobia: Secondary | ICD-10-CM | POA: Diagnosis present

## 2019-05-14 DIAGNOSIS — Z952 Presence of prosthetic heart valve: Secondary | ICD-10-CM

## 2019-05-14 DIAGNOSIS — J9811 Atelectasis: Secondary | ICD-10-CM | POA: Diagnosis not present

## 2019-05-14 DIAGNOSIS — I442 Atrioventricular block, complete: Secondary | ICD-10-CM | POA: Diagnosis present

## 2019-05-14 DIAGNOSIS — Z720 Tobacco use: Secondary | ICD-10-CM

## 2019-05-14 DIAGNOSIS — Z7951 Long term (current) use of inhaled steroids: Secondary | ICD-10-CM | POA: Diagnosis not present

## 2019-05-14 DIAGNOSIS — Z8249 Family history of ischemic heart disease and other diseases of the circulatory system: Secondary | ICD-10-CM

## 2019-05-14 DIAGNOSIS — R0609 Other forms of dyspnea: Secondary | ICD-10-CM | POA: Diagnosis present

## 2019-05-14 DIAGNOSIS — Z954 Presence of other heart-valve replacement: Secondary | ICD-10-CM | POA: Diagnosis not present

## 2019-05-14 DIAGNOSIS — R0600 Dyspnea, unspecified: Secondary | ICD-10-CM | POA: Diagnosis present

## 2019-05-14 DIAGNOSIS — Z818 Family history of other mental and behavioral disorders: Secondary | ICD-10-CM | POA: Diagnosis not present

## 2019-05-14 HISTORY — PX: MITRAL VALVE REPLACEMENT: SHX147

## 2019-05-14 HISTORY — DX: Presence of other heart-valve replacement: Z95.4

## 2019-05-14 HISTORY — PX: TEE WITHOUT CARDIOVERSION: SHX5443

## 2019-05-14 LAB — GLUCOSE, CAPILLARY
Glucose-Capillary: 124 mg/dL — ABNORMAL HIGH (ref 70–99)
Glucose-Capillary: 113 mg/dL — ABNORMAL HIGH (ref 70–99)
Glucose-Capillary: 110 mg/dL — ABNORMAL HIGH (ref 70–99)
Glucose-Capillary: 120 mg/dL — ABNORMAL HIGH (ref 70–99)
Glucose-Capillary: 123 mg/dL — ABNORMAL HIGH (ref 70–99)
Glucose-Capillary: 144 mg/dL — ABNORMAL HIGH (ref 70–99)
Glucose-Capillary: 116 mg/dL — ABNORMAL HIGH (ref 70–99)
Glucose-Capillary: 143 mg/dL — ABNORMAL HIGH (ref 70–99)
Glucose-Capillary: 128 mg/dL — ABNORMAL HIGH (ref 70–99)

## 2019-05-14 LAB — POCT I-STAT 4, (NA,K, GLUC, HGB,HCT)
Glucose, Bld: 99 mg/dL (ref 70–99)
Glucose, Bld: 117 mg/dL — ABNORMAL HIGH (ref 70–99)
Glucose, Bld: 124 mg/dL — ABNORMAL HIGH (ref 70–99)
Glucose, Bld: 141 mg/dL — ABNORMAL HIGH (ref 70–99)
Glucose, Bld: 134 mg/dL — ABNORMAL HIGH (ref 70–99)
Glucose, Bld: 108 mg/dL — ABNORMAL HIGH (ref 70–99)
Glucose, Bld: 100 mg/dL — ABNORMAL HIGH (ref 70–99)
HCT: 33 % — ABNORMAL LOW (ref 39.0–52.0)
HCT: 28 % — ABNORMAL LOW (ref 39.0–52.0)
HCT: 31 % — ABNORMAL LOW (ref 39.0–52.0)
HCT: 25 % — ABNORMAL LOW (ref 39.0–52.0)
HCT: 25 % — ABNORMAL LOW (ref 39.0–52.0)
HCT: 31 % — ABNORMAL LOW (ref 39.0–52.0)
HCT: 27 % — ABNORMAL LOW (ref 39.0–52.0)
Hemoglobin: 11.2 g/dL — ABNORMAL LOW (ref 13.0–17.0)
Hemoglobin: 10.5 g/dL — ABNORMAL LOW (ref 13.0–17.0)
Hemoglobin: 9.5 g/dL — ABNORMAL LOW (ref 13.0–17.0)
Hemoglobin: 8.5 g/dL — ABNORMAL LOW (ref 13.0–17.0)
Hemoglobin: 8.5 g/dL — ABNORMAL LOW (ref 13.0–17.0)
Hemoglobin: 10.5 g/dL — ABNORMAL LOW (ref 13.0–17.0)
Hemoglobin: 9.2 g/dL — ABNORMAL LOW (ref 13.0–17.0)
Potassium: 3.7 mmol/L (ref 3.5–5.1)
Potassium: 3.8 mmol/L (ref 3.5–5.1)
Potassium: 3.9 mmol/L (ref 3.5–5.1)
Potassium: 4.2 mmol/L (ref 3.5–5.1)
Potassium: 4.9 mmol/L (ref 3.5–5.1)
Potassium: 4.3 mmol/L (ref 3.5–5.1)
Potassium: 4.4 mmol/L (ref 3.5–5.1)
Sodium: 139 mmol/L (ref 135–145)
Sodium: 137 mmol/L (ref 135–145)
Sodium: 138 mmol/L (ref 135–145)
Sodium: 137 mmol/L (ref 135–145)
Sodium: 136 mmol/L (ref 135–145)
Sodium: 139 mmol/L (ref 135–145)
Sodium: 139 mmol/L (ref 135–145)

## 2019-05-14 LAB — CBC
HCT: 29.2 % — ABNORMAL LOW (ref 39.0–52.0)
HCT: 31 % — ABNORMAL LOW (ref 39.0–52.0)
Hemoglobin: 10 g/dL — ABNORMAL LOW (ref 13.0–17.0)
Hemoglobin: 10.8 g/dL — ABNORMAL LOW (ref 13.0–17.0)
MCH: 32.4 pg (ref 26.0–34.0)
MCH: 32.5 pg (ref 26.0–34.0)
MCHC: 34.2 g/dL (ref 30.0–36.0)
MCHC: 34.8 g/dL (ref 30.0–36.0)
MCV: 94.5 fL (ref 80.0–100.0)
MCV: 93.4 fL (ref 80.0–100.0)
Platelets: 129 10*3/uL — ABNORMAL LOW (ref 150–400)
Platelets: 142 10*3/uL — ABNORMAL LOW (ref 150–400)
RBC: 3.09 MIL/uL — ABNORMAL LOW (ref 4.22–5.81)
RBC: 3.32 MIL/uL — ABNORMAL LOW (ref 4.22–5.81)
RDW: 12.9 % (ref 11.5–15.5)
RDW: 12.8 % (ref 11.5–15.5)
WBC: 9 10*3/uL (ref 4.0–10.5)
WBC: 10.6 10*3/uL — ABNORMAL HIGH (ref 4.0–10.5)
nRBC: 0 % (ref 0.0–0.2)
nRBC: 0 % (ref 0.0–0.2)

## 2019-05-14 LAB — POCT I-STAT 7, (LYTES, BLD GAS, ICA,H+H)
Acid-Base Excess: 1 mmol/L (ref 0.0–2.0)
Acid-base deficit: 4 mmol/L — ABNORMAL HIGH (ref 0.0–2.0)
Acid-base deficit: 3 mmol/L — ABNORMAL HIGH (ref 0.0–2.0)
Acid-base deficit: 2 mmol/L (ref 0.0–2.0)
Bicarbonate: 26.7 mmol/L (ref 20.0–28.0)
Bicarbonate: 23.3 mmol/L (ref 20.0–28.0)
Bicarbonate: 24 mmol/L (ref 20.0–28.0)
Bicarbonate: 24.6 mmol/L (ref 20.0–28.0)
Calcium, Ion: 1.02 mmol/L — ABNORMAL LOW (ref 1.15–1.40)
Calcium, Ion: 1.18 mmol/L (ref 1.15–1.40)
Calcium, Ion: 1.21 mmol/L (ref 1.15–1.40)
Calcium, Ion: 1.23 mmol/L (ref 1.15–1.40)
HCT: 26 % — ABNORMAL LOW (ref 39.0–52.0)
HCT: 26 % — ABNORMAL LOW (ref 39.0–52.0)
HCT: 31 % — ABNORMAL LOW (ref 39.0–52.0)
HCT: 31 % — ABNORMAL LOW (ref 39.0–52.0)
Hemoglobin: 8.8 g/dL — ABNORMAL LOW (ref 13.0–17.0)
Hemoglobin: 8.8 g/dL — ABNORMAL LOW (ref 13.0–17.0)
Hemoglobin: 10.5 g/dL — ABNORMAL LOW (ref 13.0–17.0)
Hemoglobin: 10.5 g/dL — ABNORMAL LOW (ref 13.0–17.0)
O2 Saturation: 100 %
O2 Saturation: 92 %
O2 Saturation: 97 %
O2 Saturation: 99 %
Patient temperature: 35.2
Patient temperature: 35.8
Potassium: 3.8 mmol/L (ref 3.5–5.1)
Potassium: 4.3 mmol/L (ref 3.5–5.1)
Potassium: 4.6 mmol/L (ref 3.5–5.1)
Potassium: 4.5 mmol/L (ref 3.5–5.1)
Sodium: 139 mmol/L (ref 135–145)
Sodium: 138 mmol/L (ref 135–145)
Sodium: 139 mmol/L (ref 135–145)
Sodium: 138 mmol/L (ref 135–145)
TCO2: 28 mmol/L (ref 22–32)
TCO2: 25 mmol/L (ref 22–32)
TCO2: 26 mmol/L (ref 22–32)
TCO2: 26 mmol/L (ref 22–32)
pCO2 arterial: 48.8 mmHg — ABNORMAL HIGH (ref 32.0–48.0)
pCO2 arterial: 56.3 mmHg — ABNORMAL HIGH (ref 32.0–48.0)
pCO2 arterial: 46.6 mmHg (ref 32.0–48.0)
pCO2 arterial: 49.5 mmHg — ABNORMAL HIGH (ref 32.0–48.0)
pH, Arterial: 7.346 — ABNORMAL LOW (ref 7.350–7.450)
pH, Arterial: 7.224 — ABNORMAL LOW (ref 7.350–7.450)
pH, Arterial: 7.312 — ABNORMAL LOW (ref 7.350–7.450)
pH, Arterial: 7.298 — ABNORMAL LOW (ref 7.350–7.450)
pO2, Arterial: 314 mmHg — ABNORMAL HIGH (ref 83.0–108.0)
pO2, Arterial: 79 mmHg — ABNORMAL LOW (ref 83.0–108.0)
pO2, Arterial: 91 mmHg (ref 83.0–108.0)
pO2, Arterial: 154 mmHg — ABNORMAL HIGH (ref 83.0–108.0)

## 2019-05-14 LAB — BASIC METABOLIC PANEL
Anion gap: 9 (ref 5–15)
BUN: 14 mg/dL (ref 6–20)
CO2: 21 mmol/L — ABNORMAL LOW (ref 22–32)
Calcium: 7.8 mg/dL — ABNORMAL LOW (ref 8.9–10.3)
Chloride: 107 mmol/L (ref 98–111)
Creatinine, Ser: 0.92 mg/dL (ref 0.61–1.24)
GFR calc Af Amer: >60 mL/min (ref >60)
GFR calc non Af Amer: >60 mL/min (ref >60)
Glucose, Bld: 122 mg/dL — ABNORMAL HIGH (ref 70–99)
Potassium: 3.9 mmol/L (ref 3.5–5.1)
Sodium: 137 mmol/L (ref 135–145)

## 2019-05-14 LAB — PROTIME-INR
INR: 1.5 — ABNORMAL HIGH (ref 0.8–1.2)
Prothrombin Time: 17.8 seconds — ABNORMAL HIGH (ref 11.4–15.2)

## 2019-05-14 LAB — HEMOGLOBIN AND HEMATOCRIT, BLOOD
HCT: 24.9 % — ABNORMAL LOW (ref 39.0–52.0)
Hemoglobin: 8.7 g/dL — ABNORMAL LOW (ref 13.0–17.0)

## 2019-05-14 LAB — ECHO INTRAOPERATIVE TEE
Height: 74 in
Weight: 3376 oz

## 2019-05-14 LAB — APTT: aPTT: 37 seconds — ABNORMAL HIGH (ref 24–36)

## 2019-05-14 LAB — PLATELET COUNT: Platelets: 132 10*3/uL — ABNORMAL LOW (ref 150–400)

## 2019-05-14 LAB — MAGNESIUM: Magnesium: 3.3 mg/dL — ABNORMAL HIGH (ref 1.7–2.4)

## 2019-05-14 SURGERY — REPLACEMENT, MITRAL VALVE, MINIMALLY INVASIVE
Anesthesia: General | Site: Chest | Laterality: Right

## 2019-05-14 MED ORDER — LACTATED RINGERS IV SOLN
INTRAVENOUS | Status: DC
  Administered 2019-05-14: 21:00:00 via INTRAVENOUS

## 2019-05-14 MED ORDER — SUGAMMADEX SODIUM 200 MG/2ML IV SOLN
INTRAVENOUS | Status: DC | PRN
  Administered 2019-05-14: 200 mg via INTRAVENOUS

## 2019-05-14 MED ORDER — PLASMA-LYTE 148 IV SOLN
INTRAVENOUS | Status: DC | PRN
  Administered 2019-05-14: 07:00:00 500 mL via INTRAVASCULAR

## 2019-05-14 MED ORDER — VANCOMYCIN HCL 1000 MG IV SOLR
INTRAVENOUS | Status: DC | PRN
  Administered 2019-05-14: 10:00:00 1000 mL

## 2019-05-14 MED ORDER — LACTATED RINGERS IV SOLN
INTRAVENOUS | Status: DC | PRN
  Administered 2019-05-14: 07:00:00 via INTRAVENOUS

## 2019-05-14 MED ORDER — PHENYLEPHRINE HCL-NACL 20-0.9 MG/250ML-% IV SOLN: 0.0000 ug/min | INTRAVENOUS | Status: DC

## 2019-05-14 MED ORDER — PROPOFOL 10 MG/ML IV BOLUS
INTRAVENOUS | Status: AC
  Filled 2019-05-14: qty 20

## 2019-05-14 MED ORDER — SODIUM CHLORIDE 0.9% FLUSH: 3.0000 mL | INTRAVENOUS | Status: DC | PRN

## 2019-05-14 MED ORDER — ASPIRIN 81 MG PO CHEW: 324.0000 mg | CHEWABLE_TABLET | Freq: Every day | ORAL | Status: DC

## 2019-05-14 MED ORDER — ACETAMINOPHEN 160 MG/5ML PO SOLN: 1000.0000 mg | Freq: Four times a day (QID) | ORAL | Status: DC

## 2019-05-14 MED ORDER — ONDANSETRON HCL 4 MG/2ML IJ SOLN: 4.0000 mg | Freq: Four times a day (QID) | INTRAMUSCULAR | Status: DC | PRN

## 2019-05-14 MED ORDER — ACETAMINOPHEN 500 MG PO TABS
ORAL_TABLET | ORAL | Status: AC
  Administered 2019-05-14: 1000 mg via ORAL
  Filled 2019-05-14: qty 2

## 2019-05-14 MED ORDER — CHLORHEXIDINE GLUCONATE 4 % EX LIQD: 30.0000 mL | CUTANEOUS | Status: DC

## 2019-05-14 MED ORDER — FENTANYL CITRATE (PF) 250 MCG/5ML IJ SOLN
INTRAMUSCULAR | Status: AC
  Filled 2019-05-14: qty 25

## 2019-05-14 MED ORDER — ALBUMIN HUMAN 5 % IV SOLN
INTRAVENOUS | Status: DC | PRN
  Administered 2019-05-14: 12:00:00 via INTRAVENOUS

## 2019-05-14 MED ORDER — NITROGLYCERIN IN D5W 200-5 MCG/ML-% IV SOLN
0.0000 ug/min | INTRAVENOUS | Status: DC
  Administered 2019-05-14: 5 ug/min via INTRAVENOUS

## 2019-05-14 MED ORDER — MIDAZOLAM HCL 5 MG/5ML IJ SOLN
INTRAMUSCULAR | Status: DC | PRN
  Administered 2019-05-14 (×6): 1 mg via INTRAVENOUS

## 2019-05-14 MED ORDER — BISACODYL 10 MG RE SUPP: 10.0000 mg | Freq: Every day | RECTAL | Status: DC

## 2019-05-14 MED ORDER — BUPIVACAINE 0.5 % ON-Q PUMP SINGLE CATH 400 ML
INJECTION | Status: DC | PRN
  Administered 2019-05-14: 400 mL

## 2019-05-14 MED ORDER — BUPIVACAINE HCL (PF) 0.5 % IJ SOLN
INTRAMUSCULAR | Status: AC
  Filled 2019-05-14: qty 10

## 2019-05-14 MED ORDER — BUPIVACAINE 0.5 % ON-Q PUMP SINGLE CATH 400 ML
400.0000 mL | INJECTION | Status: DC
  Filled 2019-05-14: qty 400

## 2019-05-14 MED ORDER — PHENYLEPHRINE 40 MCG/ML (10ML) SYRINGE FOR IV PUSH (FOR BLOOD PRESSURE SUPPORT)
PREFILLED_SYRINGE | INTRAVENOUS | Status: AC
  Filled 2019-05-14: qty 10

## 2019-05-14 MED ORDER — MIDAZOLAM HCL 2 MG/2ML IJ SOLN: 2.0000 mg | INTRAMUSCULAR | Status: DC | PRN

## 2019-05-14 MED ORDER — SUCCINYLCHOLINE CHLORIDE 20 MG/ML IJ SOLN
INTRAMUSCULAR | Status: DC | PRN
  Administered 2019-05-14: 100 mg via INTRAVENOUS

## 2019-05-14 MED ORDER — SODIUM CHLORIDE 0.9 % IV SOLN
1.5000 g | Freq: Two times a day (BID) | INTRAVENOUS | Status: AC
  Administered 2019-05-14 – 2019-05-16 (×4): 1.5 g via INTRAVENOUS
  Filled 2019-05-14 (×5): qty 1.5

## 2019-05-14 MED ORDER — SODIUM CHLORIDE 0.9 % IV SOLN
INTRAVENOUS | Status: DC
  Administered 2019-05-14: 15:00:00 via INTRAVENOUS

## 2019-05-14 MED ORDER — ALBUMIN HUMAN 5 % IV SOLN: 250.0000 mL | INTRAVENOUS | Status: DC | PRN

## 2019-05-14 MED ORDER — DEXMEDETOMIDINE HCL IN NACL 200 MCG/50ML IV SOLN: 0.0000 ug/kg/h | INTRAVENOUS | Status: DC

## 2019-05-14 MED ORDER — POTASSIUM CHLORIDE 10 MEQ/50ML IV SOLN: 10.0000 meq | INTRAVENOUS | Status: AC

## 2019-05-14 MED ORDER — CHLORHEXIDINE GLUCONATE 0.12 % MT SOLN
15.0000 mL | OROMUCOSAL | Status: AC
  Administered 2019-05-14: 15 mL via OROMUCOSAL

## 2019-05-14 MED ORDER — FENTANYL CITRATE (PF) 250 MCG/5ML IJ SOLN
INTRAMUSCULAR | Status: DC | PRN
  Administered 2019-05-14 (×2): 50 ug via INTRAVENOUS
  Administered 2019-05-14: 500 ug via INTRAVENOUS
  Administered 2019-05-14: 100 ug via INTRAVENOUS
  Administered 2019-05-14 (×11): 50 ug via INTRAVENOUS

## 2019-05-14 MED ORDER — SODIUM CHLORIDE 0.9 % IV SOLN: 250.0000 mL | INTRAVENOUS | Status: DC

## 2019-05-14 MED ORDER — METOPROLOL TARTRATE 12.5 MG HALF TABLET
12.5000 mg | ORAL_TABLET | Freq: Once | ORAL | Status: AC
  Administered 2019-05-14: 12.5 mg via ORAL
  Filled 2019-05-14: qty 1

## 2019-05-14 MED ORDER — LACTATED RINGERS IV SOLN
INTRAVENOUS | Status: DC | PRN
  Administered 2019-05-14 (×2): via INTRAVENOUS

## 2019-05-14 MED ORDER — CHLORHEXIDINE GLUCONATE 0.12 % MT SOLN
15.0000 mL | Freq: Once | OROMUCOSAL | Status: AC
  Administered 2019-05-14: 15 mL via OROMUCOSAL
  Filled 2019-05-14: qty 15

## 2019-05-14 MED ORDER — ACETAMINOPHEN 500 MG PO TABS
1000.0000 mg | ORAL_TABLET | Freq: Four times a day (QID) | ORAL | Status: DC
  Administered 2019-05-14 – 2019-05-18 (×12): 1000 mg via ORAL
  Filled 2019-05-14 (×15): qty 2

## 2019-05-14 MED ORDER — 0.9 % SODIUM CHLORIDE (POUR BTL) OPTIME
TOPICAL | Status: DC | PRN
  Administered 2019-05-14: 07:00:00 5000 mL

## 2019-05-14 MED ORDER — VANCOMYCIN HCL IN DEXTROSE 1-5 GM/200ML-% IV SOLN
1000.0000 mg | Freq: Once | INTRAVENOUS | Status: AC
  Administered 2019-05-14: 1000 mg via INTRAVENOUS
  Filled 2019-05-14: qty 200

## 2019-05-14 MED ORDER — DOCUSATE SODIUM 100 MG PO CAPS
200.0000 mg | ORAL_CAPSULE | Freq: Every day | ORAL | Status: DC
  Administered 2019-05-15: 200 mg via ORAL
  Filled 2019-05-14 (×2): qty 2

## 2019-05-14 MED ORDER — SODIUM CHLORIDE 0.9% FLUSH
3.0000 mL | Freq: Two times a day (BID) | INTRAVENOUS | Status: DC
  Administered 2019-05-15 – 2019-05-16 (×3): 3 mL via INTRAVENOUS

## 2019-05-14 MED ORDER — LACTATED RINGERS IV SOLN: 500.0000 mL | Freq: Once | INTRAVENOUS | Status: DC | PRN

## 2019-05-14 MED ORDER — ROCURONIUM BROMIDE 10 MG/ML (PF) SYRINGE
PREFILLED_SYRINGE | INTRAVENOUS | Status: DC | PRN
  Administered 2019-05-14: 20 mg via INTRAVENOUS
  Administered 2019-05-14: 40 mg via INTRAVENOUS
  Administered 2019-05-14: 60 mg via INTRAVENOUS
  Administered 2019-05-14: 50 mg via INTRAVENOUS

## 2019-05-14 MED ORDER — ONDANSETRON HCL 4 MG/2ML IJ SOLN
INTRAMUSCULAR | Status: DC | PRN
  Administered 2019-05-14: 4 mg via INTRAVENOUS

## 2019-05-14 MED ORDER — ASPIRIN EC 325 MG PO TBEC: 325.0000 mg | DELAYED_RELEASE_TABLET | Freq: Every day | ORAL | Status: DC

## 2019-05-14 MED ORDER — ACETAMINOPHEN 650 MG RE SUPP: 650.0000 mg | Freq: Once | RECTAL | Status: AC

## 2019-05-14 MED ORDER — SUCCINYLCHOLINE CHLORIDE 200 MG/10ML IV SOSY
PREFILLED_SYRINGE | INTRAVENOUS | Status: AC
  Filled 2019-05-14: qty 10

## 2019-05-14 MED ORDER — INSULIN REGULAR BOLUS VIA INFUSION
0.0000 [IU] | Freq: Three times a day (TID) | INTRAVENOUS | Status: DC
  Filled 2019-05-14: qty 10

## 2019-05-14 MED ORDER — PROTAMINE SULFATE 10 MG/ML IV SOLN
INTRAVENOUS | Status: DC | PRN
  Administered 2019-05-14: 320 mg via INTRAVENOUS
  Administered 2019-05-14: 20 mg via INTRAVENOUS

## 2019-05-14 MED ORDER — HEPARIN SODIUM (PORCINE) 1000 UNIT/ML IJ SOLN
INTRAMUSCULAR | Status: DC | PRN
  Administered 2019-05-14: 34000 [IU] via INTRAVENOUS

## 2019-05-14 MED ORDER — PROTAMINE SULFATE 10 MG/ML IV SOLN
INTRAVENOUS | Status: AC
  Filled 2019-05-14: qty 5

## 2019-05-14 MED ORDER — BUPIVACAINE HCL 0.5 % IJ SOLN
INTRAMUSCULAR | Status: DC | PRN
  Administered 2019-05-14: 10 mL

## 2019-05-14 MED ORDER — METOPROLOL TARTRATE 5 MG/5ML IV SOLN
2.5000 mg | INTRAVENOUS | Status: DC | PRN
  Administered 2019-05-14 – 2019-05-15 (×3): 5 mg via INTRAVENOUS
  Filled 2019-05-14 (×3): qty 5

## 2019-05-14 MED ORDER — HEPARIN SODIUM (PORCINE) 1000 UNIT/ML IJ SOLN
INTRAMUSCULAR | Status: AC
  Filled 2019-05-14: qty 1

## 2019-05-14 MED ORDER — ACETAMINOPHEN 500 MG PO TABS
1000.0000 mg | ORAL_TABLET | Freq: Once | ORAL | Status: AC
  Administered 2019-05-14: 06:00:00 1000 mg via ORAL

## 2019-05-14 MED ORDER — INSULIN REGULAR(HUMAN) IN NACL 100-0.9 UT/100ML-% IV SOLN: INTRAVENOUS | Status: DC

## 2019-05-14 MED ORDER — ACETAMINOPHEN 160 MG/5ML PO SOLN
650.0000 mg | Freq: Once | ORAL | Status: AC
  Administered 2019-05-14: 650 mg
  Filled 2019-05-14: qty 20.3

## 2019-05-14 MED ORDER — TRAMADOL HCL 50 MG PO TABS: 50.0000 mg | ORAL_TABLET | ORAL | Status: DC | PRN

## 2019-05-14 MED ORDER — MIDAZOLAM HCL (PF) 10 MG/2ML IJ SOLN
INTRAMUSCULAR | Status: AC
  Filled 2019-05-14: qty 2

## 2019-05-14 MED ORDER — FAMOTIDINE IN NACL 20-0.9 MG/50ML-% IV SOLN
20.0000 mg | Freq: Two times a day (BID) | INTRAVENOUS | Status: DC
  Administered 2019-05-14: 20 mg via INTRAVENOUS

## 2019-05-14 MED ORDER — PANTOPRAZOLE SODIUM 40 MG PO TBEC
40.0000 mg | DELAYED_RELEASE_TABLET | Freq: Every day | ORAL | Status: DC
  Administered 2019-05-16 – 2019-05-19 (×4): 40 mg via ORAL
  Filled 2019-05-14 (×4): qty 1

## 2019-05-14 MED ORDER — OXYCODONE HCL 5 MG PO TABS
5.0000 mg | ORAL_TABLET | ORAL | Status: DC | PRN
  Administered 2019-05-14 – 2019-05-19 (×25): 10 mg via ORAL
  Filled 2019-05-14 (×8): qty 2
  Filled 2019-05-14: qty 1
  Filled 2019-05-14 (×17): qty 2

## 2019-05-14 MED ORDER — PROTAMINE SULFATE 10 MG/ML IV SOLN
INTRAVENOUS | Status: AC
  Filled 2019-05-14: qty 25

## 2019-05-14 MED ORDER — CHLORHEXIDINE GLUCONATE CLOTH 2 % EX PADS
6.0000 | MEDICATED_PAD | Freq: Every day | CUTANEOUS | Status: DC
  Administered 2019-05-14 – 2019-05-15 (×2): 6 via TOPICAL

## 2019-05-14 MED ORDER — DEXAMETHASONE SODIUM PHOSPHATE 10 MG/ML IJ SOLN
INTRAMUSCULAR | Status: DC | PRN
  Administered 2019-05-14: 5 mg via INTRAVENOUS

## 2019-05-14 MED ORDER — MORPHINE SULFATE (PF) 2 MG/ML IV SOLN
1.0000 mg | INTRAVENOUS | Status: DC | PRN
  Administered 2019-05-14 (×2): 4 mg via INTRAVENOUS
  Administered 2019-05-14 (×2): 2 mg via INTRAVENOUS
  Administered 2019-05-15 (×4): 4 mg via INTRAVENOUS
  Filled 2019-05-14 (×4): qty 2
  Filled 2019-05-14: qty 1
  Filled 2019-05-14: qty 2
  Filled 2019-05-14: qty 1
  Filled 2019-05-14: qty 2

## 2019-05-14 MED ORDER — ROCURONIUM BROMIDE 10 MG/ML (PF) SYRINGE
PREFILLED_SYRINGE | INTRAVENOUS | Status: AC
  Filled 2019-05-14: qty 30

## 2019-05-14 MED ORDER — BISACODYL 5 MG PO TBEC
10.0000 mg | DELAYED_RELEASE_TABLET | Freq: Every day | ORAL | Status: DC
  Administered 2019-05-15 – 2019-05-17 (×2): 10 mg via ORAL
  Filled 2019-05-14 (×4): qty 2

## 2019-05-14 MED ORDER — SODIUM CHLORIDE 0.45 % IV SOLN
INTRAVENOUS | Status: DC | PRN
  Administered 2019-05-14: 14:00:00 via INTRAVENOUS

## 2019-05-14 MED ORDER — MAGNESIUM SULFATE 4 GM/100ML IV SOLN
4.0000 g | Freq: Once | INTRAVENOUS | Status: AC
  Administered 2019-05-14: 4 g via INTRAVENOUS
  Filled 2019-05-14: qty 100

## 2019-05-14 MED ORDER — LACTATED RINGERS IV SOLN: INTRAVENOUS | Status: DC

## 2019-05-14 SURGICAL SUPPLY — 102 items
ADAPTER CARDIO PERF ANTE/RETRO (ADAPTER) ×3 IMPLANT
APPLICATOR COTTON TIP 6 STRL (MISCELLANEOUS) IMPLANT
APPLICATOR COTTON TIP 6IN STRL (MISCELLANEOUS) ×3
BAG DECANTER FOR FLEXI CONT (MISCELLANEOUS) ×3 IMPLANT
BLADE SURG 11 STRL SS (BLADE) ×4 IMPLANT
CANISTER SUCT 3000ML PPV (MISCELLANEOUS) ×6 IMPLANT
CANNULA FEM VENOUS REMOTE 22FR (CANNULA) ×1 IMPLANT
CANNULA FEMORAL ART 14 SM (MISCELLANEOUS) ×3 IMPLANT
CANNULA GUNDRY RCSP 15FR (MISCELLANEOUS) ×3 IMPLANT
CANNULA OPTISITE PERFUSION 16F (CANNULA) IMPLANT
CANNULA OPTISITE PERFUSION 18F (CANNULA) ×1 IMPLANT
CANNULA SUMP PERICARDIAL (CANNULA) ×6 IMPLANT
CATH CPB KIT OWEN (MISCELLANEOUS) IMPLANT
CATH KIT ON-Q SILVERSOAK 5 (CATHETERS) IMPLANT
CATH KIT ON-Q SILVERSOAK 5IN (CATHETERS) ×3 IMPLANT
CONN ST 1/4X3/8  BEN (MISCELLANEOUS) ×2
CONN ST 1/4X3/8 BEN (MISCELLANEOUS) ×4 IMPLANT
CONNECTOR 1/2X3/8X1/2 3 WAY (MISCELLANEOUS) ×1
CONNECTOR 1/2X3/8X1/2 3WAY (MISCELLANEOUS) ×2 IMPLANT
CONT SPEC 4OZ CLIKSEAL STRL BL (MISCELLANEOUS) ×3 IMPLANT
COVER BACK TABLE 24X17X13 BIG (DRAPES) ×3 IMPLANT
COVER WAND RF STERILE (DRAPES) ×2 IMPLANT
DERMABOND ADHESIVE PROPEN (GAUZE/BANDAGES/DRESSINGS) ×1
DERMABOND ADVANCED (GAUZE/BANDAGES/DRESSINGS) ×2
DERMABOND ADVANCED .7 DNX12 (GAUZE/BANDAGES/DRESSINGS) ×4 IMPLANT
DERMABOND ADVANCED .7 DNX6 (GAUZE/BANDAGES/DRESSINGS) IMPLANT
DEVICE CLOSURE PERCLS PRGLD 6F (VASCULAR PRODUCTS) IMPLANT
DEVICE SUT CK QUICK LOAD INDV (Prosthesis & Implant Heart) ×3 IMPLANT
DEVICE SUT CK QUICK LOAD MINI (Prosthesis & Implant Heart) ×1 IMPLANT
DEVICE TROCAR PUNCTURE CLOSURE (ENDOMECHANICALS) ×3 IMPLANT
DRAIN CHANNEL 28F RND 3/8 FF (WOUND CARE) ×6 IMPLANT
DRAPE BILATERAL SPLIT (DRAPES) ×3 IMPLANT
DRAPE C-ARM 42X72 X-RAY (DRAPES) ×3 IMPLANT
DRAPE CV SPLIT W-CLR ANES SCRN (DRAPES) ×3 IMPLANT
DRAPE INCISE IOBAN 66X45 STRL (DRAPES) ×6 IMPLANT
DRAPE SLUSH MACHINE 52X66 (DRAPES) ×1 IMPLANT
DRAPE SLUSH/WARMER DISC (DRAPES) ×2 IMPLANT
DRSG AQUACEL AG ADV 3.5X10 (GAUZE/BANDAGES/DRESSINGS) ×1 IMPLANT
DRSG COVADERM 4X8 (GAUZE/BANDAGES/DRESSINGS) ×2 IMPLANT
ELECT BLADE 6.5 EXT (BLADE) ×3 IMPLANT
ELECT REM PT RETURN 9FT ADLT (ELECTROSURGICAL) ×6
ELECTRODE REM PT RTRN 9FT ADLT (ELECTROSURGICAL) ×4 IMPLANT
FELT TEFLON 1X6 (MISCELLANEOUS) ×5 IMPLANT
FEMORAL VENOUS CANN RAP (CANNULA) IMPLANT
GAUZE SPONGE 4X4 12PLY STRL (GAUZE/BANDAGES/DRESSINGS) ×3 IMPLANT
GLOVE BIO SURGEON STRL SZ 6.5 (GLOVE) ×3 IMPLANT
GLOVE ORTHO TXT STRL SZ7.5 (GLOVE) ×9 IMPLANT
GOWN STRL REUS W/ TWL LRG LVL3 (GOWN DISPOSABLE) ×8 IMPLANT
GOWN STRL REUS W/ TWL XL LVL3 (GOWN DISPOSABLE) IMPLANT
GOWN STRL REUS W/TWL LRG LVL3 (GOWN DISPOSABLE) ×8
GOWN STRL REUS W/TWL XL LVL3 (GOWN DISPOSABLE) ×2
IV NS 1000ML (IV SOLUTION)
IV NS 1000ML BAXH (IV SOLUTION) IMPLANT
IV NS IRRIG 3000ML ARTHROMATIC (IV SOLUTION) ×1 IMPLANT
KIT BASIN OR (CUSTOM PROCEDURE TRAY) ×3 IMPLANT
KIT DEVICE SUT COR-KNOT MIS 5 (INSTRUMENTS) ×3 IMPLANT
KIT DILATOR VASC 18G NDL (KITS) ×3 IMPLANT
KIT DRAINAGE VACCUM ASSIST (KITS) ×1 IMPLANT
KIT SUCTION CATH 14FR (SUCTIONS) ×3 IMPLANT
KIT SUT CK MINI COMBO 4X17 (Prosthesis & Implant Heart) ×1 IMPLANT
KIT TURNOVER KIT B (KITS) ×3 IMPLANT
LEAD PACING MYOCARDI (MISCELLANEOUS) ×3 IMPLANT
LINE VENT (MISCELLANEOUS) ×1 IMPLANT
NDL AORTIC ROOT 14G 7F (CATHETERS) ×2 IMPLANT
NEEDLE AORTIC ROOT 14G 7F (CATHETERS) ×3 IMPLANT
NS IRRIG 1000ML POUR BTL (IV SOLUTION) ×14 IMPLANT
PACK E MIN INVASIVE VALVE (SUTURE) ×3 IMPLANT
PACK OPEN HEART (CUSTOM PROCEDURE TRAY) ×3 IMPLANT
PAD ARMBOARD 7.5X6 YLW CONV (MISCELLANEOUS) ×6 IMPLANT
PAD ELECT DEFIB RADIOL ZOLL (MISCELLANEOUS) ×3 IMPLANT
PERCLOSE PROGLIDE 6F (VASCULAR PRODUCTS) ×12
POSITIONER HEAD DONUT 9IN (MISCELLANEOUS) ×3 IMPLANT
SET CANNULATION TOURNIQUET (MISCELLANEOUS) ×3 IMPLANT
SET CARDIOPLEGIA MPS 5001102 (MISCELLANEOUS) ×1 IMPLANT
SET IRRIG TUBING LAPAROSCOPIC (IRRIGATION / IRRIGATOR) ×3 IMPLANT
SET MICROPUNCTURE 5F STIFF (MISCELLANEOUS) ×2 IMPLANT
SHEATH PINNACLE 8F 10CM (SHEATH) ×3 IMPLANT
SOLUTION ANTI FOG 6CC (MISCELLANEOUS) ×3 IMPLANT
SUT BONE WAX W31G (SUTURE) ×3 IMPLANT
SUT ETHIBON 2 0 V 52N 30 (SUTURE) ×1 IMPLANT
SUT ETHIBOND 2 0 SH (SUTURE) ×2 IMPLANT
SUT ETHIBOND X763 2 0 SH 1 (SUTURE) ×3 IMPLANT
SUT GORETEX CV 4 TH 22 36 (SUTURE) ×2 IMPLANT
SUT GORETEX CV4 TH-18 (SUTURE) ×4 IMPLANT
SUT PROLENE 3 0 SH DA (SUTURE) ×1 IMPLANT
SUT PROLENE 4 0 RB 1 (SUTURE) ×2
SUT PROLENE 4-0 RB1 .5 CRCL 36 (SUTURE) IMPLANT
SYR 10ML LL (SYRINGE) ×2 IMPLANT
SYSTEM SAHARA CHEST DRAIN ATS (WOUND CARE) ×3 IMPLANT
TAPE CLOTH SURG 4X10 WHT LF (GAUZE/BANDAGES/DRESSINGS) ×1 IMPLANT
TAPE PAPER 2X10 WHT MICROPORE (GAUZE/BANDAGES/DRESSINGS) ×1 IMPLANT
TOWEL GREEN STERILE (TOWEL DISPOSABLE) ×3 IMPLANT
TOWEL GREEN STERILE FF (TOWEL DISPOSABLE) ×2 IMPLANT
TRAY FOLEY SLVR 16FR TEMP STAT (SET/KITS/TRAYS/PACK) ×3 IMPLANT
TROCAR XCEL BLADELESS 5X75MML (TROCAR) ×3 IMPLANT
TROCAR XCEL NON-BLD 11X100MML (ENDOMECHANICALS) ×6 IMPLANT
TUBE SUCT INTRACARD DLP 20F (MISCELLANEOUS) ×3 IMPLANT
TUNNELER SHEATH ON-Q 11GX8 DSP (PAIN MANAGEMENT) ×2 IMPLANT
UNDERPAD 30X30 (UNDERPADS AND DIAPERS) ×3 IMPLANT
VALVE MITRAL 29MM (Prosthesis & Implant Heart) ×1 IMPLANT
WATER STERILE IRR 1000ML POUR (IV SOLUTION) ×6 IMPLANT
WIRE .035 3MM-J 145CM (WIRE) ×3 IMPLANT

## 2019-05-14 NOTE — Anesthesia Procedure Notes (Signed)
Central Venous Catheter Insertion Performed by: Annye Asa, MD, anesthesiologist Start/End7/12/2018 6:50 AM, 05/14/2019 7:07 AM Patient location: Pre-op. Preanesthetic checklist: patient identified, IV checked, risks and benefits discussed, surgical consent, monitors and equipment checked, pre-op evaluation, timeout performed and anesthesia consent Position: Trendelenburg Lidocaine 1% used for infiltration and patient sedated Hand hygiene performed , maximum sterile barriers used  and Seldinger technique used Catheter size: 8.5 Fr PA cath was placed.Sheath introducer Swan type:thermodilution Procedure performed using ultrasound guided technique. Ultrasound Notes:anatomy identified, needle tip was noted to be adjacent to the nerve/plexus identified, no ultrasound evidence of intravascular and/or intraneural injection and image(s) printed for medical record Attempts: 1 Following insertion, line sutured, dressing applied and Biopatch. Post procedure assessment: blood return through all ports, free fluid flow and no air  Complications: transient, self-limited VT. Patient tolerated the procedure well with no immediate complications. Additional procedure comments: PA catheter:  Routine monitors. Timeout, sterile prep, drape, FBP l neck.  Trendelenburg position.  1% Lido local, finder and trocar LIJ 1st pass with US guidance.  Cordis placed over J wire. PA catheter in easily.  Sterile dressing applied.  Patient tolerated well, VSS.  Jenita Seashore, MD.

## 2019-05-14 NOTE — Brief Op Note (Signed)
05/14/2019  12:55 PM  PATIENT:  Darren Haynes  33 y.o. male  PRE-OPERATIVE DIAGNOSIS:  MITRAL STENOSIS  POST-OPERATIVE DIAGNOSIS:  MITRAL STENOSIS  PROCEDURE:  Procedure(s): MINIMALLY INVASIVE MITRAL VALVE (MV) REPLACEMENT USING CARBOMEDICS OPTIFORM Size 103mm (Right) TRANSESOPHAGEAL ECHOCARDIOGRAM (TEE) (N/A)  SURGEON:  Surgeon(s) and Role:    * Rexene Alberts, MD - Primary    * Wonda Olds, MD - Assisting   ANESTHESIA:   general  EBL:  400   BLOOD ADMINISTERED:none  DRAINS: 2 Chest Tube(s) in the right pleural space and mediastinum   LOCAL MEDICATIONS USED:  BUPIVICAINE   SPECIMEN:  No Specimen and Source of Specimen:  mitral valve  DISPOSITION OF SPECIMEN:  PATHOLOGY  COUNTS:  YES  TOURNIQUET:  * No tourniquets in log *  DICTATION: .Note written in EPIC  PLAN OF CARE: Admit to inpatient   PATIENT DISPOSITION:  ICU - extubated and stable.   Delay start of Pharmacological VTE agent (>24hrs) due to surgical blood loss or risk of bleeding: yes  Rexene Alberts, MD 05/14/2019 12:56 PM

## 2019-05-14 NOTE — Progress Notes (Signed)
  Echocardiogram Echocardiogram Transesophageal has been performed.  Oneal Deputy Azrael Maddix 05/14/2019, 8:38 AM

## 2019-05-14 NOTE — Progress Notes (Signed)
     [address]       [place], [address]             [phone]      Extubated  Off inotropres and vasoactive meds  Vitals:   05/14/19 1845 05/14/19 1900  BP:    Pulse: 90 89  Resp: 20 17  Temp: 98.6 F (37 C) 98.6 F (37 C)  SpO2: 100% 100%    Intake/Output Summary (Last 24 hours) at 05/14/2019 1916 Last data filed at 05/14/2019 1900 Gross per 24 hour  Intake 4001.1 ml  Output 2990 ml  Net 1011.1 ml   CBC    Component Value Date/Time   WBC 9.0 05/14/2019 1345   RBC 3.09 (L) 05/14/2019 1345   HGB 10.5 (L) 05/14/2019 1507   HGB 13.2 03/25/2019 1353   HCT 31.0 (L) 05/14/2019 1507   HCT 37.4 (L) 03/25/2019 1353   PLT 129 (L) 05/14/2019 1345   PLT 218 03/25/2019 1353   MCV 94.5 05/14/2019 1345   MCV 87 03/25/2019 1353   MCH 32.4 05/14/2019 1345   MCHC 34.2 05/14/2019 1345   RDW 12.9 05/14/2019 1345   RDW 14.8 03/25/2019 1353   LYMPHSABS 1.2 05/07/2019 2233   MONOABS 0.6 05/07/2019 2233   EOSABS 0.1 05/07/2019 2233   BASOSABS 0.0 05/07/2019 2233   BMP Latest Ref Rng & Units 05/14/2019 05/14/2019 05/14/2019  Glucose 70 - 99 mg/dL - - 100(H)  BUN 6 - 20 mg/dL - - -  Creatinine 0.61 - 1.24 mg/dL - - -  BUN/Creat Ratio 9 - 20 - - -  Sodium 135 - 145 mmol/L 138 139 139  Potassium 3.5 - 5.1 mmol/L 4.5 4.6 4.4  Chloride 98 - 111 mmol/L - - -  CO2 22 - 32 mmol/L - - -  Calcium 8.9 - 10.3 mg/dL - - -

## 2019-05-14 NOTE — Anesthesia Postprocedure Evaluation (Signed)
Anesthesia Post Note  Patient: Darren Haynes  Procedure(s) Performed: MINIMALLY INVASIVE MITRAL VALVE (MV) REPLACEMENT USING CARBOMEDICS OPTIFORM Size 6mm (Right Chest) TRANSESOPHAGEAL ECHOCARDIOGRAM (TEE) (N/A )     Patient location during evaluation: SICU Anesthesia Type: General Level of consciousness: awake and sedated Pain management: pain level controlled Vital Signs Assessment: post-procedure vital signs reviewed and stable Respiratory status: spontaneous breathing, nonlabored ventilation, respiratory function stable and patient connected to face mask oxygen Cardiovascular status: blood pressure returned to baseline and stable Postop Assessment: no apparent nausea or vomiting Anesthetic complications: no    Last Vitals:  Vitals:   05/14/19 1500 05/14/19 1515  BP: 121/66 109/66  Pulse: 82 87  Resp: (!) 23 (!) 24  Temp: (!) 35.7 C (!) 35.9 C  SpO2: 100% 98%    Last Pain:  Vitals:   05/14/19 1500  TempSrc:   PainSc: 10-Worst pain ever                 Jarita Raval,W. EDMOND

## 2019-05-14 NOTE — Transfer of Care (Signed)
Immediate Anesthesia Transfer of Care Note  Patient: Darren Haynes  Procedure(s) Performed: MINIMALLY INVASIVE MITRAL VALVE (MV) REPLACEMENT USING CARBOMEDICS OPTIFORM Size 77mm (Right Chest) TRANSESOPHAGEAL ECHOCARDIOGRAM (TEE) (N/A )  Patient Location: SICU  Anesthesia Type:General  Level of Consciousness: awake and patient cooperative  Airway & Oxygen Therapy: Patient Spontanous Breathing and non-rebreather face mask  Post-op Assessment: Report given to RN and Post -op Vital signs reviewed and stable  Post vital signs: Reviewed and stable  Last Vitals:  Vitals Value Taken Time  BP    Temp    Pulse    Resp    SpO2      Last Pain:  Vitals:   05/14/19 0559  TempSrc:   PainSc: 8       Patients Stated Pain Goal: 2 (10/30/74 8832)  Complications: No apparent anesthesia complications

## 2019-05-14 NOTE — Interval H&P Note (Signed)
History and Physical Interval Note:  05/14/2019 6:01 AM  Timoteo Expose  has presented today for surgery, with the diagnosis of MITRAL STENOSIS.  The various methods of treatment have been discussed with the patient and family. After consideration of risks, benefits and other options for treatment, the patient has consented to  Procedure(s): MINIMALLY INVASIVE MITRAL VALVE (MV) REPLACEMENT (Right) TRANSESOPHAGEAL ECHOCARDIOGRAM (TEE) (N/A) as a surgical intervention.  The patient's history has been reviewed, patient examined, no change in status, stable for surgery.  I have reviewed the patient's chart and labs.  Questions were answered to the patient's satisfaction.     Darren Haynes

## 2019-05-14 NOTE — Anesthesia Procedure Notes (Signed)
Procedure Name: Intubation Date/Time: 05/14/2019 8:05 AM Performed by: Clearnce Sorrel, CRNA Pre-anesthesia Checklist: Patient identified, Emergency Drugs available, Suction available, Patient being monitored and Timeout performed Patient Re-evaluated:Patient Re-evaluated prior to induction Oxygen Delivery Method: Circle system utilized Preoxygenation: Pre-oxygenation with 100% oxygen Induction Type: IV induction Ventilation: Mask ventilation without difficulty and Oral airway inserted - appropriate to patient size Laryngoscope Size: Mac, 4 and Glidescope Grade View: Grade III Tube type: Oral Endobronchial tube: Left, Double lumen EBT, EBT position confirmed by fiberoptic bronchoscope and EBT position confirmed by auscultation and 41 Fr Number of attempts: 2 Airway Equipment and Method: Stylet and Bougie stylet Placement Confirmation: ETT inserted through vocal cords under direct vision,  positive ETCO2 and breath sounds checked- equal and bilateral Tube secured with: Tape Dental Injury: Teeth and Oropharynx as per pre-operative assessment

## 2019-05-14 NOTE — Op Note (Signed)
CARDIOTHORACIC SURGERY OPERATIVE NOTE  Date of Procedure:  05/14/2019  Preoperative Diagnosis:   Rheumatic Heart Disease  Severe Mitral Stenosis  Postoperative Diagnosis: Same  Procedure:    Minimally-Invasive Mitral Valve Replacement  Sorin Carbomedics Bileaflet Mechanical Valve (size 29 mm, catalog # [id number], serial # [id number])    Surgeon: Salvatore Decent. Cornelius Moras, MD  Assistant: B. 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  Patient denies any known history of rheumatic fever during childhood although he does report that he apparently had several cases of Streptococcus. He reports a progressive history of symptoms of shortness of breath for more than a year. He was evaluated by multiple physicians and told there was nothing wrong with his heart. Eventually he developed more severe shortness of breath and a chest x-ray revealed mild pulmonary edema. He was evaluated by a physician's assistant at [facility] and referred to Dr. Hanley Hays. Transthoracic echocardiogram revealed normal left ventricular systolic function with severe mitral stenosis and signs of pulmonary hypertension. The patient was subsequently referred to Dr. Allyson Sabal who initially evaluated the patient in January of this year. TEE performed December 11, 2018 revealed findings consistent with rheumatic  mitral stenosis. Mean transvalvular gradient was estimated 19 mmHg. Valve area by pressure half-time was 0.71 cm. Using planimetry the valve area was 0.96 cm. By the continuity equation it measured 0.61 cm and using PISAit measured only 0.55 cm. There was only mild mitral regurgitation in the valvuloplasty score was 5. Left and right heart catheterization was performed by Dr. Allyson Sabal.There was normal coronary artery anatomy with no significant coronary artery disease. Mean transvalvular gradient across mitral valve was estimated 16.1 mmHg. There was moderate pulmonary hypertension with normal central venous pressure and cardiac output. The indexed mitral valve area was 0.56 cm/m.   The patient was referred to Dr. Regino Schultze at [facility] and underwent balloon mitral valvuloplasty on December 24, 2018. Although the procedure was technically successful, the patient was told by Dr. Regino Schultze that the improvement was less than optimal and long-term prognosis questionable. The patient enjoyed immediate improvement in his respiratory status, but within several weeks he began to develop worsening shortness of breath. Symptoms have continued to progress over the past 3 months, and the patient states that symptoms are almost as bad as they were prior to his valvuloplasty in February. He now gets short of breath with low-level activity and occasionally at rest. He has tightness across his chest that comes and goes but is essentially present most of the time. He cannot do much of any physical activity without having to stop to catch his breath. He cannot lay flat in bed. He has palpitations. He has had some mild lower extremity edema. He reports occasional dizzy spells without syncope. He has had increased symptoms of palpitations and previous long-term event monitor reveals sinus rhythm with sinus bradycardia, sinus tachycardia, PVCs, PACs, and occasional runs of supraventricular  tachycardia and nonsustained ventricular tachycardia. He  has not had any documented atrial fibrillation nor any other sustained arrhythmias. The patient recently underwent follow-up transthoracic and transesophageal echocardiograms which revealed some decline in left ventricular systolic function with ejection fraction estimated only 45 to 50%. There was at least moderate and probably severe persistent mitral stenosis. There was mild mitral regurgitation. Cardiothoracic surgical consultation was requested.  The patient has been seen in consultation and counseled at length regarding the indications, risks and potential benefits of surgery.  All questions have been answered, and the patient provides full informed consent for the operation as described.    DETAILS OF THE OPERATIVE PROCEDURE  Preparation:  The patient is brought to the operating room on the above mentioned date and central monitoring was established by the anesthesia team including placement of Swan-Ganz catheter through the left internal jugular vein.  A radial arterial line is placed. The patient is placed in the supine position on the operating table.  Intravenous antibiotics are administered. General endotracheal anesthesia is induced uneventfully. The patient is initially intubated using a dual lumen endotracheal tube.  A Foley catheter is placed.  Baseline transesophageal echocardiogram was performed.  Findings were notable for classical rheumatic mitral valve disease.  There was severe mitral stenosis and mild mitral regurgitation.  The mitral valve was severely thickened with moderate calcification.  There was severe restriction of mobility of both leaflets with thickening, foreshortening, and fusion of the subvalvular apparatus.  Aortic valve appeared normal.  Left ventricular size and function was normal.  Right ventricular size and function was normal.  There was trivial tricuspid regurgitation.  A soft roll is placed  behind the patient's left scapula and the neck gently extended and turned to the left.   The patient's right neck, chest, abdomen, both groins, and both lower extremities are prepared and draped in a sterile manner. A time out procedure is performed.   Percutaneous Vascular Access:  Percutaneous arterial and venous access were obtained on the right side.  Using ultrasound guidance the right common femoral vein was cannulated using the Seldinger technique a pair of Perclose vascular closure devises were placed at opposing 30 degree angles in the femoral vein, after which time an 8 French sheath inserted.  The right common femoral artery was cannulated using a micropuncture wire and sheath.  A pair of Perclose vascular closure devices were placed at opposing 30 degree angles in the femoral artery, and a 8 French sheath inserted.  The right internal jugular vein was cannulated  using ultrasound guidance and an 8 French sheath inserted.     Surgical Approach:  A right miniature anterolateral thoracotomy incision is performed. The incision is placed just lateral to and superior to the right nipple. The pectoralis major muscle is retracted medially and completely preserved. The right pleural space is entered through the 3rd intercostal space. A soft tissue retractor is placed.  Two 11 mm ports are placed through separate stab incisions inferiorly. The right pleural space is insufflated continuously with carbon dioxide gas through the posterior port during the remainder of the operation.  A pledgeted sutures placed through the dome of the right hemidiaphragm and retracted inferiorly to facilitate exposure.  A longitudinal incision is made in the pericardium 3 cm anterior to the phrenic nerve and silk traction sutures are placed on either side of the incision for exposure.   Extracorporeal Cardiopulmonary Bypass and Myocardial Protection:   The patient was heparinized systemically.  The right common femoral  vein is cannulated through the venous sheath  and a guidewire advanced into the right atrium using TEE guidance.  The femoral vein cannulated using a 22 Fr long femoral venous cannula.  The right common femoral artery is cannulated through the arterial sheath and a guidewire advanced into the descending thoracic aorta using TEE guidance.  Femoral artery is cannulated with a 18 French femoral arterial cannula.  The right internal jugular vein is cannulated through the venous sheath and a guidewire advanced into the right atrium.  The internal jugular vein is cannulated using a 14 French pediatric femoral venous cannula.   Adequate heparinization is verified.  The entire pre-bypass portion of the operation was notable for stable hemodynamics.  Cardiopulmonary bypass was begun.  Vacuum assist venous drainage is utilized. The incision in the pericardium is extended in both directions. Venous drainage and exposure are notably excellent.  An antegrade cardioplegia cannula is placed in the ascending aorta.    The patient is cooled to 32C systemic temperature.  The aortic cross clamp is applied and cardioplegia is delivered initially in an antegrade fashion through the aortic root using modified [place] cold blood cardioplegia ([place] blood cardioplegia protocol).   The initial cardioplegic arrest is rapid with early diastolic arrest.   Myocardial protection was felt to be excellent.   Mitral Valve Replacement:  A left atriotomy incision was performed through the interatrial groove and extended partially across the back wall of the left atrium after opening the oblique sinus inferiorly.  The mitral valve is exposed using a self-retaining retractor.  The mitral valve was inspected and notable for classical rheumatic disease.  There was severe thickening and scarring involving both the anterior and posterior leaflets of the mitral valve.  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  There was  thickening, foreshortening, and fusion of the subvalvular apparatus.  The anterior leaflet of the mitral valve was excised sharply, leaving a small rim of the free margin associated with some of the primary chords to each papillary muscle.  The posterior leaflet split in the midline and debulked.  The mitral annulus was sized to accept a 29 mm prosthesis.  The left ventricle was irrigated with copious cold saline solution.  Mitral valve replacement was performed using interrupted horizontal mattress 2-0 Ethibond pledgeted sutures with pledgets in the supraannular position.  The remaining portions of the anterior leaflet were incorporated into the suture line laterally, thereby preserving chords to both the anterior and posterior leaflet.  A Sorin Carbomedics bileaflet mechanical prosthetic valve (size 29 mm, catalog # [id number], serial # [id number]) was implanted uneventfully. The valve was carefully inspected to make sure that the 2 leaflets open and closed normally without any tendency to become impinged by any of the subvalvular apparatus.  Rewarming is begun.   Procedure Completion:  The atriotomy was closed using a 2-layer closure of running 3-0 Prolene suture after placing a sump drain across the mitral valve to serve as a left ventricular vent.  One final dose of warm "reanimation dose" cardioplegia was administered through the aortic root.  The aortic cross clamp was removed after a total cross clamp time of 101 minutes.  Epicardial pacing wires are fixed to the inferior wall of the right ventricule and to the right atrial appendage. The patient is rewarmed to 37C temperature. The left ventricular vent and antegrade cardioplegia cannula are removed. The patient is weaned and disconnected from cardiopulmonary bypass.  The patient's rhythm at separation from bypass was AV paced.  The patient was weaned from bypass  without any inotropic support. Total cardiopulmonary bypass time for the operation was  146 minutes.  Followup transesophageal echocardiogram performed after separation from bypass revealed a well-seated mechanical prosthetic valve in the mitral position that was functioning normally.  There was no paravalvular leak.  2 small intra-valvular jets expected at each commissure were appreciated.  The mean gradient across the mitral valve was estimated to be 5 mmHg.  The femoral arterial and venous cannulas were removed and all Perclose sutures secured.  Manual pressure was maintained while Protamine was administered.  The right internal jugular cannula was removed and manual pressure held on the neck for 15 minutes.  Single lung ventilation was begun. The atriotomy closure was inspected for hemostasis. The pericardial sac was drained using a 28 French Bard drain placed through the anterior port incision.  The right pleural space is irrigated with saline solution and inspected for hemostasis.   The On-Q pain management system is utilized for postoperative analgesia.  A single lumen catheter is passed through the subcutaneous tissues from the anterior chest wall to the posterior port incision.  The catheter was then passed through the port incision into the pleural space and tunneled into the subpleural space posteriorly to cover the second through the sixth intercostal nerve roots.  The catheter was flushed with 0.5% bupivacaine solution and ultimately connected to a continuous infusion pump.  The right pleural space was drained using a 28 French Bard drain placed through the posterior port incision. The miniature thoracotomy incision was closed in multiple layers in routine fashion. The right groin incision was inspected for hemostasis and closed in multiple layers in routine fashion.  The post-bypass portion of the operation was notable for stable rhythm and hemodynamics.  No blood products were administered during the operation.   Disposition:  The patient tolerated the procedure well.   The patient was extubated in the operating room and subsequently transported to the surgical intensive care unit in stable condition. There were no intraoperative complications. All sponge instrument and needle counts are verified correct at completion of the operation.     Valentina Gu. Roxy Manns MD 05/14/2019 1:00 PM

## 2019-05-14 NOTE — Anesthesia Procedure Notes (Signed)
Arterial Line Insertion Start/End7/12/2018 6:55 AM, 05/14/2019 6:55 AM Performed by: Clearnce Sorrel, CRNA  Patient location: Pre-op. Preanesthetic checklist: patient identified, IV checked, site marked, risks and benefits discussed, surgical consent, monitors and equipment checked, pre-op evaluation, timeout performed and anesthesia consent Lidocaine 1% used for infiltration Left, radial was placed Catheter size: 20 Fr Hand hygiene performed  and maximum sterile barriers used   Attempts: 1 Procedure performed without using ultrasound guided technique. Following insertion, dressing applied. Post procedure assessment: normal and unchanged

## 2019-05-15 ENCOUNTER — Inpatient Hospital Stay (HOSPITAL_COMMUNITY): Payer: BC Managed Care – PPO

## 2019-05-15 LAB — CBC
HCT: 30.9 % — ABNORMAL LOW (ref 39.0–52.0)
HCT: 30.2 % — ABNORMAL LOW (ref 39.0–52.0)
Hemoglobin: 10.7 g/dL — ABNORMAL LOW (ref 13.0–17.0)
Hemoglobin: 10.1 g/dL — ABNORMAL LOW (ref 13.0–17.0)
MCH: 32.5 pg (ref 26.0–34.0)
MCH: 31.9 pg (ref 26.0–34.0)
MCHC: 34.6 g/dL (ref 30.0–36.0)
MCHC: 33.4 g/dL (ref 30.0–36.0)
MCV: 93.9 fL (ref 80.0–100.0)
MCV: 95.3 fL (ref 80.0–100.0)
Platelets: 201 10*3/uL (ref 150–400)
Platelets: 150 10*3/uL (ref 150–400)
RBC: 3.29 MIL/uL — ABNORMAL LOW (ref 4.22–5.81)
RBC: 3.17 MIL/uL — ABNORMAL LOW (ref 4.22–5.81)
RDW: 13.1 % (ref 11.5–15.5)
RDW: 13.2 % (ref 11.5–15.5)
WBC: 14.2 10*3/uL — ABNORMAL HIGH (ref 4.0–10.5)
WBC: 9.4 10*3/uL (ref 4.0–10.5)
nRBC: 0 % (ref 0.0–0.2)
nRBC: 0 % (ref 0.0–0.2)

## 2019-05-15 LAB — BASIC METABOLIC PANEL
Anion gap: 6 (ref 5–15)
Anion gap: 11 (ref 5–15)
BUN: 12 mg/dL (ref 6–20)
BUN: 15 mg/dL (ref 6–20)
CO2: 23 mmol/L (ref 22–32)
CO2: 24 mmol/L (ref 22–32)
Calcium: 8.1 mg/dL — ABNORMAL LOW (ref 8.9–10.3)
Calcium: 8.2 mg/dL — ABNORMAL LOW (ref 8.9–10.3)
Chloride: 105 mmol/L (ref 98–111)
Chloride: 100 mmol/L (ref 98–111)
Creatinine, Ser: 0.83 mg/dL (ref 0.61–1.24)
Creatinine, Ser: 1.21 mg/dL (ref 0.61–1.24)
GFR calc Af Amer: >60 mL/min (ref >60)
GFR calc Af Amer: >60 mL/min (ref >60)
GFR calc non Af Amer: >60 mL/min (ref >60)
GFR calc non Af Amer: >60 mL/min (ref >60)
Glucose, Bld: 115 mg/dL — ABNORMAL HIGH (ref 70–99)
Glucose, Bld: 116 mg/dL — ABNORMAL HIGH (ref 70–99)
Potassium: 4.5 mmol/L (ref 3.5–5.1)
Potassium: 3.7 mmol/L (ref 3.5–5.1)
Sodium: 134 mmol/L — ABNORMAL LOW (ref 135–145)
Sodium: 135 mmol/L (ref 135–145)

## 2019-05-15 LAB — MAGNESIUM
Magnesium: 2.4 mg/dL (ref 1.7–2.4)
Magnesium: 2.2 mg/dL (ref 1.7–2.4)

## 2019-05-15 LAB — GLUCOSE, CAPILLARY
Glucose-Capillary: 102 mg/dL — ABNORMAL HIGH (ref 70–99)
Glucose-Capillary: 108 mg/dL — ABNORMAL HIGH (ref 70–99)
Glucose-Capillary: 115 mg/dL — ABNORMAL HIGH (ref 70–99)
Glucose-Capillary: 119 mg/dL — ABNORMAL HIGH (ref 70–99)
Glucose-Capillary: 120 mg/dL — ABNORMAL HIGH (ref 70–99)
Glucose-Capillary: 124 mg/dL — ABNORMAL HIGH (ref 70–99)
Glucose-Capillary: 124 mg/dL — ABNORMAL HIGH (ref 70–99)
Glucose-Capillary: 131 mg/dL — ABNORMAL HIGH (ref 70–99)
Glucose-Capillary: 88 mg/dL (ref 70–99)
Glucose-Capillary: 95 mg/dL (ref 70–99)

## 2019-05-15 MED ORDER — MELATONIN 5 MG PO TABS: 5.0000 mg | ORAL_TABLET | Freq: Every evening | ORAL | Status: DC | PRN

## 2019-05-15 MED ORDER — AMIODARONE LOAD VIA INFUSION
150.0000 mg | Freq: Once | INTRAVENOUS | Status: DC
  Filled 2019-05-15: qty 83.34

## 2019-05-15 MED ORDER — INSULIN ASPART 100 UNIT/ML ~~LOC~~ SOLN: 0.0000 [IU] | SUBCUTANEOUS | Status: DC

## 2019-05-15 MED ORDER — AMIODARONE HCL 200 MG PO TABS
200.0000 mg | ORAL_TABLET | Freq: Two times a day (BID) | ORAL | Status: DC
  Administered 2019-05-15 – 2019-05-17 (×6): 200 mg via ORAL
  Filled 2019-05-15 (×6): qty 1

## 2019-05-15 MED ORDER — TAMSULOSIN HCL 0.4 MG PO CAPS
0.4000 mg | ORAL_CAPSULE | Freq: Every evening | ORAL | Status: DC
  Administered 2019-05-15 – 2019-05-18 (×4): 0.4 mg via ORAL
  Filled 2019-05-15 (×4): qty 1

## 2019-05-15 MED ORDER — KETOROLAC TROMETHAMINE 15 MG/ML IJ SOLN
15.0000 mg | Freq: Four times a day (QID) | INTRAMUSCULAR | Status: DC | PRN
  Administered 2019-05-15 – 2019-05-19 (×4): 15 mg via INTRAVENOUS
  Filled 2019-05-15 (×4): qty 1

## 2019-05-15 MED ORDER — NORTRIPTYLINE HCL 25 MG PO CAPS
25.0000 mg | ORAL_CAPSULE | Freq: Every day | ORAL | Status: DC
  Administered 2019-05-15 – 2019-05-18 (×4): 25 mg via ORAL
  Filled 2019-05-15 (×6): qty 1

## 2019-05-15 MED ORDER — WARFARIN SODIUM 5 MG PO TABS
5.0000 mg | ORAL_TABLET | Freq: Every day | ORAL | Status: DC
  Administered 2019-05-15 – 2019-05-17 (×3): 5 mg via ORAL
  Filled 2019-05-15 (×3): qty 1

## 2019-05-15 MED ORDER — AMIODARONE HCL IN DEXTROSE 360-4.14 MG/200ML-% IV SOLN: 30.0000 mg/h | INTRAVENOUS | Status: DC

## 2019-05-15 MED ORDER — MELATONIN 3 MG PO TABS
6.0000 mg | ORAL_TABLET | Freq: Every evening | ORAL | Status: DC | PRN
  Administered 2019-05-15: 6 mg via ORAL
  Filled 2019-05-15 (×2): qty 2

## 2019-05-15 MED ORDER — GABAPENTIN 300 MG PO CAPS
600.0000 mg | ORAL_CAPSULE | Freq: Three times a day (TID) | ORAL | Status: DC
  Administered 2019-05-15 – 2019-05-19 (×12): 600 mg via ORAL
  Filled 2019-05-15 (×13): qty 2

## 2019-05-15 MED ORDER — AMIODARONE HCL IN DEXTROSE 360-4.14 MG/200ML-% IV SOLN: 60.0000 mg/h | INTRAVENOUS | Status: DC

## 2019-05-15 MED ORDER — ASPIRIN EC 325 MG PO TBEC
325.0000 mg | DELAYED_RELEASE_TABLET | Freq: Every day | ORAL | Status: AC
  Administered 2019-05-15: 325 mg via ORAL
  Filled 2019-05-15: qty 1

## 2019-05-15 MED ORDER — INSULIN ASPART 100 UNIT/ML ~~LOC~~ SOLN: 0.0000 [IU] | Freq: Every day | SUBCUTANEOUS | Status: DC

## 2019-05-15 MED ORDER — POTASSIUM CHLORIDE CRYS ER 20 MEQ PO TBCR
20.0000 meq | EXTENDED_RELEASE_TABLET | ORAL | Status: AC
  Administered 2019-05-15 – 2019-05-16 (×3): 20 meq via ORAL
  Filled 2019-05-15 (×3): qty 1

## 2019-05-15 MED ORDER — CLONAZEPAM 0.5 MG PO TABS
0.5000 mg | ORAL_TABLET | Freq: Every day | ORAL | Status: DC
  Administered 2019-05-15 – 2019-05-18 (×4): 0.5 mg via ORAL
  Filled 2019-05-15 (×4): qty 1

## 2019-05-15 MED ORDER — ENOXAPARIN SODIUM 40 MG/0.4ML ~~LOC~~ SOLN
40.0000 mg | Freq: Every day | SUBCUTANEOUS | Status: DC
  Administered 2019-05-16 – 2019-05-17 (×2): 40 mg via SUBCUTANEOUS
  Filled 2019-05-15 (×2): qty 0.4

## 2019-05-15 MED ORDER — WARFARIN - PHYSICIAN DOSING INPATIENT
Freq: Every day | Status: DC
  Administered 2019-05-17: 18:00:00

## 2019-05-15 MED ORDER — ASPIRIN EC 81 MG PO TBEC
81.0000 mg | DELAYED_RELEASE_TABLET | Freq: Every day | ORAL | Status: DC
  Administered 2019-05-16 – 2019-05-19 (×4): 81 mg via ORAL
  Filled 2019-05-15 (×4): qty 1

## 2019-05-15 MED ORDER — SERTRALINE HCL 100 MG PO TABS
100.0000 mg | ORAL_TABLET | Freq: Every day | ORAL | Status: DC
  Administered 2019-05-15 – 2019-05-18 (×4): 100 mg via ORAL
  Filled 2019-05-15 (×4): qty 1

## 2019-05-15 MED ORDER — INSULIN ASPART 100 UNIT/ML ~~LOC~~ SOLN: 0.0000 [IU] | Freq: Three times a day (TID) | SUBCUTANEOUS | Status: DC

## 2019-05-15 MED ORDER — FUROSEMIDE 10 MG/ML IJ SOLN
20.0000 mg | Freq: Two times a day (BID) | INTRAMUSCULAR | Status: AC
  Administered 2019-05-15 – 2019-05-16 (×3): 20 mg via INTRAVENOUS
  Filled 2019-05-15 (×3): qty 2

## 2019-05-15 MED ORDER — BUPRENORPHINE HCL 750 MCG BU FILM
750.0000 ug | ORAL_FILM | Freq: Two times a day (BID) | BUCCAL | Status: DC
  Administered 2019-05-15 – 2019-05-19 (×8): 750 ug via BUCCAL
  Filled 2019-05-15: qty 1

## 2019-05-15 NOTE — Plan of Care (Signed)
  Problem: Cardiac: Goal: Will achieve and/or maintain hemodynamic stability Outcome: Progressing   Problem: Clinical Measurements: Goal: Postoperative complications will be avoided or minimized Outcome: Progressing   Problem: Respiratory: Goal: Respiratory status will improve Outcome: Progressing   Problem: Skin Integrity: Goal: Wound healing without signs and symptoms of infection Outcome: Progressing   Problem: Clinical Measurements: Goal: Ability to maintain clinical measurements within normal limits will improve Outcome: Progressing Goal: Will remain free from infection Outcome: Progressing   Problem: Nutrition: Goal: Adequate nutrition will be maintained Outcome: Progressing

## 2019-05-15 NOTE — Progress Notes (Signed)
      [address]       [place] [address]             [phone]      C/o pain and receiving less narcotic than he takes at baseline  BP (!) 125/57 (BP Location: Right Arm)   Pulse 92   Temp 99.3 F (37.4 C) (Oral)   Resp (!) 22   Ht 6\' 2"  (1.88 m)   Wt 100.2 kg   SpO2 96%   BMI 28.36 kg/m   Intake/Output Summary (Last 24 hours) at 05/15/2019 1708 Last data filed at 05/15/2019 1600 Gross per 24 hour  Intake 2754.69 ml  Output 3990 ml  Net -1235.31 ml   CBG well controlled PM labs pending  Home buprenorphine, gabapentin, sertraline and nortriptyline reordered  Remo Lipps C. Roxan Hockey, MD [facility] [phone]

## 2019-05-15 NOTE — Discharge Summary (Signed)
Physician Discharge Summary  Patient ID: Darren Haynes MRN: [id number] DOB/AGE: 19-Mar-1986 33 y.o.  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  Patient denies any known history of rheumatic fever during childhood although he does report that he apparently had several cases of Streptococcus. He reports a progressive history of symptoms of shortness of breath for more than a year. He was evaluated by multiple physicians and told there was nothing wrong with his heart. Eventually he developed more severe shortness of breath and a chest x-ray revealed mild pulmonary edema. He was evaluated by a 41 assistant at [facility] and referred to Dr. Claudie Leach. Transthoracic echocardiogram revealed normal left ventricular systolic function with severe mitral stenosis and signs of pulmonary hypertension. The patient was subsequently referred to Dr. Gwenlyn Found who initially evaluated the patient in January of this year. TEE performed December 11, 2018 revealed findings consistent with rheumatic mitral stenosis. Mean transvalvular gradient was estimated 19 mmHg. Valve area by pressure half-time was 0.71 cm. Using planimetry the valve area was 0.96 cm. By the continuity equation it measured 0.61 cm and  using PISAit measured only 0.55 cm. There was only mild mitral regurgitation in the valvuloplasty score was 5. Left and right heart catheterization was performed by Dr. Gwenlyn Found.There was normal coronary artery anatomy with no significant coronary artery disease. Mean transvalvular gradient across mitral valve was estimated 16.1 mmHg. There was moderate pulmonary hypertension with normal central venous pressure and cardiac output. The indexed mitral valve area was 0.56 cm/m.   The patient was referred to Dr. Mina Marble at [facility] and underwent balloon mitral valvuloplasty on December 24, 2018. Although the procedure was technically successful, the patient was told by Dr. Mina Marble that the improvement was less than optimal and long-term prognosis questionable. The patient enjoyed immediate improvement in his respiratory status, but within several weeks he began to develop worsening shortness of breath. Symptoms have continued to progress over the past 3 months, and the patient states that symptoms are almost as bad as they were prior to his valvuloplasty in February. He now gets short of breath with low-level activity and occasionally at rest. He has tightness across his chest that comes and goes but is essentially present most of the time. He cannot do much of any physical activity without having to stop to catch his breath. He cannot lay flat in bed. He has palpitations. He has had some mild lower extremity edema. He reports occasional dizzy spells without syncope. He has had increased symptoms of palpitations and previous long-term event monitor reveals sinus rhythm with sinus bradycardia, sinus tachycardia, PVCs, PACs, and occasional runs of supraventricular tachycardia and nonsustained ventricular tachycardia. He has not had any documented atrial fibrillation nor any other sustained arrhythmias. The patient recently underwent follow-up transthoracic and transesophageal  echocardiograms which revealed some decline in left ventricular systolic function with ejection fraction estimated only 45 to 50%. There was at  least moderate and probably severe persistent mitral stenosis. There was mild mitral regurgitation. Cardiothoracic surgical consultation was requested.  Patient is married and lives with his wife in [place]. He previously worked in [employer] but he has no disabled because of recently diagnosed multiple sclerosis. He suffers from chronic pain and numbness in both lower legs as well as occasional jerking movements consistent with mild clonus. He has been followed at [facility] and recently referred to a multiple sclerosis specialist to consider medical therapy. He previously had been treated with steroids for joint pain attributed to rheumatoid arthritis, although he has been advised to stay off of steroids for the last few months and is now taking methotrexate. His mobility is moderately limited to the ambulates using a cane. He has had a few mechanical falls without significant injury.  Patient is a 33 year old male with rheumatic mitral stenosis, chronic diastolic congestive heart failure, pulmonary hypertension, rheumatoid arthritis, and possible multiple sclerosis who returns the office today with tentative plans to proceed with mitral valve replacement later this week. Patient was originally seen in consultation on April 20, 2019. On April 22, 2019 he was evaluated by Dr. Ronalee Red in the neurology clinic at [facility] who felt that previous neuro imaging studies were not consistent with multiple sclerosis. He underwent lumbar puncture to evaluate CSF and follow-up work-up remains in progress.He returns to the office today for follow-up prior to surgery. He reports no new problems or complaints. He specifically denies any recent fever, productive cough, or other symptoms to suggest the  development of COVID-19 infection. He has been practicing social distancing and he has not been exposed to any persons with known or suspected COVID-19 infection. He has not been taking methotrexate for the past 2 weeks. The remainder of his review of systems is unchanged from previously.  Hospital Course:   Mr. Baltzell was admitted for same-day surgery and taken to the OR where mini mitral valve replacement was carried out using a Sorin [employer] Bileaflet Mechanical Valve (size 29 mm). The surgery was without complication and he separated from cardiopulmonary bypass without any inotropic agents being required. Following the procedure, he was transferred to the CVICU. He was extubated routinely and his respiratory status remained stable. He had a brief episode of atrial fibrillation overnight after surgery the converted without any intervention. He was started on oral amiodarone on post-op day 1. Lines were removed on POD1 and he was mobilized. We discontinued his foley cather. We added Toradol for pain control. He did have post-op afib and was started on oral Amio. On coumadin daily for a mechanical valve. Keep chest tube in place but switched to water seal. He was stable for transfer to the stepdown unit. POD 3 he was placed back on his home pain medication for better pain control. INR continued to increase. We removed his chest tubes and epicardial pacing wires. We removed his On-Q since there was no longer any medication in the bulb. He was encouraged to ambulate several times a day and continued to use his IS. Lovenox was discontinued on POD 4 since he was therapeutic on his coumadin. He has another brief episode of atrial fibrillation but now was in 2nd degree AVB. Amio was discontinued and EP was consulted.  Today, he is ambulating with limited assistance, tolerating room air, his incisions were healing well and he was ready for discharge home. His chest tube sutures will be removed during his  follow-up appointment which  is on 7/20.   Consults: cardiology  Significant Diagnostic Studies:  CLINICAL DATA:  33 year old male with a history of mitral valve replacement  EXAM: CHEST - 2 VIEW  COMPARISON:  Multiple prior, 05/17/2019, 05/16/2019, 05/15/2019, 05/14/2019  FINDINGS: Cardiomediastinal silhouette unchanged in size and contour. Surgical changes of minimally invasive mitral valve repair.  Interval removal of right-sided thoracostomy tubes, epicardial pacing leads, mediastinal drains.  No pneumothorax.  Low lung volumes with linear opacities at the lung bases. Similar appearance of coarsened interstitial markings.  Meniscus on the lateral view.  IMPRESSION: Interval removal of thoracostomy tubes/pleural drains and the epicardial pacing leads with no pneumothorax.  Trace pleural effusions and basilar atelectasis.  Surgical changes of minimally invasive mitral valve repair.   Electronically Signed   By: Gilmer MorJaime  Wagner D.O.   On: 05/19/2019 08:14   Treatments: Surgery CARDIOTHORACIC SURGERY OPERATIVE NOTE  Date of Procedure:                05/14/2019  Preoperative Diagnosis:        Rheumatic Heart Disease  Severe Mitral Stenosis  Postoperative Diagnosis:    Same  Procedure:        Minimally-Invasive Mitral Valve Replacement             Sorin [employer]omed[employer] Bileaflet Mechanical Valve (size 29 mm, catalog # [id number], serial # [id number])               Surgeon:        Salvatore Decentlarence H. Cornelius Moraswen, MD  Assistant:       B. Lorayne MarekZane Atkins, MD  Anesthesia:    Gaynelle AduWilliam Fitzgerald, MD  Operative Findings: ? Rheumatic mitral valve disease with severe mitral stenosis ? Normal left ventricular systolic function ? Normal right ventricular size and systolic function ? No tricuspid regurgitation          Discharge Exam: Blood pressure (!) 117/55, pulse 94, temperature 98 F (36.7 C), temperature source Oral, resp. rate 19, height 6\' 2"   (1.88 m), weight 95.6 kg, SpO2 100 %.   General appearance: alert, cooperative and no distress Heart: regular rate and rhythm, S1, S2 normal, no murmur, click, rub or gallop Lungs: clear to auscultation bilaterally Abdomen: soft, non-tender; bowel sounds normal; no masses,  no organomegaly Extremities: extremities normal, atraumatic, no cyanosis or edema Wound: clean and dry   Disposition: Discharge disposition: 01-Home or Self Care       Discharge Instructions    Amb Referral to Cardiac Rehabilitation   Complete by: As directed    Diagnosis: Valve Replacement   Valve: Mitral   After initial evaluation and assessments completed: Virtual Based Care may be provided alone or in conjunction with Phase 2 Cardiac Rehab based on patient barriers.: Yes     Allergies as of 05/19/2019   No Known Allergies     Medication List    STOP taking these medications   etodolac 400 MG 24 hr tablet Commonly known as: LODINE XL   furosemide 40 MG tablet Commonly known as: LASIX   methotrexate 2.5 MG tablet   metoprolol succinate 50 MG 24 hr tablet Commonly known as: Toprol XL   potassium chloride SA 20 MEQ tablet Commonly known as: K-DUR     TAKE these medications   acetaminophen 500 MG tablet Commonly known as: TYLENOL Take 2 tablets (1,000 mg total) by mouth every 6 (six) hours.   ALPRAZolam 1 MG dissolvable tablet Commonly known as: NIRAVAM Take 0.5 mg by mouth 3 (three) times daily as  needed for anxiety.   aspirin EC 81 MG tablet Take 81 mg by mouth daily.   Belbuca 750 MCG Film Generic drug: Buprenorphine HCl Place 750 mcg inside cheek 2 (two) times daily.   busPIRone 7.5 MG tablet Commonly known as: BUSPAR Take 7.5 mg by mouth 3 (three) times daily.   clonazePAM 0.5 MG tablet Commonly known as: KLONOPIN Take 0.5 mg by mouth at bedtime.   famotidine 20 MG tablet Commonly known as: PEPCID Take 20 mg by mouth 2 (two) times daily as needed for heartburn or  indigestion.   fluticasone-salmeterol 45-21 MCG/ACT inhaler Commonly known as: ADVAIR HFA Inhale 2 puffs into the lungs 2 (two) times daily.   folic acid 1 MG tablet Commonly known as: FOLVITE Take 1 mg by mouth daily.   gabapentin 300 MG capsule Commonly known as: NEURONTIN Take 600 mg by mouth 3 (three) times daily.   MELATONIN PO Take 1 tablet by mouth at bedtime as needed (sleep).   nortriptyline 25 MG capsule Commonly known as: PAMELOR Take 25 mg by mouth at bedtime.   oxyCODONE-acetaminophen 7.5-325 MG tablet Commonly known as: PERCOCET Take 1 tablet by mouth every 4 (four) hours as needed for severe pain.   penicillin v potassium 250 MG tablet Commonly known as: VEETID Take 250 mg by mouth 2 (two) times a day.   sertraline 100 MG tablet Commonly known as: ZOLOFT Take 100 mg by mouth at bedtime.   tamsulosin 0.4 MG Caps capsule Commonly known as: FLOMAX Take 0.4 mg by mouth every evening.   tiZANidine 4 MG tablet Commonly known as: ZANAFLEX Take 4 mg by mouth 3 (three) times daily.   Vitamin D (Ergocalciferol) 1.25 MG (50000 UT) Caps capsule Commonly known as: DRISDOL Take 50,000 Units by mouth every Tuesday.   warfarin 2.5 MG tablet Commonly known as: COUMADIN Take 1 tablet (2.5 mg total) by mouth daily at 6 PM.     The patient has been discharged on:   1.Beta Blocker:  Yes [   ]                              No   [  no ]                              If No, reason: heart block  2.Ace Inhibitor/ARB: Yes [   ]                                     No  [ no   ]                                     If No, reason:normotensive  3.Statin:   Yes [   ]                  No  [ no  ]                  If No, reason: no coronary disease  4.Marlowe KaysEcasaValentino Hue:  Yes  [ yes  ]                  No   [   ]  If No, reason:   Follow-up Information    [facility] [employer]. Go on 05/22/2019.   Specialty: Cardiology Why: You have an appointment with  the [facility] on Friday 05/22/19 at 10:30 am.  Contact information: [address], [address] [place] [address] [phone]       Azalee Course, [place] Follow up.   Specialties: Cardiology, Radiology Why: You have a follow up cardiology appointment with Azalee Course, PA-C on Monday  06/01/19 at 2pm. Contact information: [address] [place] [address] [phone]        Purcell Nails, MD. Go on 06/01/2019.   Specialty: Cardiothoracic Surgery Why: You have an appointment with Dr. Cornelius Moras on Monday 06/01/19 at 11:00am.  Please arrive early for a chest x-ray to be done at [facility] located on the 1st floor of the same building.  Contact information: [address] [place] [address] [phone]           Signed: Sharlene Dory 05/19/2019, 9:09 AM

## 2019-05-15 NOTE — Progress Notes (Addendum)
1 Day Post-Op Procedure(s) (LRB): MINIMALLY INVASIVE MITRAL VALVE (MV) REPLACEMENT USING CARBOMEDICS OPTIFORM Size 76mm (Right) TRANSESOPHAGEAL ECHOCARDIOGRAM (TEE) (N/A) Subjective: Awake and alert, mental status appropriate.  Stable VS and hemodynamics since surgery.  Currently on NTG ffor HTN.   Objective: Vital signs in last 24 hours: Temp:  [95.4 F (35.2 C)-100.8 F (38.2 C)] 100 F (37.8 C) (07/03 0600) Pulse Rate:  [80-120] 96 (07/03 0600) Cardiac Rhythm: Normal sinus rhythm (07/03 0400) Resp:  [10-38] 35 (07/03 0600) BP: (96-129)/(59-81) 111/59 (07/03 0400) SpO2:  [92 %-100 %] 95 % (07/03 0600) Arterial Line BP: (111-169)/(53-76) 161/71 (07/03 0600) Weight:  [100.2 kg] 100.2 kg (07/03 0600)  Hemodynamic parameters for last 24 hours: PAP: (32-57)/(11-31) 44/18 CO:  [5.7 L/min-8.1 L/min] 7.6 L/min CI:  [2.6 L/min/m2-3.7 L/min/m2] 3.4 L/min/m2  Intake/Output from previous day: 07/02 0701 - 07/03 0700 In: 5755.3 [P.O.:480; I.V.:4036.3; Blood:200; IV Piggyback:1039] Out: 3614 [Urine:3835; Blood:400; Chest Tube:920] Intake/Output this shift: No intake/output data recorded.  General appearance: alert, cooperative and mild distress Neurologic: intact Heart: Brief episode of afib last night converted to NSR without any treatment.  Lungs: clear to auscultation bilaterally Abdomen: soft, non-tender, rare bowel sounds.  Extremities: all wellperfused, no edema. Wound: the right chest incision is covered with an Aquacel dresing.   Lab Results: Recent Labs    05/14/19 1856 05/15/19 0417  WBC 10.6* 14.2*  HGB 10.8* 10.7*  HCT 31.0* 30.9*  PLT 142* 201   BMET:  Recent Labs    05/14/19 1856 05/15/19 0417  NA 137 134*  K 3.9 4.5  CL 107 105  CO2 21* 23  GLUCOSE 122* 115*  BUN 14 12  CREATININE 0.92 0.83  CALCIUM 7.8* 8.1*    PT/INR:  Recent Labs    05/14/19 1345  LABPROT 17.8*  INR 1.5*   ABG    Component Value Date/Time   PHART 7.298 (L) 05/14/2019  1507   HCO3 24.6 05/14/2019 1507   TCO2 26 05/14/2019 1507   ACIDBASEDEF 2.0 05/14/2019 1507   O2SAT 99.0 05/14/2019 1507   CBG (last 3)  Recent Labs    05/15/19 0325 05/15/19 0423 05/15/19 0527  GLUCAP 131* 119* 115*    Assessment/Plan: S/P Procedure(s) (LRB): MINIMALLY INVASIVE MITRAL VALVE (MV) REPLACEMENT USING CARBOMEDICS OPTIFORM Size 56mm (Right) TRANSESOPHAGEAL ECHOCARDIOGRAM (TEE) (N/A)  -POD1 Mini mitral valve replacement for severe rheumatic valvular disease, symptomatic mitral stenosis. Stable hemodynamics. Will remove PA catheter and arterial line, D/C foley catheter, mobilize. Initiate anticoagulation with Coumadin. Toradol for pain control over next 24 hours.   -Post-op a-fib-brief episode that converted without intervention. Will add oral amiodarone 200mg  BID. K+ and Mg+ are WNL.  -DVT PPX-  Continue SCD's, Add Lovenox tomorrow.    LOS: 1 day    Antony Odea, PA-C [phone] 05/15/2019  I have seen and examined the patient and agree with the assessment and plan as outlined.  Doing well POD1.  Very brief episode PAF overnight.  Will start oral amiodarone.  Mobilize.  D/C lines.  Start Coumadin.  Possible transfer 4E later today or tomorrow.  Rexene Alberts, MD 05/15/2019 8:49 AM

## 2019-05-15 NOTE — Progress Notes (Signed)
Patient supplied med obtained from wife and checked-in with pharmacy who will dispense it in the [facility] pyxis B.  Paperwork placed in shadow chart.

## 2019-05-15 NOTE — Progress Notes (Signed)
Patient's heart rhythm returned to sinus rhythm before amiodarone drip started. Will continue to monitor patient's heart rhythm.

## 2019-05-15 NOTE — Progress Notes (Signed)
Patient's heart rhythm changed from sinus rhythm to atrial fibrillation. Heart rate in the mid 110s. Patient asymptomatic. Contacted Melodie Bouillon, MD for orders. Orders received to begin amiodarone drip.

## 2019-05-16 ENCOUNTER — Inpatient Hospital Stay (HOSPITAL_COMMUNITY): Payer: BC Managed Care – PPO

## 2019-05-16 LAB — BASIC METABOLIC PANEL WITH GFR
Anion gap: 7 (ref 5–15)
BUN: 14 mg/dL (ref 6–20)
CO2: 27 mmol/L (ref 22–32)
Calcium: 8.4 mg/dL — ABNORMAL LOW (ref 8.9–10.3)
Chloride: 102 mmol/L (ref 98–111)
Creatinine, Ser: 1.21 mg/dL (ref 0.61–1.24)
GFR calc Af Amer: >60 mL/min
GFR calc non Af Amer: >60 mL/min
Glucose, Bld: 111 mg/dL — ABNORMAL HIGH (ref 70–99)
Potassium: 4.1 mmol/L (ref 3.5–5.1)
Sodium: 136 mmol/L (ref 135–145)

## 2019-05-16 LAB — GLUCOSE, CAPILLARY
Glucose-Capillary: 100 mg/dL — ABNORMAL HIGH (ref 70–99)
Glucose-Capillary: 123 mg/dL — ABNORMAL HIGH (ref 70–99)
Glucose-Capillary: 120 mg/dL — ABNORMAL HIGH (ref 70–99)

## 2019-05-16 LAB — CBC
HCT: 29.6 % — ABNORMAL LOW (ref 39.0–52.0)
Hemoglobin: 9.9 g/dL — ABNORMAL LOW (ref 13.0–17.0)
MCH: 32 pg (ref 26.0–34.0)
MCHC: 33.4 g/dL (ref 30.0–36.0)
MCV: 95.8 fL (ref 80.0–100.0)
Platelets: 144 10*3/uL — ABNORMAL LOW (ref 150–400)
RBC: 3.09 MIL/uL — ABNORMAL LOW (ref 4.22–5.81)
RDW: 13.2 % (ref 11.5–15.5)
WBC: 9.1 10*3/uL (ref 4.0–10.5)
nRBC: 0 % (ref 0.0–0.2)

## 2019-05-16 LAB — PROTIME-INR
INR: 1.2 (ref 0.8–1.2)
Prothrombin Time: 15.2 seconds (ref 11.4–15.2)

## 2019-05-16 MED ORDER — MAGNESIUM HYDROXIDE 400 MG/5ML PO SUSP: 30.0000 mL | Freq: Every day | ORAL | Status: DC | PRN

## 2019-05-16 MED ORDER — ZOLPIDEM TARTRATE 5 MG PO TABS: 5.0000 mg | ORAL_TABLET | Freq: Every evening | ORAL | Status: DC | PRN

## 2019-05-16 MED ORDER — MOVING RIGHT ALONG BOOK
Freq: Once | Status: DC
  Filled 2019-05-16: qty 1

## 2019-05-16 MED ORDER — POTASSIUM CHLORIDE ER 10 MEQ PO TBCR
20.0000 meq | EXTENDED_RELEASE_TABLET | Freq: Every day | ORAL | Status: DC
  Administered 2019-05-16 – 2019-05-17 (×2): 20 meq via ORAL
  Filled 2019-05-16 (×5): qty 2

## 2019-05-16 MED ORDER — SODIUM CHLORIDE 0.9 % IV SOLN: 250.0000 mL | INTRAVENOUS | Status: DC | PRN

## 2019-05-16 MED ORDER — ALUM & MAG HYDROXIDE-SIMETH 200-200-20 MG/5ML PO SUSP: 15.0000 mL | Freq: Four times a day (QID) | ORAL | Status: DC | PRN

## 2019-05-16 MED ORDER — SODIUM CHLORIDE 0.9% FLUSH
3.0000 mL | Freq: Two times a day (BID) | INTRAVENOUS | Status: DC
  Administered 2019-05-16 – 2019-05-19 (×6): 3 mL via INTRAVENOUS

## 2019-05-16 MED ORDER — SODIUM CHLORIDE 0.9% FLUSH: 3.0000 mL | INTRAVENOUS | Status: DC | PRN

## 2019-05-16 MED ORDER — FUROSEMIDE 40 MG PO TABS
40.0000 mg | ORAL_TABLET | Freq: Every day | ORAL | Status: DC
  Administered 2019-05-16 – 2019-05-17 (×2): 40 mg via ORAL
  Filled 2019-05-16 (×2): qty 1

## 2019-05-16 NOTE — Plan of Care (Signed)

## 2019-05-16 NOTE — Progress Notes (Signed)
2 Days Post-Op Procedure(s) (LRB): MINIMALLY INVASIVE MITRAL VALVE (MV) REPLACEMENT USING CARBOMEDICS OPTIFORM Size 41mm (Right) TRANSESOPHAGEAL ECHOCARDIOGRAM (TEE) (N/A) Subjective: Pain still an issue but much better than yesterday  Objective: Vital signs in last 24 hours: Temp:  [97.9 F (36.6 C)-100.1 F (37.8 C)] 98.8 F (37.1 C) (07/04 0800) Pulse Rate:  [91-103] 101 (07/04 0800) Cardiac Rhythm: Sinus tachycardia (07/04 0800) Resp:  [20-35] 29 (07/04 0800) BP: (107-143)/(50-79) 122/62 (07/04 0800) SpO2:  [91 %-96 %] 96 % (07/04 0800) Weight:  [97.1 kg] 97.1 kg (07/04 0600)  Hemodynamic parameters for last 24 hours:    Intake/Output from previous day: 07/03 0701 - 07/04 0700 In: 1523.2 [P.O.:1080; I.V.:343.2; IV Piggyback:100] Out: 3190 [Urine:2700; Chest Tube:490] Intake/Output this shift: Total I/O In: -  Out: 50 [Chest Tube:50]  General appearance: alert, cooperative and no distress Neurologic: intact Heart: regular rate and rhythm Lungs: diminished breath sounds bibasilar serosanguinous CT drainage  Lab Results: Recent Labs    05/15/19 1815 05/16/19 0236  WBC 9.4 9.1  HGB 10.1* 9.9*  HCT 30.2* 29.6*  PLT 150 144*   BMET:  Recent Labs    05/15/19 1815 05/16/19 0236  NA 135 136  K 3.7 4.1  CL 100 102  CO2 24 27  GLUCOSE 116* 111*  BUN 15 14  CREATININE 1.21 1.21  CALCIUM 8.2* 8.4*    PT/INR:  Recent Labs    05/16/19 0236  LABPROT 15.2  INR 1.2   ABG    Component Value Date/Time   PHART 7.298 (L) 05/14/2019 1507   HCO3 24.6 05/14/2019 1507   TCO2 26 05/14/2019 1507   ACIDBASEDEF 2.0 05/14/2019 1507   O2SAT 99.0 05/14/2019 1507   CBG (last 3)  Recent Labs    05/15/19 2014 05/16/19 0618 05/16/19 0758  GLUCAP 88 100* 123*    Assessment/Plan: S/P Procedure(s) (LRB): MINIMALLY INVASIVE MITRAL VALVE (MV) REPLACEMENT USING CARBOMEDICS OPTIFORM Size 41mm (Right) TRANSESOPHAGEAL ECHOCARDIOGRAM (TEE) (N/A) -CV- in SR  Warfarin 5  mg daily, INR hasn't increased yet RESP- continue IS for atelectasis RENAL- creatinine OK, start PO lasix ENDO- CBG OK- dc CBG., SSI Keep Ct in place- will change to water seal Transfer to 4E   LOS: 2 days    Melrose Nakayama 05/16/2019

## 2019-05-17 ENCOUNTER — Inpatient Hospital Stay (HOSPITAL_COMMUNITY): Payer: BC Managed Care – PPO

## 2019-05-17 ENCOUNTER — Encounter (HOSPITAL_COMMUNITY): Payer: Self-pay | Admitting: Thoracic Surgery (Cardiothoracic Vascular Surgery)

## 2019-05-17 LAB — CBC
HCT: 28.6 % — ABNORMAL LOW (ref 39.0–52.0)
Hemoglobin: 9.4 g/dL — ABNORMAL LOW (ref 13.0–17.0)
MCH: 31.4 pg (ref 26.0–34.0)
MCHC: 32.9 g/dL (ref 30.0–36.0)
MCV: 95.7 fL (ref 80.0–100.0)
Platelets: 144 10*3/uL — ABNORMAL LOW (ref 150–400)
RBC: 2.99 MIL/uL — ABNORMAL LOW (ref 4.22–5.81)
RDW: 12.8 % (ref 11.5–15.5)
WBC: 9.1 10*3/uL (ref 4.0–10.5)
nRBC: 0 % (ref 0.0–0.2)

## 2019-05-17 LAB — BASIC METABOLIC PANEL
Anion gap: 10 (ref 5–15)
BUN: 12 mg/dL (ref 6–20)
CO2: 24 mmol/L (ref 22–32)
Calcium: 8.5 mg/dL — ABNORMAL LOW (ref 8.9–10.3)
Chloride: 101 mmol/L (ref 98–111)
Creatinine, Ser: 1.13 mg/dL (ref 0.61–1.24)
GFR calc Af Amer: >60 mL/min (ref >60)
GFR calc non Af Amer: >60 mL/min (ref >60)
Glucose, Bld: 101 mg/dL — ABNORMAL HIGH (ref 70–99)
Potassium: 3.9 mmol/L (ref 3.5–5.1)
Sodium: 135 mmol/L (ref 135–145)

## 2019-05-17 LAB — PROTIME-INR
INR: 1.8 — ABNORMAL HIGH (ref 0.8–1.2)
Prothrombin Time: 21 seconds — ABNORMAL HIGH (ref 11.4–15.2)

## 2019-05-17 MED ORDER — COUMADIN BOOK
Freq: Once | Status: AC
  Administered 2019-05-17: 17:00:00
  Filled 2019-05-17: qty 1

## 2019-05-17 NOTE — Plan of Care (Signed)
  Problem: Education: Goal: Will demonstrate proper wound care and an understanding of methods to prevent future damage Outcome: Progressing   Problem: Education: Goal: Knowledge of disease or condition will improve Outcome: Progressing   

## 2019-05-17 NOTE — Progress Notes (Signed)
Patient ambulated in hallway independently this afternoon with cane. Sundy Houchins, Bettina Gavia rN

## 2019-05-17 NOTE — Progress Notes (Addendum)
      [address]       [place] [address]             [phone]      3 Days Post-Op Procedure(s) (LRB): MINIMALLY INVASIVE MITRAL VALVE (MV) REPLACEMENT USING CARBOMEDICS OPTIFORM Size 86mm (Right) TRANSESOPHAGEAL ECHOCARDIOGRAM (TEE) (N/A) Subjective: Feels okay this morning. He is having a lot of pain at the incision and chest tube site. He is back on his home pain medication for chronic pain.   Objective: Vital signs in last 24 hours: Temp:  [98.2 F (36.8 C)-98.8 F (37.1 C)] 98.5 F (36.9 C) (07/05 0545) Pulse Rate:  [93-167] 167 (07/05 0545) Cardiac Rhythm: Heart block (07/05 0700) Resp:  [15-27] 26 (07/05 0545) BP: (84-137)/(55-76) 110/55 (07/05 0545) SpO2:  [91 %-100 %] 96 % (07/05 0545) Weight:  [95.8 kg] 95.8 kg (07/05 0545)     Intake/Output from previous day: 07/04 0701 - 07/05 0700 In: 240 [P.O.:240] Out: 1590 [Urine:1400; Chest Tube:190] Intake/Output this shift: No intake/output data recorded.  General appearance: alert, cooperative and no distress Heart: regular rate and rhythm, S1, S2 normal, no murmur, click, rub or gallop Lungs: clear to auscultation bilaterally Abdomen: soft, non-tender; bowel sounds normal; no masses,  no organomegaly Extremities: extremities normal, atraumatic, no cyanosis or edema Wound: clean and dry  Lab Results: Recent Labs    05/16/19 0236 05/17/19 0316  WBC 9.1 9.1  HGB 9.9* 9.4*  HCT 29.6* 28.6*  PLT 144* 144*   BMET:  Recent Labs    05/16/19 0236 05/17/19 0316  NA 136 135  K 4.1 3.9  CL 102 101  CO2 27 24  GLUCOSE 111* 101*  BUN 14 12  CREATININE 1.21 1.13  CALCIUM 8.4* 8.5*    PT/INR:  Recent Labs    05/17/19 0316  LABPROT 21.0*  INR 1.8*   ABG    Component Value Date/Time   PHART 7.298 (L) 05/14/2019 1507   HCO3 24.6 05/14/2019 1507   TCO2 26 05/14/2019 1507   ACIDBASEDEF 2.0 05/14/2019 1507   O2SAT 99.0 05/14/2019 1507   CBG (last 3)  Recent Labs     05/16/19 0618 05/16/19 0758 05/16/19 1134  GLUCAP 100* 123* 120*    Assessment/Plan: S/P Procedure(s) (LRB): MINIMALLY INVASIVE MITRAL VALVE (MV) REPLACEMENT USING CARBOMEDICS OPTIFORM Size 83mm (Right) TRANSESOPHAGEAL ECHOCARDIOGRAM (TEE) (N/A)  1. CV-NSR but brief episode of Afib last night but converted without medication. Continue coumadin-INR 1.8. He is on Amio 200mg  BID. Continue ASA.  2. Pulm-tolerating room air with good oxygen saturation. Continue incentive spirometer. CXR showed a tiny amount of pleural air in the right apex. Bibasilar atelectasis. Chest tube output: 190cc/24 hours.  3. Renal-creatinine 1.13, electrolytes okay. Continue Lasix 40mg  daily 4. H and H 9.4/28.6, expected acute blood loss anemia. 5. Endo-blood glucose has been well controlled   Plan: Chest tube removal today. Need to remove EPW since INR is climbing 1.8 today. Can remove On-Q. Continue to work on pain control. Ambulate x 3 and encouraged to use his incentive spirometer.    LOS: 3 days    Elgie Collard 05/17/2019 Patient seen and examined, agree with above Dc tubes, wires, On_Q Possibly home in AM  Demorest C. Roxan Hockey, MD [facility] [phone]

## 2019-05-17 NOTE — Progress Notes (Signed)
Telemetry shows occasional episodes of 2:1 heart block.

## 2019-05-17 NOTE — Progress Notes (Signed)
Patient up to chair. Dervin Vore, Bettina Gavia rN

## 2019-05-17 NOTE — Plan of Care (Signed)
  Problem: Education: Goal: Will demonstrate proper wound care and an understanding of methods to prevent future damage Outcome: Progressing Goal: Knowledge of disease or condition will improve Outcome: Progressing   Problem: Education: Goal: Knowledge of disease or condition will improve Outcome: Progressing   Problem: Health Behavior/Discharge Planning: Goal: Ability to manage health-related needs will improve Outcome: Progressing

## 2019-05-17 NOTE — Progress Notes (Signed)
Per nursing shift report patient overnight converted to Atrial fib rate controlled briefly and the converted back to NSR. Will monitor patient. Farris Blash, Bettina Gavia RN

## 2019-05-17 NOTE — Progress Notes (Signed)
Bilateral chest tubes removed as ordered. Dressing applied. Patient tolerated well. Will monitor patient. Zackari Ruane, Bettina Gavia rN

## 2019-05-17 NOTE — Progress Notes (Signed)
Patient EPW pulled per protocol and as ordered. All ends intact. Patient tolerated well patient reminded to lie supine approximately one hour. bp 132/66 heart rate 64 on monitor sats 100 percent on room air. Patient requested oxygen for comfort. This was placed on patient. Will monitor patient. Myna Freimark, Bettina Gavia rN

## 2019-05-18 ENCOUNTER — Other Ambulatory Visit: Payer: Self-pay | Admitting: Medical

## 2019-05-18 ENCOUNTER — Telehealth: Payer: Self-pay

## 2019-05-18 DIAGNOSIS — I442 Atrioventricular block, complete: Secondary | ICD-10-CM

## 2019-05-18 DIAGNOSIS — Z954 Presence of other heart-valve replacement: Secondary | ICD-10-CM

## 2019-05-18 LAB — PROTIME-INR
INR: 2.8 — ABNORMAL HIGH (ref 0.8–1.2)
Prothrombin Time: 28.8 seconds — ABNORMAL HIGH (ref 11.4–15.2)

## 2019-05-18 MED ORDER — WARFARIN SODIUM 2.5 MG PO TABS
2.5000 mg | ORAL_TABLET | Freq: Every day | ORAL | Status: DC
  Administered 2019-05-18: 2.5 mg via ORAL
  Filled 2019-05-18: qty 1

## 2019-05-18 NOTE — Progress Notes (Signed)
CARDIAC REHAB PHASE I   PRE:  Rate/Rhythm: 70 HB    BP: sitting     SaO2:   MODE:  Ambulation: 820 ft   POST:  Rate/Rhythm: 84 ? Second deg HB    BP: sitting 154/60     SaO2: 96 RA  Pt up walking with his cane, independent. C/o soreness but overall doing well. He has hip issues that he uses a cane for at baseline. HR stable, ? 2nd deg. Discussed IS, restrictions, exercise, Vit K (gave video to watch), and CRPII. Pt is eager to do CRPII and would like to have referral sent to [facility]. Good reception. [place], ACSM 05/18/2019 9:56 AM

## 2019-05-18 NOTE — Progress Notes (Addendum)
4 Days Post-Op Procedure(s) (LRB): MINIMALLY INVASIVE MITRAL VALVE (MV) REPLACEMENT USING CARBOMEDICS OPTIFORM Size 58mm (Right) TRANSESOPHAGEAL ECHOCARDIOGRAM (TEE) (N/A) Subjective: Still having some incisional pain but says it is better after CT removed. Ambulating independently, having bowel movements.   Objective: Vital signs in last 24 hours: Temp:  [98.1 F (36.7 C)-98.9 F (37.2 C)] 98.1 F (36.7 C) (07/06 0801) Pulse Rate:  [58-94] 62 (07/06 0801) Cardiac Rhythm: Heart block (07/05 1900) Resp:  [15-32] 17 (07/06 0801) BP: (117-141)/(53-66) 119/56 (07/06 0801) SpO2:  [94 %-100 %] 94 % (07/06 0801) Weight:  [95.3 kg] 95.3 kg (07/06 0521)    Intake/Output from previous day: No intake/output data recorded. Intake/Output this shift: No intake/output data recorded.  General appearance: alert, cooperative and mild distress Neurologic: intact Heart: Regular rhythm, minotor shows 2nd degree heart block.  Lungs: Breath sounds clear on the left, few crackles on the left.  Abdomen: soft and non-tender Extremities: No peripheral edema.  Wound: The right chest incision is well approximated and dry, expected bruising.   Lab Results: Recent Labs    05/16/19 0236 05/17/19 0316  WBC 9.1 9.1  HGB 9.9* 9.4*  HCT 29.6* 28.6*  PLT 144* 144*   BMET:  Recent Labs    05/16/19 0236 05/17/19 0316  NA 136 135  K 4.1 3.9  CL 102 101  CO2 27 24  GLUCOSE 111* 101*  BUN 14 12  CREATININE 1.21 1.13  CALCIUM 8.4* 8.5*    PT/INR:  Recent Labs    05/18/19 0422  LABPROT 28.8*  INR 2.8*   ABG    Component Value Date/Time   PHART 7.298 (L) 05/14/2019 1507   HCO3 24.6 05/14/2019 1507   TCO2 26 05/14/2019 1507   ACIDBASEDEF 2.0 05/14/2019 1507   O2SAT 99.0 05/14/2019 1507   CBG (last 3)  Recent Labs    05/16/19 0618 05/16/19 0758 05/16/19 1134  GLUCAP 100* 123* 120*    Assessment/Plan: S/P Procedure(s) (LRB): MINIMALLY INVASIVE MITRAL VALVE (MV) REPLACEMENT USING  CARBOMEDICS OPTIFORM Size 25mm (Right) TRANSESOPHAGEAL ECHOCARDIOGRAM (TEE) (N/A)  -POD4 Mini mitral valve replacement for severe rheumatic valvular disease, symptomatic mitral stenosis. Stable hemodynamics. Pain control adequate on pre-admission analgesics.  Anticoagulated with Coumadin, INR 2.8. Decrease Coumadin to 2.5 mg tonight.  .  -Post-op a-fib-brief episode that converted without intervention on the day of surgery. Oral amiodarone added. He has had another brief episode of afib since then but is now in 2nd degree heart block with VR in the low 60's. K+. Will d/c amiodarone for now and ask cardiology to evaluate.  -DVT PPX-  Lovenox discontinued since INR therapeutic.    LOS: 4 days    Antony Odea, PA-C [phone] 05/18/2019    HR 60 Hold amiodarone patient examined and medical record reviewed,agree with above note. Tharon Aquas Trigt III 05/18/2019

## 2019-05-18 NOTE — Progress Notes (Signed)
Pt ambulated in hallway independently with cane x3 today. Tolerated well.  Clyde Canterbury, RN

## 2019-05-18 NOTE — Telephone Encounter (Signed)
This encounter was created in error - please disregard.

## 2019-05-18 NOTE — Telephone Encounter (Signed)
Still admitted 05/14/2019 - present (4 days) [place]

## 2019-05-18 NOTE — Telephone Encounter (Signed)
-----   Message from Taylor, PA-C sent at 05/18/2019  3:39 PM EDT ----- This is a Dr. Gwenlyn Found patient. Will discharge from [facility] tomorrow.   Will need 7-10 day TOC visit with Dr. Gwenlyn Found or APP Will need 30 day event monitor placed  Patient will need 4-6 week visit with Dr. Gwenlyn Found f/u monitor  Valetta Fuller, are you the right person to place the monitor? If not, please let me know who to send this to.  Thanks  P.S. I am an new [facility] PA

## 2019-05-18 NOTE — Consult Note (Addendum)
Cardiology Consultation:   Patient ID: Darren Haynes MRN: [id number]; DOB: June 16, 1986  Admit date: 05/14/2019 Date of Consult: 05/18/2019  Primary Care Provider: [facility] [place] Primary Cardiologist: Dr. Gwenlyn Found Primary Electrophysiologist:  None    Patient Profile:   Darren Haynes is a 33 y.o. male with a hx of chronic diastolic congestive heart failure, pulmonary hypertension, multiple sclerosis, and severe MS who is being seen today for the evaluation of Second Degree AV block on post-op day 4 Mitral Valve Replacement at the request of Dr. Roxy Manns.  History of Present Illness:   Mr. Darren Haynes presented for Mitral Valve stenosis Valve replacement procedure for severe Rheumatic Mitral Stenosis.   Patient was first seen by Dr. Gwenlyn Found in January 2020. At that time Echo performed 11/21/2018 revealed normal LV systolic function, a peak mitral gradient of 47 mmHg with a mean of 29 mmHg and mitral valve area 1.5 cm with a PA pressure of 69 mmHg.  He was on oral diuretic.  12/11/2018 Left/RIght Heart cath showed  significant mitral valve gradient and normal coronary arteries.   Seen by Dr. Mina Marble at [facility] who performed successful mitral balloon valvuloplasty 12/25/2018. Symptoms greatly improved for the next 2 months   4/30/202 patient returned with progressive dyspnea and lower leg edema. TEE 03/27/2019 showed moderate to severe MS. Echo revealed mild LV dysfunction with an EF of 45 to 50% and a transesophageal echo performed 03/27/2019 suggested moderate to severe mitral stenosis.  04/14/2019 patient returned to see Dr. Gwenlyn Found for progressive dyspnea and lower extremity edema. Was placed on Lasix 80 mg daily. Event monitor showed PACs, PVCs, and episodes of PSVT and NSVT. Patient was seen by cardiothoracic surgery and Mitral Valve replacement was recommended.   Patient was admitted 05/13/2019 for Minimally Invasive Mitral Valve replacement, complicated by post-op Afib. Patient converted to sinus with   IV amio. Patient on coumadin for anticoagulation. Post-op day 2 Complete Heart Block, HR 67 noted on EKG.  Patient denies dizziness, syncope, or chest pain. Working with cardiac rehab and is able to ambulate down the hall with minimal sob. Patient feels some beat irregularities at times. He denies sob at rest but does have some difficulty taking a deep breath due to the post-op pain.   Heart Pathway Score:     Past Medical History:  Diagnosis Date  . Arthritis   . Avascular necrosis ([place])   . CHF (congestive heart failure) ([place])   . Chronic back pain   . Depression   . Dyspnea   . GERD (gastroesophageal reflux disease)   . Heart murmur   . Mitral valve stenosis   . Panic attacks   . Pneumonia    hx 4 time  . Pulmonary hypertension ([place])   . Rheumatic fever   . 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 BALLOON VALVULOPLASTY  12/24/2018   balloon mitral valvuloplasty at [facility]  . MITRAL VALVE REPLACEMENT Right 05/14/2019   Procedure: MINIMALLY INVASIVE MITRAL VALVE (MV) REPLACEMENT USING CARBOMEDICS OPTIFORM Size 49mm;  Surgeon: Rexene Alberts, MD;  Location: [facility];  Service: Open Heart Surgery;  Laterality: Right;  . RIGHT/LEFT HEART CATH AND CORONARY ANGIOGRAPHY N/A 12/11/2018   Procedure: RIGHT/LEFT HEART CATH AND CORONARY ANGIOGRAPHY;  Surgeon: Lorretta Harp, MD;  Location: [place] [facility] LAB;  Service: Cardiovascular;  Laterality:  N/A;  . TEE WITHOUT CARDIOVERSION N/A 12/11/2018   Procedure: TRANSESOPHAGEAL ECHOCARDIOGRAM (TEE);  Surgeon: Thurmon Fairroitoru, Mihai, MD;  [facility];  Service: Cardiovascular;  Laterality: N/A;  . TEE WITHOUT CARDIOVERSION N/A 03/27/2019   Procedure: TRANSESOPHAGEAL ECHOCARDIOGRAM (TEE);  Surgeon: Elease HashimotoNahser, Deloris PingPhilip J, MD;  [facility];  Service: Cardiovascular;   Laterality: N/A;  . TEE WITHOUT CARDIOVERSION N/A 05/14/2019   Procedure: TRANSESOPHAGEAL ECHOCARDIOGRAM (TEE);  Surgeon: Purcell Nailswen, Clarence H, MD;  [facility];  Service: Open Heart Surgery;  Laterality: N/A;  . TYMPANOSTOMY TUBE PLACEMENT       Home Medications:  Prior to Admission medications   Medication Sig Start Date End Date Taking? Authorizing Provider  ALPRAZolam (NIRAVAM) 1 MG dissolvable tablet Take 0.5 mg by mouth 3 (three) times daily as needed for anxiety.    Yes [provider]  aspirin EC 81 MG tablet Take 81 mg by mouth daily.   Yes [provider]  Buprenorphine HCl (BELBUCA) 750 MCG FILM Place 750 mcg inside cheek 2 (two) times daily.    Yes [provider]  busPIRone (BUSPAR) 7.5 MG tablet Take 7.5 mg by mouth 3 (three) times daily.   Yes [provider]  clonazePAM (KLONOPIN) 0.5 MG tablet Take 0.5 mg by mouth at bedtime.   Yes [provider]  etodolac (LODINE XL) 400 MG 24 hr tablet Take 400 mg by mouth every evening.    Yes [provider]  fluticasone-salmeterol (ADVAIR HFA) 45-21 MCG/ACT inhaler Inhale 2 puffs into the lungs 2 (two) times daily.   Yes [provider]  folic acid (FOLVITE) 1 MG tablet Take 1 mg by mouth daily.   Yes [provider]  furosemide (LASIX) 40 MG tablet TAKE 1 TABLET(40 MG) BY MOUTH TWICE DAILY. DO NOT TAKE ON THE DAY OF YOUR PROCEDURE ON 12/11/2018 05/12/19  Yes Runell GessBerry, Jonathan J, MD  gabapentin (NEURONTIN) 300 MG capsule Take 600 mg by mouth 3 (three) times daily.    Yes [provider]  methotrexate 2.5 MG tablet Take 20 mg by mouth once a week.    Yes [provider]  metoprolol succinate (TOPROL-XL) 50 MG 24 hr tablet Take 1 tablet (50 mg total) by mouth daily. 04/14/19  Yes Runell GessBerry, Jonathan J, MD  nortriptyline (PAMELOR) 25 MG capsule Take 25 mg by mouth at bedtime.  10/24/18  Yes [provider]  oxyCODONE-acetaminophen (PERCOCET) 7.5-325 MG  tablet Take 1 tablet by mouth every 4 (four) hours as needed for severe pain.   Yes [provider]  penicillin v potassium (VEETID) 250 MG tablet Take 250 mg by mouth 2 (two) times a day.   Yes [provider]  potassium chloride SA (K-DUR) 20 MEQ tablet TAKE 1 TABLET(20 MEQ) BY MOUTH DAILY 05/12/19  Yes Runell GessBerry, Jonathan J, MD  sertraline (ZOLOFT) 100 MG tablet Take 100 mg by mouth at bedtime.    Yes [provider]  tamsulosin (FLOMAX) 0.4 MG CAPS capsule Take 0.4 mg by mouth every evening.   Yes [provider]  tiZANidine (ZANAFLEX) 4 MG tablet Take 4 mg by mouth 3 (three) times daily. 04/22/19  Yes [provider]  Vitamin D, Ergocalciferol, (DRISDOL) 1.25 MG (50000 UT) CAPS capsule Take 50,000 Units by mouth every Tuesday.    Yes [provider]  famotidine (PEPCID) 20 MG tablet Take 20 mg by mouth 2 (two) times daily as needed for heartburn or indigestion.     [provider]  MELATONIN  PO Take 1 tablet by mouth at bedtime as needed (sleep).    [provider]    Inpatient Medications: Scheduled Meds: . acetaminophen  1,000 mg Oral Q6H  . aspirin EC  81 mg Oral Daily  . bisacodyl  10 mg Oral Daily   Or  . bisacodyl  10 mg Rectal Daily  . Buprenorphine HCl  750 mcg Buccal BID  . clonazePAM  0.5 mg Oral QHS  . gabapentin  600 mg Oral TID  . moving right along book   Does not apply Once  . nortriptyline  25 mg Oral QHS  . pantoprazole  40 mg Oral Daily  . sertraline  100 mg Oral QHS  . sodium chloride flush  3 mL Intravenous Q12H  . tamsulosin  0.4 mg Oral QPM  . warfarin  2.5 mg Oral q1800  . Warfarin - Physician Dosing Inpatient   Does not apply q1800   Continuous Infusions: . sodium chloride     PRN Meds: sodium chloride, alum & mag hydroxide-simeth, ketorolac, magnesium hydroxide, Melatonin, metoprolol tartrate, ondansetron (ZOFRAN) IV, oxyCODONE, sodium chloride flush, zolpidem  Allergies:   No Known  Allergies  Social History:   Social History   Socioeconomic History  . Marital status: Married    Spouse name: Not on file  . Number of children: Not on file  . Years of education: Not on file  . Highest education level: Not on file  Occupational History  . Not on file  Social Needs  . Financial resource strain: Not on file  . Food insecurity    Worry: Not on file    Inability: Not on file  . Transportation needs    Medical: Not on file    Non-medical: Not on file  Tobacco Use  . Smoking status: Never Smoker  . Smokeless tobacco: Current User    Types: Snuff  Substance and Sexual Activity  . Alcohol use: No  . Drug use: No  . Sexual activity: Not on file  Lifestyle  . Physical activity    Days per week: Not on file    Minutes per session: Not on file  . Stress: Not on file  Relationships  . Social Musicianconnections    Talks on phone: Not on file    Gets together: Not on file    Attends religious service: Not on file    Active member of club or organization: Not on file    Attends meetings of clubs or organizations: Not on file    Relationship status: Not on file  . Intimate partner violence    Fear of current or ex partner: Not on file    Emotionally abused: Not on file    Physically abused: Not on file    Forced sexual activity: Not on file  Other Topics Concern  . Not on file  Social History Narrative  . Not on file    Family History:   Family History  Problem Relation Age of Onset  . Heart disease Mother   . Depression Mother   . Hypertension Father   . Hyperlipidemia Father      ROS:  Please see the history of present illness.  All other ROS reviewed and negative.     Physical Exam/Data:   Vitals:   05/17/19 2010 05/18/19 0521 05/18/19 0801 05/18/19 1211  BP: (!) 133/54 (!) 121/59 (!) 119/56 118/60  Pulse: 70 (!) 58 62 (!) 106  Resp: 20 19 17  (!) 22  Temp:  98.9 F (37.2 C) 98.6 F (37 C) 98.1 F (36.7 C) 99.6 F (37.6 C)  TempSrc: Oral Oral  Oral Axillary  SpO2: 96% 96% 94% 100%  Weight:  95.3 kg    Height:        Intake/Output Summary (Last 24 hours) at 05/18/2019 1321 Last data filed at 05/18/2019 1303 Gross per 24 hour  Intake 480 ml  Output -  Net 480 ml   Last 3 Weights 05/18/2019 05/17/2019 05/16/2019  Weight (lbs) 210 lb 211 lb 4.8 oz 214 lb 1.6 oz  Weight (kg) 95.255 kg 95.845 kg 97.115 kg     Body mass index is 26.96 kg/m.  General:  Well nourished, well developed, in no acute distress HEENT: normal Neck: no JVD Endocrine:  No thryomegaly Vascular: No carotid bruits; FA pulses 2+ bilaterally without bruits  Cardiac:  normal S1, S2; irregular rhythm; no murmur  Lungs: CTA bilaterally, no wheezing, rhonchi or rales  Abd: soft, nontender, no hepatomegaly  Ext: minimal pedal edema Musculoskeletal:  No deformities, BUE and BLE strength normal and equal Skin: warm and dry  Neuro:  CNs 2-12 intact, no focal abnormalities noted Psych:  Normal affect   EKG:  The EKG was personally reviewed and demonstrates:  05/16/2019 Complete Heart Block, HR 67 Telemetry:  Telemetry was personally reviewed and demonstrates:Complete Heart Block, HR 60s, occasional PVCs  Relevant CV Studies:  Echo Intraoperative TEE 05/14/2019 POST-OP IMPRESSIONS - Left Ventricle: The left ventricle is unchanged from pre-bypass. - Aorta: The aorta appears unchanged from pre-bypass. - Left Atrial Appendage: The left atrial appendage appears unchanged from pre-bypass. - Aortic Valve: The aortic valve appears unchanged from pre-bypass. - Mitral Valve: A bioprosthetic valve was placed, leaflets thin and leaflets are freely mobile. Normal washing jets for valve type. - Tricuspid Valve: The tricuspid valve appears unchanged from pre-bypass. - Interatrial Septum: The interatrial septum appears unchanged from pre-bypass. - Pericardium: The pericardium appears unchanged from pre-bypass.  PRE-OP FINDINGS  Left Ventricle: The left ventricle has mildly reduced  systolic function, with an ejection fraction of 45-50%. The cavity size was normal. There is no increase in left ventricular wall thickness.  Right/Left Heart Cath and coronary Angiography 12/11/2018 IMPRESSION: Mr. Kerwick has severe mitral stenosis with rheumatic features, normal LV function.  His transvalvular gradient by TEE and right left heart cath for approximately 15 to 17 mmHg.  Apparently his valvular anatomy is suitable for mitral valve balloon valvuloplasty.  I have discussed the case with Dr. Regino Schultze at [facility] who has agreed to see him for consultation and treatment.  The radial sheath was removed and a TR band was placed on the right wrist to achieve patent hemostasis.  The patient left the lab in stable condition.  He will be discharged home today as an outpatient and I will see him back in the office next week in follow-up.   Laboratory Data:  High Sensitivity Troponin:   Recent Labs  Lab 05/07/19 2233  TROPONINIHS 4     Cardiac EnzymesNo results for input(s): TROPONINI in the last 168 hours. No results for input(s): TROPIPOC in the last 168 hours.  Chemistry Recent Labs  Lab 05/15/19 1815 05/16/19 0236 05/17/19 0316  NA 135 136 135  K 3.7 4.1 3.9  CL 100 102 101  CO2 24 27 24   GLUCOSE 116* 111* 101*  BUN 15 14 12   CREATININE 1.21 1.21 1.13  CALCIUM 8.2* 8.4* 8.5*  GFRNONAA >60 >60 >60  GFRAA >60 >60 >60  ANIONGAP No results for input(s): PROT, ALBUMIN, AST, ALT, ALKPHOS, BILITOT in the last 168 hours. Hematology Recent Labs  Lab 05/15/19 1815 05/16/19 0236 05/17/19 0316  WBC 9.4 9.1 9.1  RBC 3.17* 3.09* 2.99*  HGB 10.1* 9.9* 9.4*  HCT 30.2* 29.6* 28.6*  MCV 95.3 95.8 95.7  MCH 31.9 32.0 31.4  MCHC 33.4 33.4 32.9  RDW 13.2 13.2 12.8  PLT 150 144* 144*   BNPNo results for input(s): BNP, PROBNP in the last 168 hours.  DDimer No results for input(s): DDIMER in the last 168 hours.   Radiology/Studies:  Dg Chest Port 1 View  Result Date:  05/17/2019 CLINICAL DATA:  Follow-up valve replacement. EXAM: PORTABLE CHEST 1 VIEW COMPARISON:  05/16/2019 FINDINGS: Left internal jugular venous access sheath has been removed. Right chest tube remains in place. Tiny amount of air at the right pleural apex. Atelectasis in both lower lungs, similar to yesterday. No edema IMPRESSION: Tiny amount of pleural air at the right apex. Bibasilar atelectasis. Electronically Signed   By: Paulina Fusi M.D.   On: 05/17/2019 09:24   Dg Chest Port 1 View  Result Date: 05/16/2019 CLINICAL DATA:  Follow-up atelectasis.  Chest tube. EXAM: PORTABLE CHEST 1 VIEW COMPARISON:  05/15/2019 FINDINGS: Left internal jugular venous access sheath remains in place. Swan-Ganz catheter is been removed. Right chest tube remains in place. No visible pleural air, though there is some motion degradation. Atelectasis in the right upper lung and left lower lung persists. IMPRESSION: No visible pneumothorax on the right. Swan-Ganz catheter removed. Persistent mild atelectasis. Electronically Signed   By: Paulina Fusi M.D.   On: 05/16/2019 08:14   Dg Chest Port 1 View  Result Date: 05/15/2019 CLINICAL DATA:  Assess for atelectasis EXAM: PORTABLE CHEST 1 VIEW COMPARISON:  May 14, 2019 FINDINGS: The heart size and mediastinal contours are stable. A Swan-Ganz catheter is identified unchanged. The heart size is enlarged. Cardiac valvular replacement ring is unchanged. Right chest tube is unchanged. The previously noted opacity of the medial right upper lobe is decreased compared to prior exam. Minimal right apical pneumothorax is unchanged. Mild atelectasis of left lung base is noted. There is no pulmonary edema. The bony structures are stable. IMPRESSION: The previously noted consolidation of the medial right upper lobe is decreased compared to prior exam. Right chest tube is identified unchanged. Minimal right apical pneumothorax is unchanged. Electronically Signed   By: Sherian Rein M.D.   On:  05/15/2019 10:01   Dg Chest Port 1 View  Result Date: 05/14/2019 CLINICAL DATA:  Status post minimally invasive mitral valve replacement EXAM: PORTABLE CHEST 1 VIEW COMPARISON:  May 11, 2019 FINDINGS: There is a Swan-Ganz catheter in place with tip projecting over the right pulmonary artery. There is a right-sided chest tube in place with the tip terminating near the right lung apex. There is a mediastinal drain projecting over the heart. The patient is now status post mitral valve replacement. The cardiac size is mildly increased. There is postoperative atelectasis in the right upper lobe and the lung bases bilaterally. There is no large pneumothorax. There may be small bilateral pleural effusions. IMPRESSION: 1. Lines and tubes as above. 2. Postoperative chest with atelectasis as detailed above. No large pneumothorax. Electronically Signed   By: Katherine Mantle M.D.   On: 05/14/2019 13:56    Assessment and Plan:   1. Complete Heart Block, HR 60s - patient had Mitral Valve replacement, now on post-op day  4 -Complete Heart block on EKG 7/4 -patient was previously on amiodarone for intermittent afib postprocedural, but discontinued due to new findings -consider d/c lopressor prn -Patient ambulating down the hall with minimal sob. Does have pain on deep inspiration likely due to post-op pain.  -Will conservatively monitor symptoms. No immediate indications for pacemaker at this time.  -Patient otherwise asymptomatic -avoid AV nodal blocking agents -outpatient 30 day monitor - we will arrange f/u TOC in 7 days and in 6 weeks  2. Mechanical Mitral Valve Replacement Post-op day 4 - patient improving, working with Cardiac rehab -on coumadin, INR 1.8 today. Goal 2.5-3.5  3. Post-op Afib.   - noted post-op day 1 HR 110s -amio drip started and patient converted to NSR -no new events seen on Telemetry -discontinued amio  We will arrange follow up and monitor  For questions or updates,  please contact [facility] Please consult [email or web address] for contact info under     Signed, Cadence David Stall, PA-C  05/18/2019 1:21 PM    Agree with note by Cadence Fransico Waylin  PA-C.  We are asked to see Mr. Hollenbaugh who is postop day 4 mitral valve replacement for rheumatic MS with a Car[employer]Medics mechanical valve.  He does have mild LV dysfunction and normal coronary arteries.  His valve replacement was uncomplicated.  He is recuperating nicely.  We are asked to see him because of complete heart block.  He did have postop A. fib and was placed on amiodarone.  He converted to sinus rhythm.  Amnio has been discontinued.  He is asymptomatic from his complete heart block.  I suspect this will resolve spontaneously.  His exam is benign.  There is no indication for permanent permanent pacemaker insertion.  We will check an event monitor for 30 days at discharge.  His INR today is 1.8 with a target INR of 2.5-3.5.  Runell Gess, M.D., FACP, [facility], [facility] [address] [place], [place]  [address]  [phone] 05/18/2019 5:12 PM

## 2019-05-19 ENCOUNTER — Other Ambulatory Visit: Payer: Self-pay | Admitting: Physician Assistant

## 2019-05-19 ENCOUNTER — Inpatient Hospital Stay (HOSPITAL_COMMUNITY): Payer: BC Managed Care – PPO

## 2019-05-19 LAB — BASIC METABOLIC PANEL
Anion gap: 11 (ref 5–15)
BUN: 16 mg/dL (ref 6–20)
CO2: 25 mmol/L (ref 22–32)
Calcium: 8.4 mg/dL — ABNORMAL LOW (ref 8.9–10.3)
Chloride: 97 mmol/L — ABNORMAL LOW (ref 98–111)
Creatinine, Ser: 1.12 mg/dL (ref 0.61–1.24)
GFR calc Af Amer: >60 mL/min (ref >60)
GFR calc non Af Amer: >60 mL/min (ref >60)
Glucose, Bld: 99 mg/dL (ref 70–99)
Potassium: 3.1 mmol/L — ABNORMAL LOW (ref 3.5–5.1)
Sodium: 133 mmol/L — ABNORMAL LOW (ref 135–145)

## 2019-05-19 LAB — CBC
HCT: 25.9 % — ABNORMAL LOW (ref 39.0–52.0)
Hemoglobin: 8.7 g/dL — ABNORMAL LOW (ref 13.0–17.0)
MCH: 31.3 pg (ref 26.0–34.0)
MCHC: 33.6 g/dL (ref 30.0–36.0)
MCV: 93.2 fL (ref 80.0–100.0)
Platelets: 174 10*3/uL (ref 150–400)
RBC: 2.78 MIL/uL — ABNORMAL LOW (ref 4.22–5.81)
RDW: 12.3 % (ref 11.5–15.5)
WBC: 6.2 10*3/uL (ref 4.0–10.5)
nRBC: 0 % (ref 0.0–0.2)

## 2019-05-19 LAB — PROTIME-INR
INR: 2.2 — ABNORMAL HIGH (ref 0.8–1.2)
Prothrombin Time: 24.3 seconds — ABNORMAL HIGH (ref 11.4–15.2)

## 2019-05-19 MED ORDER — WARFARIN SODIUM 2.5 MG PO TABS: 2.5000 mg | ORAL_TABLET | Freq: Every day | ORAL | 1 refills | Status: DC

## 2019-05-19 MED ORDER — ACETAMINOPHEN 500 MG PO TABS: 1000.0000 mg | ORAL_TABLET | Freq: Four times a day (QID) | ORAL | 0 refills | Status: DC

## 2019-05-19 MED ORDER — POTASSIUM CHLORIDE CRYS ER 20 MEQ PO TBCR
40.0000 meq | EXTENDED_RELEASE_TABLET | Freq: Once | ORAL | Status: AC
  Administered 2019-05-19: 40 meq via ORAL
  Filled 2019-05-19: qty 2

## 2019-05-19 MED FILL — Electrolyte-R (PH 7.4) Solution: INTRAVENOUS | Qty: 3000 | Status: AC

## 2019-05-19 MED FILL — Sodium Chloride IV Soln 0.9%: INTRAVENOUS | Qty: 2000 | Status: AC

## 2019-05-19 MED FILL — Lidocaine HCl Local Preservative Free (PF) Inj 2%: INTRAMUSCULAR | Qty: 15 | Status: AC

## 2019-05-19 MED FILL — Mannitol IV Soln 20%: INTRAVENOUS | Qty: 500 | Status: AC

## 2019-05-19 MED FILL — Sodium Bicarbonate IV Soln 8.4%: INTRAVENOUS | Qty: 50 | Status: AC

## 2019-05-19 MED FILL — Heparin Sodium (Porcine) Inj 1000 Unit/ML: INTRAMUSCULAR | Qty: 30 | Status: AC

## 2019-05-19 MED FILL — Potassium Chloride Inj 2 mEq/ML: INTRAVENOUS | Qty: 40 | Status: AC

## 2019-05-19 NOTE — Telephone Encounter (Signed)
Still admitted 05/14/2019 - present (5 days) [place]

## 2019-05-19 NOTE — Progress Notes (Signed)
Pt feeling well, has been walking. Reviewed ed and gave pt Off the Beat booklet. Discussed different arrhythmias. Pt receptive.  [place], ACSM 10:07 AM 05/19/2019

## 2019-05-19 NOTE — Progress Notes (Addendum)
      [address]       [place] [address]             [phone]      5 Days Post-Op Procedure(s) (LRB): MINIMALLY INVASIVE MITRAL VALVE (MV) REPLACEMENT USING CARBOMEDICS OPTIFORM Size 51mm (Right) TRANSESOPHAGEAL ECHOCARDIOGRAM (TEE) (N/A) Subjective: No issues this morning. Plan for home today.   Objective: Vital signs in last 24 hours: Temp:  [98 F (36.7 C)-99.6 F (37.6 C)] 98 F (36.7 C) (07/07 0814) Pulse Rate:  [61-106] 94 (07/07 0342) Cardiac Rhythm: Atrial fibrillation (07/07 0700) Resp:  [19-22] 19 (07/07 0814) BP: (112-145)/(55-68) 117/55 (07/07 0814) SpO2:  [96 %-100 %] 100 % (07/07 0814) Weight:  [95.6 kg] 95.6 kg (07/07 0342)     Intake/Output from previous day: 07/06 0701 - 07/07 0700 In: 720 [P.O.:720] Out: -  Intake/Output this shift: No intake/output data recorded.  General appearance: alert, cooperative and no distress Heart: regular rate and rhythm, S1, S2 normal, no murmur, click, rub or gallop Lungs: clear to auscultation bilaterally Abdomen: soft, non-tender; bowel sounds normal; no masses,  no organomegaly Extremities: extremities normal, atraumatic, no cyanosis or edema Wound: clean and dry  Lab Results: Recent Labs    05/17/19 0316 05/19/19 0245  WBC 9.1 6.2  HGB 9.4* 8.7*  HCT 28.6* 25.9*  PLT 144* 174   BMET:  Recent Labs    05/17/19 0316 05/19/19 0245  NA 135 133*  K 3.9 3.1*  CL 101 97*  CO2 24 25  GLUCOSE 101* 99  BUN 12 16  CREATININE 1.13 1.12  CALCIUM 8.5* 8.4*    PT/INR:  Recent Labs    05/19/19 0245  LABPROT 24.3*  INR 2.2*   ABG    Component Value Date/Time   PHART 7.298 (L) 05/14/2019 1507   HCO3 24.6 05/14/2019 1507   TCO2 26 05/14/2019 1507   ACIDBASEDEF 2.0 05/14/2019 1507   O2SAT 99.0 05/14/2019 1507   CBG (last 3)  Recent Labs    05/16/19 1134  GLUCAP 120*    Assessment/Plan: S/P Procedure(s) (LRB): MINIMALLY INVASIVE MITRAL VALVE (MV) REPLACEMENT USING  CARBOMEDICS OPTIFORM Size 70mm (Right) TRANSESOPHAGEAL ECHOCARDIOGRAM (TEE) (N/A)  1. CV- Second degree AVB. Rate 70s. BP well controlled. Holding nodal agents. 2. Pulm- no issues. Tolerating room air with good oxygen saturation.  3. Renal-creatinine 1.12, Potassium 3.1-replacement ordered 4. H and H 8.7/25.9 5. Endo-blood glucose well controlled.  6. Chronic pain- on home medications 7. INR 2.2, continue daily coumadin. INR draw 7/10  Plan: Discharge today. Instructions discussed.    LOS: 5 days    Elgie Collard 05/19/2019 Patient seen and examined, agree with above Supplement K Dc home  Remo Lipps C. Roxan Hockey, MD [facility] [phone]

## 2019-05-19 NOTE — Progress Notes (Signed)
D/C instructions given to patient. Wound care and weight restrictions reviewed. Prescriptions reviewed. All questions answered. [place] medications (6) returned to patient. Wife to escort pt home.  Clyde Canterbury, RN

## 2019-05-19 NOTE — Discharge Instructions (Signed)
Discharge Instructions:  1. You may shower, please wash incisions daily with soap and water and keep dry.  If you wish to cover wounds with dressing you may do so but please keep clean and change daily.  No tub baths or swimming until incisions have completely healed.  If your incisions become red or develop any drainage please call our office at [phone]  2. No Driving until cleared by Dr. Owen's office and you are no longer using narcotic pain medications  3. Monitor your weight daily.. Please use the same scale and weigh at same time... If you gain 3-5 lbs in 48 hours with associated lower extremity swelling, please contact our office at [phone]  4. Fever of 101.5 for at least 24 hours with no source, please contact our office at [phone]  5. Activity- up as tolerated, please walk at least 3 times per day.  Avoid strenuous activity, no lifting, pushing, or pulling with your arms over 8-10 lbs for a minimum of 6 weeks  6. If any questions or concerns arise, please do not hesitate to contact our office at [phone]  

## 2019-05-19 NOTE — Progress Notes (Signed)
Progress Note  Patient Name: Darren Haynes Date of Encounter: 05/19/2019  Primary Cardiologist: Dr. Nanetta Batty  Subjective   Postop day 5 mitral valve replacement with a [employer] mechanical valve for rheumatic mitral stenosis.  Clinically stable for discharge today by Dr. Dorris Fetch  Inpatient Medications    Scheduled Meds: . acetaminophen  1,000 mg Oral Q6H  . aspirin EC  81 mg Oral Daily  . bisacodyl  10 mg Oral Daily   Or  . bisacodyl  10 mg Rectal Daily  . Buprenorphine HCl  750 mcg Buccal BID  . clonazePAM  0.5 mg Oral QHS  . gabapentin  600 mg Oral TID  . moving right along book   Does not apply Once  . nortriptyline  25 mg Oral QHS  . pantoprazole  40 mg Oral Daily  . sertraline  100 mg Oral QHS  . sodium chloride flush  3 mL Intravenous Q12H  . tamsulosin  0.4 mg Oral QPM  . warfarin  2.5 mg Oral q1800  . Warfarin - Physician Dosing Inpatient   Does not apply q1800   Continuous Infusions: . sodium chloride     PRN Meds: sodium chloride, alum & mag hydroxide-simeth, ketorolac, magnesium hydroxide, Melatonin, metoprolol tartrate, ondansetron (ZOFRAN) IV, oxyCODONE, sodium chloride flush, zolpidem   Vital Signs    Vitals:   05/18/19 1211 05/18/19 1937 05/19/19 0342 05/19/19 0814  BP: 118/60 (!) 145/68 112/67 (!) 117/55  Pulse: (!) 106 61 94   Resp: (!) 22 20 19 19   Temp: 99.6 F (37.6 C) 98.6 F (37 C) 98.3 F (36.8 C) 98 F (36.7 C)  TempSrc: Axillary Oral Oral Oral  SpO2: 100% 99% 96% 100%  Weight:   95.6 kg   Height:        Intake/Output Summary (Last 24 hours) at 05/19/2019 1004 Last data filed at 05/19/2019 0939 Gross per 24 hour  Intake 640 ml  Output -  Net 640 ml   Last 3 Weights 05/19/2019 05/18/2019 05/17/2019  Weight (lbs) 210 lb 12.8 oz 210 lb 211 lb 4.8 oz  Weight (kg) 95.618 kg 95.255 kg 95.845 kg      Telemetry    Sinus rhythm, second and third-degree heart block, PVCs- Personally Reviewed  ECG    Performed today-  Personally Reviewed  Physical Exam   GEN: No acute distress.   Neck: No JVD Cardiac: RRR, no murmurs, rubs, or gallops.  Respiratory: Clear to auscultation bilaterally. GI: Soft, nontender, non-distended  MS: No edema; No deformity. Neuro:  Nonfocal  Psych: Normal affect   Labs    High Sensitivity Troponin:   Recent Labs  Lab 05/07/19 2233  TROPONINIHS 4      Cardiac EnzymesNo results for input(s): TROPONINI in the last 168 hours. No results for input(s): TROPIPOC in the last 168 hours.   Chemistry Recent Labs  Lab 05/16/19 0236 05/17/19 0316 05/19/19 0245  NA 136 135 133*  K 4.1 3.9 3.1*  CL 102 101 97*  CO2 27 24 25   GLUCOSE 111* 101* 99  BUN 14 12 16   CREATININE 1.21 1.13 1.12  CALCIUM 8.4* 8.5* 8.4*  GFRNONAA >60 >60 >60  GFRAA >60 >60 >60  ANIONGAP 7 10 11      Hematology Recent Labs  Lab 05/16/19 0236 05/17/19 0316 05/19/19 0245  WBC 9.1 9.1 6.2  RBC 3.09* 2.99* 2.78*  HGB 9.9* 9.4* 8.7*  HCT 29.6* 28.6* 25.9*  MCV 95.8 95.7 93.2  MCH 32.0 31.4 31.3  MCHC 33.4 32.9 33.6  RDW 13.2 12.8 12.3  PLT 144* 144* 174    BNPNo results for input(s): BNP, PROBNP in the last 168 hours.   DDimer No results for input(s): DDIMER in the last 168 hours.   Radiology    Dg Chest 2 View  Result Date: 05/19/2019 CLINICAL DATA:  34 year old male with a history of mitral valve replacement EXAM: CHEST - 2 VIEW COMPARISON:  Multiple prior, 05/17/2019, 05/16/2019, 05/15/2019, 05/14/2019 FINDINGS: Cardiomediastinal silhouette unchanged in size and contour. Surgical changes of minimally invasive mitral valve repair. Interval removal of right-sided thoracostomy tubes, epicardial pacing leads, mediastinal drains. No pneumothorax. Low lung volumes with linear opacities at the lung bases. Similar appearance of coarsened interstitial markings. Meniscus on the lateral view. IMPRESSION: Interval removal of thoracostomy tubes/pleural drains and the epicardial pacing leads with no  pneumothorax. Trace pleural effusions and basilar atelectasis. Surgical changes of minimally invasive mitral valve repair. Electronically Signed   By: Corrie Mckusick D.O.   On: 05/19/2019 08:14    Cardiac Studies   None  Patient Profile     33 y.o. 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 Mina Marble at [facility] in February with initial improvement in symptoms and ultimately recurrent symptoms status post mitral valve replacement with a [employer] mechanical valve by Dr. Zenia Resides 5 days ago.  We were asked to see him yesterday because of complete heart block.  Nodal agents have been held.  Assessment & Plan    1: Mitral valve replacement- postop day 5 MVR with a [employer]Med[employer] mechanical valve.  Clinically improved.  INR 2.2  2: AV nodal conduction abnormalities- second and third-degree heart block, sinus rhythm with PVCs.  Nodal agents have been held.  He did have some perioperative A. fib on amiodarone briefly.  Okay for discharge from our point of view.  We will arrange 30-day event monitor.  Suspect this will resolve within the next 4 to 6 weeks.  No need for permanent transvenous pacemaker at this time.  We will arrange outpatient follow-up with me in the next 4 to 6 weeks.  [facility] will sign off.   Medication Recommendations: None Other recommendations (labs, testing, etc): 30-day event monitor Follow up as an outpatient: Follow-up with me as an outpatient 4 to 6 weeks  For questions or updates, please contact [place] Please consult [email or web address] for contact info under        Signed, Quay Burow, MD  05/19/2019, 10:04 AM

## 2019-05-20 ENCOUNTER — Telehealth: Payer: Self-pay | Admitting: Radiology

## 2019-05-20 NOTE — Telephone Encounter (Signed)
Patient contacted regarding discharge from [facility] on 05/19/2019.  Patient understands to follow up with provider Almyra Deforest, PA on 07/20 at 2:00 PM at [facility] office. Patient understands discharge instructions? Yes Patient understands medications and regiment? Yes Patient understands to bring all medications to this visit? Yes   Patient should have a monitor placed- can we get this scheduled? Thank you!

## 2019-05-20 NOTE — Telephone Encounter (Signed)
Enrolled patient for a 30 day [employer] Event monitor to be mailed. Brief instructions were gone over with the patient and he knows to expect the monitor to arrive in 3-4 days. 

## 2019-05-21 ENCOUNTER — Other Ambulatory Visit: Payer: Self-pay | Admitting: Thoracic Surgery (Cardiothoracic Vascular Surgery)

## 2019-05-21 DIAGNOSIS — Z954 Presence of other heart-valve replacement: Secondary | ICD-10-CM

## 2019-05-22 ENCOUNTER — Ambulatory Visit (INDEPENDENT_AMBULATORY_CARE_PROVIDER_SITE_OTHER): Payer: BC Managed Care – PPO | Admitting: *Deleted

## 2019-05-22 ENCOUNTER — Other Ambulatory Visit: Payer: Self-pay

## 2019-05-22 DIAGNOSIS — Z5181 Encounter for therapeutic drug level monitoring: Secondary | ICD-10-CM

## 2019-05-22 DIAGNOSIS — I050 Rheumatic mitral stenosis: Secondary | ICD-10-CM

## 2019-05-22 DIAGNOSIS — Z954 Presence of other heart-valve replacement: Secondary | ICD-10-CM

## 2019-05-22 LAB — POCT INR: INR: 2.1 (ref 2.0–3.0)

## 2019-05-22 NOTE — Patient Instructions (Signed)
Description   Change your dose to 2 tablets everyday except 1 tablet on Tuesdays, Thursdays and Saturdays. Recheck in one week. Call us with any medication changes or questions # [phone] [facility].     A full discussion of the nature of anticoagulants has been carried out.  A benefit risk analysis has been presented to the patient, so that they understand the justification for choosing anticoagulation at this time. The need for frequent and regular monitoring, precise dosage adjustment and compliance is stressed.  Side effects of potential bleeding are discussed.  The patient should avoid any OTC items containing aspirin or ibuprofen, and should avoid great swings in general diet.  Avoid alcohol consumption.  Call if any signs of abnormal bleeding.

## 2019-05-25 ENCOUNTER — Emergency Department (HOSPITAL_COMMUNITY): Payer: BC Managed Care – PPO

## 2019-05-25 ENCOUNTER — Encounter (INDEPENDENT_AMBULATORY_CARE_PROVIDER_SITE_OTHER): Payer: BC Managed Care – PPO

## 2019-05-25 ENCOUNTER — Emergency Department (HOSPITAL_COMMUNITY)
Admission: EM | Admit: 2019-05-25 | Discharge: 2019-05-26 | Disposition: A | Discharge status: Home / Self Care | Payer: BC Managed Care – PPO | Attending: Emergency Medicine | Admitting: Emergency Medicine

## 2019-05-25 ENCOUNTER — Other Ambulatory Visit: Payer: Self-pay

## 2019-05-25 ENCOUNTER — Encounter (HOSPITAL_COMMUNITY): Payer: Self-pay

## 2019-05-25 DIAGNOSIS — I442 Atrioventricular block, complete: Secondary | ICD-10-CM

## 2019-05-25 DIAGNOSIS — R509 Fever, unspecified: Secondary | ICD-10-CM | POA: Insufficient documentation

## 2019-05-25 DIAGNOSIS — Z5321 Procedure and treatment not carried out due to patient leaving prior to being seen by health care provider: Secondary | ICD-10-CM | POA: Diagnosis not present

## 2019-05-25 LAB — URINALYSIS, ROUTINE W REFLEX MICROSCOPIC
Bilirubin Urine: NEGATIVE
Glucose, UA: NEGATIVE mg/dL
Hgb urine dipstick: NEGATIVE
Ketones, ur: NEGATIVE mg/dL
Leukocytes,Ua: NEGATIVE
Nitrite: NEGATIVE
Protein, ur: NEGATIVE mg/dL
Specific Gravity, Urine: 1.008 (ref 1.005–1.030)
pH: 7 (ref 5.0–8.0)

## 2019-05-25 LAB — COMPREHENSIVE METABOLIC PANEL
ALT: 53 U/L — ABNORMAL HIGH (ref 0–44)
AST: 35 U/L (ref 15–41)
Albumin: 3.1 g/dL — ABNORMAL LOW (ref 3.5–5.0)
Alkaline Phosphatase: 67 U/L (ref 38–126)
Anion gap: 10 (ref 5–15)
BUN: 7 mg/dL (ref 6–20)
CO2: 22 mmol/L (ref 22–32)
Calcium: 8.8 mg/dL — ABNORMAL LOW (ref 8.9–10.3)
Chloride: 102 mmol/L (ref 98–111)
Creatinine, Ser: 1.11 mg/dL (ref 0.61–1.24)
GFR calc Af Amer: >60 mL/min (ref >60)
GFR calc non Af Amer: >60 mL/min (ref >60)
Glucose, Bld: 113 mg/dL — ABNORMAL HIGH (ref 70–99)
Potassium: 4.3 mmol/L (ref 3.5–5.1)
Sodium: 134 mmol/L — ABNORMAL LOW (ref 135–145)
Total Bilirubin: 0.4 mg/dL (ref 0.3–1.2)
Total Protein: 6.7 g/dL (ref 6.5–8.1)

## 2019-05-25 LAB — CBC WITH DIFFERENTIAL/PLATELET
Abs Immature Granulocytes: 0.03 10*3/uL (ref 0.00–0.07)
Basophils Absolute: 0.1 10*3/uL (ref 0.0–0.1)
Basophils Relative: 1 %
Eosinophils Absolute: 0.3 10*3/uL (ref 0.0–0.5)
Eosinophils Relative: 4 %
HCT: 31.4 % — ABNORMAL LOW (ref 39.0–52.0)
Hemoglobin: 9.8 g/dL — ABNORMAL LOW (ref 13.0–17.0)
Immature Granulocytes: 0 %
Lymphocytes Relative: 12 %
Lymphs Abs: 1 10*3/uL (ref 0.7–4.0)
MCH: 30 pg (ref 26.0–34.0)
MCHC: 31.2 g/dL (ref 30.0–36.0)
MCV: 96 fL (ref 80.0–100.0)
Monocytes Absolute: 0.6 10*3/uL (ref 0.1–1.0)
Monocytes Relative: 7 %
Neutro Abs: 6.3 10*3/uL (ref 1.7–7.7)
Neutrophils Relative %: 76 %
Platelets: 402 10*3/uL — ABNORMAL HIGH (ref 150–400)
RBC: 3.27 MIL/uL — ABNORMAL LOW (ref 4.22–5.81)
RDW: 13.3 % (ref 11.5–15.5)
WBC: 8.3 10*3/uL (ref 4.0–10.5)
nRBC: 0 % (ref 0.0–0.2)

## 2019-05-25 LAB — LACTIC ACID, PLASMA: Lactic Acid, Venous: 1.4 mmol/L (ref 0.5–1.9)

## 2019-05-25 MED ORDER — SODIUM CHLORIDE 0.9% FLUSH: 3.0000 mL | Freq: Once | INTRAVENOUS | Status: DC

## 2019-05-25 NOTE — ED Triage Notes (Signed)
Patient reports he had mitral valve replacement on 7/2. He has been running persistent fever as high as 103. He has been taking all medications as prescribed patient is diaphoretic in triage. Appears pale.

## 2019-05-26 ENCOUNTER — Ambulatory Visit (INDEPENDENT_AMBULATORY_CARE_PROVIDER_SITE_OTHER): Payer: Self-pay | Admitting: Physician Assistant

## 2019-05-26 ENCOUNTER — Telehealth: Payer: Self-pay | Admitting: Thoracic Surgery (Cardiothoracic Vascular Surgery)

## 2019-05-26 ENCOUNTER — Other Ambulatory Visit: Payer: Self-pay | Admitting: *Deleted

## 2019-05-26 ENCOUNTER — Telehealth (HOSPITAL_COMMUNITY): Payer: Self-pay

## 2019-05-26 ENCOUNTER — Encounter: Payer: Self-pay | Admitting: Thoracic Surgery (Cardiothoracic Vascular Surgery)

## 2019-05-26 ENCOUNTER — Other Ambulatory Visit: Payer: Self-pay

## 2019-05-26 VITALS — BP 116/63 | HR 108 | Temp 98.3°F | Resp 20 | Ht 74.0 in | Wt 206.0 lb

## 2019-05-26 DIAGNOSIS — Z954 Presence of other heart-valve replacement: Secondary | ICD-10-CM

## 2019-05-26 DIAGNOSIS — I050 Rheumatic mitral stenosis: Secondary | ICD-10-CM

## 2019-05-26 DIAGNOSIS — Z9889 Other specified postprocedural states: Secondary | ICD-10-CM

## 2019-05-26 NOTE — Patient Instructions (Signed)
Continue medications as prescribed.  May treat fever with Tylenol or ibuprofen. Follow up in the office with CXR and CBC next week.

## 2019-05-26 NOTE — Progress Notes (Signed)
HPI: Patient returns for postoperative follow-up having undergone mini mitral valve replacement for rheumatic mitral stenosis by Dr. Cornelius Moras on 05/14/19. The patient's early postoperative recovery while in the hospital was notable for a brief episode of atrial fibrillation for which amiodarone was started.  He then developed complete heart block and we consulted the EP service and a decision was made to discontinue the amiodarone. He otherwise made a progressive recovery.   He has been having fever each night since his discharge. He was seen by his PCP in [facility] on Saturday 05/23/19 and was started on amoxicillin 500mg  TID for 10 days. He presented to the [facility]  ED for evaluation of a fever (102.8)  last night  but was told the wait would be about 11 hours to be seen so he left. Dr. Cornelius Moras spoke to him by phone this morning and asked him to come to our office for evaluation today. He has been having some incisional soreness but no pain. He has an occasional cough that is productive when he uses the IS. He is eating although his appetite has not returned to normal. He is having appropriate bowel function.  His INR on 7/10 was 2.1.    Current Outpatient Medications  Medication Sig Dispense Refill  . acetaminophen (TYLENOL) 500 MG tablet Take 2 tablets (1,000 mg total) by mouth every 6 (six) hours. 30 tablet 0  . ALPRAZolam (NIRAVAM) 1 MG dissolvable tablet Take 0.5 mg by mouth 3 (three) times daily as needed for anxiety.     [name] Kitchen aspirin EC 81 MG tablet Take 81 mg by mouth daily.    . Buprenorphine HCl (BELBUCA) 750 MCG FILM Place 750 mcg inside cheek 2 (two) times daily.     . busPIRone (BUSPAR) 7.5 MG tablet Take 7.5 mg by mouth 3 (three) times daily.    . clonazePAM (KLONOPIN) 0.5 MG tablet Take 0.5 mg by mouth at bedtime.    . famotidine (PEPCID) 20 MG tablet Take 20 mg by mouth 2 (two) times daily as needed for heartburn or indigestion.     . fluticasone-salmeterol (ADVAIR HFA) 45-21  MCG/ACT inhaler Inhale 2 puffs into the lungs 2 (two) times daily.    . folic acid (FOLVITE) 1 MG tablet Take 1 mg by mouth daily.    [name] Kitchen gabapentin (NEURONTIN) 300 MG capsule Take 600 mg by mouth 3 (three) times daily.     [name] Kitchen MELATONIN PO Take 1 tablet by mouth at bedtime as needed (sleep).    . nortriptyline (PAMELOR) 25 MG capsule Take 25 mg by mouth at bedtime.     [name] Kitchen oxyCODONE-acetaminophen (PERCOCET) 7.5-325 MG tablet Take 1 tablet by mouth every 4 (four) hours as needed for severe pain.    [name] Kitchen penicillin v potassium (VEETID) 250 MG tablet Take 250 mg by mouth 2 (two) times a day.    . sertraline (ZOLOFT) 100 MG tablet Take 100 mg by mouth at bedtime.     . tamsulosin (FLOMAX) 0.4 MG CAPS capsule Take 0.4 mg by mouth every evening.    [name] Kitchen tiZANidine (ZANAFLEX) 4 MG tablet Take 4 mg by mouth 3 (three) times daily.    . Vitamin D, Ergocalciferol, (DRISDOL) 1.25 MG (50000 UT) CAPS capsule Take 50,000 Units by mouth every Tuesday.     . warfarin (COUMADIN) 2.5 MG tablet Take 1 tablet (2.5 mg total) by mouth daily at 6 PM. 60 tablet 1   No current facility-administered medications for this visit.  Physical Exam Vital Signs:  T98.3  P108  RR20  BP116/63        O2 Sat 95% Skin:warm and dry Heart:  tachycardic, regular rhythm Chest: Breath sounds clear, diminished in right base. The right chest incision is healing with no sign of complication, CT site sutures were removed.   ABD: Soft, non-tender, active bowel sounds EXTS: No edema or tenderness  Diagnostic Tests:  Contains abnormal data Comprehensive metabolic panel Order: [id number] Status:  Final result Visible to patient:  Yes (MyChart) Next appt:  05/28/2019 at 03:45 PM in Cardiology ([facility])  Ref Range & Units 1d ago 7d ago 9d ago 10d ago  Sodium 135 - 145 mmol/L 134Low   133Low   135  136   Potassium 3.5 - 5.1 mmol/L 4.3  3.1Low   3.9  4.1   Chloride 98 - 111 mmol/L 102  97Low   101  102   CO2 22 - 32 mmol/L 22   25  24  27    Glucose, Bld 70 - 99 mg/dL 113High   99  101High   111High    BUN 6 - 20 mg/dL 7  16  12  14    Creatinine, Ser 0.61 - 1.24 mg/dL 1.11  1.12  1.13  1.21   Calcium 8.9 - 10.3 mg/dL 8.8Low   8.4Low   8.5Low   8.4Low    Total Protein 6.5 - 8.1 g/dL 6.7      Albumin 3.5 - 5.0 g/dL 3.1Low       AST 15 - 41 U/L 35      ALT 0 - 44 U/L 53High       Alkaline Phosphatase 38 - 126 U/L 67      Total Bilirubin 0.3 - 1.2 mg/dL 0.4      GFR calc non Af Amer >60 mL/min >60  >60  >60  >60   GFR calc Af Amer >60 mL/min >60  >60  >60  >60   Anion gap 5 - 15 10  11  CM  10 CM  7 CM   Comment: Performed at [facility], [address] [place], [place] [address]  Resulting Agency  [facility] [place] [facility] [place] [facility]      Specimen Collected: 05/25/19 22:31 Last Resulted: 05/25/19 23:06      Lab Flowsheet    Order Details    View Encounter    Lab and Collection Details    Routing    Result History     CM=Additional comments      Other Results from 05/25/2019  Contains abnormal data CBC with Differential Order: [id number]  Status:  Final result Visible to patient:  Yes (MyChart) Next appt:  05/28/2019 at 03:45 PM in Cardiology ([facility])  Ref Range & Units 1d ago 7d ago 9d ago 10d ago  WBC 4.0 - 10.5 K/uL 8.3  6.2  9.1  9.1   RBC 4.22 - 5.81 MIL/uL 3.27Low   2.78Low   2.99Low   3.09Low    Hemoglobin 13.0 - 17.0 g/dL 9.8Low   8.7Low   9.4Low   9.9Low    HCT 39.0 - 52.0 % 31.4Low   25.9Low   28.6Low   29.6Low    MCV 80.0 - 100.0 fL 96.0  93.2  95.7  95.8   MCH 26.0 - 34.0 pg 30.0  31.3  31.4  32.0   MCHC 30.0 - 36.0 g/dL 31.2  33.6  32.9  33.4   RDW 11.5 - 15.5 % 13.3  12.3  12.8  13.2   Platelets 150 - 400 K/uL 402High   174  144Low   144Low    nRBC 0.0 - 0.2 % 0.0  0.0 CM  0.0 CM  0.0 CM   Neutrophils Relative % % 76      Neutro Abs 1.7 - 7.7 K/uL 6.3      Lymphocytes Relative % 12      Lymphs Abs 0.7 - 4.0 K/uL 1.0      Monocytes  Relative % 7      Monocytes Absolute 0.1 - 1.0 K/uL 0.6      Eosinophils Relative % 4      Eosinophils Absolute 0.0 - 0.5 K/uL 0.3      Basophils Relative % 1      Basophils Absolute 0.0 - 0.1 K/uL 0.1      Immature Granulocytes % 0      Abs Immature Granulocytes 0.00 - 0.07 K/uL 0.03              EXAM: CHEST - 2 VIEW  COMPARISON:  05/19/2019  FINDINGS: Prior valve repair. Mild cardiomegaly. Small to moderate right pleural effusion with right basilar atelectasis or infiltrate. Findings have worsened slightly since prior study. Linear atelectasis or scarring at the left base. No pneumothorax. No acute bony abnormality.  IMPRESSION: Worsening small to moderate right pleural effusion with right lower lobe atelectasis or infiltrate.  Left base scarring or atelectasis.   Electronically Signed   By: Charlett Nose M.D.   On: 05/25/2019 23:07  Impression / Plan: Fever post mini mitral valve replacement on 05/14/19.  He does not appear ill or toxic.  Blood cultures were done at [facility] earlier today and results are pending. Laboratory workup in the ED last night was unremarkable. The CXR did show a small right pleural effusion that may be slightly larger than what was noted on his last CXR in the hospital. I explained that this may cause fever but usually not to the degree he is having.   Findings discussed with Dr. Cornelius Moras.  Plan to continue current medications for now, complete the amoxicillin course as prescribed, and follow up in the office on Monday 06/01/19 for repeat CXR ad CBC.  I asked him to contact us if he develops worsening or new symptoms prior to his visit.     Leary Roca, PA-C [facility] [phone]

## 2019-05-26 NOTE — Telephone Encounter (Signed)
Called and spoke with patient over the telephone after learning that the patient had left the emergency department last night.  He states that he had been running low-grade fevers ever since hospital discharge and was recently evaluated by his primary care physician at [facility].  Details of his evaluation there remain unclear.  He reported to the emergency department yesterday evening stating that he had temperature as high as 103 F.  Lab work and a chest x-ray were performed and the patient was told it would be at least 11 hours before he would be admitted to the hospital.  There is no documentation from any ED physician or APP in the medical record at this time.  The patient left the hospital and went home.  I spoke with the patient this morning and offered him an appointment in our office today where he will be seen by 1 of our APP's.  He needs at least 2 sets of blood cultures to be performed and there is no evidence that this was done in the emergency department last night.  We will need to find out what diagnostic testing was performed at [facility] and whether or not he is currently receiving antibiotics.  We will formulate a plan for his management once he is seen in our office later today.  Patient is agreeable.  Rexene Alberts, MD 05/26/2019 8:07 AM

## 2019-05-26 NOTE — Telephone Encounter (Signed)
Pt insurance is active and benefits verified through [employer]. Co-pay $0.00, DED $250.00/$250.00 met, out of pocket $650.00/$650.00 met, co-insurance 30%. No pre-authorization required. [employer]port, 05/26/2019 @ 1015AM, REF# [id number]
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
## 2019-05-27 ENCOUNTER — Other Ambulatory Visit (HOSPITAL_COMMUNITY): Payer: BLUE CROSS/BLUE SHIELD

## 2019-05-28 ENCOUNTER — Other Ambulatory Visit: Payer: Self-pay

## 2019-05-28 ENCOUNTER — Ambulatory Visit (INDEPENDENT_AMBULATORY_CARE_PROVIDER_SITE_OTHER): Payer: BC Managed Care – PPO | Admitting: Pharmacist Clinician (PhC)/ Clinical Pharmacy Specialist

## 2019-05-28 DIAGNOSIS — Z954 Presence of other heart-valve replacement: Secondary | ICD-10-CM | POA: Diagnosis not present

## 2019-05-28 DIAGNOSIS — I050 Rheumatic mitral stenosis: Secondary | ICD-10-CM | POA: Diagnosis not present

## 2019-05-28 LAB — POCT INR: INR: 4.2 — AB (ref 2.0–3.0)

## 2019-05-29 ENCOUNTER — Telehealth: Payer: Self-pay | Admitting: Cardiovascular Disease

## 2019-05-29 NOTE — Telephone Encounter (Signed)
Called by [employer] that patient having multiple episodes of atrial flutter with variable block. Heart rates 80s - 100s. [employer] unable to reach patient to inquire about symptoms. Will alert patient's cardiologist Dr. Gwenlyn Found.

## 2019-05-29 NOTE — Telephone Encounter (Signed)
Not worried about this in the peri op period. On coumadin AC

## 2019-06-01 ENCOUNTER — Ambulatory Visit (INDEPENDENT_AMBULATORY_CARE_PROVIDER_SITE_OTHER): Payer: BC Managed Care – PPO | Admitting: *Deleted

## 2019-06-01 ENCOUNTER — Ambulatory Visit
Admission: RE | Admit: 2019-06-01 | Discharge: 2019-06-01 | Disposition: A | Discharge status: Home / Self Care | Payer: BC Managed Care – PPO | Source: Ambulatory Visit | Attending: Thoracic Surgery (Cardiothoracic Vascular Surgery) | Admitting: Thoracic Surgery (Cardiothoracic Vascular Surgery)

## 2019-06-01 ENCOUNTER — Telehealth: Payer: Self-pay | Admitting: Physician Assistant

## 2019-06-01 ENCOUNTER — Encounter: Payer: Self-pay | Admitting: Thoracic Surgery (Cardiothoracic Vascular Surgery)

## 2019-06-01 ENCOUNTER — Other Ambulatory Visit: Payer: Self-pay

## 2019-06-01 ENCOUNTER — Ambulatory Visit (INDEPENDENT_AMBULATORY_CARE_PROVIDER_SITE_OTHER): Payer: BC Managed Care – PPO | Admitting: Physician Assistant

## 2019-06-01 ENCOUNTER — Ambulatory Visit (INDEPENDENT_AMBULATORY_CARE_PROVIDER_SITE_OTHER): Payer: Self-pay | Admitting: Thoracic Surgery (Cardiothoracic Vascular Surgery)

## 2019-06-01 VITALS — BP 140/78 | HR 104 | Temp 97.9°F | Resp 20 | Ht 74.0 in | Wt 202.0 lb

## 2019-06-01 VITALS — BP 125/75 | HR 98 | Temp 97.9°F | Ht 74.0 in | Wt 204.2 lb

## 2019-06-01 DIAGNOSIS — I050 Rheumatic mitral stenosis: Secondary | ICD-10-CM | POA: Diagnosis not present

## 2019-06-01 DIAGNOSIS — I480 Paroxysmal atrial fibrillation: Secondary | ICD-10-CM | POA: Diagnosis not present

## 2019-06-01 DIAGNOSIS — R002 Palpitations: Secondary | ICD-10-CM | POA: Diagnosis not present

## 2019-06-01 DIAGNOSIS — Z954 Presence of other heart-valve replacement: Secondary | ICD-10-CM | POA: Diagnosis not present

## 2019-06-01 DIAGNOSIS — I5032 Chronic diastolic (congestive) heart failure: Secondary | ICD-10-CM | POA: Diagnosis not present

## 2019-06-01 LAB — POCT INR: INR: 2.8 (ref 2.0–3.0)

## 2019-06-01 NOTE — Telephone Encounter (Signed)
I called pt to confirm his appt for 06-01-19. ° ° ° ° °  ° ° °COVID-19 Pre-Screening Questions: ° °• In the past 7 to 10 days have you had a cough,  shortness of breath, headache, congestion, fever (100 or greater) body aches, chills, sore throat, or sudden loss of taste or sense of smell? no °• Have you been around anyone with known Covid 19. °• Have you been around anyone who is awaiting Covid 19 test results in the past 7 to 10 days? no °• Have you been around anyone who has been exposed to Covid 19, or has mentioned symptoms of Covid 19 within the past 7 to 10 days? no ° °If you have any concerns/questions about symptoms patients report during screening (either on the phone or at threshold). Contact the provider seeing the patient or DOD for further guidance.  If neither are available contact a member of the leadership team. ° ° ° °   ° ° ° ° ° °

## 2019-06-01 NOTE — Patient Instructions (Addendum)
Continue all previous medications without any changes at this time  You may continue to gradually increase your physical activity as tolerated.  Refrain from any heavy lifting or strenuous use of your arms and shoulders until at least 8 weeks from the time of your surgery, and avoid activities that cause increased pain in your chest on the side of your surgical incision.  Otherwise you may continue to increase activities without any particular limitations.  Increase the intensity and duration of physical activity gradually.  You may return to driving an automobile as long as you are no longer requiring oral narcotic pain relievers during the daytime.  It would be wise to start driving only short distances during the daylight and gradually increase from there as you feel comfortable.  Endocarditis is a potentially serious infection of heart valves or inside lining of the heart.  It occurs more commonly in patients with diseased heart valves (such as patient's with aortic or mitral valve disease) and in patients who have undergone heart valve repair or replacement.  Certain surgical and dental procedures may put you at risk, such as dental cleaning, other dental procedures, or any surgery involving the respiratory, urinary, gastrointestinal tract, gallbladder or prostate gland.   To minimize your chances for develooping endocarditis, maintain good oral health and seek prompt medical attention for any infections involving the mouth, teeth, gums, skin or urinary tract.    Always notify your doctor or dentist about your underlying heart valve condition before having any invasive procedures. You will need to take antibiotics before certain procedures, including all routine dental cleanings or other dental procedures.  Your cardiologist or dentist should prescribe these antibiotics for you to be taken ahead of time.       

## 2019-06-01 NOTE — Progress Notes (Signed)
Cardiology Office Note    Date:  06/03/2019   ID:  QUENCY TOBER, DOB Jun 26, 1986, MRN [id number]  PCP:  [facility]  Cardiologist:  Dr. Allyson Sabal   Chief Complaint  Patient presents with  . Hospitalization Follow-up    History of Present Illness:  Darren Haynes is a 33 y.o. male with PMH of chronic diastolic heart failure, pulmonary hypertension, multiple sclerosis and rheumatic MS s/p MVR.  He was first seen by Dr. Allyson Sabal in January 2020, echocardiogram performed at the time showed normal LV function, elevated PA peak pressure, peak mitral gradient of 47 mmHg and a mean gradient of 29 mmHg.  Left and right heart cath obtained on 12/11/2018 showed significant mitral valve gradient and normal coronary arteries.  He was seen by Dr. Regino Schultze at [facility] who performed a successful mitral balloon valvuloplasty on 12/25/2018.  Symptoms greatly improved for the next 2 months.  He returned in April with progressive dyspnea and lower extremity edema.  TEE obtained on 03/27/2019 showed moderate to severe MS.  Echocardiogram revealed mild LV dysfunction with EF 45 to 50%.  In June 2020, he had progressive dyspnea and lower extremity edema and was placed on diuretic.  Event monitor showed PACs, PVCs and episode of PSVT and NSVT.  Patient was referred to cardiothoracic surgery and mitral valve replacement was recommended.    Patient recently underwent mechanical mitral valve replacement by Dr. Cornelius Moras on 05/13/2019.  Procedure was complicated by postop A. fib, he was converted to sinus rhythm on IV amiodarone therapy.  He was placed on Coumadin for anticoagulation therapy.  On postop day #2, he had episode of complete heart block.  Lopressor PRN was discontinued.  Patient was discharged on 30-day event monitor.  Amiodarone therapy was discontinued.   Patient presents today for cardiology office visit.  Based on the heart monitor report, it is difficult to tell if this is atrial flutter versus wenckebach  second-degree AV block.  He was symptomatic at the time with chest pressure and palpitation, however this quickly resolved.  He is maintaining sinus rhythm with first degree of prolonged AV block at this time.  He is not a candidate for any AV nodal blocking agent as I suspect it will cause advanced heart block.  In the future he may be a candidate for rhythm control if atrial fibrillation recurs.  At this time, I did not recommend any addition of AV nodal blocking agent or antiarrhythmic therapy.  He has been seen by Dr. Cornelius Moras this morning.  He has been having some fever.  Last night, the T-max was 100.  Otherwise his incision site looks clean, dry, without any drainage or redness.   Past Medical History:  Diagnosis Date  . Arthritis   . Avascular necrosis (HCC)   . CHF (congestive heart failure) (HCC)   . Chronic back pain   . Depression   . Dyspnea   . GERD (gastroesophageal reflux disease)   . Heart murmur   . Mitral valve stenosis   . Panic attacks   . Pneumonia    hx 4 time  . Pulmonary hypertension (HCC)   . Rheumatic fever   . 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 BALLOON VALVULOPLASTY  12/24/2018   balloon mitral valvuloplasty at [facility]  . MITRAL VALVE REPLACEMENT Right 05/14/2019  Procedure: MINIMALLY INVASIVE MITRAL VALVE (MV) REPLACEMENT USING CARBOMEDICS OPTIFORM Size 29mm;  Surgeon: Purcell Nailswen, Clarence H, MD;  [facility];  Service: Open Heart Surgery;  Laterality: Right;  . RIGHT/LEFT HEART CATH AND CORONARY ANGIOGRAPHY N/A 12/11/2018   Procedure: RIGHT/LEFT HEART CATH AND CORONARY ANGIOGRAPHY;  Surgeon: Runell GessBerry, Jonathan J, MD;  Location: [facility] INVASIVE CV LAB;  Service: Cardiovascular;  Laterality: N/A;  . TEE WITHOUT CARDIOVERSION N/A 12/11/2018   Procedure: TRANSESOPHAGEAL ECHOCARDIOGRAM (TEE);   Surgeon: Thurmon Fairroitoru, Mihai, MD;  [facility];  Service: Cardiovascular;  Laterality: N/A;  . TEE WITHOUT CARDIOVERSION N/A 03/27/2019   Procedure: TRANSESOPHAGEAL ECHOCARDIOGRAM (TEE);  Surgeon: Elease HashimotoNahser, Deloris PingPhilip J, MD;  [facility];  Service: Cardiovascular;  Laterality: N/A;  . TEE WITHOUT CARDIOVERSION N/A 05/14/2019   Procedure: TRANSESOPHAGEAL ECHOCARDIOGRAM (TEE);  Surgeon: Purcell Nailswen, Clarence H, MD;  [facility];  Service: Open Heart Surgery;  Laterality: N/A;  . TYMPANOSTOMY TUBE PLACEMENT      Current Medications: Outpatient Medications Prior to Visit  Medication Sig Dispense Refill  . acetaminophen (TYLENOL) 500 MG tablet Take 2 tablets (1,000 mg total) by mouth every 6 (six) hours. 30 tablet 0  . ALPRAZolam (NIRAVAM) 1 MG dissolvable tablet Take 0.5 mg by mouth 3 (three) times daily as needed for anxiety.     [name] Kitchen. aspirin EC 81 MG tablet Take 81 mg by mouth daily.    . Buprenorphine HCl (BELBUCA) 750 MCG FILM Place 750 mcg inside cheek 2 (two) times daily.     . busPIRone (BUSPAR) 7.5 MG tablet Take 7.5 mg by mouth 3 (three) times daily.    . clonazePAM (KLONOPIN) 0.5 MG tablet Take 0.5 mg by mouth at bedtime.    . fluticasone-salmeterol (ADVAIR HFA) 45-21 MCG/ACT inhaler Inhale 2 puffs into the lungs 2 (two) times daily.    [name] Kitchen. gabapentin (NEURONTIN) 300 MG capsule Take 600 mg by mouth 3 (three) times daily.     [name] Kitchen. MELATONIN PO Take 1 tablet by mouth at bedtime as needed (sleep).    . nortriptyline (PAMELOR) 25 MG capsule Take 25 mg by mouth at bedtime.     [name] Kitchen. oxyCODONE-acetaminophen (PERCOCET) 7.5-325 MG tablet Take 1 tablet by mouth every 4 (four) hours as needed for severe pain.    [name] Kitchen. penicillin v potassium (VEETID) 250 MG tablet Take 250 mg by mouth 2 (two) times a day.    . sertraline (ZOLOFT) 100 MG tablet Take 100 mg by mouth at bedtime.     . tamsulosin (FLOMAX) 0.4 MG CAPS capsule Take 0.4 mg by mouth every evening.    [name] Kitchen. tiZANidine (ZANAFLEX) 4 MG tablet Take 4 mg  by mouth 3 (three) times daily.    . Vitamin D, Ergocalciferol, (DRISDOL) 1.25 MG (50000 UT) CAPS capsule Take 50,000 Units by mouth every Tuesday.     . warfarin (COUMADIN) 2.5 MG tablet Take 1 tablet (2.5 mg total) by mouth daily at 6 PM. 60 tablet 1  . famotidine (PEPCID) 20 MG tablet Take 20 mg by mouth 2 (two) times daily as needed for heartburn or indigestion.     . folic acid (FOLVITE) 1 MG tablet Take 1 mg by mouth daily.     No facility-administered medications prior to visit.      Allergies:   Patient has no known allergies.   Social History   Socioeconomic History  . Marital status: Married    Spouse name: Not on file  . Number of children: Not on file  . Years  of education: Not on file  . Highest education level: Not on file  Occupational History  . Not on file  Social Needs  . Financial resource strain: Not on file  . Food insecurity    Worry: Not on file    Inability: Not on file  . Transportation needs    Medical: Not on file    Non-medical: Not on file  Tobacco Use  . Smoking status: Never Smoker  . Smokeless tobacco: Current User    Types: Snuff  Substance and Sexual Activity  . Alcohol use: No  . Drug use: No  . Sexual activity: Not on file  Lifestyle  . Physical activity    Days per week: Not on file    Minutes per session: Not on file  . Stress: Not on file  Relationships  . Social Musicianconnections    Talks on phone: Not on file    Gets together: Not on file    Attends religious service: Not on file    Active member of club or organization: Not on file    Attends meetings of clubs or organizations: Not on file    Relationship status: Not on file  Other Topics Concern  . Not on file  Social History Narrative  . Not on file     Family History:  The patient's family history includes Depression in his mother; Heart disease in his mother; Hyperlipidemia in his father; Hypertension in his father.   ROS:   Please see the history of present illness.     ROS All other systems reviewed and are negative.   PHYSICAL EXAM:   VS:  BP 125/75   Pulse 98   Temp 97.9 F (36.6 C) (Temporal)   Ht 6\' 2"  (1.88 m)   Wt 204 lb 3.2 oz (92.6 kg)   SpO2 99%   BMI 26.22 kg/m    GEN: Well nourished, well developed, in no acute distress  HEENT: normal  Neck: no JVD, carotid bruits, or masses Cardiac: RRR; no murmurs, rubs, or gallops,no edema  Respiratory:  clear to auscultation bilaterally, normal work of breathing GI: soft, nontender, nondistended, + BS MS: no deformity or atrophy  Skin: warm and dry, no rash Neuro:  Alert and Oriented x 3, Strength and sensation are intact Psych: euthymic mood, full affect  Wt Readings from Last 3 Encounters:  06/01/19 204 lb 3.2 oz (92.6 kg)  06/01/19 202 lb (91.6 kg)  05/26/19 206 lb (93.4 kg)      Studies/Labs Reviewed:   EKG:  EKG is ordered today.  The ekg ordered today demonstrates normal sinus rhythm with first-degree AV block.  Recent Labs: 05/07/2019: B Natriuretic Peptide 168.8 05/15/2019: Magnesium 2.2 05/25/2019: ALT 53; BUN 7; Creatinine, Ser 1.11; Hemoglobin 9.8; Platelets 402; Potassium 4.3; Sodium 134   Lipid Panel No results found for: CHOL, TRIG, HDL, CHOLHDL, VLDL, LDLCALC, LDLDIRECT  Additional studies/ records that were reviewed today include:   TEE 03/27/2019 IMPRESSIONS    1. No evidence of a thrombus present in the left atrial appendage.  2. The mitral valve is abnormal. Mild thickening of the mitral valve leaflet. Moderate-severe mitral valve stenosis.  3. 3 D images of the MV were obtained . Suggestive of moderate - severe mitral stenosis.  4. Aortic valve regurgitation is trivial by color flow Doppler.  5. The aortic root, ascending aorta and descending aorta are normal in size and structure.  6. The left ventricle has mildly reduced systolic function, with an  ejection fraction of 45-50%.   MVR 05/14/2019 Procedure:        Minimally-Invasive Mitral Valve Replacement              Sorin Carbomedics Bileaflet Mechanical Valve (size 29 mm, catalog # [id number], serial # [id number])   ASSESSMENT:    1. Palpitations   2. S/P minimally invasive mitral valve replacement with mechanical valve   3. PAF (paroxysmal atrial fibrillation) ([place])   4. Chronic diastolic heart failure (HCC)      PLAN:  In order of problems listed above:  1. Palpitation: Patient had postop atrial fibrillation and transient complete heart block.  He is currently wearing a heart monitor, her monitor report shows possible atrial flutter versus second-degree heart block.  Avoid all AV nodal blocking agent.  He has baseline EKG demonstrated prolonged first-degree heart block likely represent underlying conduction disorder.  2. Status post mechanical mitral valve: Continue aspirin and Coumadin.  3. Chronic diastolic heart failure: Euvolemic on physical exam.    Medication Adjustments/Labs and Tests Ordered: Current medicines are reviewed at length with the patient today.  Concerns regarding medicines are outlined above.  Medication changes, Labs and Tests ordered today are listed in the Patient Instructions below. Patient Instructions  Medication Instructions:  Your physician recommends that you continue on your current medications as directed. Please refer to the Current Medication list given to you today.  If you need a refill on your cardiac medications before your next appointment, please call your pharmacy.   Lab work: NONE ordered at this time of appointment   If you have labs (blood work) drawn today and your tests are completely normal, you will receive your results only by: [name] Kitchen MyChart Message (if you have MyChart) OR . A paper copy in the mail If you have any lab test that is abnormal or we need to change your treatment, we will call you to review the results.  Testing/Procedures: NONE ordered at this time of appointment   Follow-Up: At [facility], you and your health  needs are our priority.  As part of our continuing mission to provide you with exceptional heart care, we have created designated Provider Care Teams.  These Care Teams include your primary Cardiologist (physician) and Advanced Practice Providers (APPs -  Physician Assistants and Nurse Practitioners) who all work together to provide you with the care you need, when you need it. You will need a follow up appointment in 3 months.  Please call our office 2 months in advance to schedule this appointment.  You may see Quay Burow, MD or one of the following Advanced Practice Providers on your designated Care Team:   Kerin Ransom, PA-C Roby Lofts, [place] . Sande Rives, PA-C  Any Other Special Instructions Will Be Listed Below (If Applicable).       Hilbert Corrigan, [place]  06/03/2019 11:25 PM    [facility] [address], [place], [place]  [address] Phone: [phone]; Fax: [phone]

## 2019-06-01 NOTE — Patient Instructions (Signed)
Description   Start  taking 1.5 tablets everyday except 1 tablet on Mondays and Fridays. Repeat next week.

## 2019-06-01 NOTE — Patient Instructions (Addendum)
Medication Instructions:  Your physician recommends that you continue on your current medications as directed. Please refer to the Current Medication list given to you today.  If you need a refill on your cardiac medications before your next appointment, please call your pharmacy.   Lab work: NONE ordered at this time of appointment   If you have labs (blood work) drawn today and your tests are completely normal, you will receive your results only by: [name] Kitchen MyChart Message (if you have MyChart) OR . A paper copy in the mail If you have any lab test that is abnormal or we need to change your treatment, we will call you to review the results.  Testing/Procedures: NONE ordered at this time of appointment   Follow-Up: At [facility], you and your health needs are our priority.  As part of our continuing mission to provide you with exceptional heart care, we have created designated Provider Care Teams.  These Care Teams include your primary Cardiologist (physician) and Advanced Practice Providers (APPs -  Physician Assistants and Nurse Practitioners) who all work together to provide you with the care you need, when you need it. You will need a follow up appointment in 3 months.  Please call our office 2 months in advance to schedule this appointment.  You may see Quay Burow, MD or one of the following Advanced Practice Providers on your designated Care Team:   Kerin Ransom, PA-C Roby Lofts, [place] . Sande Rives, PA-C  Any Other Special Instructions Will Be Listed Below (If Applicable).

## 2019-06-01 NOTE — Progress Notes (Signed)
[address]       [place] [address]             [phone]     CARDIOTHORACIC SURGERY OFFICE NOTE  Referring Provider is Runell Gess, MD PCP is [facility]   HPI:  Patient is a 33 year old male with rheumatic mitral stenosis, chronic diastolic congestive heart failure, pulmonary hypertension, rheumatoid arthritis, and possible multiple sclerosis who returns the office today for follow-up status post minimally invasive mitral valve replacement using a mechanical prosthetic valve on May 14, 2019.  The patient's early postoperative recovery in the hospital was notable for a brief episode of atrial fibrillation for which amiodarone was started.  He subsequently developed complete heart block and amiodarone was stopped.  Rhythm recovered and he maintained sinus rhythm with long first-degree AV block.  He was seen in consultation by the EP service and sent home with a ZIO patch in place.  He was last seen here in our office for follow-up on May 26, 2019.  He has had some low-grade fevers intermittently since hospital discharge.  He returns the office today and reports that he is doing well.  Fevers have subsided.  He states that over the last week he has only had 1 isolated temperature of 100 F.  He has mild residual soreness in his right chest which continues to gradually improve.  Pain is exacerbated by reaching or motion with the right arm and shoulder.  He denies any shortness of breath and in fact he states that already his breathing feels much better than it has felt in "years".  He denies any productive cough.  Appetite is slowly improving.  He is starting to walk more at home.  He has had his prothrombin time checked and Coumadin dose adjusted at the Coumadin clinic.  Most recent INR was 4.2 on May 28, 2019.   Current Outpatient Medications  Medication Sig Dispense Refill  . acetaminophen (TYLENOL) 500 MG tablet Take 2 tablets (1,000 mg total) by  mouth every 6 (six) hours. 30 tablet 0  . ALPRAZolam (NIRAVAM) 1 MG dissolvable tablet Take 0.5 mg by mouth 3 (three) times daily as needed for anxiety.     [name] Kitchen aspirin EC 81 MG tablet Take 81 mg by mouth daily.    . Buprenorphine HCl (BELBUCA) 750 MCG FILM Place 750 mcg inside cheek 2 (two) times daily.     . busPIRone (BUSPAR) 7.5 MG tablet Take 7.5 mg by mouth 3 (three) times daily.    . clonazePAM (KLONOPIN) 0.5 MG tablet Take 0.5 mg by mouth at bedtime.    . famotidine (PEPCID) 20 MG tablet Take 20 mg by mouth 2 (two) times daily as needed for heartburn or indigestion.     . fluticasone-salmeterol (ADVAIR HFA) 45-21 MCG/ACT inhaler Inhale 2 puffs into the lungs 2 (two) times daily.    . folic acid (FOLVITE) 1 MG tablet Take 1 mg by mouth daily.    [name] Kitchen gabapentin (NEURONTIN) 300 MG capsule Take 600 mg by mouth 3 (three) times daily.     [name] Kitchen MELATONIN PO Take 1 tablet by mouth at bedtime as needed (sleep).    . nortriptyline (PAMELOR) 25 MG capsule Take 25 mg by mouth at bedtime.     [name] Kitchen oxyCODONE-acetaminophen (PERCOCET) 7.5-325 MG tablet Take 1 tablet by mouth every 4 (four) hours as needed for severe pain.    [name] Kitchen penicillin v potassium (VEETID) 250 MG tablet Take 250  mg by mouth 2 (two) times a day.    . sertraline (ZOLOFT) 100 MG tablet Take 100 mg by mouth at bedtime.     . tamsulosin (FLOMAX) 0.4 MG CAPS capsule Take 0.4 mg by mouth every evening.    [name] Kitchen tiZANidine (ZANAFLEX) 4 MG tablet Take 4 mg by mouth 3 (three) times daily.    . Vitamin D, Ergocalciferol, (DRISDOL) 1.25 MG (50000 UT) CAPS capsule Take 50,000 Units by mouth every Tuesday.     . warfarin (COUMADIN) 2.5 MG tablet Take 1 tablet (2.5 mg total) by mouth daily at 6 PM. 60 tablet 1   No current facility-administered medications for this visit.       Physical Exam:   BP 140/78   Pulse (!) 104   Temp 97.9 F (36.6 C)   Resp 20   Ht 6\' 2"  (1.88 m)   Wt 202 lb (91.6 kg)   SpO2 97%   BMI 25.94 kg/m   General:  Well-appearing   Chest:   Clear to auscultation with slightly diminished breath sounds right lung base but good air movement bilaterally  CV:   Regular rate and rhythm with mechanical heart valve sounds  Incisions:  Healing nicely  Abdomen:  Soft nontender  Extremities:  Warm and well-perfused  Diagnostic Tests:   COMPARISON:  05/25/2019  FINDINGS: Postoperative changes from mitral valve repair. Heart is borderline in size. Elevation of the right hemidiaphragm with right base atelectasis or scarring, similar to prior study. Left lung clear. No effusions or pneumothorax. No acute bony abnormality.  IMPRESSION: Prior mitral valve repair.  Borderline heart size.  Right base atelectasis or scarring.  No pneumothorax.   Electronically Signed   By: Rolm Baptise M.D.   On: 06/01/2019 10:17   Impression:  Patient is doing well approximately 3 weeks status post minimally invasive mitral valve replacement using a bileaflet mechanical prosthetic valve.  Chest x-ray demonstrates some mild residual atelectasis with diminished lung volume on the right side which is not unusual following right mini thoracotomy approach for surgery.  This may be the source of the patient's intermittent low-grade temperatures that appear to be resolving.  By report the patient has not had any episodes of bradycardia which might suggest residual complete heart block, although final report from the ZIO patch remains pending.    Plan:  I have encouraged the patient to continue to gradually increase his physical activity as tolerated with care to avoid any heavy lifting or reaching with his right upper extremity for at least another 4 weeks.  We have not recommended any changes to his current medications.  Patient will return to our office for routine follow-up in approximately 2 months.  We will get another follow-up chest x-ray at that time.  The patient will call and return to see [place] sooner should specific problems or  questions arise.      Valentina Gu. Roxy Manns, MD 06/01/2019 11:15 AM

## 2019-06-03 ENCOUNTER — Encounter: Payer: Self-pay | Admitting: Physician Assistant

## 2019-06-03 LAB — CULTURE BLOOD MANUAL
Micro Number: 669574
Micro Number: 669575
Result: NO GROWTH
Result: NO GROWTH
Specimen Quality: ADEQUATE
Specimen Quality: ADEQUATE

## 2019-06-05 ENCOUNTER — Telehealth: Payer: Self-pay

## 2019-06-05 NOTE — Telephone Encounter (Signed)
LMTCB to ask COVID prescreening questions prior to appt with [facility] on Tuesday, 7/28.                  [name] Kitchen

## 2019-06-09 ENCOUNTER — Other Ambulatory Visit: Payer: Self-pay

## 2019-06-09 ENCOUNTER — Ambulatory Visit (INDEPENDENT_AMBULATORY_CARE_PROVIDER_SITE_OTHER): Payer: BC Managed Care – PPO | Admitting: Pharmacist Clinician (PhC)/ Clinical Pharmacy Specialist

## 2019-06-09 DIAGNOSIS — Z954 Presence of other heart-valve replacement: Secondary | ICD-10-CM | POA: Diagnosis not present

## 2019-06-09 DIAGNOSIS — I050 Rheumatic mitral stenosis: Secondary | ICD-10-CM

## 2019-06-09 DIAGNOSIS — Z7901 Long term (current) use of anticoagulants: Secondary | ICD-10-CM

## 2019-06-09 LAB — POCT INR: INR: 2.9 (ref 2.0–3.0)

## 2019-06-11 ENCOUNTER — Encounter: Payer: Self-pay | Admitting: Physician Assistant

## 2019-06-11 ENCOUNTER — Ambulatory Visit (INDEPENDENT_AMBULATORY_CARE_PROVIDER_SITE_OTHER): Payer: BC Managed Care – PPO | Admitting: Physician Assistant

## 2019-06-11 ENCOUNTER — Telehealth: Payer: Self-pay | Admitting: Cardiovascular Disease

## 2019-06-11 ENCOUNTER — Other Ambulatory Visit: Payer: Self-pay

## 2019-06-11 ENCOUNTER — Telehealth (HOSPITAL_COMMUNITY): Payer: Self-pay

## 2019-06-11 VITALS — BP 100/62 | HR 87 | Temp 97.2°F | Ht 74.0 in | Wt 205.0 lb

## 2019-06-11 DIAGNOSIS — Z954 Presence of other heart-valve replacement: Secondary | ICD-10-CM

## 2019-06-11 DIAGNOSIS — I442 Atrioventricular block, complete: Secondary | ICD-10-CM

## 2019-06-11 DIAGNOSIS — I471 Supraventricular tachycardia: Secondary | ICD-10-CM | POA: Diagnosis not present

## 2019-06-11 DIAGNOSIS — I050 Rheumatic mitral stenosis: Secondary | ICD-10-CM | POA: Diagnosis not present

## 2019-06-11 DIAGNOSIS — Z7901 Long term (current) use of anticoagulants: Secondary | ICD-10-CM

## 2019-06-11 DIAGNOSIS — R42 Dizziness and giddiness: Secondary | ICD-10-CM

## 2019-06-11 DIAGNOSIS — I480 Paroxysmal atrial fibrillation: Secondary | ICD-10-CM

## 2019-06-11 NOTE — Telephone Encounter (Signed)
Received call from patient's mother she stated son woke up feeling bad this morning.Stated he gets dizzy when he stands up.B/P 90/50.Stated he feels like heart out of rhythm.Occasional chest pain.No chest pain at present.Patient requesting to be seen.Appointment scheduled with Fabian Sharp PA this afternoon at 2:15 pm.

## 2019-06-11 NOTE — Progress Notes (Signed)
Cardiology Office Note:    Date:  06/11/2019   ID:  Darren Haynes, DOB 12/28/1985, MRN [id number]  PCP:  [facility]  Cardiologist:  Nanetta Batty, MD   Referring MD: [facility]   Chief Complaint  Patient presents with  . Follow-up    near-syncope    History of Present Illness:    Darren Haynes is a 33 y.o. male with a history of chronic diastolic heart failure, MS, Hypertension, and rheumatic mitral stenosis. He was first seen by Dr. Allyson Sabal Jan 2020 with echo showing normal LVEF, elevated PA pak pressure, and a peak mitral gradient of 47 mmHg. Right and left heart cath revealed normal coronary arteries and significant mitral valve gradient. He underwent mitral valve balloon valvuloplasty on 12/25/18 at [facility]. Unfortunately, he returned to Dr. Allyson Sabal in April with progressive dyspnea and lower extremity edema. TEE 03/27/19 with moderate to severe MS.  He eventually underwent minimally invasive mitral valve replacement with mechanical valve. Post-op course was complicated by complete heart block. BB was D/C'ed and he was discharged on a 30-day monitor. Amiodarone therapy was D/C'ed.  He followed up with [facility] PAC. Heart monitor was difficult to interpret with possible episodes of atrial flutter vs second degree AV block. He is symptomatic with chest pressure and palpitations. In clinic, he was in sinus rhythm but with first degree heart block and prolonged AV block.   He was added to my schedule today for dizziness, hypotension, and just "feels like heart is out of rhythm."  On presentation, EKG with left atrial enlargement and first degree heart block. He has felt dizzy and felt near-syncope approximately 5 times per day. He was hypotensive in the 90s today. Of note, he has also vomited from acid reflux. In clinic, his pressure is low-normal. He is not orthostatic today. He is trying to hydrate. I will check electrolytes today. Of note, he started a new RA medication  2 days ago and continues to take flomax.   Orthostatic vitals: Lying 126/82, 85 Sitting 108/70, 93 Standing 120/72, 102 Standing 3 min 117/70, 100   Past Medical History:  Diagnosis Date  . Arthritis   . Avascular necrosis (HCC)   . CHF (congestive heart failure) (HCC)   . Chronic back pain   . Depression   . Dyspnea   . GERD (gastroesophageal reflux disease)   . Heart murmur   . Mitral valve stenosis   . Panic attacks   . Pneumonia    hx 4 time  . Pulmonary hypertension (HCC)   . Rheumatic fever   . 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 BALLOON VALVULOPLASTY  12/24/2018   balloon mitral valvuloplasty at [facility]  . MITRAL VALVE REPLACEMENT Right 05/14/2019   Procedure: MINIMALLY INVASIVE MITRAL VALVE (MV) REPLACEMENT USING CARBOMEDICS OPTIFORM Size 77mm;  Surgeon: Purcell Nails, MD;  Location: [facility] OR;  Service: Open Heart Surgery;  Laterality: Right;  . RIGHT/LEFT HEART CATH AND CORONARY ANGIOGRAPHY N/A 12/11/2018   Procedure: RIGHT/LEFT HEART CATH AND CORONARY ANGIOGRAPHY;  Surgeon: Runell Gess, MD;  Location: [facility] INVASIVE CV LAB;  Service: Cardiovascular;  Laterality: N/A;  . TEE WITHOUT CARDIOVERSION N/A 12/11/2018   Procedure: TRANSESOPHAGEAL ECHOCARDIOGRAM (TEE);  Surgeon: Thurmon Fair, MD;  Location: [facility] ENDOSCOPY;  Service: Cardiovascular;  Laterality: N/A;  . TEE  WITHOUT CARDIOVERSION N/A 03/27/2019   Procedure: TRANSESOPHAGEAL ECHOCARDIOGRAM (TEE);  Surgeon: Elease HashimotoNahser, Deloris PingPhilip J, MD;  [facility];  Service: Cardiovascular;  Laterality: N/A;  . TEE WITHOUT CARDIOVERSION N/A 05/14/2019   Procedure: TRANSESOPHAGEAL ECHOCARDIOGRAM (TEE);  Surgeon: Purcell Nailswen, Clarence H, MD;  [facility];  Service: Open Heart Surgery;  Laterality: N/A;  . TYMPANOSTOMY TUBE PLACEMENT      Current  Medications: Current Meds  Medication Sig  . acetaminophen (TYLENOL) 500 MG tablet Take 2 tablets (1,000 mg total) by mouth every 6 (six) hours.  . ALPRAZolam (NIRAVAM) 1 MG dissolvable tablet Take 0.5 mg by mouth 3 (three) times daily as needed for anxiety.   [name] Kitchen. aspirin EC 81 MG tablet Take 81 mg by mouth daily.  . Buprenorphine HCl (BELBUCA) 750 MCG FILM Place 750 mcg inside cheek 2 (two) times daily.   . busPIRone (BUSPAR) 7.5 MG tablet Take 7.5 mg by mouth 3 (three) times daily.  . clonazePAM (KLONOPIN) 0.5 MG tablet Take 0.5 mg by mouth at bedtime.  . famotidine (PEPCID) 40 MG tablet Take 40 mg by mouth daily.  . fluticasone-salmeterol (ADVAIR HFA) 45-21 MCG/ACT inhaler Inhale 2 puffs into the lungs 2 (two) times daily.  [name] Kitchen. gabapentin (NEURONTIN) 300 MG capsule Take 600 mg by mouth 3 (three) times daily.   [name] Kitchen. MELATONIN PO Take 1 tablet by mouth at bedtime as needed (sleep).  . nortriptyline (PAMELOR) 25 MG capsule Take 25 mg by mouth at bedtime.   [name] Kitchen. oxyCODONE-acetaminophen (PERCOCET) 7.5-325 MG tablet Take 1 tablet by mouth every 4 (four) hours as needed for severe pain.  [name] Kitchen. penicillin v potassium (VEETID) 250 MG tablet Take 250 mg by mouth 2 (two) times a day.  . sertraline (ZOLOFT) 100 MG tablet Take 100 mg by mouth at bedtime.   [name] Kitchen. tiZANidine (ZANAFLEX) 4 MG tablet Take 4 mg by mouth 3 (three) times daily.  . Vitamin D, Ergocalciferol, (DRISDOL) 1.25 MG (50000 UT) CAPS capsule Take 50,000 Units by mouth every Tuesday.   . warfarin (COUMADIN) 2.5 MG tablet Take 1 tablet (2.5 mg total) by mouth daily at 6 PM.  . [DISCONTINUED] leflunomide (ARAVA) 20 MG tablet Take 20 mg by mouth daily.  . [DISCONTINUED] tamsulosin (FLOMAX) 0.4 MG CAPS capsule Take 0.4 mg by mouth every evening.     Allergies:   Patient has no known allergies.   Social History   Socioeconomic History  . Marital status: Married    Spouse name: Not on file  . Number of children: Not on file  . Years of education: Not on  file  . Highest education level: Not on file  Occupational History  . Not on file  Social Needs  . Financial resource strain: Not on file  . Food insecurity    Worry: Not on file    Inability: Not on file  . Transportation needs    Medical: Not on file    Non-medical: Not on file  Tobacco Use  . Smoking status: Never Smoker  . Smokeless tobacco: Current User    Types: Snuff  Substance and Sexual Activity  . Alcohol use: No  . Drug use: No  . Sexual activity: Not on file  Lifestyle  . Physical activity    Days per week: Not on file    Minutes per session: Not on file  . Stress: Not on file  Relationships  . Social Musicianconnections    Talks on phone: Not on file    Gets together: Not  on file    Attends religious service: Not on file    Active member of club or organization: Not on file    Attends meetings of clubs or organizations: Not on file    Relationship status: Not on file  Other Topics Concern  . Not on file  Social History Narrative  . Not on file     Family History: The patient's family history includes Depression in his mother; Heart disease in his mother; Hyperlipidemia in his father; Hypertension in his father.  ROS:   Please see the history of present illness.     All other systems reviewed and are negative.  EKGs/Labs/Other Studies Reviewed:    The following studies were reviewed today:  Echo 03/13/19:  1. The left ventricle has mildly reduced systolic function, with an ejection fraction of 45-50%. The cavity size was normal. Left ventricular diastolic Doppler parameters are consistent with impaired relaxation.  2. Endocardium is difficult to see. LVEF is appears mildly decreased with inferior hypokinesis WOuld consider limited TTE with Definity to further define wall motion and LVEF.  3. Small pericardial effusion.  4. The mitral valve is abnormal. Moderate thickening of the mitral valve leaflet. Mild-moderate mitral valve stenosis.  5. MV is thickend with  restricted diastolic motion. Peak and mean gradients through the valve are 20 and 8 mm Hg respectively. MVA by P T1/2 is 1.69 cm2 consistent with mild to moderate MS.  6. The aortic valve is tricuspid. Mild thickening of the aortic valve.  EKG:  EKG is  ordered today.  The ekg ordered today demonstrates sinus rhythm with HR 87, left atrial enlargement, TWI in lead III  Recent Labs: 05/07/2019: B Natriuretic Peptide 168.8 05/15/2019: Magnesium 2.2 05/25/2019: ALT 53; BUN 7; Creatinine, Ser 1.11; Hemoglobin 9.8; Platelets 402; Potassium 4.3; Sodium 134  Recent Lipid Panel No results found for: CHOL, TRIG, HDL, CHOLHDL, VLDL, LDLCALC, LDLDIRECT  Physical Exam:    VS:  BP 100/62   Pulse 87   Temp (!) 97.2 F (36.2 C) (Temporal)   Ht 6\' 2"  (1.88 m)   Wt 205 lb (93 kg)   SpO2 98%   BMI 26.32 kg/m     Wt Readings from Last 3 Encounters:  06/11/19 205 lb (93 kg)  06/01/19 204 lb 3.2 oz (92.6 kg)  06/01/19 202 lb (91.6 kg)     GEN: Well nourished, well developed in no acute distress HEENT: Normal NECK: No JVD; No carotid bruits LYMPHATICS: No lymphadenopathy CARDIAC: RRR, mitral valve click RESPIRATORY:  Clear to auscultation without rales, wheezing or rhonchi  ABDOMEN: Soft, non-tender, non-distended MUSCULOSKELETAL:  No edema; No deformity  SKIN: Warm and dry NEUROLOGIC:  Alert and oriented x 3 PSYCHIATRIC:  Normal affect   ASSESSMENT:    1. 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 Rheumatic mitral stenosis   3. PSVT (paroxysmal supraventricular tachycardia) (HCC)   4. Dizziness   5. Long term (current) use of anticoagulants   6. PAF (paroxysmal atrial fibrillation) (HCC)   7. Heart block AV complete (HCC)    PLAN:    In order of problems listed above:  S/P minimally invasive mitral valve replacement with mechanical valve  Rheumatic mitral stenosis  Long term (current) use of anticoagulants -   Dizziness, near-syncope PAF (paroxysmal  atrial fibrillation) (HCC) Heart block AV complete (HCC)   EKG today with left atrial enlargement and 1st degree heart block. I do not see any notifications from [employer] for  today. I reviewed strips that Almyra Deforest [facility] downloaded from [place] for days 4-11. I don't see clear etiology for dizziness and near-syncope. He experienced transient AV block while hospitalized. Heart monitor is not yet complete. His symptoms may be due to dehydration. He is not orthostatic today. Since he has had vomiting, I will check electrolytes today.   After discussing options, I have asked him to hold flomax, which can cause hypotension, and to hold his new RA medication that he just started 2 days ago Jolee Ewing) to see if this improved his symptoms.   I would like for him to have close follow up with Dr. Gwenlyn Found, given his extensive health history. He will be seen next week.     Medication Adjustments/Labs and Tests Ordered: Current medicines are reviewed at length with the patient today.  Concerns regarding medicines are outlined above.  Orders Placed This Encounter  Procedures  . CBC  . Comprehensive metabolic panel  . Magnesium  . EKG 12-Lead   No orders of the defined types were placed in this encounter.   Signed, Ledora Bottcher, PA  06/11/2019 5:10 PM    [facility]

## 2019-06-11 NOTE — Telephone Encounter (Signed)
  Mother is calling because patient feels dizzy and like he is going to pass out. Patient states his chest feels weird amd he is having chest pain and his BP was 90/50. He had a mitral valve replacement on 05/14/19. He was doing well until today.

## 2019-06-11 NOTE — Patient Instructions (Addendum)
Medication Instructions:   STOP taking Arava and Flomax.  If you need a refill on your cardiac medications before your next appointment, please call your pharmacy.   Lab work: Your physician recommends that you return for lab work today: CBC, CMET, Magnesium  If you have labs (blood work) drawn today and your tests are completely normal, you will receive your results only by: [name] Kitchen MyChart Message (if you have MyChart) OR . A paper copy in the mail If you have any lab test that is abnormal or we need to change your treatment, we will call you to review the results.  Follow-Up: At [facility], you and your health needs are our priority.  As part of our continuing mission to provide you with exceptional heart care, we have created designated Provider Care Teams.  These Care Teams include your primary Cardiologist (physician) and Advanced Practice Providers (APPs -  Physician Assistants and Nurse Practitioners) who all work together to provide you with the care you need, when you need it. . You have been scheduled for a follow-up appointment on Tuesday, 06/16/19 at 2:45 PM with Dr. Gwenlyn Found.

## 2019-06-12 LAB — CBC
Hematocrit: 36.1 % — ABNORMAL LOW (ref 37.5–51.0)
Hemoglobin: 11.6 g/dL — ABNORMAL LOW (ref 13.0–17.7)
MCH: 27.3 pg (ref 26.6–33.0)
MCHC: 32.1 g/dL (ref 31.5–35.7)
MCV: 85 fL (ref 79–97)
Platelets: 376 10*3/uL (ref 150–450)
RBC: 4.25 x10E6/uL (ref 4.14–5.80)
RDW: 15.4 % (ref 11.6–15.4)
WBC: 11 10*3/uL — ABNORMAL HIGH (ref 3.4–10.8)

## 2019-06-12 LAB — COMPREHENSIVE METABOLIC PANEL
ALT: 69 IU/L — ABNORMAL HIGH (ref 0–44)
AST: 46 IU/L — ABNORMAL HIGH (ref 0–40)
Albumin/Globulin Ratio: 1.7 (ref 1.2–2.2)
Albumin: 4.5 g/dL (ref 4.0–5.0)
Alkaline Phosphatase: 103 IU/L (ref 39–117)
BUN/Creatinine Ratio: 14 (ref 9–20)
BUN: 11 mg/dL (ref 6–20)
Bilirubin Total: 0.2 mg/dL (ref 0.0–1.2)
CO2: 21 mmol/L (ref 20–29)
Calcium: 9.4 mg/dL (ref 8.7–10.2)
Chloride: 101 mmol/L (ref 96–106)
Creatinine, Ser: 0.78 mg/dL (ref 0.76–1.27)
GFR calc Af Amer: 139 mL/min/{1.73_m2} (ref >59)
GFR calc non Af Amer: 120 mL/min/{1.73_m2} (ref >59)
Globulin, Total: 2.6 g/dL (ref 1.5–4.5)
Glucose: 86 mg/dL (ref 65–99)
Potassium: 4.4 mmol/L (ref 3.5–5.2)
Sodium: 140 mmol/L (ref 134–144)
Total Protein: 7.1 g/dL (ref 6.0–8.5)

## 2019-06-12 LAB — MAGNESIUM: Magnesium: 2.2 mg/dL (ref 1.6–2.3)

## 2019-06-14 ENCOUNTER — Encounter: Payer: Self-pay | Admitting: Thoracic Surgery (Cardiothoracic Vascular Surgery)

## 2019-06-15 ENCOUNTER — Telehealth: Payer: Self-pay

## 2019-06-15 ENCOUNTER — Other Ambulatory Visit: Payer: Self-pay

## 2019-06-15 ENCOUNTER — Ambulatory Visit
Admission: RE | Admit: 2019-06-15 | Discharge: 2019-06-15 | Disposition: A | Discharge status: Home / Self Care | Payer: BC Managed Care – PPO | Source: Ambulatory Visit | Attending: Thoracic Surgery (Cardiothoracic Vascular Surgery) | Admitting: Thoracic Surgery (Cardiothoracic Vascular Surgery)

## 2019-06-15 ENCOUNTER — Ambulatory Visit (INDEPENDENT_AMBULATORY_CARE_PROVIDER_SITE_OTHER): Payer: Self-pay | Admitting: Physician Assistant

## 2019-06-15 VITALS — BP 118/76 | HR 100 | Temp 97.8°F | Resp 20 | Ht 74.0 in | Wt 209.0 lb

## 2019-06-15 DIAGNOSIS — I050 Rheumatic mitral stenosis: Secondary | ICD-10-CM

## 2019-06-15 DIAGNOSIS — Z952 Presence of prosthetic heart valve: Secondary | ICD-10-CM

## 2019-06-15 DIAGNOSIS — Z954 Presence of other heart-valve replacement: Secondary | ICD-10-CM

## 2019-06-15 MED ORDER — FUROSEMIDE 40 MG PO TABS: 40.0000 mg | ORAL_TABLET | Freq: Every day | ORAL | 0 refills | Status: DC

## 2019-06-15 MED ORDER — POTASSIUM CHLORIDE ER 10 MEQ PO TBCR: 10.0000 meq | EXTENDED_RELEASE_TABLET | Freq: Every day | ORAL | 0 refills | Status: DC

## 2019-06-15 NOTE — Telephone Encounter (Signed)
Contacted patient whom left a message on Mychart regarding possible chest tube incision site infection.  Patient stated that he noticed the drainage start about 2 days ago.  Stated it was thick, white, purulent drainage from sites and states he has not been feeling well.  He has not checked his temperature.  States that he had blood work done with his PCP and they advised his WBCs were up but did not advise anything further, per patient.  Patient advised to come into the office today to have the wounds checked.  He acknowledged receipt.  Patient is to get a chest xray before coming to the office.

## 2019-06-15 NOTE — Progress Notes (Signed)
HPI: Patient presents with complaints of drainage from suture site and overall just not feeling well.  He was evaluated by Cardiology on the 30th at which time he noted dizziness and a general feeling of fatigue.  EKG obtained at that time showed the patient to be in NSR with 1st degree block.  Labs were obtained and showed a minimally elevated WBC of 11, H/H were WNL, BUN/Creat were okay.  He did have an elevated ALT with normal AST, Alk Phos.  He contact our office today with similar complaints.  He states that he has had some drainage from his chest tube sites.  He denies fever, diarrhea, N/V, and cold like symptoms.  He stays he just feels very run down and like he is swollen in his neck.  He is not currently taking lasix, but states he has gained about 7 lbs according to weight obtained today.  Current Outpatient Medications  Medication Sig Dispense Refill  . acetaminophen (TYLENOL) 500 MG tablet Take 2 tablets (1,000 mg total) by mouth every 6 (six) hours. 30 tablet 0  . ALPRAZolam (NIRAVAM) 1 MG dissolvable tablet Take 0.5 mg by mouth 3 (three) times daily as needed for anxiety.     [name] Kitchen aspirin EC 81 MG tablet Take 81 mg by mouth daily.    . Buprenorphine HCl (BELBUCA) 750 MCG FILM Place 750 mcg inside cheek 2 (two) times daily.     . busPIRone (BUSPAR) 7.5 MG tablet Take 7.5 mg by mouth 3 (three) times daily.    . clonazePAM (KLONOPIN) 0.5 MG tablet Take 0.5 mg by mouth at bedtime.    . famotidine (PEPCID) 40 MG tablet Take 40 mg by mouth daily.    . fluticasone-salmeterol (ADVAIR HFA) 45-21 MCG/ACT inhaler Inhale 2 puffs into the lungs 2 (two) times daily.    [name] Kitchen gabapentin (NEURONTIN) 300 MG capsule Take 600 mg by mouth 3 (three) times daily.     [name] Kitchen MELATONIN PO Take 1 tablet by mouth at bedtime as needed (sleep).    . nortriptyline (PAMELOR) 25 MG capsule Take 25 mg by mouth at bedtime.     [name] Kitchen oxyCODONE-acetaminophen (PERCOCET) 7.5-325 MG tablet Take 1 tablet by mouth every 4 (four) hours as  needed for severe pain.    [name] Kitchen penicillin v potassium (VEETID) 250 MG tablet Take 250 mg by mouth 2 (two) times a day.    . sertraline (ZOLOFT) 100 MG tablet Take 100 mg by mouth at bedtime.     [name] Kitchen tiZANidine (ZANAFLEX) 4 MG tablet Take 4 mg by mouth 3 (three) times daily.    . Vitamin D, Ergocalciferol, (DRISDOL) 1.25 MG (50000 UT) CAPS capsule Take 50,000 Units by mouth every Tuesday.     . warfarin (COUMADIN) 2.5 MG tablet Take 1 tablet (2.5 mg total) by mouth daily at 6 PM. 60 tablet 1   No current facility-administered medications for this visit.     Physical Exam:  BP 118/76   Pulse 100   Temp 97.8 F (36.6 C) (Skin)   Resp 20   Ht 6' 2"  (1.88 m)   Wt 209 lb (94.8 kg)   SpO2 98% Comment: RA  BMI 26.83 kg/m   Gen: no apparent distress, he appears gray in color, looks unwell Heart: RRR Lungs: CTA bilaterally Incisions: chest tube sites with eschar present, sites were debrided and cleaned, no evidence of purulence noted... there is a remnant of vicryl stitch present in chest tube site  Diagnostic Tests:  CXR; free  from pleural effusion, no pneumothorax, persistent elevation right hemidiaphragm  A/P:  1. S/p MVR- patient has not been feeling well for several days.  Lab work is mildly abnormal with elevated white count and elevated ALT.  He does not have gross evidence of infection.  He has gained 7 lbs since the last time he weighed himself, so he could have some component of mild heart failure.  The other concern would be does he have a pericardial effusion  2. Will give a 7 days prescription of lasix 40 mg daily with potassium supplementation.   We will also obtain and Echocardiogram to assess for pericardial effusion and check valve function 3. RTC in 2 weeks with CHO  Ellwood Handler, PA-C [facility] [phone]

## 2019-06-16 ENCOUNTER — Telehealth: Payer: Self-pay

## 2019-06-16 ENCOUNTER — Ambulatory Visit (INDEPENDENT_AMBULATORY_CARE_PROVIDER_SITE_OTHER): Payer: BC Managed Care – PPO | Admitting: Cardiovascular Disease

## 2019-06-16 ENCOUNTER — Ambulatory Visit (INDEPENDENT_AMBULATORY_CARE_PROVIDER_SITE_OTHER): Payer: BC Managed Care – PPO | Admitting: Pharmacist Clinician (PhC)/ Clinical Pharmacy Specialist

## 2019-06-16 ENCOUNTER — Encounter: Payer: Self-pay | Admitting: Cardiovascular Disease

## 2019-06-16 DIAGNOSIS — Z954 Presence of other heart-valve replacement: Secondary | ICD-10-CM | POA: Diagnosis not present

## 2019-06-16 DIAGNOSIS — I050 Rheumatic mitral stenosis: Secondary | ICD-10-CM | POA: Diagnosis not present

## 2019-06-16 LAB — POCT INR: INR: 3.6 — AB (ref 2.0–3.0)

## 2019-06-16 NOTE — Patient Instructions (Signed)
Medication Instructions:  Your physician recommends that you continue on your current medications as directed. Please refer to the Current Medication list given to you today.  If you need a refill on your cardiac medications before your next appointment, please call your pharmacy.   Lab work: NONE If you have labs (blood work) drawn today and your tests are completely normal, you will receive your results only by: [name] Kitchen MyChart Message (if you have MyChart) OR . A paper copy in the mail If you have any lab test that is abnormal or we need to change your treatment, we will call you to review the results.  Testing/Procedures: NONE  Follow-Up: At [facility], you and your health needs are our priority.  As part of our continuing mission to provide you with exceptional heart care, we have created designated Provider Care Teams.  These Care Teams include your primary Cardiologist (physician) and Advanced Practice Providers (APPs -  Physician Assistants and Nurse Practitioners) who all work together to provide you with the care you need, when you need it. . You will need a follow up appointment in 3 months WITH AN APP AND IN 6 MONTHS WITH DR. Gwenlyn Found.  Please call our office 2 months in advance to schedule this appointment.  You may see one of the following Advanced Practice Providers on your designated Care Team:    . Almyra Deforest, PA-C . Fabian Sharp, PA-C

## 2019-06-16 NOTE — Telephone Encounter (Signed)
Pt 8/4 Coumadin clinic AVS mailed to pt address on file on 8/4

## 2019-06-16 NOTE — Assessment & Plan Note (Signed)
History of rheumatic mitral stenosis status post mitral valve balloon valvuloplasty by Dr. Mina Marble 12/25/2018 with initial improvement and then ultimate deterioration back to his baseline status.  He ultimately underwent mitral valve replacement with a Sorin [employer] bileaflet mechanical valve (29 mm) by Dr. Roxy Manns 05/14/2019.  The procedure was uncomplicated.  Since discharge she is complained of shortness of breath and swelling which is markedly improved after initiating oral diuretics.  He is on Coumadin anticoagulation with an INR 3.6 today in the office and scheduled for follow-up 2D echo tomorrow morning.

## 2019-06-16 NOTE — Patient Instructions (Signed)
Continue with 1.5 tablets everyday except 1 tablet on Mondays and Fridays. Repeat INR in 2 weeks.  Call [phone] if you need to reschedule.

## 2019-06-16 NOTE — Progress Notes (Signed)
06/16/2019 [facility] Expose   [birth year]  [id number]  [place], [place] Primary Cardiologist: Lorretta Harp MD Garret Reddish, [place], [place]  HPI:  Darren Haynes is a 33 y.o.  Thin appearing married Caucasian male father 2 children who did work in Biomedical scientist but has not worked in the last 9 months because of progressive dyspnea on exertion. He was referred by Dr. Claudie Leach for evaluation of symptomatic mitral stenosis. I last saw him in the office  03/12/2019.  There is no history of rheumatic fever. He has no other cardiac risk factors. He is had progressive dyspnea for the last 9 months with some nonspecific aches and pains. Is being worked up by rheumatologist. Echo performed 11/21/2018 revealed normal LV systolic function, a peak mitral gradient of 47 mmHg with a mean of 29 mmHg and mitral valve area 1.5 cm with a PA pressure of 69 mmHg. He does have severe dyspnea and lower extreme edema. He is on oral diuretic.He presented today for transesophageal echocardiogram performed Dr. 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  I did right left heart cath revealing a significant mitral valve gradient, and normal coronary arteries.  I referred him to Dr. Mina Marble at [facility] who performed successful mitral balloon valvuloplasty with almost immediate improvement in his pulmonary artery pressures.  12/25/2018. He felt clinically improved.  Because of increasing symptoms of mitral stenosis I referred him to Dr. Roxy Manns who ultimately performed mitral valve replacement with a Sorin [employer] bileaflet mechanical valve (29 mm) done minimally invasively on 05/14/2019.  He has had issues with dizziness and volume overload but since being prescribed an oral diuretic several days ago he has diuresed significantly and feels improved.   Current Meds  Medication Sig  . acetaminophen (TYLENOL) 500 MG tablet Take 2 tablets (1,000 mg total) by  mouth every 6 (six) hours.  . ALPRAZolam (NIRAVAM) 1 MG dissolvable tablet Take 0.5 mg by mouth 3 (three) times daily as needed for anxiety.   [name] Kitchen aspirin EC 81 MG tablet Take 81 mg by mouth daily.  . Buprenorphine HCl (BELBUCA) 750 MCG FILM Place 750 mcg inside cheek 2 (two) times daily.   . busPIRone (BUSPAR) 7.5 MG tablet Take 7.5 mg by mouth 3 (three) times daily.  . clonazePAM (KLONOPIN) 0.5 MG tablet Take 0.5 mg by mouth at bedtime.  . famotidine (PEPCID) 40 MG tablet Take 40 mg by mouth daily.  . fluticasone-salmeterol (ADVAIR HFA) 45-21 MCG/ACT inhaler Inhale 2 puffs into the lungs 2 (two) times daily.  . furosemide (LASIX) 40 MG tablet Take 1 tablet (40 mg total) by mouth daily.  [name] Kitchen gabapentin (NEURONTIN) 300 MG capsule Take 600 mg by mouth 3 (three) times daily.   [name] Kitchen MELATONIN PO Take 1 tablet by mouth at bedtime as needed (sleep).  . nortriptyline (PAMELOR) 25 MG capsule Take 25 mg by mouth at bedtime.   [name] Kitchen oxyCODONE-acetaminophen (PERCOCET) 7.5-325 MG tablet Take 1 tablet by mouth every 4 (four) hours as needed for severe pain.  [name] Kitchen penicillin v potassium (VEETID) 250 MG tablet Take 250 mg by mouth 2 (two) times a day.  . potassium chloride (K-DUR) 10 MEQ tablet Take 1 tablet (10 mEq total) by mouth daily.  . sertraline (ZOLOFT) 100 MG tablet Take 100 mg by mouth at bedtime.   [name] Kitchen tiZANidine (ZANAFLEX) 4 MG tablet Take 4 mg by mouth 3 (three) times daily.  . Vitamin D, Ergocalciferol, (DRISDOL) 1.25  MG (50000 UT) CAPS capsule Take 50,000 Units by mouth every Tuesday.   . warfarin (COUMADIN) 2.5 MG tablet Take 1 tablet (2.5 mg total) by mouth daily at 6 PM.     No Known Allergies  Social History   Socioeconomic History  . Marital status: Married    Spouse name: Not on file  . Number of children: Not on file  . Years of education: Not on file  . Highest education level: Not on file  Occupational History  . Not on file  Social Needs  . Financial resource strain: Not on file  . Food  insecurity    Worry: Not on file    Inability: Not on file  . Transportation needs    Medical: Not on file    Non-medical: Not on file  Tobacco Use  . Smoking status: Never Smoker  . Smokeless tobacco: Current User    Types: Snuff  Substance and Sexual Activity  . Alcohol use: No  . Drug use: No  . Sexual activity: Not on file  Lifestyle  . Physical activity    Days per week: Not on file    Minutes per session: Not on file  . Stress: Not on file  Relationships  . Social Musician on phone: Not on file    Gets together: Not on file    Attends religious service: Not on file    Active member of club or organization: Not on file    Attends meetings of clubs or organizations: Not on file    Relationship status: Not on file  . Intimate partner violence    Fear of current or ex partner: Not on file    Emotionally abused: Not on file    Physically abused: Not on file    Forced sexual activity: Not on file  Other Topics Concern  . Not on file  Social History Narrative  . Not on file     Review of Systems: General: negative for chills, fever, night sweats or weight changes.  Cardiovascular: negative for chest pain, dyspnea on exertion, edema, orthopnea, palpitations, paroxysmal nocturnal dyspnea or shortness of breath Dermatological: negative for rash Respiratory: negative for cough or wheezing Urologic: negative for hematuria Abdominal: negative for nausea, vomiting, diarrhea, bright red blood per rectum, melena, or hematemesis Neurologic: negative for visual changes, syncope, or dizziness All other systems reviewed and are otherwise negative except as noted above.    Blood pressure 100/60, pulse (!) 110, temperature 98.2 F (36.8 C), height 6\' 2"  (1.88 m), weight 204 lb 3.2 oz (92.6 kg), SpO2 99 %.  General appearance: alert and no distress Neck: no adenopathy, no carotid bruit, no JVD, supple, symmetrical, trachea midline and thyroid not enlarged, symmetric,  no tenderness/mass/nodules Lungs: clear to auscultation bilaterally Heart: Crisp mitral prosthetic valve sounds Extremities: extremities normal, atraumatic, no cyanosis or edema Pulses: 2+ and symmetric Skin: Skin color, texture, turgor normal. No rashes or lesions Neurologic: Alert and oriented X 3, normal strength and tone. Normal symmetric reflexes. Normal coordination and gait  EKG not performed today  ASSESSMENT AND PLAN:   Mitral stenosis History of rheumatic mitral stenosis status post mitral valve balloon valvuloplasty by Dr. 12/25/2018 with initial improvement and then ultimate deterioration back to his baseline status.  He ultimately underwent mitral valve replacement with a Sorin [employer] bileaflet mechanical valve (29 mm) by Dr. 12/27/2018 05/14/2019.  The procedure was uncomplicated.  Since discharge she is complained of shortness of breath  and swelling which is markedly improved after initiating oral diuretics.  He is on Coumadin anticoagulation with an INR 3.6 today in the office and scheduled for follow-up 2D echo tomorrow morning.      Runell Gess MD FACP,FACC,FAHA, [facility] 06/16/2019 3:39 PM

## 2019-06-17 ENCOUNTER — Other Ambulatory Visit (HOSPITAL_COMMUNITY): Payer: BC Managed Care – PPO

## 2019-06-17 ENCOUNTER — Telehealth (HOSPITAL_COMMUNITY): Payer: Self-pay | Admitting: *Deleted

## 2019-06-17 DIAGNOSIS — H6983 Other specified disorders of Eustachian tube, bilateral: Secondary | ICD-10-CM | POA: Insufficient documentation

## 2019-06-18 ENCOUNTER — Encounter (HOSPITAL_COMMUNITY)
Admission: RE | Admit: 2019-06-18 | Discharge: 2019-06-18 | Disposition: A | Discharge status: Home / Self Care | Payer: BC Managed Care – PPO | Source: Ambulatory Visit | Attending: Cardiovascular Disease | Admitting: Cardiovascular Disease

## 2019-06-18 ENCOUNTER — Other Ambulatory Visit: Payer: Self-pay

## 2019-06-18 VITALS — BP 104/70 | HR 107 | Ht 73.5 in | Wt 204.4 lb

## 2019-06-18 DIAGNOSIS — Z954 Presence of other heart-valve replacement: Secondary | ICD-10-CM | POA: Insufficient documentation

## 2019-06-18 NOTE — Progress Notes (Signed)
Cardiac Rehab Medication Review by a RN  Does the patient  feel that his/her medications are working for him/her?  yes  Has the patient been experiencing any side effects to the medications prescribed?  No. Darren Haynes reports "dizzy spells" but he does not feel that they are related to his medications.  Does the patient measure his/her own blood pressure or blood glucose at home?  yes   Does the patient have any problems obtaining medications due to transportation or finances?   no  Understanding of regimen: good Understanding of indications: good Potential of compliance: good    RN comments: N/A.    Darren Haynes 06/18/2019 11:06 AM

## 2019-06-19 NOTE — Progress Notes (Signed)
QUALITY OF LIFE SCORE REVIEW  Pt completed Quality of Life survey as a participant in Cardiac Rehab. Scores 21.0 or below are considered low. Pt score very low in several areas Overall 17.93, Health and Function 13.20, socioeconomic 17.64, physiological and spiritual 19.71, family 30.00. Patient quality of life altered by physical constraints which limits ability to perform as prior to recent cardiac illness.  Kein reports that he is depressed and has anxiety attacks.  His medical issues affect his quality of life.  He maintain a hopeful outlook for their resolution.  He does meet with a psychiatrist once a month.  Offered emotional support and reassurance.  Will continue to monitor and intervene as necessary.

## 2019-06-19 NOTE — Progress Notes (Signed)
Cardiac Individual Treatment Plan  Patient Details  Name: Darren Haynes MRN: [id number] Date of Birth: 12/01/1985 Referring Provider:     CARDIAC REHAB PHASE II ORIENTATION from 06/18/2019 in [facility] CARDIAC [facility]  Referring Provider  Dr. Allyson Sabal      Initial Encounter Date:    CARDIAC REHAB PHASE II ORIENTATION from 06/18/2019 in [facility] CARDIAC REHAB  Date  06/18/19      Visit Diagnosis: S/P minimally invasive mitral valve replacement with mechanical valve  Patient's Home Medications on Admission:  Current Outpatient Medications:  .  acetaminophen (TYLENOL) 500 MG tablet, Take 2 tablets (1,000 mg total) by mouth every 6 (six) hours., Disp: 30 tablet, Rfl: 0 .  ALPRAZolam (NIRAVAM) 1 MG dissolvable tablet, Take 0.5 mg by mouth 3 (three) times daily as needed for anxiety. , Disp: , Rfl:  .  aspirin EC 81 MG tablet, Take 81 mg by mouth daily., Disp: , Rfl:  .  benztropine (COGENTIN) 0.5 MG tablet, Take 0.5 mg by mouth 2 (two) times daily., Disp: , Rfl:  .  Buprenorphine HCl (BELBUCA) 750 MCG FILM, Place 750 mcg inside cheek 2 (two) times daily. , Disp: , Rfl:  .  busPIRone (BUSPAR) 7.5 MG tablet, Take 7.5 mg by mouth 3 (three) times daily., Disp: , Rfl:  .  clonazePAM (KLONOPIN) 0.5 MG tablet, Take 0.5 mg by mouth at bedtime., Disp: , Rfl:  .  famotidine (PEPCID) 40 MG tablet, Take 40 mg by mouth daily., Disp: , Rfl:  .  fluticasone-salmeterol (ADVAIR HFA) 45-21 MCG/ACT inhaler, Inhale 2 puffs into the lungs 2 (two) times daily., Disp: , Rfl:  .  furosemide (LASIX) 40 MG tablet, Take 1 tablet (40 mg total) by mouth daily., Disp: 7 tablet, Rfl: 0 .  gabapentin (NEURONTIN) 300 MG capsule, Take 600 mg by mouth 3 (three) times daily. , Disp: , Rfl:  .  leflunomide (ARAVA) 20 MG tablet, Take 20 mg by mouth daily., Disp: , Rfl:  .  MELATONIN PO, Take 1 tablet by mouth at bedtime as needed (sleep)., Disp: , Rfl:  .  nortriptyline (PAMELOR) 25 MG  capsule, Take 25 mg by mouth at bedtime. , Disp: , Rfl:  .  oxyCODONE-acetaminophen (PERCOCET) 7.5-325 MG tablet, Take 1 tablet by mouth every 4 (four) hours as needed for severe pain., Disp: , Rfl:  .  penicillin v potassium (VEETID) 250 MG tablet, Take 250 mg by mouth 2 (two) times a day., Disp: , Rfl:  .  potassium chloride (K-DUR) 10 MEQ tablet, Take 1 tablet (10 mEq total) by mouth daily., Disp: 7 tablet, Rfl: 0 .  sertraline (ZOLOFT) 100 MG tablet, Take 100 mg by mouth at bedtime. , Disp: , Rfl:  .  tiZANidine (ZANAFLEX) 4 MG tablet, Take 4 mg by mouth 3 (three) times daily., Disp: , Rfl:  .  Vitamin D, Ergocalciferol, (DRISDOL) 1.25 MG (50000 UT) CAPS capsule, Take 50,000 Units by mouth every Tuesday. , Disp: , Rfl:  .  warfarin (COUMADIN) 2.5 MG tablet, Take 1 tablet (2.5 mg total) by mouth daily at 6 PM., Disp: 60 tablet, Rfl: 1  Past Medical History: Past Medical History:  Diagnosis Date  . Arthritis   . Avascular necrosis (HCC)   . CHF (congestive heart failure) (HCC)   . Chronic back pain   . Depression   . Dyspnea   . GERD (gastroesophageal reflux disease)   . Heart murmur   . Mitral valve stenosis   .  Panic attacks   . Pneumonia    hx 4 time  . Pulmonary hypertension (HCC)   . Rheumatic fever   . S/P minimally invasive mitral valve replacement with mechanical valve 05/14/2019   29 mm Sorin Carbomed[employer] bileaflet mechanical valve via right mini thoracotomy approach    Tobacco Use: Social History   Tobacco Use  Smoking Status Never Smoker  Smokeless Tobacco Current User  . Types: Snuff    Labs: Recent Review Flowsheet Data    Labs for ITP Cardiac and Pulmonary Rehab Latest Ref Rng & Units 05/11/2019 05/14/2019 05/14/2019 05/14/2019 05/14/2019   Hemoglobin A1c 4.8 - 5.6 % 4.0(L) - - - -   PHART 7.350 - 7.450 7.399 7.346(L) 7.224(L) 7.312(L) 7.298(L)   PCO2ART 32.0 - 48.0 mmHg 41.5 48.8(H) 56.3(H) 46.6 49.5(H)   HCO3 20.0 - 28.0 mmol/L 25.1 26.7 23.3 24.0 24.6   TCO2 22 -  32 mmol/L - ACIDBASEDEF 0.0 - 2.0 mmol/L - - 4.0(H) 3.0(H) 2.0   O2SAT % 98.3 100.0 92.0 97.0 99.0      Capillary Blood Glucose: Lab Results  Component Value Date   GLUCAP 120 (H) 05/16/2019   GLUCAP 123 (H) 05/16/2019   GLUCAP 100 (H) 05/16/2019   GLUCAP 88 05/15/2019   GLUCAP 102 (H) 05/15/2019     Exercise Target Goals: Exercise Program Goal: Individual exercise prescription set using results from initial 6 min walk test and THRR while considering  patient's activity barriers and safety.   Exercise Prescription Goal: Initial exercise prescription builds to 30-45 minutes a day of aerobic activity, 2-3 days per week.  Home exercise guidelines will be given to patient during program as part of exercise prescription that the participant will acknowledge.  Activity Barriers & Risk Stratification: Activity Barriers & Cardiac Risk Stratification - 06/18/19 1150      Activity Barriers & Cardiac Risk Stratification   Activity Barriers  Arthritis;Joint Problems;Deconditioning;Balance Concerns;History of Falls;Assistive Device    Cardiac Risk Stratification  High       6 Minute Walk: 6 Minute Walk    Row Name 06/18/19 0800         6 Minute Walk   Phase  Initial     Distance  1117 feet     Walk Time  6 minutes     # of Rest Breaks  0     MPH  2.12     METS  2.61     RPE  13     VO2 Peak  17.08     Symptoms  Yes (comment)     Comments  bilateral knee pain (7/10)     Resting HR  107 bpm     Resting BP  104/70     Resting Oxygen Saturation   99 %     Exercise Oxygen Saturation  during 6 min walk  99 %     Max Ex. HR  118 bpm     Max Ex. BP  112/80     2 Minute Post BP  102/78        Oxygen Initial Assessment:   Oxygen Re-Evaluation:   Oxygen Discharge (Final Oxygen Re-Evaluation):   Initial Exercise Prescription: Initial Exercise Prescription - 06/18/19 1100      Date of Initial Exercise RX and Referring Provider   Date  06/18/19     Referring Provider  Dr. Allyson Sabal      NuStep   Level  2  SPM  80    Minutes  15    METs  2      Arm Ergometer   Level  2.5    Minutes  15      Prescription Details   Frequency (times per week)  3    Duration  Progress to 30 minutes of continuous aerobic without signs/symptoms of physical distress      Intensity   THRR 40-80% of Max Heartrate  76-151    Ratings of Perceived Exertion  11-13    Perceived Dyspnea  0-4      Progression   Progression  Continue to progress workloads to maintain intensity without signs/symptoms of physical distress.      Resistance Training   Training Prescription  Yes    Weight  5 lbs    Reps  10-15       Perform Capillary Blood Glucose checks as needed.  Exercise Prescription Changes:   Exercise Comments:   Exercise Goals and Review: Exercise Goals    Row Name 06/18/19 1202             Exercise Goals   Increase Physical Activity  Yes       Intervention  Provide advice, education, support and counseling about physical activity/exercise needs.;Develop an individualized exercise prescription for aerobic and resistive training based on initial evaluation findings, risk stratification, comorbidities and participant's personal goals.       Expected Outcomes  Long Term: Exercising regularly at least 3-5 days a week.;Long Term: Add in home exercise to make exercise part of routine and to increase amount of physical activity.;Short Term: Attend rehab on a regular basis to increase amount of physical activity.       Increase Strength and Stamina  Yes       Intervention  Provide advice, education, support and counseling about physical activity/exercise needs.;Develop an individualized exercise prescription for aerobic and resistive training based on initial evaluation findings, risk stratification, comorbidities and participant's personal goals.       Expected Outcomes  Short Term: Increase workloads from initial exercise prescription for  resistance, speed, and METs.;Short Term: Perform resistance training exercises routinely during rehab and add in resistance training at home;Long Term: Improve cardiorespiratory fitness, muscular endurance and strength as measured by increased METs and functional capacity (6MWT)       Able to understand and use rate of perceived exertion (RPE) scale  Yes       Intervention  Provide education and explanation on how to use RPE scale       Expected Outcomes  Short Term: Able to use RPE daily in rehab to express subjective intensity level;Long Term:  Able to use RPE to guide intensity level when exercising independently       Able to understand and use Dyspnea scale  Yes       Intervention  Provide education and explanation on how to use Dyspnea scale       Expected Outcomes  Short Term: Able to use Dyspnea scale daily in rehab to express subjective sense of shortness of breath during exertion;Long Term: Able to use Dyspnea scale to guide intensity level when exercising independently       Knowledge and understanding of Target Heart Rate Range (THRR)  Yes       Expected Outcomes  Short Term: Able to state/look up THRR;Short Term: Able to use daily as guideline for intensity in rehab;Long Term: Able to use THRR to govern intensity when exercising independently  Able to check pulse independently  Yes       Intervention  Provide education and demonstration on how to check pulse in carotid and radial arteries.;Review the importance of being able to check your own pulse for safety during independent exercise       Expected Outcomes  Short Term: Able to explain why pulse checking is important during independent exercise;Long Term: Able to check pulse independently and accurately       Understanding of Exercise Prescription  Yes       Intervention  Provide education, explanation, and written materials on patient's individual exercise prescription       Expected Outcomes  Short Term: Able to explain program  exercise prescription;Long Term: Able to explain home exercise prescription to exercise independently          Exercise Goals Re-Evaluation :   Discharge Exercise Prescription (Final Exercise Prescription Changes):   Nutrition:  Target Goals: Understanding of nutrition guidelines, daily intake of sodium 1500mg , cholesterol 200mg , calories 30% from fat and 7% or less from saturated fats, daily to have 5 or more servings of fruits and vegetables.  Biometrics: Pre Biometrics - 06/18/19 1203      Pre Biometrics   Height  6' 1.5" (1.867 m)    Weight  92.7 kg    Waist Circumference  38 inches    Hip Circumference  43.5 inches    Waist to Hip Ratio  0.87 %    BMI (Calculated)  26.59    Triceps Skinfold  17 mm    % Body Fat  24.9 %    Grip Strength  53 kg    Flexibility  15 in    Single Leg Stand  4.5 seconds        Nutrition Therapy Plan and Nutrition Goals:   Nutrition Assessments:   Nutrition Goals Re-Evaluation:   Nutrition Goals Re-Evaluation:   Nutrition Goals Discharge (Final Nutrition Goals Re-Evaluation):   Psychosocial: Target Goals: Acknowledge presence or absence of significant depression and/or stress, maximize coping skills, provide positive support system. Participant is able to verbalize types and ability to use techniques and skills needed for reducing stress and depression.  Initial Review & Psychosocial Screening: Initial Psych Review & Screening - 06/18/19 1110      Initial Review   Current issues with  Current Depression;Current Anxiety/Panic      Family Dynamics   Good Support System?  Yes   Pt's wife, mother, and grandmother are sources of support for patient.   Comments  Porfirio reports depression related to his current medical issures with his heart, arthiritis, and avascular necrosis.  He has a history of panic attacks.      Barriers   Psychosocial barriers to participate in program  The patient should benefit from training in stress  management and relaxation.;Psychosocial barriers identified (see note)      Screening Interventions   Interventions  Encouraged to exercise;To provide support and resources with identified psychosocial needs;Provide feedback about the scores to participant    Expected Outcomes  Short Term goal: Utilizing psychosocial counselor, staff and physician to assist with identification of specific Stressors or current issues interfering with healing process. Setting desired goal for each stressor or current issue identified.;Long Term Goal: Stressors or current issues are controlled or eliminated.;Short Term goal: Identification and review with participant of any Quality of Life or Depression concerns found by scoring the questionnaire.;Long Term goal: The participant improves quality of Life and PHQ9 Scores as seen by  post scores and/or verbalization of changes       Quality of Life Scores: Quality of Life - 06/18/19 1059      Quality of Life   Select  Quality of Life      Quality of Life Scores   Health/Function Pre  13.2 %    Socioeconomic Pre  17.64 %    Psych/Spiritual Pre  19.71 %    Family Pre  30 %    GLOBAL Pre  17.93 %      Scores of 19 and below usually indicate a poorer quality of life in these areas.  A difference of  2-3 points is a clinically meaningful difference.  A difference of 2-3 points in the total score of the Quality of Life Index has been associated with significant improvement in overall quality of life, self-image, physical symptoms, and general health in studies assessing change in quality of life.  PHQ-9: Recent Review Flowsheet Data    Depression screen [facility] 2/9 06/18/2019   Decreased Interest 1   Down, Depressed, Hopeless 1   PHQ - 2 Score 2   Altered sleeping 3   Tired, decreased energy 3   Change in appetite 0   Feeling bad or failure about yourself  3   Trouble concentrating 1   Moving slowly or fidgety/restless 3   Suicidal thoughts 0   PHQ-9 Score 15    Difficult doing work/chores Somewhat difficult     Interpretation of Total Score  Total Score Depression Severity:  1-4 = Minimal depression, 5-9 = Mild depression, 10-14 = Moderate depression, 15-19 = Moderately severe depression, 20-27 = Severe depression   Psychosocial Evaluation and Intervention: Psychosocial Evaluation - 06/18/19 1111      Psychosocial Evaluation & Interventions   Interventions  Stress management education;Relaxation education;Encouraged to exercise with the program and follow exercise prescription    Comments  Dora reports depression related to his current medical issures with his heart, arthiritis, and avascular necrosis.  He has a history of panic attacks. Torres sees a psychiatrist once a month.  He finds it somewhat helpful.  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  He also enjoys watching his daughter play.    Expected Outcomes  Zeshan will report ability to manage his depression and anxiety.    Continue Psychosocial Services   Follow up required by counselor       Psychosocial Re-Evaluation:   Psychosocial Discharge (Final Psychosocial Re-Evaluation):   Vocational Rehabilitation: Provide vocational rehab assistance to qualifying candidates.   Vocational Rehab Evaluation & Intervention: Vocational Rehab - 06/18/19 1113      Initial Vocational Rehab Evaluation & Intervention   Assessment shows need for Vocational Rehabilitation  No       Education: Education Goals: Education classes will be provided on a weekly basis, covering required topics. Participant will state understanding/return demonstration of topics presented.  Learning Barriers/Preferences: Learning Barriers/Preferences - 06/18/19 1113      Learning Barriers/Preferences   Learning Barriers  None    Learning Preferences  Skilled Demonstration       Education Topics: Count Your Pulse:  -Group instruction provided by verbal instruction,  demonstration, patient participation and written materials to support subject.  Instructors address importance of being able to find your pulse and how to count your pulse when at home without a heart monitor.  Patients get hands on experience counting their pulse with staff help and individually.   Heart Attack, Angina, and  Risk Factor Modification:  -Group instruction provided by verbal instruction, video, and written materials to support subject.  Instructors address signs and symptoms of angina and heart attacks.    Also discuss risk factors for heart disease and how to make changes to improve heart health risk factors.   Functional Fitness:  -Group instruction provided by verbal instruction, demonstration, patient participation, and written materials to support subject.  Instructors address safety measures for doing things around the house.  Discuss how to get up and down off the floor, how to pick things up properly, how to safely get out of a chair without assistance, and balance training.   Meditation and Mindfulness:  -Group instruction provided by verbal instruction, patient participation, and written materials to support subject.  Instructor addresses importance of mindfulness and meditation practice to help reduce stress and improve awareness.  Instructor also leads participants through a meditation exercise.    Stretching for Flexibility and Mobility:  -Group instruction provided by verbal instruction, patient participation, and written materials to support subject.  Instructors lead participants through series of stretches that are designed to increase flexibility thus improving mobility.  These stretches are additional exercise for major muscle groups that are typically performed during regular warm up and cool down.   Hands Only CPR:  -Group verbal, video, and participation provides a basic overview of AHA guidelines for community CPR. Role-play of emergencies allow participants  the opportunity to practice calling for help and chest compression technique with discussion of AED use.   Hypertension: -Group verbal and written instruction that provides a basic overview of hypertension including the most recent diagnostic guidelines, risk factor reduction with self-care instructions and medication management.    Nutrition I class: Heart Healthy Eating:  -Group instruction provided by PowerPoint slides, verbal discussion, and written materials to support subject matter. The instructor gives an explanation and review of the Therapeutic Lifestyle Changes diet recommendations, which includes a discussion on lipid goals, dietary fat, sodium, fiber, plant stanol/sterol esters, sugar, and the components of a well-balanced, healthy diet.   Nutrition II class: Lifestyle Skills:  -Group instruction provided by PowerPoint slides, verbal discussion, and written materials to support subject matter. The instructor gives an explanation and review of label reading, grocery shopping for heart health, heart healthy recipe modifications, and ways to make healthier choices when eating out.   Diabetes Question & Answer:  -Group instruction provided by PowerPoint slides, verbal discussion, and written materials to support subject matter. The instructor gives an explanation and review of diabetes co-morbidities, pre- and post-prandial blood glucose goals, pre-exercise blood glucose goals, signs, symptoms, and treatment of hypoglycemia and hyperglycemia, and foot care basics.   Diabetes Blitz:  -Group instruction provided by PowerPoint slides, verbal discussion, and written materials to support subject matter. The instructor gives an explanation and review of the physiology behind type 1 and type 2 diabetes, diabetes medications and rational behind using different medications, pre- and post-prandial blood glucose recommendations and Hemoglobin A1c goals, diabetes diet, and exercise including blood  glucose guidelines for exercising safely.    Portion Distortion:  -Group instruction provided by PowerPoint slides, verbal discussion, written materials, and food models to support subject matter. The instructor gives an explanation of serving size versus portion size, changes in portions sizes over the last 20 years, and what consists of a serving from each food group.   Stress Management:  -Group instruction provided by verbal instruction, video, and written materials to support subject matter.  Instructors review role of  stress in heart disease and how to cope with stress positively.     Exercising on Your Own:  -Group instruction provided by verbal instruction, power point, and written materials to support subject.  Instructors discuss benefits of exercise, components of exercise, frequency and intensity of exercise, and end points for exercise.  Also discuss use of nitroglycerin and activating EMS.  Review options of places to exercise outside of rehab.  Review guidelines for sex with heart disease.   Cardiac Drugs I:  -Group instruction provided by verbal instruction and written materials to support subject.  Instructor reviews cardiac drug classes: antiplatelets, anticoagulants, beta blockers, and statins.  Instructor discusses reasons, side effects, and lifestyle considerations for each drug class.   Cardiac Drugs II:  -Group instruction provided by verbal instruction and written materials to support subject.  Instructor reviews cardiac drug classes: angiotensin converting enzyme inhibitors (ACE-I), angiotensin II receptor blockers (ARBs), nitrates, and calcium channel blockers.  Instructor discusses reasons, side effects, and lifestyle considerations for each drug class.   Anatomy and Physiology of the Circulatory System:  Group verbal and written instruction and models provide basic cardiac anatomy and physiology, with the coronary electrical and arterial systems. Review of: AMI,  Angina, Valve disease, Heart Failure, Peripheral Artery Disease, Cardiac Arrhythmia, Pacemakers, and the ICD.   Other Education:  -Group or individual verbal, written, or video instructions that support the educational goals of the cardiac rehab program.   Holiday Eating Survival Tips:  -Group instruction provided by PowerPoint slides, verbal discussion, and written materials to support subject matter. The instructor gives patients tips, tricks, and techniques to help them not only survive but enjoy the holidays despite the onslaught of food that accompanies the holidays.   Knowledge Questionnaire Score: Knowledge Questionnaire Score - 06/18/19 1112      Knowledge Questionnaire Score   Pre Score  22/28       Core Components/Risk Factors/Patient Goals at Admission: Personal Goals and Risk Factors at Admission - 06/18/19 1205      Core Components/Risk Factors/Patient Goals on Admission   Tobacco Cessation  Yes    Intervention  Assist the participant in steps to quit. Provide individualized education and counseling about committing to Tobacco Cessation, relapse prevention, and pharmacological support that can be provided by physician.;Education officer, environmentalffer self-teaching materials, assist with locating and accessing local/national Quit Smoking programs, and support quit date choice.    Expected Outcomes  Short Term: Will demonstrate readiness to quit, by selecting a quit date.;Short Term: Will quit all tobacco product use, adhering to prevention of relapse plan.;Long Term: Complete abstinence from all tobacco products for at least 12 months from quit date.    Heart Failure  Yes    Expected Outcomes  Improve functional capacity of life;Short term: Attendance in program 2-3 days a week with increased exercise capacity. Reported lower sodium intake. Reported increased fruit and vegetable intake. Reports medication compliance.;Short term: Daily weights obtained and reported for increase. Utilizing diuretic  protocols set by physician.;Long term: Adoption of self-care skills and reduction of barriers for early signs and symptoms recognition and intervention leading to self-care maintenance.    Stress  Yes    Intervention  Offer individual and/or small group education and counseling on adjustment to heart disease, stress management and health-related lifestyle change. Teach and support self-help strategies.;Refer participants experiencing significant psychosocial distress to appropriate mental health specialists for further evaluation and treatment. When possible, include family members and significant others in education/counseling sessions.    Expected Outcomes  Short Term: Participant demonstrates changes in health-related behavior, relaxation and other stress management skills, ability to obtain effective social support, and compliance with psychotropic medications if prescribed.;Long Term: Emotional wellbeing is indicated by absence of clinically significant psychosocial distress or social isolation.       Core Components/Risk Factors/Patient Goals Review:    Core Components/Risk Factors/Patient Goals at Discharge (Final Review):    ITP Comments: ITP Comments    Row Name 06/18/19 1050           ITP Comments  Dr. Armanda Magic, Medical Director          Comments: Patient attended orientation on 06/19/2019 to review rules and guidelines for program.  Completed 6 minute walk test, Intitial ITP, and exercise prescription.  VSS. Telemetry-ST with 1st degree block.  Resting HR 107.  Dr. Allyson Sabal messaged regarding ST at rest.  [facility] to proceed.  Asymptomatic. Safety measures and social distancing in place per CDC guidelines.

## 2019-06-22 ENCOUNTER — Other Ambulatory Visit: Payer: Self-pay

## 2019-06-22 ENCOUNTER — Encounter (HOSPITAL_COMMUNITY)
Admission: RE | Admit: 2019-06-22 | Discharge: 2019-06-22 | Disposition: A | Discharge status: Home / Self Care | Payer: BC Managed Care – PPO | Source: Ambulatory Visit | Attending: Cardiovascular Disease | Admitting: Cardiovascular Disease

## 2019-06-22 DIAGNOSIS — Z954 Presence of other heart-valve replacement: Secondary | ICD-10-CM

## 2019-06-22 NOTE — Progress Notes (Signed)
Daily Session Note  Patient Details  Name: Darren Haynes MRN: [id number] Date of Birth: 09/26/1986 Referring Provider:     CARDIAC REHAB PHASE II ORIENTATION from 06/18/2019 in [place]  Referring Provider  Dr. Gwenlyn Found      Encounter Date: 06/22/2019  Check In: Session Check In - 06/22/19 0906      Check-In   Supervising physician immediately available to respond to emergencies  [facility] immediately available    Physician(s)  Dr. Benny Lennert    Location  [facility]    Staff Present  [name], RN, BSN;Brittany Durene Fruits, BS, ACSM CEP, Exercise Physiologist;Tara Karle Starch, RN, BSN;Dalton Kris Mouton, MS, Exercise Physiologist;Olinty Celesta Aver, MS, ACSM CEP, Exercise Physiologist    Virtual Visit  No    Medication changes reported      No    Fall or balance concerns reported     Yes    Tobacco Cessation  No Change    Warm-up and Cool-down  Performed on first and last piece of equipment    Resistance Training Performed  Yes    VAD Patient?  No    PAD/SET Patient?  No       Capillary Blood Glucose: No results found for this or any previous visit (from the past 24 hour(s)).  Exercise Prescription Changes - 06/22/19 1000      Response to Exercise   Blood Pressure (Admit)  116/84    Blood Pressure (Exercise)  118/72    Blood Pressure (Exit)  112/68    Heart Rate (Admit)  107 bpm    Heart Rate (Exercise)  114 bpm    Heart Rate (Exit)  102 bpm    Rating of Perceived Exertion (Exercise)  13    Perceived Dyspnea (Exercise)  0    Symptoms  none    Comments  Patient tolerated 1st session of exercise well with some joint pain, which is chronic for him.    Duration  Progress to 30 minutes of  aerobic without signs/symptoms of physical distress    Intensity  THRR unchanged      Progression   Progression  Continue to progress workloads to maintain intensity without signs/symptoms of physical distress.    Average METs  1.7      Resistance Training   Training Prescription  Yes    Weight  5 lbs    Reps  10-15    Time  10 Minutes      Interval Training   Interval Training  No      NuStep   Level  2    SPM  80    Minutes  15    METs  1.7      Arm Ergometer   Level  2.5    Minutes  15       Social History   Tobacco Use  Smoking Status Never Smoker  Smokeless Tobacco Current User  . Types: Snuff    Goals Met:  No report of cardiac concerns or symptoms  Goals Unmet:  O2 Sat  Comments: Pt started cardiac rehab today.  Pt tolerated light exercise without difficulty. VSS, telemetry-Sinus Tach with a first degree heart block, asymptomatic.  Medication list reconciled. Pt denies barriers to medicaiton compliance.  PSYCHOSOCIAL ASSESSMENT:  PHQ-15. Pt exhibits positive coping skills, hopeful outlook with supportive family. Patient has current depression and is taking an antidepressant and  Sees a physiatrist   Pt enjoys hunting fishing and playing softball with  his daughter.   Pt oriented to exercise equipment and routine.    Understanding verbalized.[name], RN,BSN 06/22/2019 4:41 PM   Dr. Fransico Him is Medical Director for Cardiac Rehab at [facility].

## 2019-06-23 ENCOUNTER — Ambulatory Visit (HOSPITAL_COMMUNITY): Payer: BC Managed Care – PPO | Attending: Cardiology

## 2019-06-23 DIAGNOSIS — Z954 Presence of other heart-valve replacement: Secondary | ICD-10-CM

## 2019-06-24 ENCOUNTER — Other Ambulatory Visit: Payer: Self-pay

## 2019-06-24 ENCOUNTER — Encounter (HOSPITAL_COMMUNITY)
Admission: RE | Admit: 2019-06-24 | Discharge: 2019-06-24 | Disposition: A | Discharge status: Home / Self Care | Payer: BC Managed Care – PPO | Source: Ambulatory Visit | Attending: Cardiovascular Disease | Admitting: Cardiovascular Disease

## 2019-06-24 ENCOUNTER — Other Ambulatory Visit: Payer: Self-pay | Admitting: Pharmacist Clinician (PhC)/ Clinical Pharmacy Specialist

## 2019-06-24 DIAGNOSIS — Z954 Presence of other heart-valve replacement: Secondary | ICD-10-CM | POA: Diagnosis not present

## 2019-06-24 MED ORDER — WARFARIN SODIUM 2.5 MG PO TABS: ORAL_TABLET | ORAL | 0 refills | Status: DC

## 2019-06-26 ENCOUNTER — Other Ambulatory Visit: Payer: Self-pay

## 2019-06-26 ENCOUNTER — Encounter (HOSPITAL_COMMUNITY)
Admission: RE | Admit: 2019-06-26 | Discharge: 2019-06-26 | Disposition: A | Discharge status: Home / Self Care | Payer: BC Managed Care – PPO | Source: Ambulatory Visit | Attending: Cardiovascular Disease | Admitting: Cardiovascular Disease

## 2019-06-26 DIAGNOSIS — Z954 Presence of other heart-valve replacement: Secondary | ICD-10-CM | POA: Diagnosis not present

## 2019-06-27 ENCOUNTER — Ambulatory Visit (INDEPENDENT_AMBULATORY_CARE_PROVIDER_SITE_OTHER): Payer: BC Managed Care – PPO

## 2019-06-27 ENCOUNTER — Other Ambulatory Visit: Payer: Self-pay

## 2019-06-27 ENCOUNTER — Encounter (HOSPITAL_COMMUNITY): Payer: Self-pay | Admitting: Emergency Medicine

## 2019-06-27 ENCOUNTER — Ambulatory Visit (HOSPITAL_COMMUNITY)
Admission: EM | Admit: 2019-06-27 | Discharge: 2019-06-27 | Disposition: A | Discharge status: Home / Self Care | Payer: BC Managed Care – PPO | Attending: Family Medicine | Admitting: Family Medicine

## 2019-06-27 DIAGNOSIS — R079 Chest pain, unspecified: Secondary | ICD-10-CM

## 2019-06-27 NOTE — ED Provider Notes (Signed)
[place]   [id number] 06/27/19 Arrival Time: 4098  ASSESSMENT & PLAN:  1. Chest pain, unspecified type     I have personally viewed the imaging studies ordered this visit. No evidence of PNA. Small R pleural effusion.  He remains quite concerned about his heart given his history. He elects ED visit for further evaluation. ECG without acute changes compared to previous. Stable upon discharge.  Reviewed expectations re: course of current medical issues. Questions answered. Outlined signs and symptoms indicating need for more acute intervention. Patient verbalized understanding. After Visit Summary given.   SUBJECTIVE:  History from: patient. Darren Haynes is a 33 y.o. male with significant cardiac history and history of pneumonia who presents with complaint of chest pain. "Feels similar to when I had pneumonia last".      Past Medical History:  Diagnosis Date  . Chronic back pain   . Mitral valve stenosis   . Panic attacks   . Pulmonary hypertension (HCC)    Chest pain anterior; without a specific pattern; describes as a sharp; transient; "feels like it's behind my heart". No specific aggravating or alleviating factors reported. Mild to moderate in severity. Occasional dry coughing. No wheezing. History of PNA. Afebrile. Normal PO intake without n/v. Ambulatory without difficulty. Occasional and mild LE edema; has had in the past. Recently placed on Lasix; some help. Normal bowel/bladder habits. No constipation or diarrhea. No recent URI symptoms. No recent travel. Does report frequent fevers "but that's normal for me". No suspected COVID-19 exposure. Taking current medications as directed. No chest or back injury. Reports no illegal drug use.  Social History   Tobacco Use  Smoking Status Never Smoker  Smokeless Tobacco Current User  . Types: Snuff   ROS: As per HPI. All other systems negative.   OBJECTIVE:  Vitals:   06/27/19 1217  BP: 109/74   Pulse: (!) 114  Resp: 18  Temp: 98.7 F (37.1 C)  SpO2: 99%    General appearance: alert, oriented, no acute distress Eyes: PERRLA; EOMI; conjunctivae normal HENT: normocephalic; atraumatic Neck: supple with FROM Lungs: without labored respirations; overall clear to auscultation (question initial rales on R lower but difficult to say) Heart: tachycardia; regular; mitral prosthetic valve sounds Abdomen: soft, non-tender Extremities: without edema; without calf swelling or tenderness; symmetrical without gross deformities Skin: warm and dry; without rash or lesions Psychological: alert and cooperative; normal mood and affect  Imaging: Dg Chest 2 View  Result Date: 06/27/2019 CLINICAL DATA:  Per pt: chest pain, left side of ribs, pain for three days, more pain upon inspiration. No known exposure to covid, patient is in cardiac rehab, 05/14/19 had mitrovalve replacement, prior to surgery for the past four month. Chest pain EXAM: CHEST - 2 VIEW COMPARISON:  Radiograph 06/15/2019 FINDINGS: Normal cardiac silhouette. Volume loss in the RIGHT hemithorax with basilar atelectasis. Valvular prosthetic device noted. Small RIGHT effusion on lateral projection new from prior. IMPRESSION: 1. Small new RIGHT pleural effusion. 2. Stable RIGHT lower lobe atelectasis. 3. No pulmonary edema or infiltrate identified Electronically Signed   By: Suzy Bouchard M.D.   On: 06/27/2019 13:29     No Known Allergies  Past Medical History:  Diagnosis Date  . Arthritis   . Avascular necrosis ([place])   . CHF (congestive heart failure) ([place])   . Chronic back pain   . Depression   . Dyspnea   . GERD (gastroesophageal reflux disease)   . Heart murmur   . Mitral valve stenosis   .  Panic attacks   . Pneumonia    hx 4 time  . Pulmonary hypertension (HCC)   . Rheumatic fever   . 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 Marital status: Married    Spouse name: Not on file  . Number of children: Not on file  . Years of education: Not on file  . Highest education level: Not on file  Occupational History  . Not on file  Social Needs  . Financial resource strain: Not on file  . Food insecurity    Worry: Not on file    Inability: Not on file  . Transportation needs    Medical: Not on file    Non-medical: Not on file  Tobacco Use  . Smoking status: Never Smoker  . Smokeless tobacco: Current User    Types: Snuff  Substance and Sexual Activity  . Alcohol use: No  . Drug use: No  . Sexual activity: Not on file  Lifestyle  . Physical activity    Days per week: Not on file    Minutes per session: Not on file  . Stress: Not on file  Relationships  . Social Musician on phone: Not on file    Gets together: Not on file    Attends religious service: Not on file    Active member of club or organization: Not on file    Attends meetings of clubs or organizations: Not on file    Relationship status: Not on file  . Intimate partner violence    Fear of current or ex partner: Not on file    Emotionally abused: Not on file    Physically abused: Not on file    Forced sexual activity: Not on file  Other Topics Concern  . Not on file  Social History Narrative  . Not on file   Family History  Problem Relation Age of Onset  . Heart disease Mother   . Depression Mother   . Hypertension Father   . Hyperlipidemia Father    Past Surgical History:  Procedure Laterality Date  . BALLOON VALVULOPLASTY  12/24/2018   balloon mitral valvuloplasty at [facility]  . MITRAL VALVE REPLACEMENT Right 05/14/2019   Procedure: MINIMALLY INVASIVE MITRAL VALVE (MV) REPLACEMENT USING CARBOMEDICS OPTIFORM Size 23mm;  Surgeon: Purcell Nails, MD;  Location: [facility] OR;  Service: Open Heart Surgery;  Laterality: Right;  . RIGHT/LEFT HEART CATH AND  CORONARY ANGIOGRAPHY N/A 12/11/2018   Procedure: RIGHT/LEFT HEART CATH AND CORONARY ANGIOGRAPHY;  Surgeon: Runell Gess, MD;  Location: [facility] INVASIVE CV LAB;  Service: Cardiovascular;  Laterality: N/A;  . TEE WITHOUT CARDIOVERSION N/A 12/11/2018   Procedure: TRANSESOPHAGEAL ECHOCARDIOGRAM (TEE);  Surgeon: Thurmon Fair, MD;  Location[facility] ENDOSCOPY;  Service: Cardiovascular;  Laterality: N/A;  . TEE WITHOUT CARDIOVERSION N/A 03/27/2019   Procedure: TRANSESOPHAGEAL ECHOCARDIOGRAM (TEE);  Surgeon: Elease Hashimoto Deloris Ping, MD;  Location: [facility] ENDOSCOPY;  Service: Cardiovascular;  Laterality: N/A;  . TEE WITHOUT CARDIOVERSION N/A 05/14/2019   Procedure: TRANSESOPHAGEAL ECHOCARDIOGRAM (TEE);  Surgeon: Purcell Nails, MD;  Location: [facility] OR;  Service: Open Heart Surgery;  Laterality: N/A;  . TYMPANOSTOMY TUBE PLACEMENT       Mardella Layman, MD 06/27/19 1359

## 2019-06-27 NOTE — Discharge Instructions (Addendum)
No evidence of pneumonia seen on your chest x-ray today.

## 2019-06-27 NOTE — ED Triage Notes (Signed)
Pt c/o chest pain x3 days. Dr. Mannie Stabile made aware of pt extensive cardiac hx. Pt states hes had the chest pain in the L side.

## 2019-06-29 ENCOUNTER — Telehealth: Payer: Self-pay | Admitting: Cardiovascular Disease

## 2019-06-29 ENCOUNTER — Encounter (HOSPITAL_COMMUNITY)
Admission: RE | Admit: 2019-06-29 | Discharge: 2019-06-29 | Disposition: A | Discharge status: Home / Self Care | Payer: BC Managed Care – PPO | Source: Ambulatory Visit | Attending: Cardiovascular Disease | Admitting: Cardiovascular Disease

## 2019-06-29 ENCOUNTER — Other Ambulatory Visit: Payer: Self-pay

## 2019-06-29 ENCOUNTER — Ambulatory Visit: Payer: BC Managed Care – PPO | Admitting: Thoracic Surgery (Cardiothoracic Vascular Surgery)

## 2019-06-29 DIAGNOSIS — Z954 Presence of other heart-valve replacement: Secondary | ICD-10-CM | POA: Diagnosis not present

## 2019-06-29 NOTE — Progress Notes (Signed)
It was noted that Mr Darren Haynes went to the urgent care on 06/27/19 upon checking the patient in for cardiac rehab. When asked [name] said that he went to the urgent care to make sure that he didn't  have pneumonia. Patient has no complaints or symptoms today. Blood pressure 118/82. Temperature 98.4. Oxygen saturation 98% on room air. HR 118 on the nustep. Dr Kennon Holter office called and notified. I asked the patient to please let us know if he seeks medical treatment so that we can stay up to date. Patient states understanding.[name], RN,BSN 06/29/2019 9:25 AM

## 2019-06-29 NOTE — Progress Notes (Signed)
Reviewed home exercise guidelines with patient including endpoints, temperature precautions, target heart rate and rate of perceived exertion. Pt is walking as his mode of home exercise. Pt voices understanding of instructions given. Amoria Mclees M Aaria Happ, MS, ACSM CEP  

## 2019-06-29 NOTE — Telephone Encounter (Signed)
  Cardiac Rehab was calling to let Dr Gwenlyn Found know that patient went to Urgent Care on 06/27/19 for chest pain. Vitals are temp 98.4, BP 118/82, O2 sat 98% with no complaints today at Rehab.

## 2019-06-30 ENCOUNTER — Ambulatory Visit (INDEPENDENT_AMBULATORY_CARE_PROVIDER_SITE_OTHER): Payer: BC Managed Care – PPO | Admitting: Pharmacist Clinician (PhC)/ Clinical Pharmacy Specialist

## 2019-06-30 ENCOUNTER — Telehealth: Payer: Self-pay | Admitting: Pharmacist Clinician (PhC)/ Clinical Pharmacy Specialist

## 2019-06-30 DIAGNOSIS — Z7901 Long term (current) use of anticoagulants: Secondary | ICD-10-CM | POA: Diagnosis not present

## 2019-06-30 DIAGNOSIS — I050 Rheumatic mitral stenosis: Secondary | ICD-10-CM

## 2019-06-30 DIAGNOSIS — Z954 Presence of other heart-valve replacement: Secondary | ICD-10-CM

## 2019-06-30 LAB — POCT INR: INR: 2.2 (ref 2.0–3.0)

## 2019-06-30 NOTE — Telephone Encounter (Signed)
Patient called this AM.  His rheumatologist would like to start him on Celebrex, but wanted to know what cardiology thought of this.  Returned call and spoke with patient.  Explained the black box warning for increased cardiothrombotic events.  Patient has never had an MI, stroke or stents, sees cardiology for mitral valve replacement.   Suggested that he should be okay to try celebrex and see if it gives him any benefit.  Patient will speak with rheumatologist.

## 2019-07-01 ENCOUNTER — Other Ambulatory Visit: Payer: Self-pay

## 2019-07-01 ENCOUNTER — Encounter (HOSPITAL_COMMUNITY)
Admission: RE | Admit: 2019-07-01 | Discharge: 2019-07-01 | Disposition: A | Discharge status: Home / Self Care | Payer: BC Managed Care – PPO | Source: Ambulatory Visit | Attending: Cardiovascular Disease | Admitting: Cardiovascular Disease

## 2019-07-01 DIAGNOSIS — Z954 Presence of other heart-valve replacement: Secondary | ICD-10-CM | POA: Diagnosis not present

## 2019-07-01 NOTE — Progress Notes (Signed)
Cardiac Individual Treatment Plan  Patient Details  Name: Darren Haynes MRN: [id number] Date of Birth: 09-30-1986 Referring Provider:     CARDIAC REHAB PHASE II ORIENTATION from 06/18/2019 in [facility] CARDIAC [facility]  Referring Provider  Dr. Allyson Sabal      Initial Encounter Date:    CARDIAC REHAB PHASE II ORIENTATION from 06/18/2019 in [facility] CARDIAC REHAB  Date  06/18/19      Visit Diagnosis: S/P minimally invasive mitral valve replacement with mechanical valve  Patient's Home Medications on Admission:  Current Outpatient Medications:  .  acetaminophen (TYLENOL) 500 MG tablet, Take 2 tablets (1,000 mg total) by mouth every 6 (six) hours., Disp: 30 tablet, Rfl: 0 .  ALPRAZolam (NIRAVAM) 1 MG dissolvable tablet, Take 0.5 mg by mouth 3 (three) times daily as needed for anxiety. , Disp: , Rfl:  .  aspirin EC 81 MG tablet, Take 81 mg by mouth daily., Disp: , Rfl:  .  benztropine (COGENTIN) 0.5 MG tablet, Take 0.5 mg by mouth 2 (two) times daily., Disp: , Rfl:  .  Buprenorphine HCl (BELBUCA) 750 MCG FILM, Place 750 mcg inside cheek 2 (two) times daily. , Disp: , Rfl:  .  busPIRone (BUSPAR) 7.5 MG tablet, Take 7.5 mg by mouth 3 (three) times daily., Disp: , Rfl:  .  clonazePAM (KLONOPIN) 0.5 MG tablet, Take 0.5 mg by mouth at bedtime., Disp: , Rfl:  .  famotidine (PEPCID) 40 MG tablet, Take 40 mg by mouth daily., Disp: , Rfl:  .  fluticasone-salmeterol (ADVAIR HFA) 45-21 MCG/ACT inhaler, Inhale 2 puffs into the lungs 2 (two) times daily., Disp: , Rfl:  .  furosemide (LASIX) 40 MG tablet, Take 1 tablet (40 mg total) by mouth daily., Disp: 7 tablet, Rfl: 0 .  gabapentin (NEURONTIN) 300 MG capsule, Take 600 mg by mouth 3 (three) times daily. , Disp: , Rfl:  .  leflunomide (ARAVA) 20 MG tablet, Take 20 mg by mouth daily., Disp: , Rfl:  .  MELATONIN PO, Take 1 tablet by mouth at bedtime as needed (sleep)., Disp: , Rfl:  .  nortriptyline (PAMELOR) 25 MG  capsule, Take 25 mg by mouth at bedtime. , Disp: , Rfl:  .  oxyCODONE-acetaminophen (PERCOCET) 7.5-325 MG tablet, Take 1 tablet by mouth every 4 (four) hours as needed for severe pain., Disp: , Rfl:  .  penicillin v potassium (VEETID) 250 MG tablet, Take 250 mg by mouth 2 (two) times a day., Disp: , Rfl:  .  potassium chloride (K-DUR) 10 MEQ tablet, Take 1 tablet (10 mEq total) by mouth daily., Disp: 7 tablet, Rfl: 0 .  sertraline (ZOLOFT) 100 MG tablet, Take 100 mg by mouth at bedtime. , Disp: , Rfl:  .  tiZANidine (ZANAFLEX) 4 MG tablet, Take 4 mg by mouth 3 (three) times daily., Disp: , Rfl:  .  Vitamin D, Ergocalciferol, (DRISDOL) 1.25 MG (50000 UT) CAPS capsule, Take 50,000 Units by mouth every Tuesday. , Disp: , Rfl:  .  warfarin (COUMADIN) 2.5 MG tablet, Take 1 to 1.5 tablets by mouth daily as directed., Disp: 135 tablet, Rfl: 0  Past Medical History: Past Medical History:  Diagnosis Date  . Arthritis   . Avascular necrosis (HCC)   . CHF (congestive heart failure) (HCC)   . Chronic back pain   . Depression   . Dyspnea   . GERD (gastroesophageal reflux disease)   . Heart murmur   . Mitral valve stenosis   .  Panic attacks   . Pneumonia    hx 4 time  . Pulmonary hypertension (HCC)   . Rheumatic fever   . S/P minimally invasive mitral valve replacement with mechanical valve 05/14/2019   29 mm Sorin Carbomed[employer] bileaflet mechanical valve via right mini thoracotomy approach    Tobacco Use: Social History   Tobacco Use  Smoking Status Never Smoker  Smokeless Tobacco Current User  . Types: Snuff    Labs: Recent Review Flowsheet Data    Labs for ITP Cardiac and Pulmonary Rehab Latest Ref Rng & Units 05/11/2019 05/14/2019 05/14/2019 05/14/2019 05/14/2019   Hemoglobin A1c 4.8 - 5.6 % 4.0(L) - - - -   PHART 7.350 - 7.450 7.399 7.346(L) 7.224(L) 7.312(L) 7.298(L)   PCO2ART 32.0 - 48.0 mmHg 41.5 48.8(H) 56.3(H) 46.6 49.5(H)   HCO3 20.0 - 28.0 mmol/L 25.1 26.7 23.3 24.0 24.6   TCO2 22 - 32  mmol/L - 28 25 26 26    ACIDBASEDEF 0.0 - 2.0 mmol/L - - 4.0(H) 3.0(H) 2.0   O2SAT % 98.3 100.0 92.0 97.0 99.0      Capillary Blood Glucose: Lab Results  Component Value Date   GLUCAP 120 (H) 05/16/2019   GLUCAP 123 (H) 05/16/2019   GLUCAP 100 (H) 05/16/2019   GLUCAP 88 05/15/2019   GLUCAP 102 (H) 05/15/2019     Exercise Target Goals: Exercise Program Goal: Individual exercise prescription set using results from initial 6 min walk test and THRR while considering  patient's activity barriers and safety.   Exercise Prescription Goal: Initial exercise prescription builds to 30-45 minutes a day of aerobic activity, 2-3 days per week.  Home exercise guidelines will be given to patient during program as part of exercise prescription that the participant will acknowledge.  Activity Barriers & Risk Stratification: Activity Barriers & Cardiac Risk Stratification - 06/18/19 1150      Activity Barriers & Cardiac Risk Stratification   Activity Barriers  Arthritis;Joint Problems;Deconditioning;Balance Concerns;History of Falls;Assistive Device    Cardiac Risk Stratification  High       6 Minute Walk: 6 Minute Walk    Row Name 06/18/19 0800         6 Minute Walk   Phase  Initial     Distance  1117 feet     Walk Time  6 minutes     # of Rest Breaks  0     MPH  2.12     METS  2.61     RPE  13     VO2 Peak  17.08     Symptoms  Yes (comment)     Comments  bilateral knee pain (7/10)     Resting HR  107 bpm     Resting BP  104/70     Resting Oxygen Saturation   99 %     Exercise Oxygen Saturation  during 6 min walk  99 %     Max Ex. HR  118 bpm     Max Ex. BP  112/80     2 Minute Post BP  102/78        Oxygen Initial Assessment:   Oxygen Re-Evaluation:   Oxygen Discharge (Final Oxygen Re-Evaluation):   Initial Exercise Prescription: Initial Exercise Prescription - 06/18/19 1100      Date of Initial Exercise RX and Referring Provider   Date  06/18/19    Referring  Provider  Dr. Allyson Sabal      NuStep   Level  2  SPM  80    Minutes  15    METs  2      Arm Ergometer   Level  2.5    Minutes  15      Prescription Details   Frequency (times per week)  3    Duration  Progress to 30 minutes of continuous aerobic without signs/symptoms of physical distress      Intensity   THRR 40-80% of Max Heartrate  76-151    Ratings of Perceived Exertion  11-13    Perceived Dyspnea  0-4      Progression   Progression  Continue to progress workloads to maintain intensity without signs/symptoms of physical distress.      Resistance Training   Training Prescription  Yes    Weight  5 lbs    Reps  10-15       Perform Capillary Blood Glucose checks as needed.  Exercise Prescription Changes:  Exercise Prescription Changes    Row Name 06/22/19 1000 06/29/19 0856           Response to Exercise   Blood Pressure (Admit)  116/84  112/84      Blood Pressure (Exercise)  118/72  118/82      Blood Pressure (Exit)  112/68  108/72      Heart Rate (Admit)  107 bpm  115 bpm      Heart Rate (Exercise)  114 bpm  124 bpm      Heart Rate (Exit)  102 bpm  115 bpm      Rating of Perceived Exertion (Exercise)  13  12      Perceived Dyspnea (Exercise)  0  0      Symptoms  none  none      Comments  Patient tolerated 1st session of exercise well with some joint pain, which is chronic for him.  -      Duration  Progress to 30 minutes of  aerobic without signs/symptoms of physical distress  Progress to 30 minutes of  aerobic without signs/symptoms of physical distress      Intensity  THRR unchanged  THRR unchanged        Progression   Progression  Continue to progress workloads to maintain intensity without signs/symptoms of physical distress.  Continue to progress workloads to maintain intensity without signs/symptoms of physical distress.      Average METs  1.7  2.1        Resistance Training   Training Prescription  Yes  Yes      Weight  5 lbs  5 lbs      Reps  10-15   10-15      Time  10 Minutes  10 Minutes        Interval Training   Interval Training  No  No        NuStep   Level  2  2      SPM  80  85      Minutes  15  15      METs  1.7  2.1        Arm Ergometer   Level  2.5  3      Minutes  15  15        Home Exercise Plan   Plans to continue exercise at  -  Home (comment) Walking      Frequency  -  Add 4 additional days to program exercise sessions.  Initial Home Exercises Provided  -  06/29/19         Exercise Comments:  Exercise Comments    Row Name 06/22/19 [phone] 06/29/19 0942         Exercise Comments  Patient tolerated 1st session of exercise of exercise fairly well with some joint pain, which is chronic for him. Progress workloads as tolerated.  Reviewed home exercise guidelines and goals with patient.         Exercise Goals and Review:  Exercise Goals    Row Name 06/18/19 1202             Exercise Goals   Increase Physical Activity  Yes       Intervention  Provide advice, education, support and counseling about physical activity/exercise needs.;Develop an individualized exercise prescription for aerobic and resistive training based on initial evaluation findings, risk stratification, comorbidities and participant's personal goals.       Expected Outcomes  Long Term: Exercising regularly at least 3-5 days a week.;Long Term: Add in home exercise to make exercise part of routine and to increase amount of physical activity.;Short Term: Attend rehab on a regular basis to increase amount of physical activity.       Increase Strength and Stamina  Yes       Intervention  Provide advice, education, support and counseling about physical activity/exercise needs.;Develop an individualized exercise prescription for aerobic and resistive training based on initial evaluation findings, risk stratification, comorbidities and participant's personal goals.       Expected Outcomes  Short Term: Increase workloads from initial exercise  prescription for resistance, speed, and METs.;Short Term: Perform resistance training exercises routinely during rehab and add in resistance training at home;Long Term: Improve cardiorespiratory fitness, muscular endurance and strength as measured by increased METs and functional capacity ( )       Able to understand and use rate of perceived exertion (RPE) scale  Yes       Intervention  Provide education and explanation on how to use RPE scale       Expected Outcomes  Short Term: Able to use RPE daily in rehab to express subjective intensity level;Long Term:  Able to use RPE to guide intensity level when exercising independently       Able to understand and use Dyspnea scale  Yes       Intervention  Provide education and explanation on how to use Dyspnea scale       Expected Outcomes  Short Term: Able to use Dyspnea scale daily in rehab to express subjective sense of shortness of breath during exertion;Long Term: Able to use Dyspnea scale to guide intensity level when exercising independently       Knowledge and understanding of Target Heart Rate Range (THRR)  Yes       Expected Outcomes  Short Term: Able to state/look up THRR;Short Term: Able to use daily as guideline for intensity in rehab;Long Term: Able to use THRR to govern intensity when exercising independently       Able to check pulse independently  Yes       Intervention  Provide education and demonstration on how to check pulse in carotid and radial arteries.;Review the importance of being able to check your own pulse for safety during independent exercise       Expected Outcomes  Short Term: Able to explain why pulse checking is important during independent exercise;Long Term: Able to check pulse independently and accurately  Understanding of Exercise Prescription  Yes       Intervention  Provide education, explanation, and written materials on patient's individual exercise prescription       Expected Outcomes  Short Term: Able to  explain program exercise prescription;Long Term: Able to explain home exercise prescription to exercise independently          Exercise Goals Re-Evaluation : Exercise Goals Re-Evaluation    Row Name 06/22/19 0943 06/29/19 0942           Exercise Goal Re-Evaluation   Exercise Goals Review  Increase Physical Activity;Able to understand and use rate of perceived exertion (RPE) scale  Increase Physical Activity;Able to understand and use rate of perceived exertion (RPE) scale;Understanding of Exercise Prescription;Knowledge and understanding of Target Heart Rate Range (THRR);Able to check pulse independently;Increase Strength and Stamina      Comments  Patient able to understand and use RPE scale appropriately.  Reviewed home exercise guidelines with patient including THRR, RPE scale, and endpoints for exercise. Pt knows how to check his pulse independently. Pt's goal is to lower resting and exercise heart rate. Pt is walking 45 minutes daily.      Expected Outcomes  Increase workloads as tolerated to help improve cardiorespiratory fitness.  Patient will increase workloads as tolerated to help improve cardiorespiratory fitness and achieve lower heart rate response at rest and with exercise.         Discharge Exercise Prescription (Final Exercise Prescription Changes): Exercise Prescription Changes - 06/29/19 0856      Response to Exercise   Blood Pressure (Admit)  112/84    Blood Pressure (Exercise)  118/82    Blood Pressure (Exit)  108/72    Heart Rate (Admit)  115 bpm    Heart Rate (Exercise)  124 bpm    Heart Rate (Exit)  115 bpm    Rating of Perceived Exertion (Exercise)  12    Perceived Dyspnea (Exercise)  0    Symptoms  none    Duration  Progress to 30 minutes of  aerobic without signs/symptoms of physical distress    Intensity  THRR unchanged      Progression   Progression  Continue to progress workloads to maintain intensity without signs/symptoms of physical distress.     Average METs  2.1      Resistance Training   Training Prescription  Yes    Weight  5 lbs    Reps  10-15    Time  10 Minutes      Interval Training   Interval Training  No      NuStep   Level  2    SPM  85    Minutes  15    METs  2.1      Arm Ergometer   Level  3    Minutes  15      Home Exercise Plan   Plans to continue exercise at  Home (comment)   Walking   Frequency  Add 4 additional days to program exercise sessions.    Initial Home Exercises Provided  06/29/19       Nutrition:  Target Goals: Understanding of nutrition guidelines, daily intake of sodium 1500mg , cholesterol 200mg , calories 30% from fat and 7% or less from saturated fats, daily to have 5 or more servings of fruits and vegetables.  Biometrics: Pre Biometrics - 06/18/19 1203      Pre Biometrics   Height  6' 1.5" (1.867 m)    Weight  92.7  kg    Waist Circumference  38 inches    Hip Circumference  43.5 inches    Waist to Hip Ratio  0.87 %    BMI (Calculated)  26.59    Triceps Skinfold  17 mm    % Body Fat  24.9 %    Grip Strength  53 kg    Flexibility  15 in    Single Leg Stand  4.5 seconds        Nutrition Therapy Plan and Nutrition Goals:   Nutrition Assessments:   Nutrition Goals Re-Evaluation:   Nutrition Goals Re-Evaluation:   Nutrition Goals Discharge (Final Nutrition Goals Re-Evaluation):   Psychosocial: Target Goals: Acknowledge presence or absence of significant depression and/or stress, maximize coping skills, provide positive support system. Participant is able to verbalize types and ability to use techniques and skills needed for reducing stress and depression.  Initial Review & Psychosocial Screening: Initial Psych Review & Screening - 06/18/19 1110      Initial Review   Current issues with  Current Depression;Current Anxiety/Panic      Family Dynamics   Good Support System?  Yes   Pt's wife, mother, and grandmother are sources of support for patient.    Comments  Darren Haynes reports depression related to his current medical issures with his heart, arthiritis, and avascular necrosis.  He has a history of panic attacks.      Barriers   Psychosocial barriers to participate in program  The patient should benefit from training in stress management and relaxation.;Psychosocial barriers identified (see note)      Screening Interventions   Interventions  Encouraged to exercise;To provide support and resources with identified psychosocial needs;Provide feedback about the scores to participant    Expected Outcomes  Short Term goal: Utilizing psychosocial counselor, staff and physician to assist with identification of specific Stressors or current issues interfering with healing process. Setting desired goal for each stressor or current issue identified.;Long Term Goal: Stressors or current issues are controlled or eliminated.;Short Term goal: Identification and review with participant of any Quality of Life or Depression concerns found by scoring the questionnaire.;Long Term goal: The participant improves quality of Life and PHQ9 Scores as seen by post scores and/or verbalization of changes       Quality of Life Scores: Quality of Life - 06/18/19 1059      Quality of Life   Select  Quality of Life      Quality of Life Scores   Health/Function Pre  13.2 %    Socioeconomic Pre  17.64 %    Psych/Spiritual Pre  19.71 %    Family Pre  30 %    GLOBAL Pre  17.93 %      Scores of 19 and below usually indicate a poorer quality of life in these areas.  A difference of  2-3 points is a clinically meaningful difference.  A difference of 2-3 points in the total score of the Quality of Life Index has been associated with significant improvement in overall quality of life, self-image, physical symptoms, and general health in studies assessing change in quality of life.  PHQ-9: Recent Review Flowsheet Data    Dep[facility] 06/22/2019 06/18/2019   Decreased  Interest - 1   Down, Depressed, Hopeless - 1   PHQ - 2 Score - 2   Altered sleeping - 3   Tired, decreased energy - 3   Change in appetite - 0   Feeling bad or failure about  yourself  - 3   Trouble concentrating - 1   Moving slowly or fidgety/restless - 3   Suicidal thoughts - 0   PHQ-9 Score - 15   Difficult doing work/chores Somewhat difficult Somewhat difficult     Interpretation of Total Score  Total Score Depression Severity:  1-4 = Minimal depression, 5-9 = Mild depression, 10-14 = Moderate depression, 15-19 = Moderately severe depression, 20-27 = Severe depression   Psychosocial Evaluation and Intervention: Psychosocial Evaluation - 06/18/19 1111      Psychosocial Evaluation & Interventions   Interventions  Stress management education;Relaxation education;Encouraged to exercise with the program and follow exercise prescription    Comments  Darren Haynes reports depression related to his current medical issures with his heart, arthiritis, and avascular necrosis.  He has a history of panic attacks. Darren Haynes sees a psychiatrist once a month.  He finds it somewhat helpful.  Darren Haynes enjoys hunting, fishing, and Designer, multimediaplaying softball when he was able to.  He also enjoys watching his daughter play.    Expected Outcomes  Darren Haynes will report ability to manage his depression and anxiety.    Continue Psychosocial Services   Follow up required by counselor       Psychosocial Re-Evaluation: Psychosocial Re-Evaluation    Row Name 06/29/19 [phone]             Psychosocial Re-Evaluation   Current issues with  Current Depression       Comments  Darren Haynes has not voiced any increased depression and feels his current medications and treatments are working for him       Expected Outcomes  Will continue to monitor and provide emotional support as needed       Interventions  Encouraged to attend Cardiac Rehabilitation for the exercise       Continue Psychosocial Services   Follow up required by staff           Psychosocial Discharge (Final Psychosocial Re-Evaluation): Psychosocial Re-Evaluation - 06/29/19 0853      Psychosocial Re-Evaluation   Current issues with  Current Depression    Comments  Darren Haynes has not voiced any increased depression and feels his current medications and treatments are working for him    Expected Outcomes  Will continue to monitor and provide emotional support as needed    Interventions  Encouraged to attend Cardiac Rehabilitation for the exercise    Continue Psychosocial Services   Follow up required by staff       Vocational Rehabilitation: Provide vocational rehab assistance to qualifying candidates.   Vocational Rehab Evaluation & Intervention: Vocational Rehab - 06/18/19 1113      Initial Vocational Rehab Evaluation & Intervention   Assessment shows need for Vocational Rehabilitation  No       Education: Education Goals: Education classes will be provided on a weekly basis, covering required topics. Participant will state understanding/return demonstration of topics presented.  Learning Barriers/Preferences: Learning Barriers/Preferences - 06/18/19 1113      Learning Barriers/Preferences   Learning Barriers  None    Learning Preferences  Skilled Demonstration       Education Topics: Count Your Pulse:  -Group instruction provided by verbal instruction, demonstration, patient participation and written materials to support subject.  Instructors address importance of being able to find your pulse and how to count your pulse when at home without a heart monitor.  Patients get hands on experience counting their pulse with staff help and individually.   Heart Attack, Angina, and Risk Factor Modification:  -  Group instruction provided by verbal instruction, video, and written materials to support subject.  Instructors address signs and symptoms of angina and heart attacks.    Also discuss risk factors for heart disease and how to make changes to  improve heart health risk factors.   Functional Fitness:  -Group instruction provided by verbal instruction, demonstration, patient participation, and written materials to support subject.  Instructors address safety measures for doing things around the house.  Discuss how to get up and down off the floor, how to pick things up properly, how to safely get out of a chair without assistance, and balance training.   Meditation and Mindfulness:  -Group instruction provided by verbal instruction, patient participation, and written materials to support subject.  Instructor addresses importance of mindfulness and meditation practice to help reduce stress and improve awareness.  Instructor also leads participants through a meditation exercise.    Stretching for Flexibility and Mobility:  -Group instruction provided by verbal instruction, patient participation, and written materials to support subject.  Instructors lead participants through series of stretches that are designed to increase flexibility thus improving mobility.  These stretches are additional exercise for major muscle groups that are typically performed during regular warm up and cool down.   Hands Only CPR:  -Group verbal, video, and participation provides a basic overview of AHA guidelines for community CPR. Role-play of emergencies allow participants the opportunity to practice calling for help and chest compression technique with discussion of AED use.   Hypertension: -Group verbal and written instruction that provides a basic overview of hypertension including the most recent diagnostic guidelines, risk factor reduction with self-care instructions and medication management.    Nutrition I class: Heart Healthy Eating:  -Group instruction provided by PowerPoint slides, verbal discussion, and written materials to support subject matter. The instructor gives an explanation and review of the Therapeutic Lifestyle Changes diet  recommendations, which includes a discussion on lipid goals, dietary fat, sodium, fiber, plant stanol/sterol esters, sugar, and the components of a well-balanced, healthy diet.   Nutrition II class: Lifestyle Skills:  -Group instruction provided by PowerPoint slides, verbal discussion, and written materials to support subject matter. The instructor gives an explanation and review of label reading, grocery shopping for heart health, heart healthy recipe modifications, and ways to make healthier choices when eating out.   Diabetes Question & Answer:  -Group instruction provided by PowerPoint slides, verbal discussion, and written materials to support subject matter. The instructor gives an explanation and review of diabetes co-morbidities, pre- and post-prandial blood glucose goals, pre-exercise blood glucose goals, signs, symptoms, and treatment of hypoglycemia and hyperglycemia, and foot care basics.   Diabetes Blitz:  -Group instruction provided by PowerPoint slides, verbal discussion, and written materials to support subject matter. The instructor gives an explanation and review of the physiology behind type 1 and type 2 diabetes, diabetes medications and rational behind using different medications, pre- and post-prandial blood glucose recommendations and Hemoglobin A1c goals, diabetes diet, and exercise including blood glucose guidelines for exercising safely.    Portion Distortion:  -Group instruction provided by PowerPoint slides, verbal discussion, written materials, and food models to support subject matter. The instructor gives an explanation of serving size versus portion size, changes in portions sizes over the last 20 years, and what consists of a serving from each food group.   Stress Management:  -Group instruction provided by verbal instruction, video, and written materials to support subject matter.  Instructors review role of stress in heart disease  and how to cope with stress  positively.     Exercising on Your Own:  -Group instruction provided by verbal instruction, power point, and written materials to support subject.  Instructors discuss benefits of exercise, components of exercise, frequency and intensity of exercise, and end points for exercise.  Also discuss use of nitroglycerin and activating EMS.  Review options of places to exercise outside of rehab.  Review guidelines for sex with heart disease.   Cardiac Drugs I:  -Group instruction provided by verbal instruction and written materials to support subject.  Instructor reviews cardiac drug classes: antiplatelets, anticoagulants, beta blockers, and statins.  Instructor discusses reasons, side effects, and lifestyle considerations for each drug class.   Cardiac Drugs II:  -Group instruction provided by verbal instruction and written materials to support subject.  Instructor reviews cardiac drug classes: angiotensin converting enzyme inhibitors (ACE-I), angiotensin II receptor blockers (ARBs), nitrates, and calcium channel blockers.  Instructor discusses reasons, side effects, and lifestyle considerations for each drug class.   Anatomy and Physiology of the Circulatory System:  Group verbal and written instruction and models provide basic cardiac anatomy and physiology, with the coronary electrical and arterial systems. Review of: AMI, Angina, Valve disease, Heart Failure, Peripheral Artery Disease, Cardiac Arrhythmia, Pacemakers, and the ICD.   Other Education:  -Group or individual verbal, written, or video instructions that support the educational goals of the cardiac rehab program.   Holiday Eating Survival Tips:  -Group instruction provided by PowerPoint slides, verbal discussion, and written materials to support subject matter. The instructor gives patients tips, tricks, and techniques to help them not only survive but enjoy the holidays despite the onslaught of food that accompanies the  holidays.   Knowledge Questionnaire Score: Knowledge Questionnaire Score - 06/18/19 1112      Knowledge Questionnaire Score   Pre Score  22/28       Core Components/Risk Factors/Patient Goals at Admission: Personal Goals and Risk Factors at Admission - 07/01/19 1638      Core Components/Risk Factors/Patient Goals on Admission    Weight Management  Yes       Core Components/Risk Factors/Patient Goals Review:  Goals and Risk Factor Review    Row Name 07/01/19 1639             Core Components/Risk Factors/Patient Goals Review   Personal Goals Review  Weight Management/Obesity;Heart Failure;Stress;Tobacco Cessation       Review  Darren Haynes is off to a good start to exercise. Michaels vital signs have been stable. Will continue to encourage smoking cessation and stress management.       Expected Outcomes  Patient will continue to participate in phase 2 cardiac rehab for exercise and risk factor management.          Core Components/Risk Factors/Patient Goals at Discharge (Final Review):  Goals and Risk Factor Review - 07/01/19 1639      Core Components/Risk Factors/Patient Goals Review   Personal Goals Review  Weight Management/Obesity;Heart Failure;Stress;Tobacco Cessation    Review  Darren Haynes is off to a good start to exercise. Michaels vital signs have been stable. Will continue to encourage smoking cessation and stress management.    Expected Outcomes  Patient will continue to participate in phase 2 cardiac rehab for exercise and risk factor management.       ITP Comments: ITP Comments    Row Name 06/18/19 1050 07/02/19 1133         ITP Comments  Dr. Armanda Magic, Medical Director  30 Day  ITP Review. Darren Haynes is off to a good start to exercise.         Comments: 30 Day ITP Review. See ITP comments.Darren Lighter, RN,BSN 07/02/2019 11:36 AM

## 2019-07-03 ENCOUNTER — Telehealth: Payer: Self-pay | Admitting: Cardiovascular Disease

## 2019-07-03 ENCOUNTER — Other Ambulatory Visit: Payer: Self-pay

## 2019-07-03 ENCOUNTER — Encounter (HOSPITAL_COMMUNITY)
Admission: RE | Admit: 2019-07-03 | Discharge: 2019-07-03 | Disposition: A | Discharge status: Home / Self Care | Payer: BC Managed Care – PPO | Source: Ambulatory Visit | Attending: Cardiovascular Disease | Admitting: Cardiovascular Disease

## 2019-07-03 DIAGNOSIS — Z954 Presence of other heart-valve replacement: Secondary | ICD-10-CM | POA: Diagnosis not present

## 2019-07-03 NOTE — Telephone Encounter (Signed)
New message   Call from [place], [place], patient is there now  Pt c/o swelling: STAT is pt has developed SOB within 24 hours  1) How much weight have you gained and in what time span? 1.4kg  Gain in 2 days  2) If swelling, where is the swelling located? n/a  3) Are you currently taking a fluid pill? Patient has not taking since Saturday  4) Are you currently SOB? NO  5) Do you have a log of your daily weights (if so, list)? 94.2kg today  6) Have you gained 3 pounds in a day or 5 pounds in a week?   7) Have you traveled recently? no

## 2019-07-03 NOTE — Telephone Encounter (Signed)
Returned call to pt he states that he went to cardiac rehab today and states that his weight is up today, he has just took his lasix. He states that he will keep a log of weight and will continue to take lasix PRN >3#/day when need. scheduled APP f/u to discuss this. He will bring log to refer to the weight/lasix. He seems to have a good understanding of when to take the lasix. He will keep track to discuss this at the APP appt.

## 2019-07-03 NOTE — Progress Notes (Signed)
Jonah's weight is up 1.4 kg from Wednesday. Shion denies shortness of breath. Weight today 94.2 kg. Weight Wednesday 92.8 kg. Upon assessment lung fields slightly diminished in the bases otherwise clear. Temperature 98.1.heart rate 110 Sinus tach. No peripheral edema noted. Jaquawn says he takes furosemide as needed upon review of medication list. Jaceyon's furosemide is listed to take daily. Oxygen saturation 98% on room air. Dr Kennon Holter office called and notified.Will continue to monitor the patient throughout  the program.Maria Venetia Maxon, RN,BSN 07/03/2019 9:11 AM

## 2019-07-05 ENCOUNTER — Other Ambulatory Visit: Payer: Self-pay

## 2019-07-05 ENCOUNTER — Emergency Department (HOSPITAL_COMMUNITY)
Admission: EM | Admit: 2019-07-05 | Discharge: 2019-07-05 | Disposition: A | Discharge status: Home / Self Care | Payer: BC Managed Care – PPO | Attending: Emergency Medicine | Admitting: Emergency Medicine

## 2019-07-05 ENCOUNTER — Emergency Department (HOSPITAL_COMMUNITY): Payer: BC Managed Care – PPO

## 2019-07-05 DIAGNOSIS — R0602 Shortness of breath: Secondary | ICD-10-CM | POA: Insufficient documentation

## 2019-07-05 DIAGNOSIS — R0789 Other chest pain: Secondary | ICD-10-CM | POA: Insufficient documentation

## 2019-07-05 DIAGNOSIS — R079 Chest pain, unspecified: Secondary | ICD-10-CM

## 2019-07-05 DIAGNOSIS — Z79899 Other long term (current) drug therapy: Secondary | ICD-10-CM | POA: Diagnosis not present

## 2019-07-05 DIAGNOSIS — R791 Abnormal coagulation profile: Secondary | ICD-10-CM | POA: Insufficient documentation

## 2019-07-05 DIAGNOSIS — I509 Heart failure, unspecified: Secondary | ICD-10-CM | POA: Insufficient documentation

## 2019-07-05 DIAGNOSIS — Z952 Presence of prosthetic heart valve: Secondary | ICD-10-CM | POA: Insufficient documentation

## 2019-07-05 DIAGNOSIS — Z7982 Long term (current) use of aspirin: Secondary | ICD-10-CM | POA: Insufficient documentation

## 2019-07-05 DIAGNOSIS — Z5181 Encounter for therapeutic drug level monitoring: Secondary | ICD-10-CM

## 2019-07-05 LAB — BASIC METABOLIC PANEL
Anion gap: 11 (ref 5–15)
BUN: 9 mg/dL (ref 6–20)
CO2: 23 mmol/L (ref 22–32)
Calcium: 8.6 mg/dL — ABNORMAL LOW (ref 8.9–10.3)
Chloride: 106 mmol/L (ref 98–111)
Creatinine, Ser: 1.06 mg/dL (ref 0.61–1.24)
GFR calc Af Amer: >60 mL/min (ref >60)
GFR calc non Af Amer: >60 mL/min (ref >60)
Glucose, Bld: 109 mg/dL — ABNORMAL HIGH (ref 70–99)
Potassium: 4 mmol/L (ref 3.5–5.1)
Sodium: 140 mmol/L (ref 135–145)

## 2019-07-05 LAB — CBC
HCT: 39.5 % (ref 39.0–52.0)
Hemoglobin: 12.2 g/dL — ABNORMAL LOW (ref 13.0–17.0)
MCH: 25.5 pg — ABNORMAL LOW (ref 26.0–34.0)
MCHC: 30.9 g/dL (ref 30.0–36.0)
MCV: 82.6 fL (ref 80.0–100.0)
Platelets: 286 10*3/uL (ref 150–400)
RBC: 4.78 MIL/uL (ref 4.22–5.81)
RDW: 15 % (ref 11.5–15.5)
WBC: 7.9 10*3/uL (ref 4.0–10.5)
nRBC: 0 % (ref 0.0–0.2)

## 2019-07-05 LAB — PROTIME-INR
INR: 2 — ABNORMAL HIGH (ref 0.8–1.2)
Prothrombin Time: 22 seconds — ABNORMAL HIGH (ref 11.4–15.2)

## 2019-07-05 LAB — BRAIN NATRIURETIC PEPTIDE: B Natriuretic Peptide: 49.6 pg/mL (ref 0.0–100.0)

## 2019-07-05 LAB — MAGNESIUM: Magnesium: 1.9 mg/dL (ref 1.7–2.4)

## 2019-07-05 LAB — TROPONIN I (HIGH SENSITIVITY): Troponin I (High Sensitivity): 3 ng/L (ref <18)

## 2019-07-05 LAB — LACTIC ACID, PLASMA: Lactic Acid, Venous: 1.5 mmol/L (ref 0.5–1.9)

## 2019-07-05 MED ORDER — SODIUM CHLORIDE 0.9% FLUSH
3.0000 mL | Freq: Once | INTRAVENOUS | Status: AC
  Administered 2019-07-05: 3 mL via INTRAVENOUS

## 2019-07-05 NOTE — Discharge Instructions (Addendum)
You were seen in the emergency department for intermittent chest pain and shortness of breath over the past week.  You had blood work EKG chest x-ray that did not show any serious findings.  Your INR was low at 2.0 and you will need to take an elevated dose tonight.  Please keep your appointment with Dr. Roxy Manns tomorrow.  Return to the emergency department if any acute worsening symptoms.

## 2019-07-05 NOTE — ED Provider Notes (Signed)
[facility] EMERGENCY DEPARTMENT Provider Note   CSN: [id number] Arrival date & time: 07/05/19  1556     History   Chief Complaint Chief Complaint  Patient presents with  . Chest Pain  . Weakness    HPI Darren Haynes is a 33 y.o. male.  He has a history of rheumatic heart disease and mitral valve stenosis status post mitral valve replacement.  He said he is been feeling some left-sided chest pain and increased shortness of breath since Sunday although he got a little bit better in the middle of the week and now worse again the last couple of days.  He has had more cough productive of some sputum.  Intermittent fevers which he usually gets at nighttime anyways but has been having up to 101 during the day.  He is also had a 7 pound weight gain which he took extra Lasix for now is back to baseline.  He has increased pain with deep breath.  He did not talk to his cardiologist about his symptoms because he said they just sent him to the ER anyways.  He said he has been taking his medications regular.     The history is provided by the patient.  Chest Pain Pain location:  L chest Pain quality: pressure   Pain radiates to:  Does not radiate Pain severity:  Moderate Onset quality:  Gradual Timing:  Intermittent Progression:  Unchanged Chronicity:  Recurrent Context: breathing   Relieved by:  None tried Worsened by:  Deep breathing Ineffective treatments:  None tried Associated symptoms: cough, fever and shortness of breath   Associated symptoms: no abdominal pain, no diaphoresis, no headache, no nausea, no syncope, no vomiting and no weakness   Weakness Associated symptoms: chest pain, cough, fever and shortness of breath   Associated symptoms: no abdominal pain, no dysuria, no headaches, no nausea, no syncope and no vomiting     Past Medical History:  Diagnosis Date  . Arthritis   . Avascular necrosis (HCC)   . CHF (congestive heart failure) (HCC)   .  Chronic back pain   . Depression   . Dyspnea   . GERD (gastroesophageal reflux disease)   . Heart murmur   . Mitral valve stenosis   . Panic attacks   . Pneumonia    hx 4 time  . Pulmonary hypertension (HCC)   . Rheumatic fever   . 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 PSVT (paroxysmal supraventricular tachycardia) (HCC) 03/12/2019  . Nonsustained ventricular tachycardia (HCC) 03/12/2019  . Bilateral lower extremity edema 03/12/2019  . Dyspnea on exertion 03/12/2019  . Mitral stenosis 12/09/2018    Past Surgical History:  Procedure Laterality Date  . BALLOON VALVULOPLASTY  12/24/2018   balloon mitral valvuloplasty at [facility]  . MITRAL VALVE REPLACEMENT Right 05/14/2019   Procedure: MINIMALLY INVASIVE MITRAL VALVE (MV) REPLACEMENT USING CARBOMEDICS OPTIFORM Size 100mm;  Surgeon: Purcell Nails, MD;  Location: [facility] OR;  Service: Open Heart Surgery;  Laterality: Right;  . RIGHT/LEFT HEART CATH AND CORONARY ANGIOGRAPHY N/A 12/11/2018   Procedure: RIGHT/LEFT HEART CATH AND CORONARY ANGIOGRAPHY;  Surgeon: Runell Gess, MD;  Location: [facility] INVASIVE CV LAB;  Service: Cardiovascular;  Laterality: N/A;  . TEE WITHOUT  CARDIOVERSION N/A 12/11/2018   Procedure: TRANSESOPHAGEAL ECHOCARDIOGRAM (TEE);  Surgeon: Sanda Klein, MD;  Location: [place];  Service: Cardiovascular;  Laterality: N/A;  . TEE WITHOUT CARDIOVERSION N/A 03/27/2019   Procedure: TRANSESOPHAGEAL ECHOCARDIOGRAM (TEE);  Surgeon: Acie Fredrickson Wonda Cheng, MD;  Location: [place];  Service: Cardiovascular;  Laterality: N/A;  . TEE WITHOUT CARDIOVERSION N/A 05/14/2019   Procedure: TRANSESOPHAGEAL ECHOCARDIOGRAM (TEE);  Surgeon: Rexene Alberts, MD;  Location: [place];   Service: Open Heart Surgery;  Laterality: N/A;  . TYMPANOSTOMY TUBE PLACEMENT          Home Medications    Prior to Admission medications   Medication Sig Start Date End Date Taking? Authorizing Provider  acetaminophen (TYLENOL) 500 MG tablet Take 2 tablets (1,000 mg total) by mouth every 6 (six) hours. 05/19/19   Elgie Collard, PA-C  ALPRAZolam (NIRAVAM) 1 MG dissolvable tablet Take 0.5 mg by mouth 3 (three) times daily as needed for anxiety.     [provider]  aspirin EC 81 MG tablet Take 81 mg by mouth daily.    [provider]  benztropine (COGENTIN) 0.5 MG tablet Take 0.5 mg by mouth 2 (two) times daily.    [provider]  Buprenorphine HCl (BELBUCA) 750 MCG FILM Place 750 mcg inside cheek 2 (two) times daily.     [provider]  busPIRone (BUSPAR) 7.5 MG tablet Take 7.5 mg by mouth 3 (three) times daily.    [provider]  clonazePAM (KLONOPIN) 0.5 MG tablet Take 0.5 mg by mouth at bedtime.    [provider]  famotidine (PEPCID) 40 MG tablet Take 40 mg by mouth daily.    [provider]  fluticasone-salmeterol (ADVAIR HFA) [phone] MCG/ACT inhaler Inhale 2 puffs into the lungs 2 (two) times daily.    [provider]  furosemide (LASIX) 40 MG tablet Take 1 tablet (40 mg total) by mouth daily. 06/15/19   Barrett, Erin R, PA-C  gabapentin (NEURONTIN) 300 MG capsule Take 600 mg by mouth 3 (three) times daily.     [provider]  leflunomide (ARAVA) 20 MG tablet Take 20 mg by mouth daily.    [provider]  MELATONIN PO Take 1 tablet by mouth at bedtime as needed (sleep).    [provider]  nortriptyline (PAMELOR) 25 MG capsule Take 25 mg by mouth at bedtime.  10/24/18   [provider]  oxyCODONE-acetaminophen (PERCOCET) 7.5-325 MG tablet Take 1 tablet by mouth every 4 (four) hours as needed for severe pain.    [provider]  penicillin v potassium (VEETID) 250 MG  tablet Take 250 mg by mouth 2 (two) times a day.    [provider]  potassium chloride (K-DUR) 10 MEQ tablet Take 1 tablet (10 mEq total) by mouth daily. 06/15/19   Barrett, Erin R, PA-C  sertraline (ZOLOFT) 100 MG tablet Take 100 mg by mouth at bedtime.     [provider]  tiZANidine (ZANAFLEX) 4 MG tablet Take 4 mg by mouth 3 (three) times daily. 04/22/19   [provider]  Vitamin D, Ergocalciferol, (DRISDOL) 1.25 MG (50000 UT) CAPS capsule Take 50,000 Units by mouth every Tuesday.     [provider]  warfarin (COUMADIN) 2.5 MG tablet Take 1 to 1.5 tablets by mouth daily as directed. 06/24/19   Lorretta Harp, MD    Family History Family History  Problem Relation Age of Onset  . Heart disease Mother   . Depression  Mother   . Hypertension Father   . Hyperlipidemia Father     Social History Social History   Tobacco Use  . Smoking status: Never Smoker  . Smokeless tobacco: Current User    Types: Snuff  Substance Use Topics  . Alcohol use: No  . Drug use: No     Allergies   Patient has no known allergies.   Review of Systems Review of Systems  Constitutional: Positive for fever and unexpected weight change. Negative for diaphoresis.  HENT: Negative for sore throat.   Eyes: Negative for visual disturbance.  Respiratory: Positive for cough and shortness of breath.   Cardiovascular: Positive for chest pain. Negative for syncope.  Gastrointestinal: Negative for abdominal pain, nausea and vomiting.  Genitourinary: Negative for dysuria.  Musculoskeletal: Negative for neck pain.  Skin: Negative for rash.  Neurological: Negative for weakness and headaches.     Physical Exam Updated Vital Signs BP 130/73 (BP Location: Right Arm)   Pulse (!) 113   Temp 98.3 F (36.8 C) (Oral)   Resp 18   Ht 6\' 1"  (1.854 m)   Wt 91.6 kg   SpO2 100%   BMI 26.65 kg/m   Physical Exam Vitals signs and nursing note reviewed.  Constitutional:       Appearance: He is well-developed.  HENT:     Head: Normocephalic and atraumatic.  Eyes:     Conjunctiva/sclera: Conjunctivae normal.  Neck:     Musculoskeletal: Neck supple.  Cardiovascular:     Rate and Rhythm: Regular rhythm. Tachycardia present.     Heart sounds: No murmur.  Pulmonary:     Effort: Pulmonary effort is normal. No respiratory distress.     Breath sounds: Normal breath sounds.  Abdominal:     Palpations: Abdomen is soft.     Tenderness: There is no abdominal tenderness.  Musculoskeletal: Normal range of motion.     Right lower leg: He exhibits no tenderness.     Left lower leg: He exhibits no tenderness.  Skin:    General: Skin is warm and dry.     Capillary Refill: Capillary refill takes less than 2 seconds.  Neurological:     General: No focal deficit present.     Mental Status: He is alert and oriented to person, place, and time.      ED Treatments / Results  Labs (all labs ordered are listed, but only abnormal results are displayed) Labs Reviewed  BASIC METABOLIC PANEL - Abnormal; Notable for the following components:      Result Value   Glucose, Bld 109 (*)    Calcium 8.6 (*)    All other components within normal limits  CBC - Abnormal; Notable for the following components:   Hemoglobin 12.2 (*)    MCH 25.5 (*)    All other components within normal limits  PROTIME-INR - Abnormal; Notable for the following components:   Prothrombin Time 22.0 (*)    INR 2.0 (*)    All other components within normal limits  CULTURE, BLOOD (ROUTINE X 2)  CULTURE, BLOOD (ROUTINE X 2)  SARS CORONAVIRUS 2  BRAIN NATRIURETIC PEPTIDE  LACTIC ACID, PLASMA  MAGNESIUM  TROPONIN I (HIGH SENSITIVITY)  TROPONIN I (HIGH SENSITIVITY)    EKG EKG Interpretation  Date/Time:  Sunday July 05 2019 16:03:36 EDT Ventricular Rate:  116 PR Interval:    QRS Duration: 72 QT Interval:  448 QTC Calculation: 622 R Axis:   76 Text Interpretation:  Accelerated Junctional rhythm  Prolonged QT Abnormal ECG no STEMI compared with prior 8/20 Confirmed by Meridee ScoreButler, Karsen [phone]([id number]) on 07/05/2019 4:53:11 PM   Radiology Dg Chest 2 View  Result Date: 07/05/2019 CLINICAL DATA:  Shortness of breath, chest pain, back pain, recent heart surgery EXAM: CHEST - 2 VIEW COMPARISON:  06/27/2019 FINDINGS: The heart size and mediastinal contours are within normal limits. Surgical mitral valve prosthesis. Both lungs are clear. Trace bilateral pleural effusions. The visualized skeletal structures are unremarkable. IMPRESSION: Trace bilateral pleural effusions. No focal airspace opacity. Surgical mitral valve prosthesis. Electronically Signed   By: Lauralyn PrimesAlex  Bibbey M.D.   On: 07/05/2019 17:34    Procedures Procedures (including critical care time)  Medications Ordered in ED Medications  sodium chloride flush (NS) 0.9 % injection 3 mL (3 mLs Intravenous Given 07/05/19 1814)     Initial Impression / Assessment and Plan / ED Course  I have reviewed the triage vital signs and the nursing notes.  Pertinent labs & imaging results that were available during my care of the patient were reviewed by me and considered in my medical decision making (see chart for details).  Clinical Course as of Jul 05 844  Wynelle LinkSun Jul 05, 2019  1720 Patient with known rheumatic heart disease and is recently had a mitral valve replacement.  He is complaining of left-sided chest pain shortness of breath intermittent fevers.  He is nontoxic-appearing although tachycardic although he says his baseline heart rate is usually around 100.  Not hypoxic.  Differential includes ACS, valvular heart disease, pneumonia, Covid, pneumothorax, CHF   [MB]  1725 Chest x-ray shows no obvious pneumothorax or CHF, awaiting radiology reading.   [MB]  1728 I updated the patient on her results.  She said her headache is a little bit better after the pain medicine.   [MB]  1807 Discussed with cardiology on-call Dr. Lisbeth PlyFaulk.  She said if his labs and  chest x-ray look okay and he does not appear clinically in distress that he has an appointment tomorrow with cardiology and she would recommend that he follow-up outpatient with them so they can get him an echo.   [MB]  1834 Patient's lab work was unremarkable other than a slightly low potassium INR of 2.0.  I reviewed all this with him along with the results of his chest x-ray.  He has an appointment with Dr. Cornelius Moraswen tomorrow.  He clinically looks very stable and so I think this is reasonable that he follows up tomorrow for further evaluation.  Saturations have been 99 to 100% on room air the entire time.   [MB]    Clinical Course User Index [MB] Terrilee FilesButler, Eidan C, MD       Final Clinical Impressions(s) / ED Diagnoses   Final diagnoses:  Nonspecific chest pain  Shortness of breath  Subtherapeutic anticoagulation    ED Discharge Orders    None       Terrilee FilesButler, Kamdyn C, MD 07/06/19 [phone]

## 2019-07-05 NOTE — ED Triage Notes (Signed)
Pt presents from home for chest pain, shob, back pain, pain with deep breath starting, L chest swelling  Friday night. Gain 7 lb between wed and Friday, now at baseline wt. Positive episode of orthostatic hypotension.  Recently had a valve replacement for mitral valve replacement 1 mo ago for mitral valve stenosis, states this feels like he did before his surgery.[name] Kitchen

## 2019-07-06 ENCOUNTER — Telehealth: Payer: Self-pay | Admitting: Cardiovascular Disease

## 2019-07-06 ENCOUNTER — Telehealth: Payer: Self-pay | Admitting: Pharmacist Clinician (PhC)/ Clinical Pharmacy Specialist

## 2019-07-06 ENCOUNTER — Ambulatory Visit (INDEPENDENT_AMBULATORY_CARE_PROVIDER_SITE_OTHER): Payer: Self-pay | Admitting: Thoracic Surgery (Cardiothoracic Vascular Surgery)

## 2019-07-06 ENCOUNTER — Other Ambulatory Visit: Payer: Self-pay | Admitting: Thoracic Surgery (Cardiothoracic Vascular Surgery)

## 2019-07-06 ENCOUNTER — Encounter (HOSPITAL_COMMUNITY): Payer: BC Managed Care – PPO

## 2019-07-06 ENCOUNTER — Encounter: Payer: Self-pay | Admitting: Thoracic Surgery (Cardiothoracic Vascular Surgery)

## 2019-07-06 ENCOUNTER — Ambulatory Visit (INDEPENDENT_AMBULATORY_CARE_PROVIDER_SITE_OTHER): Payer: Self-pay | Admitting: Pharmacist Clinician (PhC)/ Clinical Pharmacy Specialist

## 2019-07-06 ENCOUNTER — Telehealth (HOSPITAL_COMMUNITY): Payer: Self-pay | Admitting: *Deleted

## 2019-07-06 VITALS — BP 123/81 | HR 102 | Temp 97.7°F | Resp 18 | Ht 74.0 in | Wt 205.6 lb

## 2019-07-06 DIAGNOSIS — Z954 Presence of other heart-valve replacement: Secondary | ICD-10-CM

## 2019-07-06 DIAGNOSIS — I050 Rheumatic mitral stenosis: Secondary | ICD-10-CM

## 2019-07-06 MED ORDER — METOPROLOL TARTRATE 12.5 MG HALF TABLET: 25.0000 mg | ORAL_TABLET | Freq: Two times a day (BID) | ORAL | Status: DC

## 2019-07-06 NOTE — Telephone Encounter (Signed)
New Message   Delsa Sale with [facility] is calling for Dr. Roxy Manns. Dr. Roxy Manns wants patient to be seen within the week due to abnormal EKG. I did offer a virtual visit this week but per Delsa Sale she wanted me to send a message. She is not sure if virtual will be okay. Please call to discuss.

## 2019-07-06 NOTE — Progress Notes (Signed)
[address]       Darren Haynes [address]             [phone]     CARDIOTHORACIC SURGERY OFFICE NOTE  Primary Cardiologist is Nanetta Batty, MD PCP is [facility]   HPI:  Patient is a 33 year old male with rheumatic mitral stenosis, chronic diastolic congestive heart failure, pulmonary hypertension, rheumatoid arthritis, and possible multiple sclerosis who returns the office today for follow-up status post minimally invasive mitral valve replacement using a mechanical prosthetic valve on May 14, 2019.  The patient's early postoperative recovery in the hospital was notable for a brief episode of atrial fibrillation for which amiodarone was started.  He subsequently developed complete heart block and amiodarone was stopped.  Rhythm recovered and he maintained sinus rhythm with long first-degree AV block.  He was seen in consultation by the EP service and sent home with a ZIO patch in place.  He was last seen here in our office for follow-up on 06/15/2019.  Routine follow-up echocardiogram performed June 23, 2019 look good with normal left and right ventricular systolic function.  Mechanical valve in the mitral position was functioning normally with no paravalvular leak.  Mean transvalvular gradient was estimated 7 mmHg.  Patient has been taking Coumadin as prescribed and follow closely in the Coumadin clinic.  His most recent INR was a little bit subtherapeutic at 2.201 June 30, 2019.  He states that for a while after that he continued to make gradual improvement, but approximately 1 week ago he began to go backwards.  He complains of increased pulse rate and mild discomfort across the left side of his chest and the left upper back.  Discomfort is mild and is already better than it was a few days ago.  The discomfort seems to be worse with deep inspiration and the patient feels as though he cannot keep a deep breath.  He still reports intermittent low-grade fever  of 99 to 100 degrees.  Because of the symptoms he went to the emergency department last night.  Chest x-ray was clear.  Blood work was all normal including normal white blood count and normal BNP level.  INR was 2.0.  EKG was interpreted as accelerated junctional rhythm.  The patient was instructed to follow-up with his cardiologist today.  His next appointment with Dr. Allyson Sabal is September 01, 2019.   Current Outpatient Medications  Medication Sig Dispense Refill   acetaminophen (TYLENOL) 500 MG tablet Take 2 tablets (1,000 mg total) by mouth every 6 (six) hours. 30 tablet 0   ALPRAZolam (NIRAVAM) 1 MG dissolvable tablet Take 0.5 mg by mouth 3 (three) times daily as needed for anxiety.      aspirin EC 81 MG tablet Take 81 mg by mouth daily.     benztropine (COGENTIN) 0.5 MG tablet Take 0.5 mg by mouth 2 (two) times daily.     Buprenorphine HCl (BELBUCA) 750 MCG FILM Place 750 mcg inside cheek 2 (two) times daily.      busPIRone (BUSPAR) 7.5 MG tablet Take 7.5 mg by mouth 3 (three) times daily.     clonazePAM (KLONOPIN) 0.5 MG tablet Take 0.5 mg by mouth at bedtime.     famotidine (PEPCID) 40 MG tablet Take 40 mg by mouth daily.     fluticasone-salmeterol (ADVAIR HFA) 45-21 MCG/ACT inhaler Inhale 2 puffs into the lungs 2 (two) times daily.     furosemide (LASIX) 40 MG tablet Take 1 tablet (  40 mg total) by mouth daily. 7 tablet 0   gabapentin (NEURONTIN) 300 MG capsule Take 600 mg by mouth 3 (three) times daily.      leflunomide (ARAVA) 20 MG tablet Take 20 mg by mouth daily.     MELATONIN PO Take 1 tablet by mouth at bedtime as needed (sleep).     nortriptyline (PAMELOR) 25 MG capsule Take 25 mg by mouth at bedtime.      oxyCODONE-acetaminophen (PERCOCET) 7.5-325 MG tablet Take 1 tablet by mouth every 4 (four) hours as needed for severe pain.     penicillin v potassium (VEETID) 250 MG tablet Take 250 mg by mouth 2 (two) times a day.     potassium chloride (K-DUR) 10 MEQ tablet Take  1 tablet (10 mEq total) by mouth daily. 7 tablet 0   sertraline (ZOLOFT) 100 MG tablet Take 100 mg by mouth at bedtime.      tiZANidine (ZANAFLEX) 4 MG tablet Take 4 mg by mouth 3 (three) times daily.     Vitamin D, Ergocalciferol, (DRISDOL) 1.25 MG (50000 UT) CAPS capsule Take 50,000 Units by mouth every Tuesday.      warfarin (COUMADIN) 2.5 MG tablet Take 1 to 1.5 tablets by mouth daily as directed. 135 tablet 0   No current facility-administered medications for this visit.       Physical Exam:   BP 123/81 (BP Location: Right Arm, Patient Position: Sitting, Cuff Size: Normal)    Pulse (!) 102    Temp 97.7 F (36.5 C)    Resp 18    Ht 6\' 2"  (1.88 m)    Wt 205 lb 9.6 oz (93.3 kg)    SpO2 98% Comment: RA   BMI 26.40 kg/m   General:  Well-appearing  Chest:   Clear to auscultation with symmetrical breath sounds  CV:   Tachycardic with regular rate and rhythm and mechanical heart valve sounds  Incisions:  Healing nicely  Abdomen:  Soft nontender  Extremities:  Warm and well-perfused, no edema  Diagnostic Tests:  ECHOCARDIOGRAM REPORT       Patient Name:   Darren Haynes Date of Exam: 06/23/2019 Medical Rec #:  [id number]       Height:       73.5 in Accession #:    [id number]      Weight:       204.4 lb Date of Birth:  [birth year]       BSA:          2.18 m Patient Age:    33 years        BP:           100/60 mmHg Patient Gender: M               HR:           102 bpm. Exam Location:  [address]    Procedure: 2D Echo, 3D Echo, Cardiac Doppler and Color Doppler  Indications:    I31.1 Pericardial Effusion                 Z95.4 Status Post Mitral Valve Replacement   History:        Patient has prior history of Echocardiogram examinations, most                 recent 05/14/2019. CHF Pulmonary HTN Mitral Stenosis                 Signs/Symptoms: Murmur and Shortness of  Breath Risk Factors:                 Family History of Coronary Artery Disease. Severe Mitral                  Stenosis from Rheumatic Fever with Balloon Valvuloplasty                 (January 2020), Mitral Valve Replacement 05-14-19 (29 mm Sorin),                 Edema.   Sonographer:    Farrel ConnersBethany Mcmahill RDCS Referring Phys: [phone] ERIN R BARRETT  IMPRESSIONS    1. The left ventricle has low normal systolic function, with an ejection fraction of 50-55%. The cavity size was normal. Left ventricular diastolic function could not be evaluated due to indeterminate diastolic function. No evidence of left ventricular  regional wall motion abnormalities.  2. The right ventricle has normal systolic function. The cavity was mildly enlarged. There is no increase in right ventricular wall thickness. Right ventricular systolic pressure is normal with an estimated pressure of 26.2 mmHg.  3. Left atrial size was mildly dilated.  4. S/P 29mm Sorin mechanical MVR which appears to be functioning normally. There is no perivalvular MR. The mean MVG is 7mmHg.  5. The aorta is normal in size and structure.  FINDINGS  Left Ventricle: The left ventricle has low normal systolic function, with an ejection fraction of 50-55%. The cavity size was normal. There is no increase in left ventricular wall thickness. Left ventricular diastolic function could not be evaluated due  to indeterminate diastolic function. No evidence of left ventricular regional wall motion abnormalities..  Right Ventricle: The right ventricle has normal systolic function. The cavity was mildly enlarged. There is no increase in right ventricular wall thickness. Right ventricular systolic pressure is normal with an estimated pressure of 26.2 mmHg.  Left Atrium: Left atrial size was mildly dilated.  Right Atrium: Right atrial size was normal in size. Right atrial pressure is estimated at 3 mmHg.  Interatrial Septum: No atrial level shunt detected by color flow Doppler.  Pericardium: There is no evidence of pericardial effusion.  Mitral Valve:  The mitral valve has been repaired/replaced. Mitral valve regurgitation was not assessed by color flow Doppler. S/P 29mm Sorin mechanical MVR which appears to be functioning normally. There is no perivalvular MR. The mean MVG is 7mmHg.  Tricuspid Valve: The tricuspid valve is normal in structure. Tricuspid valve regurgitation is trivial by color flow Doppler.  Aortic Valve: The aortic valve is normal in structure. Aortic valve regurgitation is trivial by color flow Doppler.  Pulmonic Valve: The pulmonic valve was normal in structure. Pulmonic valve regurgitation is not visualized by color flow Doppler.  Aorta: The aorta is normal in size and structure.  Venous: The inferior vena cava measures 1.52 cm, is normal in size with greater than 50% respiratory variability.    +--------------+--------++  LEFT VENTRICLE            +----------------+----------++ +--------------+--------++  Diastology                     PLAX 2D                   +----------------+----------++ +--------------+--------++  LV e' lateral:   15.10 cm/s    LVIDd:         4.08 cm    +----------------+----------++ +--------------+--------++  LV E/e' lateral: 11.6  LVIDs:         2.93 cm    +----------------+----------++ +--------------+--------++  LV e' medial:    13.20 cm/s    LV PW:         0.99 cm    +----------------+----------++ +--------------+--------++  LV E/e' medial:  13.2          LV IVS:        0.93 cm    +----------------+----------++ +--------------+--------++  LVOT diam:     2.40 cm    +--------------+--------++  LV SV:         40 ml      +--------------+--------++  LV SV Index:   18.30      +--------------+--------++  LVOT Area:     4.52 cm   +--------------+--------++                            +--------------+--------++  +---------------+----------++  RIGHT VENTRICLE              +---------------+----------++  RV S prime:     11.90 cm/s   +---------------+----------++  TAPSE  (M-mode): 1.3 cm       +---------------+----------++  RVSP:           26.2 mmHg    +---------------+----------++  +---------------+-------++-----------++  LEFT ATRIUM              Index         +---------------+-------++-----------++  LA diam:        4.60 cm  2.11 cm/m    +---------------+-------++-----------++  LA Vol (A2C):   89.2 ml  40.87 ml/m   +---------------+-------++-----------++  LA Vol (A4C):   57.4 ml  26.30 ml/m   +---------------+-------++-----------++  LA Biplane Vol: 73.1 ml  33.49 ml/m   +---------------+-------++-----------++ +------------+---------++-----------++  RIGHT ATRIUM            Index         +------------+---------++-----------++  RA Pressure: 3.00 mmHg                +------------+---------++-----------++  RA Area:     15.40 cm                +------------+---------++-----------++  RA Volume:   40.00 ml   18.33 ml/m   +------------+---------++-----------++  +------------+-----------++  AORTIC VALVE               +------------+-----------++  LVOT Vmax:   73.10 cm/s    +------------+-----------++  LVOT Vmean:  52.200 cm/s   +------------+-----------++  LVOT VTI:    0.116 m       +------------+-----------++   +-------------+-------++  AORTA                   +-------------+-------++  Ao Root diam: 3.40 cm   +-------------+-------++  Ao Asc diam:  3.00 cm   +-------------+-------++  +--------------+----------++  +---------------+-----------++  MITRAL VALVE                  TRICUSPID VALVE               +--------------+----------++  +---------------+-----------++  MV Area (PHT): 5.24 cm       TR Peak grad:   23.2 mmHg     +--------------+----------++  +---------------+-----------++  MV Peak grad:  13.5 mmHg      TR Vmax:        269.00 cm/s   +--------------+----------++  +---------------+-----------++  MV Mean grad:  7.0 mmHg  Estimated RAP:  3.00 mmHg     +--------------+----------++  +---------------+-----------++  MV Vmax:        1.84 m/s       RVSP:           26.2 mmHg     +--------------+----------++  +---------------+-----------++  MV Vmean:      115.0 cm/s   +--------------+----------++  +--------------+-------+  MV VTI:        0.27 m         SHUNTS                  +--------------+----------++  +--------------+-------+  MV PHT:        42 msec        Systemic VTI:  0.12 m   +--------------+----------++  +--------------+-------+  MV Decel Time: 150 msec       Systemic Diam: 2.40 cm  +--------------+----------++  +--------------+-------+ +--------------+-----------++  MV E velocity: 174.50 cm/s   +--------------+-----------++  +---------+-------+  IVC                +---------+-------+  IVC diam: 1.52 cm  +---------+-------+    Armanda Magic MD Electronically signed by Armanda Magic MD Signature Date/Time: 06/23/2019/3:26:19 PM      CHEST - 2 VIEW  COMPARISON:  06/27/2019  FINDINGS: The heart size and mediastinal contours are within normal limits. Surgical mitral valve prosthesis. Both lungs are clear. Trace bilateral pleural effusions. The visualized skeletal structures are unremarkable.  IMPRESSION: Trace bilateral pleural effusions. No focal airspace opacity. Surgical mitral valve prosthesis.   Electronically Signed   By: Lauralyn Primes M.D.   On: 07/05/2019 17:34    Impression:  Overall patient seems to be doing reasonably well although he has continued vague complaints including vague atypical discomfort across his chest radiating to his back and tachycardia with recent fatigue.  I have personally reviewed the patient's last several twelve-lead EKGs.  The patient has regular, narrow complex rhythm.  It is possible this is atrial flutter or ectopic atrial tachycardia.  No clear P waves are seen.  He does not have signs of congestive heart failure and recent transthoracic echocardiogramed look quite good.   Plan:  Will resume metoprolol at one half the patient's  preoperative dose.  Patient will need to follow-up in Dr. Hazle Coca office within the next week for rhythm check.  We will also obtain CT angiogram of the chest just to be on 100% sure patient does not have an aortic dissection.  The patient will return to our office for follow-up in approximately 4 weeks.  We will also follow-up results of blood cultures obtained in the emergency department last night.    Salvatore Decent. Cornelius Moras, MD 07/06/2019 12:11 PM

## 2019-07-06 NOTE — Patient Instructions (Signed)
Resume taking metoprolol 25 mg (1/2 tablet) by mouth twice daily  Continue all other previous medications without any changes at this time  Endocarditis is a potentially serious infection of heart valves or inside lining of the heart.  It occurs more commonly in patients with diseased heart valves (such as patient's with aortic or mitral valve disease) and in patients who have undergone heart valve repair or replacement.  Certain surgical and dental procedures may put you at risk, such as dental cleaning, other dental procedures, or any surgery involving the respiratory, urinary, gastrointestinal tract, gallbladder or prostate gland.   To minimize your chances for develooping endocarditis, maintain good oral health and seek prompt medical attention for any infections involving the mouth, teeth, gums, skin or urinary tract.    Always notify your doctor or dentist about your underlying heart valve condition before having any invasive procedures. You will need to take antibiotics before certain procedures, including all routine dental cleanings or other dental procedures.  Your cardiologist or dentist should prescribe these antibiotics for you to be taken ahead of time.

## 2019-07-06 NOTE — Telephone Encounter (Signed)
Patient called and scheduled for 07/07/19 for in-office visit with Dr. Gwenlyn Found

## 2019-07-06 NOTE — Telephone Encounter (Addendum)
Spoke with  Mr Toso  As he saw Dr Roxy Manns this morning and has a follow up appointment with Dr Gwenlyn Found tomorrow. Will hold with exercise at cardiac rehab until cleared to return by Dr Gwenlyn Found. Patient states understanding.[name], RN,BSN 07/06/2019 3:49 PM

## 2019-07-06 NOTE — Telephone Encounter (Signed)
In ED last night, INR low at 2.0.  Was told to take 2 tablets then call for directions.  See anti-coag encounter dated today

## 2019-07-07 ENCOUNTER — Encounter: Payer: Self-pay | Admitting: Cardiovascular Disease

## 2019-07-07 ENCOUNTER — Ambulatory Visit (INDEPENDENT_AMBULATORY_CARE_PROVIDER_SITE_OTHER): Payer: BC Managed Care – PPO | Admitting: Cardiovascular Disease

## 2019-07-07 ENCOUNTER — Other Ambulatory Visit: Payer: Self-pay

## 2019-07-07 DIAGNOSIS — R000 Tachycardia, unspecified: Secondary | ICD-10-CM

## 2019-07-07 NOTE — Assessment & Plan Note (Signed)
Mr. Darren Haynes returns today for follow-up of tachycardia.  He did have an event monitor that showed sinus rhythm/sinus tachycardia without any arrhythmias.  He had occasional PVCs.  This was performed on 05/25/2019.  He recently saw Dr. Roxy Manns in the office yesterday who noted tachycardia as well with difficult to recognize P waves and began him on metoprolol 25 mg p.o. twice daily which has improved his heart rate as well as his clinical symptoms.  We will continue to monitor him and determine whether or not he needs titration of this medication.

## 2019-07-07 NOTE — Addendum Note (Signed)
Addended by: Annita Brod on: 07/07/2019 04:17 PM   Modules accepted: Orders

## 2019-07-07 NOTE — Patient Instructions (Signed)
Medication Instructions:  Your physician recommends that you continue on your current medications as directed. Please refer to the Current Medication list given to you today.  If you need a refill on your cardiac medications before your next appointment, please call your pharmacy.   Lab work: none If you have labs (blood work) drawn today and your tests are completely normal, you will receive your results only by: [name] Kitchen MyChart Message (if you have MyChart) OR . A paper copy in the mail If you have any lab test that is abnormal or we need to change your treatment, we will call you to review the results.  Testing/Procedures: none  Follow-Up: At [facility], you and your health needs are our priority.  As part of our continuing mission to provide you with exceptional heart care, we have created designated Provider Care Teams.  These Care Teams include your primary Cardiologist (physician) and Advanced Practice Providers (APPs -  Physician Assistants and Nurse Practitioners) who all work together to provide you with the care you need, when you need it. . You will need a follow up appointment in 6 weeks with an APP and in 3 months Dr. Quay Burow.  Please call our office 2 months in advance to schedule this/each appointment.  You may see one of the following Advanced Practice Providers on your designated Care Team:   . Kerin Ransom, PA-C . Daleen Snook Kroeger, PA-C . Sande Rives, PA-C . Almyra Deforest, PA-C . Fabian Sharp, PA-C . Jory Sims, DNP . Rosaria Ferries, PA-C

## 2019-07-07 NOTE — Progress Notes (Signed)
07/07/2019 Darren Haynes   December 02, 1985  [id number]  [place], [place] [facility] Cardiologist: Lorretta Harp MD FACP, [place], [place], [place]  HPI:  Darren Haynes is a 33 y.o. thin  appearing married Caucasian male father 2 children who did work in Biomedical scientist but has not worked in the last 9 months because of progressive dyspnea on exertion. He was referred by Dr. Claudie Leach for evaluation of symptomatic mitral stenosis. I last saw him in the office  06/16/2019.  There is no history of rheumatic fever. He has no other cardiac risk factors. He is had progressive dyspnea for the last 9 months with some nonspecific aches and pains. Is being worked up by rheumatologist. Echo performed 11/21/2018 revealed normal LV systolic function, a peak mitral gradient of 47 mmHg with a mean of 29 mmHg and mitral valve area 1.5 cm with a PA pressure of 69 mmHg. He does have severe dyspnea and lower extreme edema. He is on oral diuretic.He presented today for transesophageal echocardiogram performed Dr. 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  I did right left heart cath revealing a significant mitral valve gradient,and normal coronary arteries.Ireferred him to Dr. Mina Marble at [facility] who performed successful mitral balloon valvuloplasty with almost immediate improvement in his pulmonary artery pressures. 12/25/2018.Hefeltclinically improved.  Because of increasing symptoms of mitral stenosis I referred him to Dr. Roxy Manns who ultimately performed mitral valve replacement with a Sorin [employer] bileaflet mechanical valve (29 mm) done minimally invasively on 05/14/2019.    He has done well postoperatively except for volume overload controlled with oral diuretics and tachycardia.  Since I saw him 3 weeks ago I did obtain a 2D echo 06/23/2019 revealed normal LV size and function, no evidence of pericardial effusion with a well-functioning aortic  mechanical prosthesis.  An event monitor performed 05/25/2019 showed sinus rhythm/sinus tachycardia with occasional PVCs.  He did see Dr. Roxy Manns in the office yesterday complaining of tachycardia.  EKG did reveal heart rate of 116 with difficult to appreciate P waves and because of this he was begun on low-dose beta-blockade.  Today he is in sinus rhythm and a heart rate of 89 with clear P waves.    Current Meds  Medication Sig   acetaminophen (TYLENOL) 500 MG tablet Take 2 tablets (1,000 mg total) by mouth every 6 (six) hours.   ALPRAZolam (NIRAVAM) 1 MG dissolvable tablet Take 0.5 mg by mouth 3 (three) times daily as needed for anxiety.    aspirin EC 81 MG tablet Take 81 mg by mouth daily.   benztropine (COGENTIN) 0.5 MG tablet Take 0.5 mg by mouth 2 (two) times daily.   Buprenorphine HCl (BELBUCA) 750 MCG FILM Place 750 mcg inside cheek 2 (two) times daily.    busPIRone (BUSPAR) 7.5 MG tablet Take 7.5 mg by mouth 3 (three) times daily.   clonazePAM (KLONOPIN) 0.5 MG tablet Take 0.5 mg by mouth at bedtime.   famotidine (PEPCID) 40 MG tablet Take 40 mg by mouth daily.   fluticasone-salmeterol (ADVAIR HFA) 45-21 MCG/ACT inhaler Inhale 2 puffs into the lungs 2 (two) times daily.   furosemide (LASIX) 40 MG tablet Take 1 tablet (40 mg total) by mouth daily.   gabapentin (NEURONTIN) 300 MG capsule Take 600 mg by mouth 3 (three) times daily.    leflunomide (ARAVA) 20 MG tablet Take 20 mg by mouth daily.   MELATONIN PO Take 1 tablet by mouth at bedtime as  needed (sleep).   nortriptyline (PAMELOR) 25 MG capsule Take 25 mg by mouth at bedtime.    oxyCODONE-acetaminophen (PERCOCET) 7.5-325 MG tablet Take 1 tablet by mouth every 4 (four) hours as needed for severe pain.   penicillin v potassium (VEETID) 250 MG tablet Take 250 mg by mouth 2 (two) times a day.   potassium chloride (K-DUR) 10 MEQ tablet Take 1 tablet (10 mEq total) by mouth daily.   sertraline (ZOLOFT) 100 MG tablet Take  100 mg by mouth at bedtime.    tiZANidine (ZANAFLEX) 4 MG tablet Take 4 mg by mouth 3 (three) times daily.   Vitamin D, Ergocalciferol, (DRISDOL) 1.25 MG (50000 UT) CAPS capsule Take 50,000 Units by mouth every Tuesday.    warfarin (COUMADIN) 2.5 MG tablet Take 1 to 1.5 tablets by mouth daily as directed.   Current Facility-Administered Medications for the 07/07/19 encounter (Office Visit) with Runell Gess, MD  Medication   metoprolol tartrate (LOPRESSOR) tablet 25 mg     No Known Allergies  Social History   Socioeconomic History   Marital status: Married    Spouse name: Not on file   Number of children: Not on file   Years of education: Not on file   Highest education level: Not on file  Occupational History   Not on file  Social Needs   Financial resource strain: Not on file   Food insecurity    Worry: Not on file    Inability: Not on file   Transportation needs    Medical: Not on file    Non-medical: Not on file  Tobacco Use   Smoking status: Never Smoker   Smokeless tobacco: Current User    Types: Snuff  Substance and Sexual Activity   Alcohol use: No   Drug use: No   Sexual activity: Not on file  Lifestyle   Physical activity    Days per week: Not on file    Minutes per session: Not on file   Stress: Not on file  Relationships   Social connections    Talks on phone: Not on file    Gets together: Not on file    Attends religious service: Not on file    Active member of club or organization: Not on file    Attends meetings of clubs or organizations: Not on file    Relationship status: Not on file   Intimate partner violence    Fear of current or ex partner: Not on file    Emotionally abused: Not on file    Physically abused: Not on file    Forced sexual activity: Not on file  Other Topics Concern   Not on file  Social History Narrative   Not on file     Review of Systems: General: negative for chills, fever, night sweats or  weight changes.  Cardiovascular: negative for chest pain, dyspnea on exertion, edema, orthopnea, palpitations, paroxysmal nocturnal dyspnea or shortness of breath Dermatological: negative for rash Respiratory: negative for cough or wheezing Urologic: negative for hematuria Abdominal: negative for nausea, vomiting, diarrhea, bright red blood per rectum, melena, or hematemesis Neurologic: negative for visual changes, syncope, or dizziness All other systems reviewed and are otherwise negative except as noted above.    Pulse 95, temperature (!) 97.5 F (36.4 C), height 6\' 2"  (1.88 m), weight 206 lb 6.4 oz (93.6 kg), SpO2 99 %.  General appearance: alert and no distress Neck: no adenopathy, no carotid bruit, no JVD, supple, symmetrical, trachea  midline and thyroid not enlarged, symmetric, no tenderness/mass/nodules Lungs: clear to auscultation bilaterally Heart: Crisp prosthetic valve sounds Extremities: extremities normal, atraumatic, no cyanosis or edema Pulses: 2+ and symmetric Skin: Skin color, texture, turgor normal. No rashes or lesions Neurologic: Alert and oriented X 3, normal strength and tone. Normal symmetric reflexes. Normal coordination and gait  EKG sinus rhythm at 89 without ST or T wave changes.  I personally reviewed this EKG  ASSESSMENT AND PLAN:   Heart rate fast Mr. Munyon returns today for follow-up of tachycardia.  He did have an event monitor that showed sinus rhythm/sinus tachycardia without any arrhythmias.  He had occasional PVCs.  This was performed on 05/25/2019.  He recently saw Dr. Cornelius Moras in the office yesterday who noted tachycardia as well with difficult to recognize P waves and began him on metoprolol 25 mg p.o. twice daily which has improved his heart rate as well as his clinical symptoms.  We will continue to monitor him and determine whether or not he needs titration of this medication.      Runell Gess MD FACP,FACC,FAHA, [facility] 07/07/2019 10:54 AM

## 2019-07-08 ENCOUNTER — Encounter (HOSPITAL_COMMUNITY): Payer: BC Managed Care – PPO

## 2019-07-10 ENCOUNTER — Ambulatory Visit
Admission: RE | Admit: 2019-07-10 | Discharge: 2019-07-10 | Disposition: A | Discharge status: Home / Self Care | Payer: BC Managed Care – PPO | Source: Ambulatory Visit | Attending: Thoracic Surgery (Cardiothoracic Vascular Surgery) | Admitting: Thoracic Surgery (Cardiothoracic Vascular Surgery)

## 2019-07-10 ENCOUNTER — Encounter (HOSPITAL_COMMUNITY): Payer: BC Managed Care – PPO

## 2019-07-10 DIAGNOSIS — Z954 Presence of other heart-valve replacement: Secondary | ICD-10-CM

## 2019-07-10 LAB — CULTURE, BLOOD (ROUTINE X 2)
Culture: NO GROWTH
Culture: NO GROWTH
Special Requests: ADEQUATE
Special Requests: ADEQUATE

## 2019-07-10 MED ORDER — IOPAMIDOL (ISOVUE-370) INJECTION 76%
75.0000 mL | Freq: Once | INTRAVENOUS | Status: AC | PRN
  Administered 2019-07-10: 75 mL via INTRAVENOUS

## 2019-07-13 ENCOUNTER — Telehealth (HOSPITAL_COMMUNITY): Payer: Self-pay | Admitting: *Deleted

## 2019-07-13 ENCOUNTER — Encounter (HOSPITAL_COMMUNITY)
Admission: RE | Admit: 2019-07-13 | Discharge: 2019-07-13 | Disposition: A | Discharge status: Home / Self Care | Payer: BC Managed Care – PPO | Source: Ambulatory Visit | Attending: Cardiovascular Disease | Admitting: Cardiovascular Disease

## 2019-07-13 ENCOUNTER — Other Ambulatory Visit: Payer: Self-pay

## 2019-07-13 ENCOUNTER — Telehealth: Payer: Self-pay | Admitting: Cardiovascular Disease

## 2019-07-13 ENCOUNTER — Ambulatory Visit (INDEPENDENT_AMBULATORY_CARE_PROVIDER_SITE_OTHER): Payer: BC Managed Care – PPO | Admitting: Pharmacist

## 2019-07-13 DIAGNOSIS — I050 Rheumatic mitral stenosis: Secondary | ICD-10-CM | POA: Diagnosis not present

## 2019-07-13 DIAGNOSIS — Z954 Presence of other heart-valve replacement: Secondary | ICD-10-CM

## 2019-07-13 LAB — POCT INR: INR: 2.4 (ref 2.0–3.0)

## 2019-07-13 NOTE — Telephone Encounter (Signed)
Can resume exercising cardiac rehab

## 2019-07-13 NOTE — Telephone Encounter (Signed)
Patient notified that he has been cleared to return to exercise at cardiac rehab. Darren Haynes plans to return on Wednesday.[name], RN,BSN 07/13/2019 8:58 AM

## 2019-07-13 NOTE — Telephone Encounter (Signed)
-----   Message from Lorretta Harp, MD sent at 07/13/2019  8:45 AM EDT ----- Regarding: RE: Return to exercise Yes, OK to return to exercise  JJB ----- Message ----- From: Magda Kiel, RN Sent: 07/13/2019   8:24 AM EDT To: Lorretta Harp, MD Subject: Return to exercise                             Good morning Dr Gwenlyn Found,  Was checking to see if you are okay with Mr Lanzo returning to exercise at cardiac rehab. As he completed his CT scan on Friday and saw him in the office last week.   Thanks for your input!   Sincerely,  [name] RN Cardiac Rehab

## 2019-07-13 NOTE — Telephone Encounter (Signed)
° ° °  Darren Haynes from Cardiac Rehab requesting to speak with Dr Gwenlyn Found (sending him a in basket msg) Would like to know if patient can resume exercise today.

## 2019-07-13 NOTE — Telephone Encounter (Signed)
Patient came to cardiac rehab. Will check with Dr Gwenlyn Found about the patient returning to exercise. Patient states understanding.[name], RN,BSN 07/13/2019 8:41 AM

## 2019-07-14 NOTE — Telephone Encounter (Signed)
Left detailed message for [place]

## 2019-07-15 ENCOUNTER — Encounter (HOSPITAL_COMMUNITY)
Admission: RE | Admit: 2019-07-15 | Discharge: 2019-07-15 | Disposition: A | Discharge status: Home / Self Care | Payer: BC Managed Care – PPO | Source: Ambulatory Visit | Attending: Cardiovascular Disease | Admitting: Cardiovascular Disease

## 2019-07-15 ENCOUNTER — Other Ambulatory Visit: Payer: Self-pay

## 2019-07-15 DIAGNOSIS — Z954 Presence of other heart-valve replacement: Secondary | ICD-10-CM | POA: Insufficient documentation

## 2019-07-15 NOTE — Progress Notes (Signed)
Allie returned to exercise at cardiac rehab today. Resting heart rate 98. Telemetry rhythm Sinus with a first degree heart block. Medication changes noted.Will continue to monitor the patient throughout  the program.Trooper Olander Venetia Maxon, RN,BSN 07/15/2019 9:26 AM

## 2019-07-17 ENCOUNTER — Encounter (HOSPITAL_COMMUNITY)
Admission: RE | Admit: 2019-07-17 | Discharge: 2019-07-17 | Disposition: A | Discharge status: Home / Self Care | Payer: BC Managed Care – PPO | Source: Ambulatory Visit | Attending: Cardiovascular Disease | Admitting: Cardiovascular Disease

## 2019-07-17 ENCOUNTER — Other Ambulatory Visit: Payer: Self-pay

## 2019-07-17 DIAGNOSIS — Z954 Presence of other heart-valve replacement: Secondary | ICD-10-CM

## 2019-07-22 ENCOUNTER — Other Ambulatory Visit: Payer: Self-pay

## 2019-07-22 ENCOUNTER — Encounter (HOSPITAL_COMMUNITY)
Admission: RE | Admit: 2019-07-22 | Discharge: 2019-07-22 | Disposition: A | Discharge status: Home / Self Care | Payer: BC Managed Care – PPO | Source: Ambulatory Visit | Attending: Cardiovascular Disease | Admitting: Cardiovascular Disease

## 2019-07-22 DIAGNOSIS — Z954 Presence of other heart-valve replacement: Secondary | ICD-10-CM

## 2019-07-23 ENCOUNTER — Ambulatory Visit (INDEPENDENT_AMBULATORY_CARE_PROVIDER_SITE_OTHER): Payer: BC Managed Care – PPO | Admitting: Pharmacist

## 2019-07-23 ENCOUNTER — Ambulatory Visit (INDEPENDENT_AMBULATORY_CARE_PROVIDER_SITE_OTHER): Payer: BC Managed Care – PPO | Admitting: Physician Assistant

## 2019-07-23 ENCOUNTER — Encounter: Payer: Self-pay | Admitting: Physician Assistant

## 2019-07-23 VITALS — BP 128/88 | HR 92 | Ht 74.0 in | Wt 211.0 lb

## 2019-07-23 DIAGNOSIS — I050 Rheumatic mitral stenosis: Secondary | ICD-10-CM

## 2019-07-23 DIAGNOSIS — Z954 Presence of other heart-valve replacement: Secondary | ICD-10-CM

## 2019-07-23 DIAGNOSIS — I480 Paroxysmal atrial fibrillation: Secondary | ICD-10-CM | POA: Diagnosis not present

## 2019-07-23 DIAGNOSIS — M87059 Idiopathic aseptic necrosis of unspecified femur: Secondary | ICD-10-CM

## 2019-07-23 DIAGNOSIS — I5032 Chronic diastolic (congestive) heart failure: Secondary | ICD-10-CM | POA: Diagnosis not present

## 2019-07-23 LAB — POCT INR: INR: 3.5 — AB (ref 2.0–3.0)

## 2019-07-23 MED ORDER — FUROSEMIDE 40 MG PO TABS: 40.0000 mg | ORAL_TABLET | Freq: Every day | ORAL | 3 refills | Status: AC

## 2019-07-23 NOTE — Patient Instructions (Signed)
Medication Instructions:  YOU MAKE TAKE AN EXTRA 20MG  OF LASIX AS NEEDED FOR VOLUME OVERLOAD If you need a refill on your cardiac medications before your next appointment, please call your pharmacy.   Lab work: None  If you have labs (blood work) drawn today and your tests are completely normal, you will receive your results only by: [name] Kitchen MyChart Message (if you have MyChart) OR . A paper copy in the mail If you have any lab test that is abnormal or we need to change your treatment, we will call you to review the results.  Testing/Procedures: None   Follow-Up: At [facility], you and your health needs are our priority.  As part of our continuing mission to provide you with exceptional heart care, we have created designated Provider Care Teams.  These Care Teams include your primary Cardiologist (physician) and Advanced Practice Providers (APPs -  Physician Assistants and Nurse Practitioners) who all work together to provide you with the care you need, when you need it. . FOLLOW UP WITH DR BERRY AS SCHEDULED   Any Other Special Instructions Will Be Listed Below (If Applicable).

## 2019-07-23 NOTE — Progress Notes (Signed)
Cardiology Office Note    Date:  07/24/2019   ID:  Darren Haynes, DOB [birth year], MRN [id number]  PCP:  [facility]  Cardiologist:  Dr. Gwenlyn Found  Chief Complaint  Patient presents with   Follow-up    seen for Dr. Gwenlyn Found.    History of Present Illness:  Darren Haynes is a 33 y.o. 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  He was first seen by Dr. Gwenlyn Found in January 2020, echocardiogram performed at the time demonstrated normal LV function, peak mitral gradient of 47 mmHg, with mean gradient of 29 mmHg.  Mitral valve area 1.5 cm.  He had severe dyspnea and lower extremity edema.  TEE demonstrated classic rheumatic mitral valve stenosis with mean gradient of 15 mmHg.  Left and right heart cath revealed significant mitral valve gradient, normal coronaries.  He was referred to Dr. Jacelyn Grip at [facility] who performed a successful mitral balloon valvuloplasty in February 2020 with immediate improvement in his pulmonary artery pressure and symptom for the next 2 months.  However he returned in April with progressive dyspnea and lower extremity edema.  TEE obtained on 03/27/2019 demonstrated moderate to severe MS.  Echocardiogram revealed mild LV dysfunction with EF of 45 to 50%.  Event monitor showed PACs, PVCs and episode of PSVT and NSVT.  Patient was referred to cardiothoracic surgery for mitral valve replacement.  Due to progressive worsening symptom of mitral stenosis, he eventually underwent mitral valve replacement with a Sorin [employer] bileaflet mechanical valve 29 mm performed on 05/14/2019.  Postop course complicated by volume overload and postop A. fib which he converted to sinus rhythm on IV amiodarone therapy.  On postop day #2, he had an episode of complete heart block.  Lopressor PRN was discontinued.  He was discharged on 30-day event monitor.   Amiodarone therapy was also discontinued as well.  Echocardiogram obtained on 06/23/2019 revealed a normal LV function, no evidence of pericardial effusion with well functioning aortic mechanical prosthesis. He was started on low-dose beta-blocker due to tachycardia.  He was seen by Dr. Gwenlyn Found on 07/07/2019, heart rate at the time was 82 bpm.  He was seen by neurology at [facility] in August for diplopia.  According to the initial consultation note, based on his clinical symptom, image finding and CSF result, it is very unlikely this patient has multiple sclerosis.  He was referred to ophthalmology service.  Note, patient is also been seen by sports medicine at [facility] for bilateral hip pain.  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  He was deemed not a candidate for free vascularized fibular graft.  Additional alternative treatment is being sought after at this point including core decompression and possible hip arthroplasty.  Since then, he has been seen in the ED for atypical chest pain.  CT angiogram of the chest obtained on 07/02/2019 was negative for PE, mechanical MVR without apparent complication, linear scarring or atelectasis in the right upper and lower lobe.  He presents today for cardiology office visit.  Despite his weight gain, he appears to be euvolemic on physical exam even though patient felt he may have picked up a little fluid.  He still occasionally has some chest discomfort.  Otherwise he denies any orthopnea or PND.  He has bilateral hip pain and is currently under evaluation for potential surgery.  I recommend he check with CT  surgery to see what is the earliest time he may have surgery after mechanical mitral valve replacement.  Once he figure out the timing of the surgery, he will need to be seen by the Coumadin clinic at least 5 days prior to the procedure for consideration of Lovenox bridging.   Past Medical History:  Diagnosis Date   Arthritis     Avascular necrosis (HCC)    CHF (congestive heart failure) (HCC)    Chronic back pain    Depression    Dyspnea    GERD (gastroesophageal reflux disease)    Heart murmur    Mitral valve stenosis    Panic attacks    Pneumonia    hx 4 time   Pulmonary hypertension (HCC)    Rheumatic fever    S/P minimally invasive mitral valve replacement with mechanical valve 05/14/2019   29 mm Sorin [employer] bileaflet mechanical valve via right mini thoracotomy approach    Past Surgical History:  Procedure Laterality Date   BALLOON VALVULOPLASTY  12/24/2018   [facility]   MITRAL VALVE REPLACEMENT Right 05/14/2019   Procedure: MINIMALLY INVASIVE MITRAL VALVE (MV) REPLACEMENT USING CARBOMEDICS OPTIFORM Size 29mm;  Surgeon: Purcell Nailswen, Clarence H, MD;  Location: [facility] OR;  Service: Open Heart Surgery;  Laterality: Right;   RIGHT/LEFT HEART CATH AND CORONARY ANGIOGRAPHY N/A 12/11/2018   Procedure: RIGHT/LEFT HEART CATH AND CORONARY ANGIOGRAPHY;  Surgeon: Runell GessBerry, Jonathan J, MD;  Location: [facility] INVASIVE CV LAB;  Service: Cardiovascular;  Laterality: N/A;   TEE WITHOUT CARDIOVERSION N/A 12/11/2018   Procedure: TRANSESOPHAGEAL ECHOCARDIOGRAM (TEE);  Surgeon: Thurmon Fairroitoru, Mihai, MD;  [facility];  Service: Cardiovascular;  Laterality: N/A;   TEE WITHOUT CARDIOVERSION N/A 03/27/2019   Procedure: TRANSESOPHAGEAL ECHOCARDIOGRAM (TEE);  Surgeon: Elease HashimotoNahser, Deloris PingPhilip J, MD;  [facility];  Service: Cardiovascular;  Laterality: N/A;   TEE WITHOUT CARDIOVERSION N/A 05/14/2019   Procedure: TRANSESOPHAGEAL ECHOCARDIOGRAM (TEE);  Surgeon: Purcell Nailswen, Clarence H, MD;  [facility];  Service: Open Heart Surgery;  Laterality: N/A;   TYMPANOSTOMY TUBE PLACEMENT      Current Medications: Outpatient Medications Prior to Visit  Medication Sig Dispense Refill   albuterol (PROVENTIL) (2.5 MG/3ML) 0.083% nebulizer solution USE 1 NEBULE QID PRN     albuterol (VENTOLIN HFA) 108 (90 Base)  MCG/ACT inhaler INHALE 1-2 PUFFS QID PRN     ALPRAZolam (NIRAVAM) 1 MG dissolvable tablet Take 0.5 mg by mouth 3 (three) times daily as needed for anxiety.      aspirin EC 81 MG tablet Take 81 mg by mouth daily.     benztropine (COGENTIN) 0.5 MG tablet Take 0.5 mg by mouth 2 (two) times daily.     Buprenorphine HCl (BELBUCA) 750 MCG FILM Place 750 mcg inside cheek 2 (two) times daily.      busPIRone (BUSPAR) 7.5 MG tablet Take 7.5 mg by mouth 3 (three) times daily.     celecoxib (CELEBREX) 200 MG capsule Take by mouth.     Cholecalciferol (VITAMIN D-1000 MAX ST) 25 MCG (1000 UT) tablet Take by mouth.     clonazePAM (KLONOPIN) 0.5 MG tablet Take 0.5 mg by mouth at bedtime.     diclofenac sodium (VOLTAREN) 1 % GEL Apply topically.     famotidine (PEPCID) 40 MG tablet Take 40 mg by mouth daily.     fluticasone-salmeterol (ADVAIR HFA) 45-21 MCG/ACT inhaler Inhale 2 puffs into the lungs 2 (two) times daily.     gabapentin (NEURONTIN) 300 MG capsule Take 600 mg  by mouth 3 (three) times daily.      leflunomide (ARAVA) 20 MG tablet Take 20 mg by mouth daily.     MELATONIN PO Take 1 tablet by mouth at bedtime as needed (sleep).     Melatonin POWD Take by mouth.     metoprolol succinate (TOPROL-XL) 50 MG 24 hr tablet Take 25 mg by mouth 2 (two) times daily.     nicotine polacrilex (NICORETTE) 4 MG gum      nortriptyline (PAMELOR) 25 MG capsule Take 25 mg by mouth at bedtime.      Oxycodone HCl 10 MG TABS      oxyCODONE-acetaminophen (PERCOCET) 7.5-325 MG tablet Take 1 tablet by mouth every 4 (four) hours as needed for severe pain.     penicillin v potassium (VEETID) 250 MG tablet Take 250 mg by mouth 2 (two) times a day.     potassium chloride (K-DUR) 10 MEQ tablet Take 1 tablet (10 mEq total) by mouth daily. 7 tablet 0   sertraline (ZOLOFT) 100 MG tablet Take 100 mg by mouth at bedtime.      tiZANidine (ZANAFLEX) 4 MG tablet Take 4 mg by mouth 3 (three) times daily.      Vitamin D, Ergocalciferol, (DRISDOL) 1.25 MG (50000 UT) CAPS capsule Take 50,000 Units by mouth every Tuesday.      warfarin (COUMADIN) 2.5 MG tablet Take 1 to 1.5 tablets by mouth daily as directed. 135 tablet 0   furosemide (LASIX) 40 MG tablet Take 1 tablet (40 mg total) by mouth daily. 7 tablet 0   acetaminophen (TYLENOL) 500 MG tablet Take 2 tablets (1,000 mg total) by mouth every 6 (six) hours. (Patient not taking: Reported on 07/23/2019) 30 tablet 0   Facility-Administered Medications Prior to Visit  Medication Dose Route Frequency Provider Last Rate Last Dose   metoprolol tartrate (LOPRESSOR) tablet 25 mg  25 mg Oral BID Purcell Nailswen, Clarence H, MD         Allergies:   Patient has no known allergies.   Social History   Socioeconomic History   Marital status: Married    Spouse name: Not on file   Number of children: Not on file   Years of education: Not on file   Highest education level: Not on file  Occupational History   Not on file  Social Needs   Financial resource strain: Not on file   Food insecurity    Worry: Not on file    Inability: Not on file   Transportation needs    Medical: Not on file    Non-medical: Not on file  Tobacco Use   Smoking status: Never Smoker   Smokeless tobacco: Current User    Types: Snuff  Substance and Sexual Activity   Alcohol use: No   Drug use: No   Sexual activity: Not on file  Lifestyle   Physical activity    Days per week: Not on file    Minutes per session: Not on file   Stress: Not on file  Relationships   Social connections    Talks on phone: Not on file    Gets together: Not on file    Attends religious service: Not on file    Active member of club or organization: Not on file    Attends meetings of clubs or organizations: Not on file    Relationship status: Not on file  Other Topics Concern   Not on file  Social History Narrative   Not on  file     Family History:  The patient's family history  includes Depression in his mother; Heart disease in his mother; Hyperlipidemia in his father; Hypertension in his father.   ROS:   Please see the history of present illness.    ROS All other systems reviewed and are negative.   PHYSICAL EXAM:   VS:  BP 128/88    Pulse 92    Ht 6\' 2"  (1.88 m)    Wt 211 lb (95.7 kg)    BMI 27.09 kg/m    GEN: Well nourished, well developed, in no acute distress  HEENT: normal  Neck: no JVD, carotid bruits, or masses Cardiac: RRR; no murmurs, rubs, or gallops,no edema  Respiratory:  clear to auscultation bilaterally, normal work of breathing GI: soft, nontender, nondistended, + BS MS: no deformity or atrophy  Skin: warm and dry, no rash Neuro:  Alert and Oriented x 3, Strength and sensation are intact Psych: euthymic mood, full affect  Wt Readings from Last 3 Encounters:  07/23/19 211 lb (95.7 kg)  07/07/19 206 lb 6.4 oz (93.6 kg)  07/06/19 205 lb 9.6 oz (93.3 kg)      Studies/Labs Reviewed:   EKG:  EKG is not ordered today.   Recent Labs: 06/11/2019: ALT 69 07/05/2019: B Natriuretic Peptide 49.6; BUN 9; Creatinine, Ser 1.06; Hemoglobin 12.2; Magnesium 1.9; Platelets 286; Potassium 4.0; Sodium 140   Lipid Panel No results found for: CHOL, TRIG, HDL, CHOLHDL, VLDL, LDLCALC, LDLDIRECT  Additional studies/ records that were reviewed today include:   Echo 06/23/2019 IMPRESSIONS    1. The left ventricle has low normal systolic function, with an ejection fraction of 50-55%. The cavity size was normal. Left ventricular diastolic function could not be evaluated due to indeterminate diastolic function. No evidence of left ventricular  regional wall motion abnormalities.  2. The right ventricle has normal systolic function. The cavity was mildly enlarged. There is no increase in right ventricular wall thickness. Right ventricular systolic pressure is normal with an estimated pressure of 26.2 mmHg.  3. Left atrial size was mildly dilated.  4. S/P  95mm Sorin mechanical MVR which appears to be functioning normally. There is no perivalvular MR. The mean MVG is .  5. The aorta is normal in size and structure.    ASSESSMENT:    1. 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 Chronic diastolic heart failure (HCC)   3. Avascular necrosis of bone of hip, unspecified laterality (HCC)   4. PAF (paroxysmal atrial fibrillation) (HCC)      PLAN:  In order of problems listed above:  1. Status post mitral valve replacement with mechanical valve: Continue aspirin and Coumadin.  Patient likely will have upcoming hip surgery for avascular necrosis, once he determines a date of the surgery, will need to coordinate with Coumadin clinic at least 5 days before the surgery in order to set up Lovenox bridging.  He will double check with CT surgery to see if he may proceed with avascular necrosis surgery now that he is at least 2 months after his valve procedure.  2. Chronic diastolic heart failure: Despite his recent weight gain, he appears to be euvolemic on physical exam.  I recommended continue current diuretic therapy  3. Avascular necrosis: Evaluated by surgeon at [facility].  He was deemed not a candidate for a free vascularized fibular graft.  Future surgery likely will be required  4. Postop atrial fibrillation: No recurrence.  Despite the  fact he had transient advanced heart block 2 days after his valve surgery, he has not had any recurrent bradycardia after recent initiation of metoprolol succinate 25 mg twice daily.  He is on Coumadin therapy for mechanical heart valve.    Medication Adjustments/Labs and Tests Ordered: Current medicines are reviewed at length with the patient today.  Concerns regarding medicines are outlined above.  Medication changes, Labs and Tests ordered today are listed in the Patient Instructions below. Patient Instructions  Medication Instructions:  YOU MAKE TAKE AN EXTRA 20MG  OF LASIX AS  NEEDED FOR VOLUME OVERLOAD If you need a refill on your cardiac medications before your next appointment, please call your pharmacy.   Lab work: None  If you have labs (blood work) drawn today and your tests are completely normal, you will receive your results only by:  MyChart Message (if you have MyChart) OR  A paper copy in the mail If you have any lab test that is abnormal or we need to change your treatment, we will call you to review the results.  Testing/Procedures: None   Follow-Up: At [facility], you and your health needs are our priority.  As part of our continuing mission to provide you with exceptional heart care, we have created designated Provider Care Teams.  These Care Teams include your primary Cardiologist (physician) and Advanced Practice Providers (APPs -  Physician Assistants and Nurse Practitioners) who all work together to provide you with the care you need, when you need it.  FOLLOW UP WITH DR [facility] AS SCHEDULED   Any Other Special Instructions Will Be Listed Below (If Applicable).      Darren Haynes, Darren Haynes  07/24/2019 10:56 PM    [facility] [address] [place], [place], [place]  [place] Phone: [phone]; Fax: [phone]

## 2019-07-24 ENCOUNTER — Encounter: Payer: Self-pay | Admitting: Physician Assistant

## 2019-07-24 ENCOUNTER — Encounter (HOSPITAL_COMMUNITY)
Admission: RE | Admit: 2019-07-24 | Discharge: 2019-07-24 | Disposition: A | Discharge status: Home / Self Care | Payer: BC Managed Care – PPO | Source: Ambulatory Visit | Attending: Cardiovascular Disease | Admitting: Cardiovascular Disease

## 2019-07-24 ENCOUNTER — Other Ambulatory Visit: Payer: Self-pay

## 2019-07-24 DIAGNOSIS — Z954 Presence of other heart-valve replacement: Secondary | ICD-10-CM

## 2019-07-27 ENCOUNTER — Other Ambulatory Visit: Payer: Self-pay

## 2019-07-27 ENCOUNTER — Encounter (HOSPITAL_COMMUNITY)
Admission: RE | Admit: 2019-07-27 | Discharge: 2019-07-27 | Disposition: A | Discharge status: Home / Self Care | Payer: BC Managed Care – PPO | Source: Ambulatory Visit | Attending: Cardiovascular Disease | Admitting: Cardiovascular Disease

## 2019-07-27 DIAGNOSIS — Z954 Presence of other heart-valve replacement: Secondary | ICD-10-CM

## 2019-07-28 NOTE — Progress Notes (Signed)
Cardiac Individual Treatment Plan  Patient Details  Name: Darren Haynes MRN: [id number] Date of Birth: 03-23-1986 Referring Provider:     CARDIAC REHAB PHASE II ORIENTATION from 06/18/2019 in [place]  Referring Provider  Dr. Gwenlyn Found      Initial Encounter Date:    CARDIAC REHAB PHASE II ORIENTATION from 06/18/2019 in [facility]  Date  06/18/19      Visit Diagnosis: S/P minimally invasive mitral valve replacement with mechanical valve  Patient's Home Medications on Admission:  Current Outpatient Medications:  .  acetaminophen (TYLENOL) 500 MG tablet, Take 2 tablets (1,000 mg total) by mouth every 6 (six) hours. (Patient not taking: Reported on 07/23/2019), Disp: 30 tablet, Rfl: 0 .  albuterol (PROVENTIL) (2.5 MG/3ML) 0.083% nebulizer solution, USE 1 NEBULE QID PRN, Disp: , Rfl:  .  albuterol (VENTOLIN HFA) 108 (90 Base) MCG/ACT inhaler, INHALE 1-2 PUFFS QID PRN, Disp: , Rfl:  .  ALPRAZolam (NIRAVAM) 1 MG dissolvable tablet, Take 0.5 mg by mouth 3 (three) times daily as needed for anxiety. , Disp: , Rfl:  .  aspirin EC 81 MG tablet, Take 81 mg by mouth daily., Disp: , Rfl:  .  benztropine (COGENTIN) 0.5 MG tablet, Take 0.5 mg by mouth 2 (two) times daily., Disp: , Rfl:  .  Buprenorphine HCl (BELBUCA) 750 MCG FILM, Place 750 mcg inside cheek 2 (two) times daily. , Disp: , Rfl:  .  busPIRone (BUSPAR) 7.5 MG tablet, Take 7.5 mg by mouth 3 (three) times daily., Disp: , Rfl:  .  celecoxib (CELEBREX) 200 MG capsule, Take by mouth., Disp: , Rfl:  .  Cholecalciferol (VITAMIN D-1000 MAX ST) 25 MCG (1000 UT) tablet, Take by mouth., Disp: , Rfl:  .  clonazePAM (KLONOPIN) 0.5 MG tablet, Take 0.5 mg by mouth at bedtime., Disp: , Rfl:  .  diclofenac sodium (VOLTAREN) 1 % GEL, Apply topically., Disp: , Rfl:  .  famotidine (PEPCID) 40 MG tablet, Take 40 mg by mouth daily., Disp: , Rfl:  .  fluticasone-salmeterol (ADVAIR HFA) 45-21 MCG/ACT  inhaler, Inhale 2 puffs into the lungs 2 (two) times daily., Disp: , Rfl:  .  furosemide (LASIX) 40 MG tablet, Take 1 tablet (40 mg total) by mouth daily. You may take an extra 19m as needed for volume overload., Disp: 45 tablet, Rfl: 3 .  gabapentin (NEURONTIN) 300 MG capsule, Take 600 mg by mouth 3 (three) times daily. , Disp: , Rfl:  .  leflunomide (ARAVA) 20 MG tablet, Take 20 mg by mouth daily., Disp: , Rfl:  .  MELATONIN PO, Take 1 tablet by mouth at bedtime as needed (sleep)., Disp: , Rfl:  .  metoprolol succinate (TOPROL-XL) 50 MG 24 hr tablet, Take 25 mg by mouth 2 (two) times daily., Disp: , Rfl:  .  nicotine polacrilex (NICORETTE) 4 MG gum, , Disp: , Rfl:  .  nortriptyline (PAMELOR) 25 MG capsule, Take 25 mg by mouth at bedtime. , Disp: , Rfl:  .  Oxycodone HCl 10 MG TABS, , Disp: , Rfl:  .  oxyCODONE-acetaminophen (PERCOCET) 7.5-325 MG tablet, Take 1 tablet by mouth every 4 (four) hours as needed for severe pain., Disp: , Rfl:  .  penicillin v potassium (VEETID) 250 MG tablet, Take 250 mg by mouth 2 (two) times a day., Disp: , Rfl:  .  potassium chloride (K-DUR) 10 MEQ tablet, Take 1 tablet (10 mEq total) by mouth daily., Disp: 7 tablet,  Rfl: 0 .  sertraline (ZOLOFT) 100 MG tablet, Take 100 mg by mouth at bedtime. , Disp: , Rfl:  .  tiZANidine (ZANAFLEX) 4 MG tablet, Take 4 mg by mouth 3 (three) times daily., Disp: , Rfl:  .  Vitamin D, Ergocalciferol, (DRISDOL) 1.25 MG (50000 UT) CAPS capsule, Take 50,000 Units by mouth every Tuesday. , Disp: , Rfl:  .  warfarin (COUMADIN) 2.5 MG tablet, Take 1 to 1.5 tablets by mouth daily as directed., Disp: 135 tablet, Rfl: 0  Current Facility-Administered Medications:  .  metoprolol tartrate (LOPRESSOR) tablet 25 mg, 25 mg, Oral, BID, Rexene Alberts, MD  Past Medical History: Past Medical History:  Diagnosis Date  . Arthritis   . Avascular necrosis ([place])   . CHF (congestive heart failure) ([place])   . Chronic back pain   . Haynes   .  Dyspnea   . GERD (gastroesophageal reflux disease)   . Heart murmur   . Mitral valve stenosis   . Panic attacks   . Pneumonia    hx 4 time  . Pulmonary hypertension ([place])   . Rheumatic fever   . S/P minimally invasive mitral valve replacement with mechanical valve 05/14/2019   29 mm Sorin [employer]omed[employer] bileaflet mechanical valve via right mini thoracotomy approach    Tobacco Use: Social History   Tobacco Use  Smoking Status Never Smoker  Smokeless Tobacco Current User  . Types: Snuff    Labs: Recent Review Flowsheet Data    Labs for ITP Cardiac and Pulmonary Rehab Latest Ref Rng & Units 05/11/2019 05/14/2019 05/14/2019 05/14/2019 05/14/2019   Hemoglobin A1c 4.8 - 5.6 % 4.0(L) - - - -   PHART 7.350 - 7.450 7.399 7.346(L) 7.224(L) 7.312(L) 7.298(L)   PCO2ART 32.0 - 48.0 mmHg 41.5 48.8(H) 56.3(H) 46.6 49.5(H)   HCO3 20.0 - 28.0 mmol/L 25.1 26.7 23.3 24.0 24.6   TCO2 22 - 32 mmol/L - 28 25 26 26    ACIDBASEDEF 0.0 - 2.0 mmol/L - - 4.0(H) 3.0(H) 2.0   O2SAT % 98.3 100.0 92.0 97.0 99.0      Capillary Blood Glucose: Lab Results  Component Value Date   GLUCAP 120 (H) 05/16/2019   GLUCAP 123 (H) 05/16/2019   GLUCAP 100 (H) 05/16/2019   GLUCAP 88 05/15/2019   GLUCAP 102 (H) 05/15/2019     Exercise Target Goals: Exercise Program Goal: Individual exercise prescription set using results from initial 6 min walk test and THRR while considering  patient's activity barriers and safety.   Exercise Prescription Goal: Starting with aerobic activity 30 plus minutes a day, 3 days per week for initial exercise prescription. Provide home exercise prescription and guidelines that participant acknowledges understanding prior to discharge.  Activity Barriers & Risk Stratification: Activity Barriers & Cardiac Risk Stratification - 06/18/19 1150      Activity Barriers & Cardiac Risk Stratification   Activity Barriers  Arthritis;Joint Problems;Deconditioning;Balance Concerns;History of Falls;Assistive  Device    Cardiac Risk Stratification  High       6 Minute Walk: 6 Minute Walk    Row Name 06/18/19 0800 07/29/19 1601       6 Minute Walk   Phase  Initial  Discharge    Distance  1117 feet  1580 feet    Distance % Change  -  41.45 %    Distance Feet Change  -  463 ft    Walk Time  6 minutes  6 minutes    # of Rest Breaks  0  0    MPH  2.12  2.9    METS  2.61  5.7    RPE  13  12    Perceived Dyspnea   -  0    VO2 Peak  17.08  20.09    Symptoms  Yes (comment)  Yes (comment)    Comments  bilateral knee pain (7/10)  Lower body pain 9/10    Resting HR  107 bpm  97 bpm    Resting BP  104/70  98/62    Resting Oxygen Saturation   99 %  -    Exercise Oxygen Saturation  during 6 min walk  99 %  -    Max Ex. HR  118 bpm  114 bpm    Max Ex. BP  112/80  132/70    2 Minute Post BP  102/78  120/80       Oxygen Initial Assessment:   Oxygen Re-Evaluation:   Oxygen Discharge (Final Oxygen Re-Evaluation):   Initial Exercise Prescription: Initial Exercise Prescription - 06/18/19 1100      Date of Initial Exercise RX and Referring Provider   Date  06/18/19    Referring Provider  Dr. Gwenlyn Found      NuStep   Level  2    SPM  80    Minutes  15    METs  2      Arm Ergometer   Level  2.5    Minutes  15      Prescription Details   Frequency (times per week)  3    Duration  Progress to 30 minutes of continuous aerobic without Haynes/symptoms of physical distress      Intensity   THRR 40-80% of Max Heartrate  76-151    Ratings of Perceived Exertion  11-13    Perceived Dyspnea  0-4      Progression   Progression  Continue to progress workloads to maintain intensity without Haynes/symptoms of physical distress.      Resistance Training   Training Prescription  Yes    Weight  5 lbs    Reps  10-15       Perform Capillary Blood Glucose checks as needed.  Exercise Prescription Changes:  Exercise Prescription Changes    Row Name 06/22/19 1000 06/29/19 0856 07/29/19 0845          Response to Exercise   Blood Pressure (Admit)  116/84  112/84  98/62     Blood Pressure (Exercise)  118/72  118/82  132/72     Blood Pressure (Exit)  112/68  108/72  120/80     Heart Rate (Admit)  107 bpm  115 bpm  97 bpm     Heart Rate (Exercise)  114 bpm  124 bpm  130 bpm     Heart Rate (Exit)  102 bpm  115 bpm  92 bpm     Rating of Perceived Exertion (Exercise)  13  12  13      Perceived Dyspnea (Exercise)  0  0  0     Symptoms  none  none  none     Comments  Patient tolerated 1st session of exercise well with some joint pain, which is chronic for him.  -  -     Duration  Progress to 30 minutes of  aerobic without Haynes/symptoms of physical distress  Progress to 30 minutes of  aerobic without Haynes/symptoms of physical distress  Progress to 30 minutes of  aerobic without Haynes/symptoms of  physical distress     Intensity  THRR unchanged  THRR unchanged  THRR unchanged       Progression   Progression  Continue to progress workloads to maintain intensity without Haynes/symptoms of physical distress.  Continue to progress workloads to maintain intensity without Haynes/symptoms of physical distress.  Continue to progress workloads to maintain intensity without Haynes/symptoms of physical distress.     Average METs  1.7  2.1  4.8       Resistance Training   Training Prescription  Yes  Yes  No     Weight  5 lbs  5 lbs  -     Reps  10-15  10-15  -     Time  10 Minutes  10 Minutes  -       Interval Training   Interval Training  No  No  No       NuStep   Level  2  2  4      SPM  80  85  85     Minutes  15  15  15      METs  1.7  2.1  3.9       Arm Ergometer   Level  2.5  3  8      Minutes  15  15  15        Home Exercise Plan   Plans to continue exercise at  -  Home (comment) Walking  Home (comment)     Frequency  -  Add 4 additional days to program exercise sessions.  Add 4 additional days to program exercise sessions.     Initial Home Exercises Provided  -  06/29/19  06/29/19         Exercise Comments:  Exercise Comments    Row Name 06/22/19 [phone] 06/29/19 0942 07/27/19 1140       Exercise Comments  Patient tolerated 1st session of exercise of exercise fairly well with some joint pain, which is chronic for him. Progress workloads as tolerated.  Reviewed home exercise guidelines and goals with patient.  Reviewed METs and goals with Pt. Pt understands goals.        Exercise Goals and Review:  Exercise Goals    Row Name 06/18/19 1202             Exercise Goals   Increase Physical Activity  Yes       Intervention  Provide advice, education, support and counseling about physical activity/exercise needs.;Develop an individualized exercise prescription for aerobic and resistive training based on initial evaluation findings, risk stratification, comorbidities and participant's personal goals.       Expected Outcomes  Long Term: Exercising regularly at least 3-5 days a week.;Long Term: Add in home exercise to make exercise part of routine and to increase amount of physical activity.;Short Term: Attend rehab on a regular basis to increase amount of physical activity.       Increase Strength and Stamina  Yes       Intervention  Provide advice, education, support and counseling about physical activity/exercise needs.;Develop an individualized exercise prescription for aerobic and resistive training based on initial evaluation findings, risk stratification, comorbidities and participant's personal goals.       Expected Outcomes  Short Term: Increase workloads from initial exercise prescription for resistance, speed, and METs.;Short Term: Perform resistance training exercises routinely during rehab and add in resistance training at home;Long Term: Improve cardiorespiratory fitness, muscular endurance and strength as measured by increased METs and functional  capacity (6MWT)       Able to understand and use rate of perceived exertion (RPE) scale  Yes       Intervention  Provide  education and explanation on how to use RPE scale       Expected Outcomes  Short Term: Able to use RPE daily in rehab to express subjective intensity level;Long Term:  Able to use RPE to guide intensity level when exercising independently       Able to understand and use Dyspnea scale  Yes       Intervention  Provide education and explanation on how to use Dyspnea scale       Expected Outcomes  Short Term: Able to use Dyspnea scale daily in rehab to express subjective sense of shortness of breath during exertion;Long Term: Able to use Dyspnea scale to guide intensity level when exercising independently       Knowledge and understanding of Target Heart Rate Range (THRR)  Yes       Expected Outcomes  Short Term: Able to state/look up THRR;Short Term: Able to use daily as guideline for intensity in rehab;Long Term: Able to use THRR to govern intensity when exercising independently       Able to check pulse independently  Yes       Intervention  Provide education and demonstration on how to check pulse in carotid and radial arteries.;Review the importance of being able to check your own pulse for safety during independent exercise       Expected Outcomes  Short Term: Able to explain why pulse checking is important during independent exercise;Long Term: Able to check pulse independently and accurately       Understanding of Exercise Prescription  Yes       Intervention  Provide education, explanation, and written materials on patient's individual exercise prescription       Expected Outcomes  Short Term: Able to explain program exercise prescription;Long Term: Able to explain home exercise prescription to exercise independently          Exercise Goals Re-Evaluation : Exercise Goals Re-Evaluation    Row Name 06/22/19 0943 06/29/19 0942 07/27/19 1137         Exercise Goal Re-Evaluation   Exercise Goals Review  Increase Physical Activity;Able to understand and use rate of perceived exertion (RPE)  scale  Increase Physical Activity;Able to understand and use rate of perceived exertion (RPE) scale;Understanding of Exercise Prescription;Knowledge and understanding of Target Heart Rate Range (THRR);Able to check pulse independently;Increase Strength and Stamina  Increase Physical Activity;Increase Strength and Stamina;Able to understand and use rate of perceived exertion (RPE) scale;Knowledge and understanding of Target Heart Rate Range (THRR);Able to check pulse independently;Understanding of Exercise Prescription     Comments  Patient able to understand and use RPE scale appropriately.  Reviewed home exercise guidelines with patient including THRR, RPE scale, and endpoints for exercise. Pt knows how to check his pulse independently. Pt's goal is to lower resting and exercise heart rate. Pt is walking 45 minutes daily.  Reviewed METs and goals with Pt. Pt average MET level is 2.5. Pt is progressing well and increases workloads gradually. Pt has been trying to exercise at home when Darren Haynes can. Pt has chronic pain that makes it difficult to exercise at times. Pt was enocuraged to exercise 30 minutes a day by walking in additoin to CR program.     Expected Outcomes  Increase workloads as tolerated to help improve cardiorespiratory fitness.  Patient will increase  workloads as tolerated to help improve cardiorespiratory fitness and achieve lower heart rate response at rest and with exercise.  Will continue to monitor and progress Pt as tolerated.         Discharge Exercise Prescription (Final Exercise Prescription Changes): Exercise Prescription Changes - 07/29/19 0845      Response to Exercise   Blood Pressure (Admit)  98/62    Blood Pressure (Exercise)  132/72    Blood Pressure (Exit)  120/80    Heart Rate (Admit)  97 bpm    Heart Rate (Exercise)  130 bpm    Heart Rate (Exit)  92 bpm    Rating of Perceived Exertion (Exercise)  13    Perceived Dyspnea (Exercise)  0    Symptoms  none    Duration   Progress to 30 minutes of  aerobic without Haynes/symptoms of physical distress    Intensity  THRR unchanged      Progression   Progression  Continue to progress workloads to maintain intensity without Haynes/symptoms of physical distress.    Average METs  4.8      Resistance Training   Training Prescription  No      Interval Training   Interval Training  No      NuStep   Level  4    SPM  85    Minutes  15    METs  3.9      Arm Ergometer   Level  8    Minutes  15      Home Exercise Plan   Plans to continue exercise at  Home (comment)    Frequency  Add 4 additional days to program exercise sessions.    Initial Home Exercises Provided  06/29/19       Nutrition:  Target Goals: Understanding of nutrition guidelines, daily intake of sodium <1577m, cholesterol <2013m calories 30% from fat and 7% or less from saturated fats, daily to have 5 or more servings of fruits and vegetables.  Biometrics: Pre Biometrics - 06/18/19 1203      Pre Biometrics   Height  6' 1.5" (1.867 m)    Weight  204 lb 5.9 oz (92.7 kg)    Waist Circumference  38 inches    Hip Circumference  43.5 inches    Waist to Hip Ratio  0.87 %    BMI (Calculated)  26.59    Triceps Skinfold  17 mm    % Body Fat  24.9 %    Grip Strength  53 kg    Flexibility  15 in    Single Leg Stand  4.5 seconds      Post Biometrics - 07/29/19 1602       Post  Biometrics   Height  6' 1.5" (1.867 m)    Weight  213 lb 6.5 oz (96.8 kg)    Waist Circumference  38.5 inches    Hip Circumference  43.5 inches    Waist to Hip Ratio  0.89 %    BMI (Calculated)  27.77    Triceps Skinfold  --   Could not get accurate measurement due to excessive fluid.   % Body Fat  25.6 %    Grip Strength  54 kg    Flexibility  16 in    Single Leg Stand  30 seconds       Nutrition Therapy Plan and Nutrition Goals:   Nutrition Assessments:   Nutrition Goals Re-Evaluation:   Nutrition Goals Discharge (Final Nutrition Goals  Re-Evaluation):   Psychosocial: Target Goals: Acknowledge presence or absence of significant Haynes and/or stress, maximize coping skills, provide positive support system. Participant is able to verbalize types and ability to use techniques and skills needed for reducing stress and Haynes.  Initial Review & Psychosocial Screening: Initial Psych Review & Screening - 06/18/19 1110      Initial Review   Current issues with  Current Haynes;Current Anxiety/Panic      Family Dynamics   Good Support System?  Yes   Pt's wife, mother, and grandmother are sources of support for patient.   Comments  Darren Haynes reports Haynes related to his current medical issures with his heart, arthiritis, and avascular necrosis.  Darren Haynes has a history of panic attacks.      Barriers   Psychosocial barriers to participate in program  The patient should benefit from training in stress management and relaxation.;Psychosocial barriers identified (see note)      Screening Interventions   Interventions  Encouraged to exercise;To provide support and resources with identified psychosocial needs;Provide feedback about the scores to participant    Expected Outcomes  Short Term goal: Utilizing psychosocial counselor, staff and physician to assist with identification of specific Stressors or current issues interfering with healing process. Setting desired goal for each stressor or current issue identified.;Long Term Goal: Stressors or current issues are controlled or eliminated.;Short Term goal: Identification and review with participant of any Quality of Life or Haynes concerns found by scoring the questionnaire.;Long Term goal: The participant improves quality of Life and PHQ9 Scores as seen by post scores and/or verbalization of changes       Quality of Life Scores: Quality of Life - 07/29/19 1605      Quality of Life   Select  Quality of Life      Quality of Life Scores   Health/Function Post  16.47 %     Socioeconomic Post  16.29 %    Psych/Spiritual Post  23.14 %    Family Post  28.8 %    GLOBAL Post  19.62 %      Scores of 19 and below usually indicate a poorer quality of life in these areas.  A difference of  2-3 points is a clinically meaningful difference.  A difference of 2-3 points in the total score of the Quality of Life Index has been associated with significant improvement in overall quality of life, self-image, physical symptoms, and general health in studies assessing change in quality of life.  PHQ-9: Recent Review Flowsheet Data    Haynes screen [facility] 2/9 06/22/2019 06/18/2019   Decreased Interest - 1   Down, Depressed, Hopeless - 1   PHQ - 2 Score - 2   Altered sleeping - 3   Tired, decreased energy - 3   Change in appetite - 0   Feeling bad or failure about yourself  - 3   Trouble concentrating - 1   Moving slowly or fidgety/restless - 3   Suicidal thoughts - 0   PHQ-9 Score - 15   Difficult doing work/chores Somewhat difficult Somewhat difficult     Interpretation of Total Score  Total Score Haynes Severity:  1-4 = Minimal Haynes, 5-9 = Mild Haynes, 10-14 = Moderate Haynes, 15-19 = Moderately severe Haynes, 20-27 = Severe Haynes   Psychosocial Evaluation and Intervention: Psychosocial Evaluation - 06/18/19 1111      Psychosocial Evaluation & Interventions   Interventions  Stress management education;Relaxation education;Encouraged to exercise with the program and follow exercise prescription  Comments  Saw reports Haynes related to his current medical issures with his heart, arthiritis, and avascular necrosis.  Darren Haynes has a history of panic attacks. Darren Haynes sees a psychiatrist once a month.  Darren Haynes finds it somewhat helpful.  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  Darren Haynes also enjoys watching his daughter play.    Expected Outcomes  Darren Haynes will report ability to manage his Haynes and anxiety.     Continue Psychosocial Services   Follow up required by counselor       Psychosocial Re-Evaluation: Psychosocial Re-Evaluation    [place] Name 06/29/19 0853 07/28/19 1157           Psychosocial Re-Evaluation   Current issues with  Current Haynes  -      Comments  Darren Haynes and feels his current medications and treatments are working for him  Darren Haynes and feels his current medications and treatments are working for him      Expected Outcomes  Will continue to monitor and provide emotional support as needed  Will continue to monitor and provide emotional support as needed      Interventions  Encouraged to attend Cardiac Rehabilitation for the exercise  Encouraged to attend Cardiac Rehabilitation for the exercise      Continue Psychosocial Services   Follow up required by staff  No Follow up required         Psychosocial Discharge (Final Psychosocial Re-Evaluation): Psychosocial Re-Evaluation - 07/28/19 1157      Psychosocial Re-Evaluation   Comments  Darren Haynes has not voiced any increased Haynes and feels his current medications and treatments are working for him    Expected Outcomes  Will continue to monitor and provide emotional support as needed    Interventions  Encouraged to attend Cardiac Rehabilitation for the exercise    Continue Psychosocial Services   No Follow up required       Vocational Rehabilitation: Provide vocational rehab assistance to qualifying candidates.   Vocational Rehab Evaluation & Intervention: Vocational Rehab - 06/18/19 1113      Initial Vocational Rehab Evaluation & Intervention   Assessment shows need for Vocational Rehabilitation  No       Education: Education Goals: Education classes will be provided on a weekly basis, covering required topics. Participant will state understanding/return demonstration of topics presented.  Learning Barriers/Preferences: Learning  Barriers/Preferences - 06/18/19 1113      Learning Barriers/Preferences   Learning Barriers  None    Learning Preferences  Skilled Demonstration       Education Topics: Hypertension, Hypertension Reduction -Define heart disease and high blood pressure. Discus how high blood pressure affects the body and ways to reduce high blood pressure.   Exercise and Your Heart -Discuss why it is important to exercise, the FITT principles of exercise, normal and abnormal responses to exercise, and how to exercise safely.   Angina -Discuss definition of angina, causes of angina, treatment of angina, and how to decrease risk of having angina.   Cardiac Medications -Review what the following cardiac medications are used for, how they affect the body, and side effects that may occur when taking the medications.  Medications include Aspirin, Beta blockers, calcium channel blockers, ACE Inhibitors, angiotensin receptor blockers, diuretics, digoxin, and antihyperlipidemics.   Congestive Heart Failure -Discuss the definition of CHF, how to live with CHF, the Haynes and symptoms of CHF, and how keep track of weight and  sodium intake.   Heart Disease and Intimacy -Discus the effect sexual activity has on the heart, how changes occur during intimacy as we age, and safety during sexual activity.   Smoking Cessation / COPD -Discuss different methods to quit smoking, the health benefits of quitting smoking, and the definition of COPD.   Nutrition I: Fats -Discuss the types of cholesterol, what cholesterol does to the heart, and how cholesterol levels can be controlled.   Nutrition II: Labels -Discuss the different components of food labels and how to read food label   Heart Parts/Heart Disease and PAD -Discuss the anatomy of the heart, the pathway of blood circulation through the heart, and these are affected by heart disease.   Stress I: Haynes and Symptoms -Discuss the causes of stress, how  stress may lead to anxiety and Haynes, and ways to limit stress.   Stress II: Relaxation -Discuss different types of relaxation techniques to limit stress.   Warning Haynes of Stroke / TIA -Discuss definition of a stroke, what the Haynes and symptoms are of a stroke, and how to identify when someone is having stroke.   Knowledge Questionnaire Score: Knowledge Questionnaire Score - 07/29/19 1605      Knowledge Questionnaire Score   Post Score  26/28       Core Components/Risk Factors/Patient Goals at Admission: Personal Goals and Risk Factors at Admission - 07/01/19 1638      Core Components/Risk Factors/Patient Goals on Admission    Weight Management  Yes       Core Components/Risk Factors/Patient Goals Review:  Goals and Risk Factor Review    Row Name 07/01/19 1639 07/28/19 1158           Core Components/Risk Factors/Patient Goals Review   Personal Goals Review  Weight Management/Obesity;Heart Failure;Stress;Tobacco Cessation  Weight Management/Obesity;Heart Failure;Stress;Tobacco Cessation      Review  Rana is off to a good start to exercise. Darren Haynes have been stable. Will continue to encourage smoking cessation and stress management.  Darren Haynes has done well with exercise considering his chronic pain due to his arthritis. Darren Haynes heart rate has been better controlled since Darren Haynes has been on a beta blocker      Expected Outcomes  Patient will continue to participate in phase 2 cardiac rehab for exercise and risk factor management.  Patient will continue to participate in phase 2 cardiac rehab for exercise and risk factor management. Darren Haynes completes exercise on Friday.         Core Components/Risk Factors/Patient Goals at Discharge (Final Review):  Goals and Risk Factor Review - 07/28/19 1158      Core Components/Risk Factors/Patient Goals Review   Personal Goals Review  Weight Management/Obesity;Heart Failure;Stress;Tobacco Cessation    Review  Darren Haynes  has done well with exercise considering his chronic pain due to his arthritis. Darren Haynes heart rate has been better controlled since Darren Haynes has been on a beta blocker    Expected Outcomes  Patient will continue to participate in phase 2 cardiac rehab for exercise and risk factor management. Darren Haynes completes exercise on Friday.       ITP Comments: ITP Comments    Row Name 06/18/19 1050 07/02/19 1133 07/28/19 1156       ITP Comments  Dr. Fransico Him, Medical Director  30 Day ITP Review. Greyson is off to a good start to exercise.  30 Day ITP Review. Patient is with good participation and attendence in phase 2 cardiac rehab.  Comments: See ITP comments. Darren Haynes completes cardiac rehab on 07/31/19 .[name], RN,BSN 07/30/2019 2:37 PM

## 2019-07-29 ENCOUNTER — Telehealth: Payer: Self-pay | Admitting: Cardiovascular Disease

## 2019-07-29 ENCOUNTER — Encounter (HOSPITAL_COMMUNITY)
Admission: RE | Admit: 2019-07-29 | Discharge: 2019-07-29 | Disposition: A | Discharge status: Home / Self Care | Payer: BC Managed Care – PPO | Source: Ambulatory Visit | Attending: Cardiovascular Disease | Admitting: Cardiovascular Disease

## 2019-07-29 ENCOUNTER — Other Ambulatory Visit: Payer: Self-pay

## 2019-07-29 VITALS — Ht 73.5 in | Wt 213.4 lb

## 2019-07-29 DIAGNOSIS — Z954 Presence of other heart-valve replacement: Secondary | ICD-10-CM | POA: Diagnosis not present

## 2019-07-29 NOTE — Telephone Encounter (Signed)
Returned the call to [place] at cardiac rehab. She stated that the patient's weight was up 1kg. Blood pressure today was 120/80. The patient was able to exercise fine today. Please see epic note for details.   Verdis Frederickson will fax his flow sheet to the office since he graduates on Friday.   Call placed to the patient. He stated that he felt fine. He denied any shortness of breath but noticed that his ring was starting to fit tight. He will try taking an extra Furosemide today and will continue to monitor his weight. He has been advised to call the office back if the weight does not come off and he gets symptomatic.

## 2019-07-29 NOTE — Progress Notes (Signed)
Darren Haynes's weight is up 1 kg from Monday. Patient denies being short of breath today but reports that he could not put his ring on and that his arms feel tight. Upon assessment lung fields diminished right posterior base otherwise lung fields clear. Oxygen saturation 98% on room air. Heart rate 97 Sinus Rhythm. Oxygen saturation 98% on room air. No peripheral edema noted. Darren Haynes says he will take an additional 20 mg of Furosemide when he gets home as prescribed PRN . Spoke with Darren Haynes nurse at Dr Darren Haynes office about weight gain.Will fax exercise flow sheets to Dr. [name] office for review. Darren Haynes completed exercise without difficulty. Exit blood pressure 120/80. [name], RN,BSN 07/29/2019 10:17 AM

## 2019-07-29 NOTE — Telephone Encounter (Signed)
New message   Please call [name] at [facility] about this patient's weight gain. Please call to discuss.

## 2019-07-31 ENCOUNTER — Other Ambulatory Visit: Payer: Self-pay

## 2019-07-31 ENCOUNTER — Encounter (HOSPITAL_COMMUNITY)
Admission: RE | Admit: 2019-07-31 | Discharge: 2019-07-31 | Disposition: A | Discharge status: Home / Self Care | Payer: BC Managed Care – PPO | Source: Ambulatory Visit | Attending: Cardiovascular Disease | Admitting: Cardiovascular Disease

## 2019-07-31 DIAGNOSIS — Z954 Presence of other heart-valve replacement: Secondary | ICD-10-CM

## 2019-07-31 NOTE — Progress Notes (Signed)
Discharge Progress Report  Patient Details  Name: Darren Haynes MRN: [id number] Date of Birth: Dec 11, 1985 Referring Provider:     [place] from 06/18/2019 in [place]  Referring Provider  Dr. Gwenlyn Found       Number of Visits: 13  Reason for Discharge:  Patient reached a stable level of exercise. Patient independent in their exercise. Patient has met program and personal goals.  Smoking History:  Social History   Tobacco Use  Smoking Status Never Smoker  Smokeless Tobacco Current User  . Types: Snuff    Diagnosis:  S/P minimally invasive mitral valve replacement with mechanical valve  ADL UCSD:   Initial Exercise Prescription: Initial Exercise Prescription - 06/18/19 1100      Date of Initial Exercise RX and Referring Provider   Date  06/18/19    Referring Provider  Dr. Gwenlyn Found      NuStep   Level  2    SPM  80    Minutes  15    METs  2      Arm Ergometer   Level  2.5    Minutes  15      Prescription Details   Frequency (times per week)  3    Duration  Progress to 30 minutes of continuous aerobic without signs/symptoms of physical distress      Intensity   THRR 40-80% of Max Heartrate  76-151    Ratings of Perceived Exertion  11-13    Perceived Dyspnea  0-4      Progression   Progression  Continue to progress workloads to maintain intensity without signs/symptoms of physical distress.      Resistance Training   Training Prescription  Yes    Weight  5 lbs    Reps  10-15       Discharge Exercise Prescription (Final Exercise Prescription Changes): Exercise Prescription Changes - 07/31/19 1000      Response to Exercise   Blood Pressure (Admit)  118/76    Blood Pressure (Exercise)  122/64    Blood Pressure (Exit)  110/64    Heart Rate (Admit)  99 bpm    Heart Rate (Exercise)  115 bpm    Heart Rate (Exit)  99 bpm    Rating of Perceived Exertion (Exercise)  13    Perceived Dyspnea (Exercise)   0    Symptoms  none    Comments  Pt last day in CR program.     Duration  Progress to 30 minutes of  aerobic without signs/symptoms of physical distress    Intensity  THRR unchanged      Progression   Progression  Continue to progress workloads to maintain intensity without signs/symptoms of physical distress.    Average METs  3.4      Resistance Training   Training Prescription  Yes    Weight  7 lbs.     Reps  10-15    Time  10 Minutes      Interval Training   Interval Training  No      NuStep   Level  4    SPM  85    Minutes  15    METs  3.3      Arm Ergometer   Level  8    Minutes  15      Home Exercise Plan   Plans to continue exercise at  Home (comment)    Frequency  Add 4 additional  days to program exercise sessions.    Initial Home Exercises Provided  06/29/19       Functional Capacity: 6 Minute Walk    Row Name 06/18/19 0800 07/29/19 1601       6 Minute Walk   Phase  Initial  Discharge    Distance  1117 feet  1580 feet    Distance % Change  -  41.45 %    Distance Feet Change  -  463 ft    Walk Time  6 minutes  6 minutes    # of Rest Breaks  0  0    MPH  2.12  2.9    METS  2.61  5.7    RPE  13  12    Perceived Dyspnea   -  0    VO2 Peak  17.08  20.09    Symptoms  Yes (comment)  Yes (comment)    Comments  bilateral knee pain (7/10)  Lower body pain 9/10    Resting HR  107 bpm  97 bpm    Resting BP  104/70  98/62    Resting Oxygen Saturation   99 %  -    Exercise Oxygen Saturation  during 6 min walk  99 %  -    Max Ex. HR  118 bpm  114 bpm    Max Ex. BP  112/80  132/70    2 Minute Post BP  102/78  120/80       Psychological, QOL, Others - Outcomes: PHQ 2/9: Depression screen [facility] 2/9 07/31/2019 06/22/2019 06/18/2019  Decreased Interest 0 - 1  Down, Depressed, Hopeless 1 - 1  PHQ - 2 Score 1 - 2  Altered sleeping - - 3  Tired, decreased energy - - 3  Change in appetite - - 0  Feeling bad or failure about yourself  - - 3  Trouble concentrating  - - 1  Moving slowly or fidgety/restless - - 3  Suicidal thoughts - - 0  PHQ-9 Score - - 15  Difficult doing work/chores - Somewhat difficult Somewhat difficult    Quality of Life: Quality of Life - 07/29/19 1605      Quality of Life   Select  Quality of Life      Quality of Life Scores   Health/Function Post  16.47 %    Socioeconomic Post  16.29 %    Psych/Spiritual Post  23.14 %    Family Post  28.8 %    GLOBAL Post  19.62 %       Personal Goals: Goals established at orientation with interventions provided to work toward goal. Personal Goals and Risk Factors at Admission - 07/01/19 1638      Core Components/Risk Factors/Patient Goals on Admission    Weight Management  Yes        Personal Goals Discharge: Goals and Risk Factor Review    Row Name 07/01/19 1639 07/28/19 1158 07/31/19 0850         Core Components/Risk Factors/Patient Goals Review   Personal Goals Review  Weight Management/Obesity;Heart Failure;Stress;Tobacco Cessation  Weight Management/Obesity;Heart Failure;Stress;Tobacco Cessation  Weight Management/Obesity;Heart Failure;Stress;Tobacco Cessation     Review  Linell is off to a good start to exercise. Michaels vital signs have been stable. Will continue to encourage smoking cessation and stress management.  Shahil has done well with exercise considering his chronic pain due to his arthritis. Micheal's heart rate has been better controlled since he has been on a beta  blocker  Daunte has done well with exercise considering his chronic pain due to his arthritis. Micheal's heart rate has been better controlled since he has been on a beta blocker     Expected Outcomes  Patient will continue to participate in phase 2 cardiac rehab for exercise and risk factor management.  Patient will continue to participate in phase 2 cardiac rehab for exercise and risk factor management. Anothony completes exercise on Friday.  Elis plans to start physcial therapy after cardiac  rehab. He is not sure of the date.        Exercise Goals and Review: Exercise Goals    Row Name 06/18/19 1202             Exercise Goals   Increase Physical Activity  Yes       Intervention  Provide advice, education, support and counseling about physical activity/exercise needs.;Develop an individualized exercise prescription for aerobic and resistive training based on initial evaluation findings, risk stratification, comorbidities and participant's personal goals.       Expected Outcomes  Long Term: Exercising regularly at least 3-5 days a week.;Long Term: Add in home exercise to make exercise part of routine and to increase amount of physical activity.;Short Term: Attend rehab on a regular basis to increase amount of physical activity.       Increase Strength and Stamina  Yes       Intervention  Provide advice, education, support and counseling about physical activity/exercise needs.;Develop an individualized exercise prescription for aerobic and resistive training based on initial evaluation findings, risk stratification, comorbidities and participant's personal goals.       Expected Outcomes  Short Term: Increase workloads from initial exercise prescription for resistance, speed, and METs.;Short Term: Perform resistance training exercises routinely during rehab and add in resistance training at home;Long Term: Improve cardiorespiratory fitness, muscular endurance and strength as measured by increased METs and functional capacity (6MWT)       Able to understand and use rate of perceived exertion (RPE) scale  Yes       Intervention  Provide education and explanation on how to use RPE scale       Expected Outcomes  Short Term: Able to use RPE daily in rehab to express subjective intensity level;Long Term:  Able to use RPE to guide intensity level when exercising independently       Able to understand and use Dyspnea scale  Yes       Intervention  Provide education and explanation on how to  use Dyspnea scale       Expected Outcomes  Short Term: Able to use Dyspnea scale daily in rehab to express subjective sense of shortness of breath during exertion;Long Term: Able to use Dyspnea scale to guide intensity level when exercising independently       Knowledge and understanding of Target Heart Rate Range (THRR)  Yes       Expected Outcomes  Short Term: Able to state/look up THRR;Short Term: Able to use daily as guideline for intensity in rehab;Long Term: Able to use THRR to govern intensity when exercising independently       Able to check pulse independently  Yes       Intervention  Provide education and demonstration on how to check pulse in carotid and radial arteries.;Review the importance of being able to check your own pulse for safety during independent exercise       Expected Outcomes  Short Term: Able to explain why pulse checking  is important during independent exercise;Long Term: Able to check pulse independently and accurately       Understanding of Exercise Prescription  Yes       Intervention  Provide education, explanation, and written materials on patient's individual exercise prescription       Expected Outcomes  Short Term: Able to explain program exercise prescription;Long Term: Able to explain home exercise prescription to exercise independently          Exercise Goals Re-Evaluation: Exercise Goals Re-Evaluation    Row Name 06/22/19 0943 06/29/19 0942 07/27/19 1137 07/31/19 1044       Exercise Goal Re-Evaluation   Exercise Goals Review  Increase Physical Activity;Able to understand and use rate of perceived exertion (RPE) scale  Increase Physical Activity;Able to understand and use rate of perceived exertion (RPE) scale;Understanding of Exercise Prescription;Knowledge and understanding of Target Heart Rate Range (THRR);Able to check pulse independently;Increase Strength and Stamina  Increase Physical Activity;Increase Strength and Stamina;Able to understand and use  rate of perceived exertion (RPE) scale;Knowledge and understanding of Target Heart Rate Range (THRR);Able to check pulse independently;Understanding of Exercise Prescription  Increase Physical Activity;Understanding of Exercise Prescription;Increase Strength and Stamina;Knowledge and understanding of Target Heart Rate Range (THRR);Able to understand and use rate of perceived exertion (RPE) scale;Able to check pulse independently    Comments  Patient able to understand and use RPE scale appropriately.  Reviewed home exercise guidelines with patient including THRR, RPE scale, and endpoints for exercise. Pt knows how to check his pulse independently. Pt's goal is to lower resting and exercise heart rate. Pt is walking 45 minutes daily.  Reviewed METs and goals with Pt. Pt average MET level is 2.5. Pt is progressing well and increases workloads gradually. Pt has been trying to exercise at home when he can. Pt has chronic pain that makes it difficult to exercise at times. Pt was enocuraged to exercise 30 minutes a day by walking in additoin to CR program.  Pt graduated CR program today. Pt progressed well in the program and tolerated exercise Rx well. Pt ended with an average MET level of 4.5. Pt stated he would continue exercising at home by walking 30-45 minutes 5-7 days per week. Pt stated he would start PT soon for a surgery that he will be having. Encouraged Pt to adhere to the exercsiis PT gives him.    Expected Outcomes  Increase workloads as tolerated to help improve cardiorespiratory fitness.  Patient will increase workloads as tolerated to help improve cardiorespiratory fitness and achieve lower heart rate response at rest and with exercise.  Will continue to monitor and progress Pt as tolerated.  Pt will continue exercising at home.       Nutrition & Weight - Outcomes: Pre Biometrics - 06/18/19 1203      Pre Biometrics   Height  6' 1.5" (1.867 m)    Weight  92.7 kg    Waist Circumference  38 inches     Hip Circumference  43.5 inches    Waist to Hip Ratio  0.87 %    BMI (Calculated)  26.59    Triceps Skinfold  17 mm    % Body Fat  24.9 %    Grip Strength  53 kg    Flexibility  15 in    Single Leg Stand  4.5 seconds      Post Biometrics - 07/29/19 1602       Post  Biometrics   Height  6' 1.5" (1.867 m)  Weight  96.8 kg    Waist Circumference  38.5 inches    Hip Circumference  43.5 inches    Waist to Hip Ratio  0.89 %    BMI (Calculated)  27.77    Triceps Skinfold  --   Could not get accurate measurement due to excessive fluid.   % Body Fat  25.6 %    Grip Strength  54 kg    Flexibility  16 in    Single Leg Stand  30 seconds       Nutrition:   Nutrition Discharge:   Education Questionnaire Score: Knowledge Questionnaire Score - 07/29/19 1605      Knowledge Questionnaire Score   Post Score  26/28       Goals reviewed with patient; copy given to patient.Pt graduated from cardiac rehab program on 07/31/19 with completion of 13 exercise sessions in Phase II. Pt maintained good attendance and progressed nicely during his participation in rehab as evidenced by increased MET level. Farrell increased his distance on his post exercise walk test.  Medication list reconciled. Repeat  PHQ score- 1 .  Pt has made significant lifestyle changes and should be commended for his success. Pt feels he has achieved his goals during cardiac rehab.  Discussed continued smoking cessation with Legrand Como as he continues to use snuff. Agnes says he has started using nicotine gum to decrease the amount of  snuff he uses in a day.[name], RN,BSN 08/03/2019 3:35 PM

## 2019-08-03 ENCOUNTER — Encounter: Payer: Self-pay | Admitting: Thoracic Surgery (Cardiothoracic Vascular Surgery)

## 2019-08-03 ENCOUNTER — Other Ambulatory Visit: Payer: Self-pay

## 2019-08-03 ENCOUNTER — Ambulatory Visit (INDEPENDENT_AMBULATORY_CARE_PROVIDER_SITE_OTHER): Payer: Self-pay | Admitting: Thoracic Surgery (Cardiothoracic Vascular Surgery)

## 2019-08-03 VITALS — BP 117/80 | HR 92 | Resp 18 | Ht 74.0 in | Wt 209.0 lb

## 2019-08-03 DIAGNOSIS — Z954 Presence of other heart-valve replacement: Secondary | ICD-10-CM

## 2019-08-03 NOTE — Progress Notes (Signed)
[address]       [place], [address]             [phone]     CARDIOTHORACIC SURGERY OFFICE NOTE  Referring Provider is Lorretta Harp, MD PCP is [facility]   HPI:   Patient is a 33 year old male with rheumatic mitral stenosis, chronic diastolic congestive heart failure, pulmonary hypertension, rheumatoid arthritis, and possible multiple sclerosis who returns the office todayfor follow-up status post minimally invasive mitral valve replacement using a mechanical prosthetic valve on May 14, 2019.  He was last seen here in our office on July 06, 2019 at which time he was doing well.  Routine follow-up echocardiogram performed June 23, 2019 look good with normal left and right ventricular systolic function.  Mechanical valve in the mitral position was functioning normally with no paravalvular leak.  Mean transvalvular gradient was estimated 7 mmHg.  Patient has been taking Coumadin as prescribed and follow closely in the Coumadin clinic.  His most recent INR was 3.5.  He returns her office today and reports that he is doing very well.  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  He no longer has any pain in his chest related to his surgery.  He reports that he gets short of breath only with significant physical exertion and overall his breathing and exercise tolerance are dramatically better than they were prior to surgery.  He is not having any palpitations or other symptoms to suggest a recurrence of atrial fibrillation.   Current Outpatient Medications  Medication Sig Dispense Refill  . acetaminophen (TYLENOL) 500 MG tablet Take 2 tablets (1,000 mg total) by mouth every 6 (six) hours. 30 tablet 0  . albuterol (PROVENTIL) (2.5 MG/3ML) 0.083% nebulizer solution USE 1 NEBULE QID PRN    . albuterol (VENTOLIN HFA) 108 (90 Base) MCG/ACT inhaler INHALE 1-2 PUFFS QID PRN    . ALPRAZolam (NIRAVAM) 1 MG  dissolvable tablet Take 0.5 mg by mouth 3 (three) times daily as needed for anxiety.     [name] Kitchen aspirin EC 81 MG tablet Take 81 mg by mouth daily.    . benztropine (COGENTIN) 0.5 MG tablet Take 0.5 mg by mouth 2 (two) times daily.    . Buprenorphine HCl (BELBUCA) 750 MCG FILM Place 750 mcg inside cheek 2 (two) times daily.     . busPIRone (BUSPAR) 7.5 MG tablet Take 15 mg by mouth 3 (three) times daily.     . celecoxib (CELEBREX) 200 MG capsule Take by mouth.    . Cholecalciferol (VITAMIN D-1000 MAX ST) 25 MCG (1000 UT) tablet Take by mouth.    . clonazePAM (KLONOPIN) 0.5 MG tablet Take 0.5 mg by mouth at bedtime.    . diclofenac sodium (VOLTAREN) 1 % GEL Apply topically.    . famotidine (PEPCID) 40 MG tablet Take 40 mg by mouth daily.    . fluticasone-salmeterol (ADVAIR HFA) 45-21 MCG/ACT inhaler Inhale 2 puffs into the lungs 2 (two) times daily.    . furosemide (LASIX) 40 MG tablet Take 1 tablet (40 mg total) by mouth daily. You may take an extra 20mg  as needed for volume overload. 45 tablet 3  . gabapentin (NEURONTIN) 300 MG capsule Take 600 mg by mouth 3 (three) times daily.     [name] Kitchen leflunomide (ARAVA) 20 MG tablet Take 20 mg by mouth daily.    [name] Kitchen MELATONIN PO Take 1 tablet by mouth  at bedtime as needed (sleep).    . metoprolol succinate (TOPROL-XL) 50 MG 24 hr tablet Take 25 mg by mouth 2 (two) times daily.    . nicotine polacrilex (NICORETTE) 4 MG gum     . nortriptyline (PAMELOR) 25 MG capsule Take 25 mg by mouth at bedtime.     . Oxycodone HCl 10 MG TABS     . penicillin v potassium (VEETID) 250 MG tablet Take 250 mg by mouth 2 (two) times a day.    . potassium chloride (K-DUR) 10 MEQ tablet Take 1 tablet (10 mEq total) by mouth daily. 7 tablet 0  . sertraline (ZOLOFT) 100 MG tablet Take 200 mg by mouth at bedtime.     [name] Kitchen tiZANidine (ZANAFLEX) 4 MG tablet Take 4 mg by mouth 3 (three) times daily.    . Vitamin D, Ergocalciferol, (DRISDOL) 1.25 MG (50000 UT) CAPS capsule Take 50,000 Units by  mouth every Tuesday.     . warfarin (COUMADIN) 2.5 MG tablet Take 1 to 1.5 tablets by mouth daily as directed. 135 tablet 0   Current Facility-Administered Medications  Medication Dose Route Frequency Provider Last Rate Last Dose  . metoprolol tartrate (LOPRESSOR) tablet 25 mg  25 mg Oral BID Purcell Nails, MD          Physical Exam:   BP 117/80 (BP Location: Left Arm, Patient Position: Sitting, Cuff Size: Normal)   Pulse 92   Resp 18   Ht 6\' 2"  (1.88 m)   Wt 209 lb (94.8 kg)   SpO2 97% Comment: RA  BMI 26.83 kg/m   General:  Well-appearing  Chest:   Clear to auscultation  CV:   Regular rate and rhythm with mechanical heart valve sounds  Incisions:  Completely healed  Abdomen:  Soft nontender  Extremities:  Warm and well-perfused  Diagnostic Tests:  n/a   Impression:  Patient is doing very well approximately 58-month status post minimally invasive mitral valve replacement using mechanical prosthetic valve for rheumatic mitral stenosis and mitral regurgitation.    Plan:  We have not recommended any changes to the patient's current medications.  I have encouraged the patient to continue to increase his physical activity without any particular limitations.  I think he could proceed with elective hip replacement at any time as long as his anticoagulation is managed appropriately.  The patient has been reminded regarding the importance of dental hygiene and the lifelong need for antibiotic prophylaxis for all dental cleanings and other related invasive procedures.  Patient will return to our office next July, approximately 1 year following his surgery.  He will call and return sooner should specific problems or questions arise.    August. Salvatore Decent, MD 08/03/2019 11:14 AM

## 2019-08-03 NOTE — Patient Instructions (Signed)

## 2019-08-04 LAB — POCT INR

## 2019-08-05 ENCOUNTER — Ambulatory Visit (INDEPENDENT_AMBULATORY_CARE_PROVIDER_SITE_OTHER): Payer: BC Managed Care – PPO

## 2019-08-05 DIAGNOSIS — I050 Rheumatic mitral stenosis: Secondary | ICD-10-CM | POA: Diagnosis not present

## 2019-08-05 DIAGNOSIS — Z954 Presence of other heart-valve replacement: Secondary | ICD-10-CM

## 2019-08-05 LAB — POCT INR: INR: 4.6 — AB (ref 2.0–3.0)

## 2019-08-10 ENCOUNTER — Ambulatory Visit: Payer: BC Managed Care – PPO | Admitting: Thoracic Surgery (Cardiothoracic Vascular Surgery)

## 2019-08-12 ENCOUNTER — Ambulatory Visit (INDEPENDENT_AMBULATORY_CARE_PROVIDER_SITE_OTHER): Payer: BC Managed Care – PPO | Admitting: Cardiovascular Disease

## 2019-08-12 DIAGNOSIS — I050 Rheumatic mitral stenosis: Secondary | ICD-10-CM | POA: Diagnosis not present

## 2019-08-12 DIAGNOSIS — Z954 Presence of other heart-valve replacement: Secondary | ICD-10-CM

## 2019-08-12 LAB — POCT INR: INR: 3 (ref 2.0–3.0)

## 2019-08-13 DIAGNOSIS — H53123 Transient visual loss, bilateral: Secondary | ICD-10-CM | POA: Insufficient documentation

## 2019-08-13 DIAGNOSIS — H532 Diplopia: Secondary | ICD-10-CM | POA: Insufficient documentation

## 2019-08-13 DIAGNOSIS — H04123 Dry eye syndrome of bilateral lacrimal glands: Secondary | ICD-10-CM | POA: Insufficient documentation

## 2019-08-16 ENCOUNTER — Other Ambulatory Visit: Payer: Self-pay

## 2019-08-16 ENCOUNTER — Telehealth: Payer: BC Managed Care – PPO | Admitting: Family

## 2019-08-16 ENCOUNTER — Emergency Department (HOSPITAL_COMMUNITY)
Admission: EM | Admit: 2019-08-16 | Discharge: 2019-08-16 | Disposition: A | Discharge status: Home / Self Care | Payer: BC Managed Care – PPO | Attending: Emergency Medicine | Admitting: Emergency Medicine

## 2019-08-16 ENCOUNTER — Emergency Department (HOSPITAL_COMMUNITY): Payer: BC Managed Care – PPO

## 2019-08-16 ENCOUNTER — Encounter (HOSPITAL_COMMUNITY): Payer: Self-pay

## 2019-08-16 DIAGNOSIS — M7918 Myalgia, other site: Secondary | ICD-10-CM | POA: Insufficient documentation

## 2019-08-16 DIAGNOSIS — J069 Acute upper respiratory infection, unspecified: Secondary | ICD-10-CM

## 2019-08-16 DIAGNOSIS — R509 Fever, unspecified: Secondary | ICD-10-CM | POA: Diagnosis not present

## 2019-08-16 DIAGNOSIS — Z20828 Contact with and (suspected) exposure to other viral communicable diseases: Secondary | ICD-10-CM | POA: Insufficient documentation

## 2019-08-16 DIAGNOSIS — Z7901 Long term (current) use of anticoagulants: Secondary | ICD-10-CM | POA: Diagnosis not present

## 2019-08-16 DIAGNOSIS — R0602 Shortness of breath: Secondary | ICD-10-CM

## 2019-08-16 DIAGNOSIS — I509 Heart failure, unspecified: Secondary | ICD-10-CM | POA: Insufficient documentation

## 2019-08-16 DIAGNOSIS — Z7982 Long term (current) use of aspirin: Secondary | ICD-10-CM | POA: Diagnosis not present

## 2019-08-16 DIAGNOSIS — Z20822 Contact with and (suspected) exposure to covid-19: Secondary | ICD-10-CM

## 2019-08-16 DIAGNOSIS — Z79899 Other long term (current) drug therapy: Secondary | ICD-10-CM | POA: Insufficient documentation

## 2019-08-16 LAB — CBC WITH DIFFERENTIAL/PLATELET
Abs Immature Granulocytes: 0.03 10*3/uL (ref 0.00–0.07)
Basophils Absolute: 0 10*3/uL (ref 0.0–0.1)
Basophils Relative: 0 %
Eosinophils Absolute: 0.3 10*3/uL (ref 0.0–0.5)
Eosinophils Relative: 2 %
HCT: 37.2 % — ABNORMAL LOW (ref 39.0–52.0)
Hemoglobin: 12 g/dL — ABNORMAL LOW (ref 13.0–17.0)
Immature Granulocytes: 0 %
Lymphocytes Relative: 12 %
Lymphs Abs: 1.3 10*3/uL (ref 0.7–4.0)
MCH: 26.8 pg (ref 26.0–34.0)
MCHC: 32.3 g/dL (ref 30.0–36.0)
MCV: 83.2 fL (ref 80.0–100.0)
Monocytes Absolute: 1 10*3/uL (ref 0.1–1.0)
Monocytes Relative: 9 %
Neutro Abs: 8.7 10*3/uL — ABNORMAL HIGH (ref 1.7–7.7)
Neutrophils Relative %: 77 %
Platelets: 167 10*3/uL (ref 150–400)
RBC: 4.47 MIL/uL (ref 4.22–5.81)
RDW: 17.2 % — ABNORMAL HIGH (ref 11.5–15.5)
WBC: 11.3 10*3/uL — ABNORMAL HIGH (ref 4.0–10.5)
nRBC: 0 % (ref 0.0–0.2)

## 2019-08-16 LAB — PROTIME-INR
INR: 2.9 — ABNORMAL HIGH (ref 0.8–1.2)
Prothrombin Time: 29.7 seconds — ABNORMAL HIGH (ref 11.4–15.2)

## 2019-08-16 LAB — COMPREHENSIVE METABOLIC PANEL
ALT: 20 U/L (ref 0–44)
AST: 20 U/L (ref 15–41)
Albumin: 3.7 g/dL (ref 3.5–5.0)
Alkaline Phosphatase: 54 U/L (ref 38–126)
Anion gap: 7 (ref 5–15)
BUN: 12 mg/dL (ref 6–20)
CO2: 25 mmol/L (ref 22–32)
Calcium: 8.6 mg/dL — ABNORMAL LOW (ref 8.9–10.3)
Chloride: 108 mmol/L (ref 98–111)
Creatinine, Ser: 1.03 mg/dL (ref 0.61–1.24)
GFR calc Af Amer: >60 mL/min (ref >60)
GFR calc non Af Amer: >60 mL/min (ref >60)
Glucose, Bld: 102 mg/dL — ABNORMAL HIGH (ref 70–99)
Potassium: 3.9 mmol/L (ref 3.5–5.1)
Sodium: 140 mmol/L (ref 135–145)
Total Bilirubin: 0.7 mg/dL (ref 0.3–1.2)
Total Protein: 6.4 g/dL — ABNORMAL LOW (ref 6.5–8.1)

## 2019-08-16 LAB — URINALYSIS, ROUTINE W REFLEX MICROSCOPIC
Bilirubin Urine: NEGATIVE
Glucose, UA: NEGATIVE mg/dL
Hgb urine dipstick: NEGATIVE
Ketones, ur: NEGATIVE mg/dL
Leukocytes,Ua: NEGATIVE
Nitrite: NEGATIVE
Protein, ur: NEGATIVE mg/dL
Specific Gravity, Urine: 1.017 (ref 1.005–1.030)
pH: 7 (ref 5.0–8.0)

## 2019-08-16 LAB — GROUP A STREP BY PCR: Group A Strep by PCR: NOT DETECTED

## 2019-08-16 LAB — BRAIN NATRIURETIC PEPTIDE: B Natriuretic Peptide: 113.4 pg/mL — ABNORMAL HIGH (ref 0.0–100.0)

## 2019-08-16 LAB — SARS CORONAVIRUS 2 BY RT PCR (HOSPITAL ORDER, PERFORMED IN ~~LOC~~ HOSPITAL LAB): SARS Coronavirus 2: NEGATIVE

## 2019-08-16 LAB — TROPONIN I (HIGH SENSITIVITY)
Troponin I (High Sensitivity): 3 ng/L (ref <18)
Troponin I (High Sensitivity): 3 ng/L (ref <18)

## 2019-08-16 MED ORDER — PROMETHAZINE-DM 6.25-15 MG/5ML PO SYRP: 5.0000 mL | ORAL_SOLUTION | Freq: Four times a day (QID) | ORAL | 0 refills | Status: DC | PRN

## 2019-08-16 NOTE — Discharge Instructions (Signed)
Your work-up today did not show evidence of pneumonia or the novel coronavirus.  Your BNP was slightly more elevated than last time but greatly improved from the summer in regards to your heart failure.  Your oxygen saturations are normal and we do not feel he needs admission but we do think you need to rest and stay hydrated.  Please follow-up with your primary doctor and if any symptoms change or worsen, please return to the nearest emergency department.

## 2019-08-16 NOTE — Progress Notes (Signed)
E-Visit for Corona Virus Screening   Your current symptoms could be consistent with the coronavirus.  Many health care providers can now test patients at their office but not all are.  [place] has multiple testing sites. For information on our COVID testing locations and hours go to [email or web address]  Please quarantine yourself while awaiting your test results. They are open tomorrow 8am-330pm, [address]   We are enrolling you in our MyChart Home Montioring for COVID19 . Daily you will receive a questionnaire within the MyChart website. Our COVID 19 response team willl be monitoriing your responses daily.    COVID-19 is a respiratory illness with symptoms that are similar to the flu. Symptoms are typically mild to moderate, but there have been cases of severe illness and death due to the virus. The following symptoms may appear 2-14 days after exposure: . Fever . Cough . Shortness of breath or difficulty breathing . Chills . Repeated shaking with chills . Muscle pain . Headache . Sore throat . New loss of taste or smell . Fatigue . Congestion or runny nose . Nausea or vomiting . Diarrhea  It is vitally important that if you feel that you have an infection such as this virus or any other virus that you stay home and away from places where you may spread it to others.  You should self-quarantine for 14 days if you have symptoms that could potentially be coronavirus or have been in close contact a with a person diagnosed with COVID-19 within the last 2 weeks. You should avoid contact with people age 40 and older.   You should wear a mask or cloth face covering over your nose and mouth if you must be around other people or animals, including pets (even at home). Try to stay at least 6 feet away from other people. This will protect the people around you.  You can use medication such as A prescription cough medication called Phenergan DM 6.25 mg/15 mg.  You make take one teaspoon / 5 ml every 4-6 hours as needed for cough  You may also take acetaminophen (Tylenol) as needed for fever.   Reduce your risk of any infection by using the same precautions used for avoiding the common cold or flu:  [name] Kitchen Wash your hands often with soap and warm water for at least 20 seconds.  If soap and water are not readily available, use an alcohol-based hand sanitizer with at least 60% alcohol.  . If coughing or sneezing, cover your mouth and nose by coughing or sneezing into the elbow areas of your shirt or coat, into a tissue or into your sleeve (not your hands). . Avoid shaking hands with others and consider head nods or verbal greetings only. . Avoid touching your eyes, nose, or mouth with unwashed hands.  . Avoid close contact with people who are sick. . Avoid places or events with large numbers of people in one location, like concerts or sporting events. . Carefully consider travel plans you have or are making. . If you are planning any travel outside or inside the [place], visit the CDC's Travelers' Health webpage for the latest health notices. . If you have some symptoms but not all symptoms, continue to monitor at home and seek medical attention if your symptoms worsen. . If you are having a medical emergency, call 911.  HOME CARE . Only take medications as instructed by your medical team. . Drink plenty of fluids and get plenty of rest. .  A steam or ultrasonic humidifier can help if you have congestion.   GET HELP RIGHT AWAY IF YOU HAVE EMERGENCY WARNING SIGNS** FOR COVID-19. If you or someone is showing any of these signs seek emergency medical care immediately. Call 911 or proceed to your closest emergency facility if: . You develop worsening high fever. . Trouble breathing . Bluish lips or face . Persistent pain or pressure in the chest . New confusion . Inability to wake or stay awake . You cough up blood. . Your symptoms become more severe  **This  list is not all possible symptoms. Contact your medical provider for any symptoms that are sever or concerning to you.   MAKE SURE YOU   Understand these instructions.  Will watch your condition.  Will get help right away if you are not doing well or get worse.  Your e-visit answers were reviewed by a board certified advanced clinical practitioner to complete your personal care plan.  Depending on the condition, your plan could have included both over the counter or prescription medications.  If there is a problem please reply once you have received a response from your provider.  Your safety is important to [place].  If you have drug allergies check your prescription carefully.    You can use MyChart to ask questions about today's visit, request a non-urgent call back, or ask for a work or school excuse for 24 hours related to this e-Visit. If it has been greater than 24 hours you will need to follow up with your provider, or enter a new e-Visit to address those concerns. You will get an e-mail in the next two days asking about your experience.  I hope that your e-visit has been valuable and will speed your recovery. Thank you for using e-visits.   Greater than 5 minutes, yet less than 10 minutes of time have been spent researching, coordinating, and implementing care for this patient today.  Thank you for the details you included in the comment boxes. Those details are very helpful in determining the best course of treatment for you and help [place] to provide the best care.

## 2019-08-16 NOTE — ED Triage Notes (Signed)
Pt reports weakness, muscle pain, sore throat, and shortness of breath that started today. No known COVID exposure. Pt reports temperature of 100.4 and took tylenol at 1300 currently afebrile at 98. Pt has history of mitral valve replacement in July.

## 2019-08-16 NOTE — ED Provider Notes (Signed)
[facility]-EMERGENCY DEPT Provider Note   CSN: [id number] Arrival date & time: 08/16/19  1743     History   Chief Complaint Chief Complaint  Patient presents with  . Shortness of Breath    HPI Darren BlazerMichael L Bogle is a 33 y.o. male.     The history is provided by the patient and medical records. No language interpreter was used.  Fever Max temp prior to arrival:  100.5 Temp source:  Oral Severity:  Moderate Onset quality:  Gradual Duration:  1 day Timing:  Constant Progression:  Waxing and waning Chronicity:  New Relieved by:  Acetaminophen Worsened by:  Nothing Ineffective treatments:  None tried Associated symptoms: chills, congestion, cough, dysuria, myalgias, rhinorrhea and sore throat   Associated symptoms: no chest pain, no confusion, no diarrhea, no headaches, no nausea, no rash and no vomiting     Past Medical History:  Diagnosis Date  . Arthritis   . Avascular necrosis (HCC)   . CHF (congestive heart failure) (HCC)   . Chronic back pain   . Depression   . Dyspnea   . GERD (gastroesophageal reflux disease)   . Heart murmur   . Mitral valve stenosis   . Panic attacks   . Pneumonia    hx 4 time  . Pulmonary hypertension (HCC)   . Rheumatic fever   . 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 Heart rate fast 07/07/2019  . 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 PSVT (paroxysmal supraventricular tachycardia) (HCC) 03/12/2019  . Nonsustained ventricular tachycardia (HCC) 03/12/2019  . Bilateral lower extremity edema 03/12/2019  . Dyspnea on exertion 03/12/2019  . Mitral stenosis 12/09/2018    Past Surgical History:  Procedure Laterality Date  . BALLOON VALVULOPLASTY  12/24/2018   [facility]  . MITRAL VALVE REPLACEMENT Right 05/14/2019   Procedure: MINIMALLY INVASIVE MITRAL VALVE (MV) REPLACEMENT USING CARBOMEDICS OPTIFORM Size 29mm;  Surgeon: Purcell Nailswen, Clarence H, MD;  [facility];  Service: Open Heart Surgery;  Laterality: Right;  . RIGHT/LEFT HEART CATH AND CORONARY ANGIOGRAPHY N/A 12/11/2018   Procedure: RIGHT/LEFT HEART CATH AND CORONARY ANGIOGRAPHY;  Surgeon: Runell GessBerry, Jonathan J, MD;  Location: [facility] INVASIVE CV LAB;  Service: Cardiovascular;  Laterality: N/A;  . TEE WITHOUT CARDIOVERSION N/A 12/11/2018   Procedure: TRANSESOPHAGEAL ECHOCARDIOGRAM (TEE);  Surgeon: Thurmon Fairroitoru, Mihai, MD;  [facility];  Service: Cardiovascular;  Laterality: N/A;  . TEE WITHOUT CARDIOVERSION N/A 03/27/2019   Procedure: TRANSESOPHAGEAL ECHOCARDIOGRAM (TEE);  Surgeon: Elease HashimotoNahser, Deloris PingPhilip J, MD;  [facility];  Service: Cardiovascular;  Laterality: N/A;  . TEE WITHOUT CARDIOVERSION N/A 05/14/2019   Procedure: TRANSESOPHAGEAL ECHOCARDIOGRAM (TEE);  Surgeon: Purcell Nailswen, Clarence H, MD;  [facility];  Service: Open Heart Surgery;  Laterality: N/A;  . TYMPANOSTOMY TUBE PLACEMENT          Home Medications    Prior to Admission medications   Medication Sig Start Date End Date Taking? Authorizing Provider  acetaminophen (TYLENOL) 500 MG tablet Take 2 tablets (1,000 mg total) by mouth every 6 (six) hours. 05/19/19   Sharlene Doryonte, Tessa N, PA-C  albuterol (PROVENTIL) (2.5 MG/3ML) 0.083% nebulizer solution USE 1 NEBULE QID PRN 07/02/19   [provider]  albuterol (VENTOLIN HFA) 108 (90 Base) MCG/ACT inhaler INHALE 1-2 PUFFS  QID PRN 07/08/19   [provider]  ALPRAZolam (NIRAVAM) 1 MG dissolvable tablet Take 0.5 mg by mouth 3 (three) times daily as needed for anxiety.     [provider]  aspirin EC 81 MG tablet Take 81 mg by mouth daily.    [provider]  benztropine (COGENTIN) 0.5 MG tablet Take 0.5 mg by mouth 2 (two) times daily.    [provider]  Buprenorphine HCl (BELBUCA) 750 MCG FILM Place 750 mcg inside cheek 2 (two) times daily.     [provider]  busPIRone (BUSPAR) 7.5 MG tablet Take 15 mg by mouth 3 (three) times daily.     [provider]  celecoxib (CELEBREX) 200 MG capsule Take by mouth. 06/30/19   [provider]  Cholecalciferol (VITAMIN D-1000 MAX ST) 25 MCG (1000 UT) tablet Take by mouth.    [provider]  clonazePAM (KLONOPIN) 0.5 MG tablet Take 0.5 mg by mouth at bedtime.    [provider]  diclofenac sodium (VOLTAREN) 1 % GEL Apply topically. 06/19/19 06/18/20  [provider]  famotidine (PEPCID) 40 MG tablet Take 40 mg by mouth daily.    [provider]  fluticasone-salmeterol (ADVAIR HFA) [id number] MCG/ACT inhaler Inhale 2 puffs into the lungs 2 (two) times daily.    [provider]  furosemide (LASIX) 40 MG tablet Take 1 tablet (40 mg total) by mouth daily. You may take an extra  as needed for volume overload. 07/23/19   Azalee Course, PA  gabapentin (NEURONTIN) 300 MG capsule Take 600 mg by mouth 3 (three) times daily.     [provider]  leflunomide (ARAVA) 20 MG tablet Take 20 mg by mouth daily.    [provider]  MELATONIN PO Take 1 tablet by mouth at bedtime as needed (sleep).    [provider]  metoprolol succinate (TOPROL-XL) 50 MG 24 hr tablet Take 25 mg by mouth 2 (two) times daily. 07/03/19   [provider]  nicotine polacrilex (NICORETTE) 4 MG gum  07/06/19   [provider]  nortriptyline (PAMELOR) 25 MG capsule Take 25 mg by mouth at bedtime.  10/24/18   [provider]  Oxycodone HCl 10 MG TABS  07/08/19   [provider]  penicillin v potassium (VEETID) 250 MG tablet Take 250 mg by mouth 2 (two) times a day.    [provider]  potassium chloride (K-DUR) 10 MEQ tablet Take 1 tablet (10 mEq total) by mouth daily. 06/15/19   Barrett, Erin R, PA-C   promethazine-dextromethorphan (PROMETHAZINE-DM) 6.25-15 MG/5ML syrup Take 5 mLs by mouth 4 (four) times daily as needed. 08/16/19   Worthy Rancher B, FNP  sertraline (ZOLOFT) 100 MG tablet Take 200 mg by mouth at bedtime.     [provider]  tiZANidine (ZANAFLEX) 4 MG tablet Take 4 mg by mouth 3 (three) times daily. 04/22/19   [provider]  Vitamin D, Ergocalciferol, (DRISDOL) 1.25 MG (50000 UT) CAPS capsule Take 50,000 Units by mouth every Tuesday.     [provider]  warfarin (COUMADIN) 2.5 MG tablet Take 1 to 1.5 tablets by mouth daily as directed. 06/24/19   Runell Gess, MD    Family History Family History  Problem Relation Age of Onset  . Heart disease Mother   . Depression Mother   . Hypertension Father   . Hyperlipidemia Father     Social History Social History   Tobacco Use  .  Smoking status: Never Smoker  . Smokeless tobacco: Current User    Types: Snuff  Substance Use Topics  . Alcohol use: No  . Drug use: No     Allergies   Patient has no known allergies.   Review of Systems Review of Systems  Constitutional: Positive for chills, fatigue and fever. Negative for diaphoresis.  HENT: Positive for congestion, rhinorrhea and sore throat.   Eyes: Negative for visual disturbance.  Respiratory: Positive for cough and shortness of breath. Negative for chest tightness, wheezing and stridor.   Cardiovascular: Negative for chest pain, palpitations and leg swelling.  Gastrointestinal: Negative for abdominal pain, constipation, diarrhea, nausea and vomiting.  Genitourinary: Positive for dysuria. Negative for flank pain.  Musculoskeletal: Positive for myalgias. Negative for back pain, neck pain and neck stiffness.  Skin: Negative for rash and wound.  Neurological: Negative for dizziness, light-headedness and headaches.  Psychiatric/Behavioral: Negative for agitation and confusion.  All other systems reviewed and are  negative.    Physical Exam Updated Vital Signs BP 126/73   Pulse 85   Temp 98 F (36.7 C) (Oral)   Resp 18   SpO2 96%   Physical Exam Vitals signs and nursing note reviewed.  Constitutional:      General: He is not in acute distress.    Appearance: He is well-developed and normal weight. He is not ill-appearing, toxic-appearing or diaphoretic.  HENT:     Head: Normocephalic and atraumatic.     Mouth/Throat:     Mouth: Mucous membranes are moist.     Pharynx: Oropharynx is clear. No pharyngeal swelling or oropharyngeal exudate.  Eyes:     Extraocular Movements: Extraocular movements intact.     Conjunctiva/sclera: Conjunctivae normal.     Pupils: Pupils are equal, round, and reactive to light.  Neck:     Musculoskeletal: Neck supple.  Cardiovascular:     Rate and Rhythm: Normal rate and regular rhythm.     Heart sounds: Murmur present.  Pulmonary:     Effort: Pulmonary effort is normal. No respiratory distress.     Breath sounds: Normal breath sounds. No decreased breath sounds, wheezing, rhonchi or rales.  Chest:     Chest wall: No mass or tenderness.  Abdominal:     Palpations: Abdomen is soft.     Tenderness: There is no abdominal tenderness.  Musculoskeletal: Normal range of motion.     Right lower leg: He exhibits no tenderness. No edema.     Left lower leg: He exhibits no tenderness. No edema.  Skin:    General: Skin is warm and dry.     Capillary Refill: Capillary refill takes less than 2 seconds.     Findings: No rash.  Neurological:     General: No focal deficit present.     Mental Status: He is alert and oriented to person, place, and time.  Psychiatric:        Mood and Affect: Mood normal.      ED Treatments / Results  Labs (all labs ordered are listed, but only abnormal results are displayed) Labs Reviewed  PROTIME-INR - Abnormal; Notable for the following components:      Result Value   Prothrombin Time 29.7 (*)    INR 2.9 (*)    All other  components within normal limits  CBC WITH DIFFERENTIAL/PLATELET - Abnormal; Notable for the following components:   WBC 11.3 (*)    Hemoglobin 12.0 (*)    HCT 37.2 (*)    RDW  17.2 (*)    Neutro Abs 8.7 (*)    All other components within normal limits  COMPREHENSIVE METABOLIC PANEL - Abnormal; Notable for the following components:   Glucose, Bld 102 (*)    Calcium 8.6 (*)    Total Protein 6.4 (*)    All other components within normal limits  BRAIN NATRIURETIC PEPTIDE - Abnormal; Notable for the following components:   B Natriuretic Peptide 113.4 (*)    All other components within normal limits  SARS CORONAVIRUS 2 (HOSPITAL ORDER, PERFORMED IN [facility])  GROUP A STREP BY PCR  URINE CULTURE  URINALYSIS, ROUTINE W REFLEX MICROSCOPIC  TROPONIN I (HIGH SENSITIVITY)  TROPONIN I (HIGH SENSITIVITY)    EKG EKG Interpretation  Date/Time:  Sunday August 16 2019 18:09:37 EDT Ventricular Rate:  92 PR Interval:    QRS Duration: 84 QT Interval:  368 QTC Calculation: 456 R Axis:   56 Text Interpretation:  Sinus rhythm Prolonged PR interval Probable left atrial enlargement When compared to prior, no significant changes seen.  No STEMI Confirmed by Theda Belfastegeler, Chris ([id number]) on 08/16/2019 6:28:51 PM   Radiology Dg Chest Portable 1 View  Result Date: 08/16/2019 CLINICAL DATA:  Cough, shortness of breath, fever EXAM: PORTABLE CHEST 1 VIEW COMPARISON:  07/05/2019 FINDINGS: Cardiomegaly status post mitral valve replacement. Unchanged elevation of the right hemidiaphragm without acute airspace opacity. The visualized skeletal structures are unremarkable. IMPRESSION: Cardiomegaly without acute abnormality of the lungs in AP portable projection. Unchanged elevation of the right hemidiaphragm. Electronically Signed   By: Lauralyn PrimesAlex  Bibbey M.D.   On: 08/16/2019 19:22    Procedures Procedures (including critical care time)  Medications Ordered in ED Medications - No data to  display   Initial Impression / Assessment and Plan / ED Course  I have reviewed the triage vital signs and the nursing notes.  Pertinent labs & imaging results that were available during my care of the patient were reviewed by me and considered in my medical decision making (see chart for details).        Darren BlazerMichael L Haynes is a 33 y.o. male with a past medical history significant for prior strep throat, rheumatic fever leading to mitral valve stenosis with subsequent mechanical mitral valve replacements, GERD, pulmonary hypertension, CHF, prior pneumonia, and depression who presents with fevers, chills, myalgias, malaise, shortness of breath, nonproductive cough, and dysuria.  Patient reports that his symptoms began yesterday with the fatigue, myalgias, and some URI symptoms with sore throat.  He says that his sore throat feels like when he had strep in the past.  He is concerned because today he has had more shortness of breath and a fever was 100.4 at home.  Patient took Tylenol and came.  His oral temperature is afebrile on arrival but he has had the antipyretic before arrival.  He denies specific chest pain but does report diffuse myalgias and shortness of breath.  He says his legs are now more edematous than his baseline he does not think he is fluid overloaded.  His oxygen saturation is normal on room air and he is not tachypneic or tachycardic.  On exam, his lungs are clear and I do not hear rales.  He does have a mechanical clicking murmur.  Abdomen and chest nontender.  Back nontender.  No CVA tenderness.  Patient resting comfortably.  Had a shared decision-making conversation with patient we agreed to get screening labs, chest x-ray, and both COVID testing and strep testing.  He also  reports he does not dysuria, will get urinalysis and urine culture.   Patient will be ambulated in the room to check pulse oximetry.  If work-up is reassuring and we did not see evidence of worsened heart  failure, pneumonia, or other abnormality, he would likely be stable for discharge home.  9:33 PM Work-up returned overall reassuring.  Troponin negative x2.  COVID test negative.  No pneumonia.  Urine and other labs overall reassuring and improved from prior.  BNP slightly elevated but greatly improved from the summer.  Patient is feeling better and oxygen saturations remain normal.  Suspect viral URI is likely source of symptoms but not COVID-19.  Patient instructed to follow-up with his PCP and observe return precautions.  He agrees with plan of care and was discharged in good condition with normal oxygen saturation.    Final Clinical Impressions(s) / ED Diagnoses   Final diagnoses:  Upper respiratory tract infection, unspecified type  Fever, unspecified fever cause  Shortness of breath    ED Discharge Orders    None      Clinical Impression: 1. Upper respiratory tract infection, unspecified type   2. Fever, unspecified fever cause   3. Shortness of breath     Disposition: Discharge  Condition: Good  I have discussed the results, Dx and Tx plan with the pt(& family if present). He/she/they expressed understanding and agree(s) with the plan. Discharge instructions discussed at great length. Strict return precautions discussed and pt &/or family have verbalized understanding of the instructions. No further questions at time of discharge.    New Prescriptions   No medications on file    Follow Up: Center, [facility] [address] [place] [id number] [place] DEPT [place] [id number] [place]       , Gwenyth Allegra, MD 08/16/19 2133

## 2019-08-17 LAB — URINE CULTURE: Culture: NO GROWTH

## 2019-08-19 LAB — POCT INR: INR: 3.1 — AB (ref 2.0–3.0)

## 2019-08-20 ENCOUNTER — Ambulatory Visit (INDEPENDENT_AMBULATORY_CARE_PROVIDER_SITE_OTHER): Payer: BC Managed Care – PPO | Admitting: Cardiology

## 2019-08-20 DIAGNOSIS — Z954 Presence of other heart-valve replacement: Secondary | ICD-10-CM

## 2019-08-20 DIAGNOSIS — I050 Rheumatic mitral stenosis: Secondary | ICD-10-CM | POA: Diagnosis not present

## 2019-08-21 DIAGNOSIS — I099 Rheumatic heart disease, unspecified: Secondary | ICD-10-CM | POA: Insufficient documentation

## 2019-08-21 DIAGNOSIS — G8929 Other chronic pain: Secondary | ICD-10-CM | POA: Insufficient documentation

## 2019-08-21 DIAGNOSIS — M1990 Unspecified osteoarthritis, unspecified site: Secondary | ICD-10-CM | POA: Insufficient documentation

## 2019-08-21 DIAGNOSIS — F419 Anxiety disorder, unspecified: Secondary | ICD-10-CM | POA: Insufficient documentation

## 2019-08-21 DIAGNOSIS — I5032 Chronic diastolic (congestive) heart failure: Secondary | ICD-10-CM | POA: Insufficient documentation

## 2019-08-21 DIAGNOSIS — F32A Depression, unspecified: Secondary | ICD-10-CM | POA: Insufficient documentation

## 2019-08-24 ENCOUNTER — Other Ambulatory Visit: Payer: Self-pay | Admitting: Cardiovascular Disease

## 2019-08-26 ENCOUNTER — Other Ambulatory Visit: Payer: Self-pay | Admitting: Cardiovascular Disease

## 2019-08-26 ENCOUNTER — Ambulatory Visit (INDEPENDENT_AMBULATORY_CARE_PROVIDER_SITE_OTHER): Payer: BC Managed Care – PPO | Admitting: Cardiology

## 2019-08-26 DIAGNOSIS — I050 Rheumatic mitral stenosis: Secondary | ICD-10-CM | POA: Diagnosis not present

## 2019-08-26 DIAGNOSIS — Z954 Presence of other heart-valve replacement: Secondary | ICD-10-CM

## 2019-08-26 LAB — POCT INR: INR: 3.9 — AB (ref 2.0–3.0)

## 2019-08-26 MED ORDER — POTASSIUM CHLORIDE ER 10 MEQ PO TBCR: 10.0000 meq | EXTENDED_RELEASE_TABLET | Freq: Every day | ORAL | 3 refills | Status: DC

## 2019-08-26 NOTE — Telephone Encounter (Signed)
Rx(s) sent to pharmacy electronically.  

## 2019-08-26 NOTE — Telephone Encounter (Signed)
Pt calling requesting a refill on his potassium 10 meq. There are 2 different strength on his medication list. Please address

## 2019-09-01 ENCOUNTER — Ambulatory Visit (INDEPENDENT_AMBULATORY_CARE_PROVIDER_SITE_OTHER): Payer: BC Managed Care – PPO | Admitting: Cardiovascular Disease

## 2019-09-01 ENCOUNTER — Encounter: Payer: Self-pay | Admitting: Cardiovascular Disease

## 2019-09-01 ENCOUNTER — Ambulatory Visit (INDEPENDENT_AMBULATORY_CARE_PROVIDER_SITE_OTHER): Payer: BC Managed Care – PPO | Admitting: Pharmacist

## 2019-09-01 ENCOUNTER — Other Ambulatory Visit: Payer: Self-pay

## 2019-09-01 VITALS — BP 138/86 | HR 99 | Temp 97.9°F | Ht 74.0 in | Wt 218.0 lb

## 2019-09-01 DIAGNOSIS — R600 Localized edema: Secondary | ICD-10-CM

## 2019-09-01 DIAGNOSIS — Z954 Presence of other heart-valve replacement: Secondary | ICD-10-CM | POA: Diagnosis not present

## 2019-09-01 DIAGNOSIS — I050 Rheumatic mitral stenosis: Secondary | ICD-10-CM | POA: Diagnosis not present

## 2019-09-01 DIAGNOSIS — Z952 Presence of prosthetic heart valve: Secondary | ICD-10-CM

## 2019-09-01 LAB — POCT INR: INR: 2.9 (ref 2.0–3.0)

## 2019-09-01 NOTE — Progress Notes (Signed)
09/01/2019 Darren Haynes   05-25-1986  [id number]  [facility], [facility] Cardiologist: Runell Gess MD FACP, [place], [place], [place]  HPI:  Darren Haynes is a 33 y.o.  thin  appearing married Caucasian male father 2 children who did work in Aeronautical engineer but has not worked in the last 9 months because of progressive dyspnea on exertion. He was referred by Dr. Hanley Hays for evaluation of symptomatic mitral stenosis. I last saw him in the office  07/07/2019.Thereis no history of rheumatic fever. He has no other cardiac risk factors. He is had progressive dyspnea for the last 9 months with some nonspecific aches and pains. Is being worked up by rheumatologist. Echo performed 11/21/2018 revealed normal LV systolic function, a peak mitral gradient of 47 mmHg with a mean of 29 mmHg and mitral valve area 1.5 cm with a PA pressure of 69 mmHg. He does have severe dyspnea and lower extreme edema. He is on oral diuretic.He presented today for transesophageal echocardiogram performed Dr. Royann Shivers which showed classic rheumatic mitral stenosis with a mean gradient of 15 mmHg.  I did right left heart cath revealing a significant mitral valve gradient,and normal coronary arteries.Ireferred him to Dr. Regino Schultze at [facility] who performed successful mitral balloon valvuloplasty with almost immediate improvement in his pulmonary artery pressures. 12/25/2018.Hefeltclinically improved.  Because of increasing symptoms of mitral stenosis I referred him to Dr.Owenwho ultimately performed mitral valve replacement with a Sorin [employer] bileaflet mechanical valve (29 mm) done minimally invasively on 05/14/2019.  He has done well postoperatively except for volume overload controlled with oral diuretics and tachycardia.  I did obtain a 2D echo 06/23/2019 revealed normal LV size and function, no evidence of pericardial effusion with a well-functioning aortic mechanical prosthesis.   An event monitor performed 05/25/2019 showed sinus rhythm/sinus tachycardia with occasional PVCs.    Since I saw him 2 months ago he has seen Dr. Cornelius Moras in the office as well as how [facility].  His 2D echo did reveal normal LV size and function with a well-functioning aortic mechanical prosthesis and no evidence of pericardial effusion.  His palpitations have improved as well.  His dyspnea is markedly improved in addition on oral diuretics.  He is scheduled to undergo left total hip replacement at [facility] on 10/29.  He is going to have Lovenox bridging which I agree with.  Current Meds  Medication Sig  . ADVAIR HFA 115-21 MCG/ACT inhaler Inhale 1 puff into the lungs 2 (two) times daily.  [name] Kitchen albuterol (PROVENTIL) (2.5 MG/3ML) 0.083% nebulizer solution Take 2.5 mg by nebulization 4 (four) times daily as needed for wheezing or shortness of breath.   [name] Kitchen albuterol (VENTOLIN HFA) 108 (90 Base) MCG/ACT inhaler Inhale 1-2 puffs into the lungs 4 (four) times daily as needed for wheezing or shortness of breath.   [name] Kitchen aspirin EC 81 MG tablet Take 81 mg by mouth daily.  . benztropine (COGENTIN) 0.5 MG tablet Take 0.5 mg by mouth 2 (two) times daily.  . Buprenorphine HCl (BELBUCA) 750 MCG FILM Place 750 mcg inside cheek 2 (two) times daily.   . busPIRone (BUSPAR) 15 MG tablet Take 15 mg by mouth 3 (three) times daily.  . celecoxib (CELEBREX) 200 MG capsule Take 200 mg by mouth daily.   . Cholecalciferol (VITAMIN D-1000 MAX ST) 25 MCG (1000 UT) tablet Take 1,000 Units by mouth once a week.   . clonazePAM (KLONOPIN) 0.5 MG tablet Take 0.5 mg by  mouth at bedtime.  [name] Kitchen Desvenlafaxine Succinate ER 25 MG TB24 Take 25 mg by mouth 2 (two) times daily.  . diclofenac sodium (VOLTAREN) 1 % GEL Apply 2 g topically daily as needed (pain).   . famotidine (PEPCID) 40 MG tablet Take 40 mg by mouth daily.  . folic acid (FOLVITE) 1 MG tablet Take 1 mg by mouth daily.  . furosemide (LASIX) 40 MG tablet Take 1  tablet (40 mg total) by mouth daily. You may take an extra 20mg  as needed for volume overload.  . gabapentin (NEURONTIN) 300 MG capsule Take 600 mg by mouth 3 (three) times daily.   leflunomide (ARAVA) 20 MG tablet Take 20 mg by mouth daily.  [name] Kitchen MELATONIN PO Take 1 tablet by mouth at bedtime as needed (sleep).  . metoprolol succinate (TOPROL-XL) 50 MG 24 hr tablet Take 25 mg by mouth 2 (two) times daily.  . nortriptyline (PAMELOR) 25 MG capsule Take 25 mg by mouth at bedtime.   . Oxycodone HCl 10 MG TABS Take 10 mg by mouth every 4 (four) hours as needed (pain).   [name] Kitchen penicillin v potassium (VEETID) 250 MG tablet Take 250 mg by mouth 2 (two) times a day.  . potassium chloride SA (KLOR-CON) 20 MEQ tablet Take 20 mEq by mouth daily.  . promethazine-dextromethorphan (PROMETHAZINE-DM) 6.25-15 MG/5ML syrup Take 5 mLs by mouth 4 (four) times daily as needed. (Patient taking differently: Take 5 mLs by mouth 4 (four) times daily as needed for cough. )  . tiZANidine (ZANAFLEX) 4 MG tablet Take 4 mg by mouth 3 (three) times daily.  01-14-1989 warfarin (COUMADIN) 2.5 MG tablet Take 1 to 1.5 tablets by mouth daily as directed. (Patient taking differently: Take 1.5 mg by mouth every evening. )  . [DISCONTINUED] acetaminophen (TYLENOL) 500 MG tablet Take 2 tablets (1,000 mg total) by mouth every 6 (six) hours.  . [DISCONTINUED] ALPRAZolam (NIRAVAM) 1 MG dissolvable tablet Take 0.5 mg by mouth 3 (three) times daily as needed for anxiety.   . [DISCONTINUED] potassium chloride (KLOR-CON) 10 MEQ tablet Take 1 tablet (10 mEq total) by mouth daily.  . [DISCONTINUED] sertraline (ZOLOFT) 100 MG tablet Take 200 mg by mouth at bedtime.   . [DISCONTINUED] Vitamin D, Ergocalciferol, (DRISDOL) 1.25 MG (50000 UT) CAPS capsule Take 50,000 Units by mouth every Tuesday.    Current Facility-Administered Medications for the 09/01/19 encounter (Office Visit) with 09/03/19, MD  Medication  . metoprolol tartrate (LOPRESSOR) tablet  25 mg     No Known Allergies  Social History   Socioeconomic History  . Marital status: Married    Spouse name: Not on file  . Number of children: Not on file  . Years of education: Not on file  . Highest education level: Not on file  Occupational History  . Not on file  Social Needs  . Financial resource strain: Not on file  . Food insecurity    Worry: Not on file    Inability: Not on file  . Transportation needs    Medical: Not on file    Non-medical: Not on file  Tobacco Use  . Smoking status: Never Smoker  . Smokeless tobacco: Current User    Types: Snuff  Substance and Sexual Activity  . Alcohol use: No  . Drug use: No  . Sexual activity: Not on file  Lifestyle  . Physical activity    Days per week: Not on file    Minutes per session: Not on file  .  Stress: Not on file  Relationships  . Social Herbalist on phone: Not on file    Gets together: Not on file    Attends religious service: Not on file    Active member of club or organization: Not on file    Attends meetings of clubs or organizations: Not on file    Relationship status: Not on file  . Intimate partner violence    Fear of current or ex partner: Not on file    Emotionally abused: Not on file    Physically abused: Not on file    Forced sexual activity: Not on file  Other Topics Concern  . Not on file  Social History Narrative  . Not on file     Review of Systems: General: negative for chills, fever, night sweats or weight changes.  Cardiovascular: negative for chest pain, dyspnea on exertion, edema, orthopnea, palpitations, paroxysmal nocturnal dyspnea or shortness of breath Dermatological: negative for rash Respiratory: negative for cough or wheezing Urologic: negative for hematuria Abdominal: negative for nausea, vomiting, diarrhea, bright red blood per rectum, melena, or hematemesis Neurologic: negative for visual changes, syncope, or dizziness All other systems reviewed and are  otherwise negative except as noted above.    Blood pressure 138/86, pulse 99, temperature 97.9 F (36.6 C), height 6\' 2"  (1.88 m), weight 218 lb (98.9 kg).  General appearance: alert and no distress Neck: no adenopathy, no carotid bruit, no JVD, supple, symmetrical, trachea midline and thyroid not enlarged, symmetric, no tenderness/mass/nodules Lungs: clear to auscultation bilaterally Heart: Crisp mechanical mitral valve sounds Extremities: extremities normal, atraumatic, no cyanosis or edema Pulses: 2+ and symmetric Skin: Skin color, texture, turgor normal. No rashes or lesions Neurologic: Alert and oriented X 3, normal strength and tone. Normal symmetric reflexes. Normal coordination and gait  EKG not performed today  ASSESSMENT AND PLAN:   Mitral stenosis History of rheumatic mitral stenosis status post balloon mitral valvuloplasty by Dr. Mina Marble at [facility] 12/25/2018 with improvement in his symptoms and transmitral pressures.  Ultimately, because of early recurrence, he underwent mitral valve replacement by Dr. Roxy Manns 05/14/2019 minimally invasively inserting a Sorin [employer] bileaflet mechanical valve open paren 29 mm).  He is currently on Coumadin anticoagulation.  His echo reveals this to be working properly.  He is clinically improved and denies shortness of breath.  Bilateral lower extremity edema Improved on oral diuretics.  Dyspnea on exertion Markedly improved compared to prior to intervention.      Lorretta Harp MD FACP,FACC,FAHA, [facility] 09/01/2019 11:41 AM

## 2019-09-01 NOTE — Assessment & Plan Note (Signed)
Improved on oral diuretics.

## 2019-09-01 NOTE — Assessment & Plan Note (Signed)
Markedly improved compared to prior to intervention.

## 2019-09-01 NOTE — Patient Instructions (Addendum)
Medication Instructions:  Your physician recommends that you continue on your current medications as directed. Please refer to the Current Medication list given to you today.  If you need a refill on your cardiac medications before your next appointment, please call your pharmacy.   Lab work: NONE If you have labs (blood work) drawn today and your tests are completely normal, you will receive your results only by: [name] Kitchen MyChart Message (if you have MyChart) OR . A paper copy in the mail If you have any lab test that is abnormal or we need to change your treatment, we will call you to review the results.  Testing/Procedures: Your physician has requested that you have an echocardiogram. Echocardiography is a painless test that uses sound waves to create images of your heart. It provides your doctor with information about the size and shape of your heart and how well your heart's chambers and valves are working. This procedure takes approximately one hour. There are no restrictions for this procedure.LOCATION: [facility] at [facility]: [place], [place], [place] [address] DUE AUGUST 2021  Follow-Up: At [facility], you and your health needs are our priority.  As part of our continuing mission to provide you with exceptional heart care, we have created designated Provider Care Teams.  These Care Teams include your primary Cardiologist (physician) and Advanced Practice Providers (APPs -  Physician Assistants and Nurse Practitioners) who all work together to provide you with the care you need, when you need it. You will need a follow up appointment in 6 months with HAO MENG, PA-C AND IN 12 MONTHS WITH Dr. Quay Burow.  Please call our office 2 months in advance to schedule each appointment.

## 2019-09-01 NOTE — Assessment & Plan Note (Signed)
History of rheumatic mitral stenosis status post balloon mitral valvuloplasty by Dr. Mina Marble at [facility] 12/25/2018 with improvement in his symptoms and transmitral pressures.  Ultimately, because of early recurrence, he underwent mitral valve replacement by Dr. Roxy Manns 05/14/2019 minimally invasively inserting a Sorin [employer] bileaflet mechanical valve open paren 29 mm).  He is currently on Coumadin anticoagulation.  His echo reveals this to be working properly.  He is clinically improved and denies shortness of breath.

## 2019-09-10 DIAGNOSIS — M87052 Idiopathic aseptic necrosis of left femur: Secondary | ICD-10-CM | POA: Insufficient documentation

## 2019-09-12 LAB — POCT INR: INR: 1.4 — AB (ref 2.0–3.0)

## 2019-09-14 ENCOUNTER — Ambulatory Visit (INDEPENDENT_AMBULATORY_CARE_PROVIDER_SITE_OTHER): Payer: BC Managed Care – PPO | Admitting: Cardiology

## 2019-09-14 DIAGNOSIS — Z954 Presence of other heart-valve replacement: Secondary | ICD-10-CM

## 2019-09-14 DIAGNOSIS — I050 Rheumatic mitral stenosis: Secondary | ICD-10-CM

## 2019-09-16 ENCOUNTER — Ambulatory Visit: Payer: BC Managed Care – PPO | Admitting: Cardiovascular Disease

## 2019-09-16 LAB — POCT INR: INR: 2.5 (ref 2.0–3.0)

## 2019-09-17 IMAGING — DX CHEST - 2 VIEW
2 series · 2 of 2 positions shown · non-contrast
Comparison: Radiograph 06/15/2019

CLINICAL DATA: Per pt: chest pain, left side of ribs, pain for
three days, more pain upon inspiration. No known exposure to covid,
patient is in cardiac rehab, 05/14/19 had mitrovalve replacement,
prior to surgery for the past four month. Chest pain

EXAM:
CHEST - 2 VIEW

[chest pa]
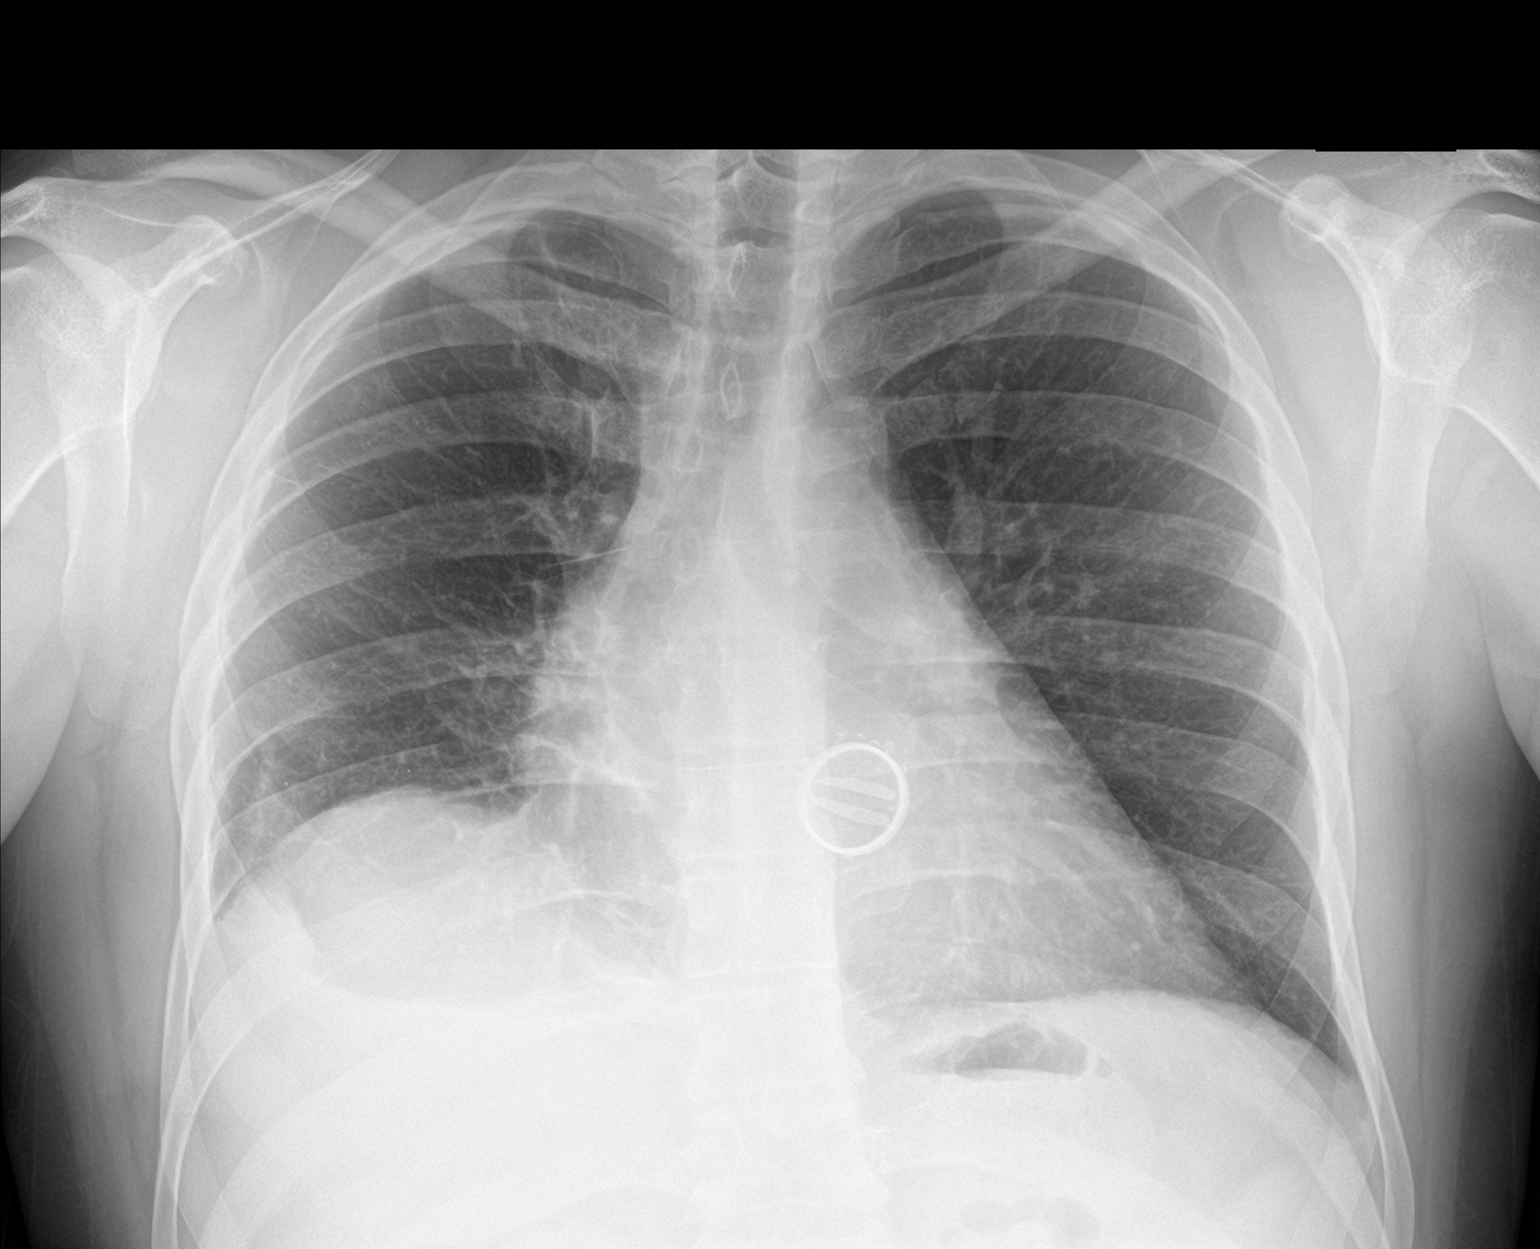

[chest lat]
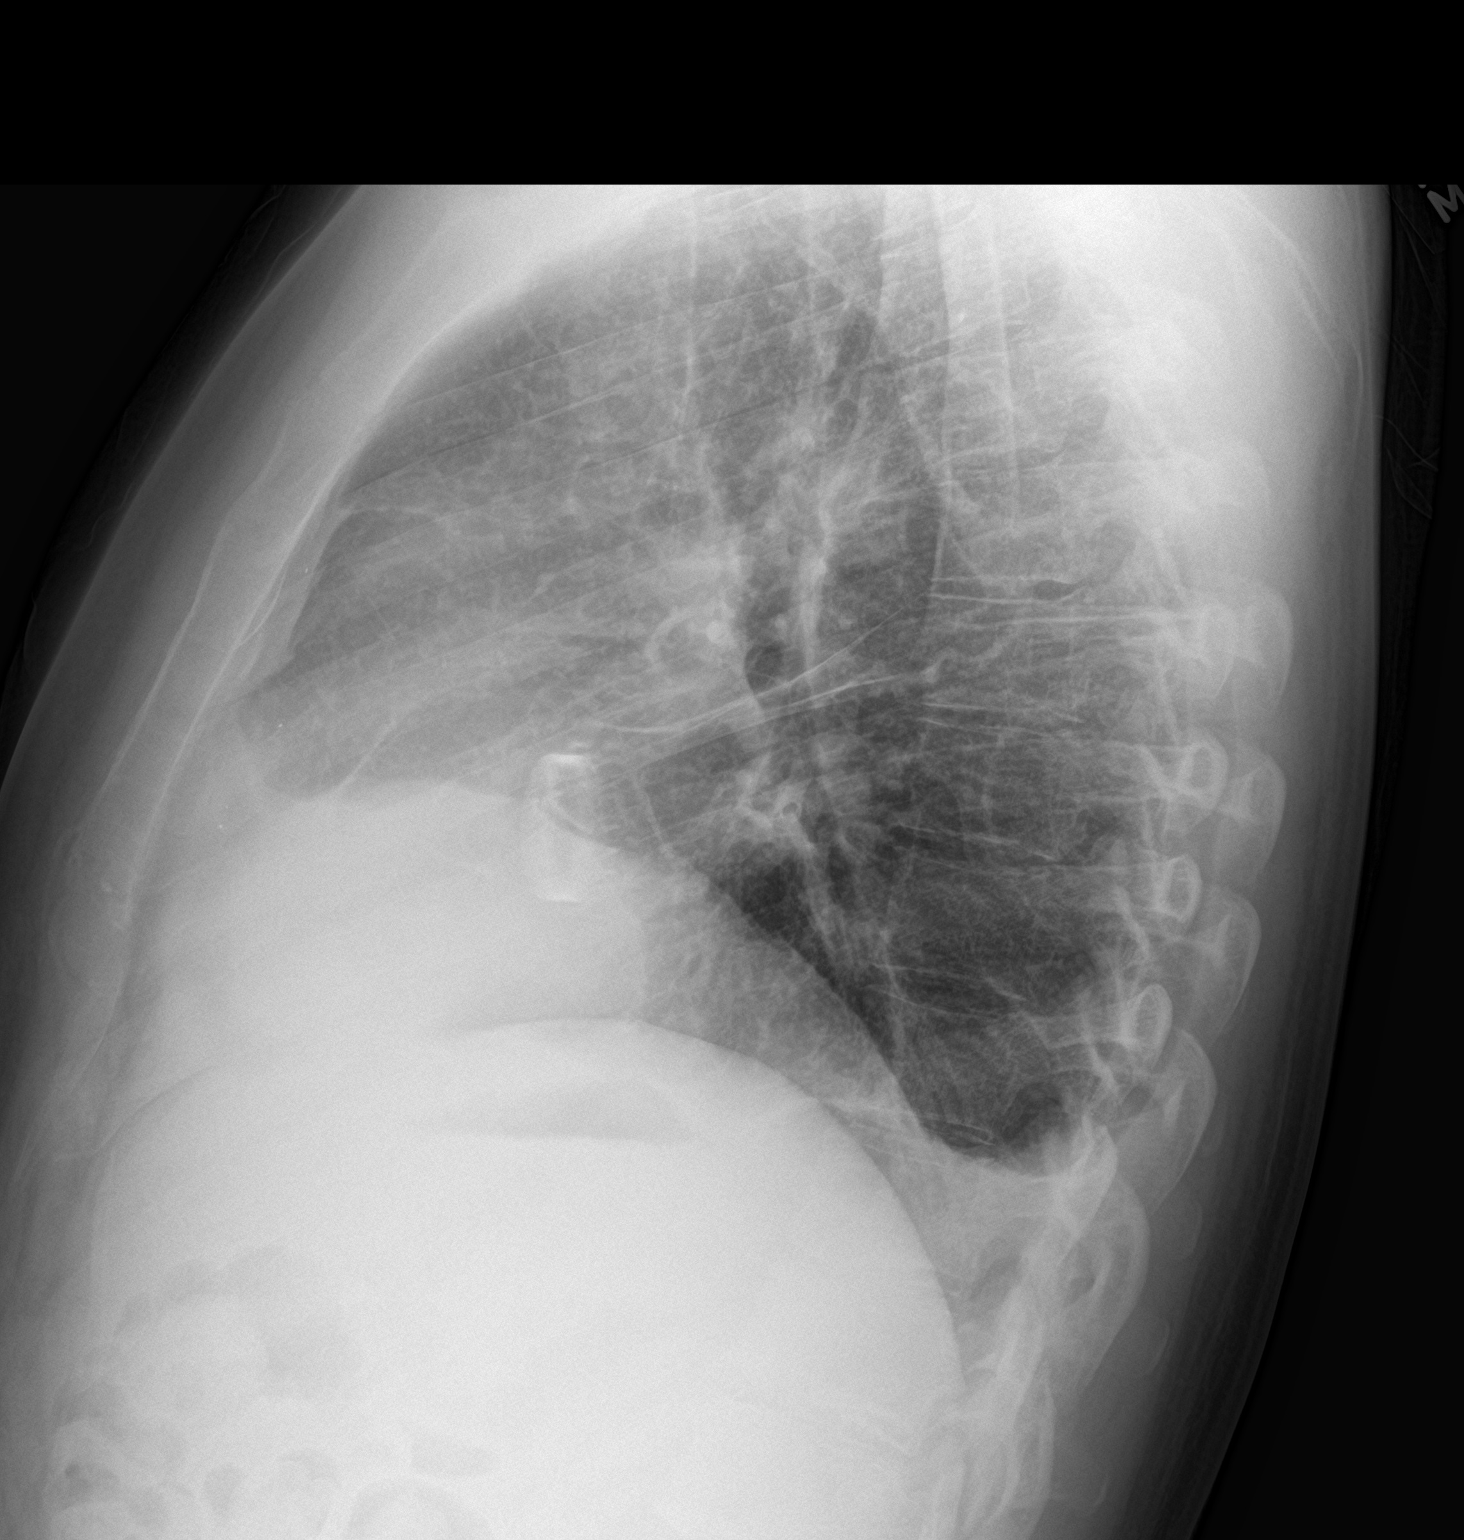

[2 of 2 positions shown; findings below may reference images not displayed]

FINDINGS: Normal cardiac silhouette. Volume loss in the RIGHT hemithorax with
basilar atelectasis. Valvular prosthetic device noted. Small RIGHT
effusion on lateral projection new from prior.
IMPRESSION: 1. Small new RIGHT pleural effusion.
2. Stable RIGHT lower lobe atelectasis.
3. No pulmonary edema or infiltrate identified

## 2019-09-23 ENCOUNTER — Telehealth: Payer: Self-pay | Admitting: Cardiovascular Disease

## 2019-09-23 NOTE — Telephone Encounter (Signed)
  Darren Haynes would like to discuss this patient with Dr Gwenlyn Found

## 2019-09-24 ENCOUNTER — Ambulatory Visit (INDEPENDENT_AMBULATORY_CARE_PROVIDER_SITE_OTHER): Payer: BC Managed Care – PPO | Admitting: Cardiovascular Disease

## 2019-09-24 DIAGNOSIS — I050 Rheumatic mitral stenosis: Secondary | ICD-10-CM | POA: Diagnosis not present

## 2019-09-24 DIAGNOSIS — Z954 Presence of other heart-valve replacement: Secondary | ICD-10-CM | POA: Diagnosis not present

## 2019-09-25 DIAGNOSIS — Z96642 Presence of left artificial hip joint: Secondary | ICD-10-CM | POA: Insufficient documentation

## 2019-09-29 ENCOUNTER — Ambulatory Visit (INDEPENDENT_AMBULATORY_CARE_PROVIDER_SITE_OTHER): Payer: BC Managed Care – PPO | Admitting: Pharmacist Clinician (PhC)/ Clinical Pharmacy Specialist

## 2019-09-29 DIAGNOSIS — Z7901 Long term (current) use of anticoagulants: Secondary | ICD-10-CM | POA: Diagnosis not present

## 2019-09-29 DIAGNOSIS — I050 Rheumatic mitral stenosis: Secondary | ICD-10-CM | POA: Diagnosis not present

## 2019-09-29 DIAGNOSIS — Z954 Presence of other heart-valve replacement: Secondary | ICD-10-CM | POA: Diagnosis not present

## 2019-09-29 LAB — POCT INR: INR: 3.2 — AB (ref 2.0–3.0)

## 2019-10-12 ENCOUNTER — Other Ambulatory Visit: Payer: Self-pay | Admitting: Physician Assistant

## 2019-10-13 LAB — POCT INR: INR: 3.7 — AB (ref 2.0–3.0)

## 2019-10-14 ENCOUNTER — Ambulatory Visit (INDEPENDENT_AMBULATORY_CARE_PROVIDER_SITE_OTHER): Payer: BC Managed Care – PPO | Admitting: Cardiovascular Disease

## 2019-10-14 DIAGNOSIS — Z954 Presence of other heart-valve replacement: Secondary | ICD-10-CM | POA: Diagnosis not present

## 2019-10-14 DIAGNOSIS — I050 Rheumatic mitral stenosis: Secondary | ICD-10-CM

## 2019-10-14 MED ORDER — METOPROLOL SUCCINATE ER 25 MG PO TB24: 25.0000 mg | ORAL_TABLET | Freq: Two times a day (BID) | ORAL | 3 refills | Status: DC

## 2019-10-26 LAB — POCT INR: INR: 2.1 (ref 2.0–3.0)

## 2019-10-28 ENCOUNTER — Ambulatory Visit (INDEPENDENT_AMBULATORY_CARE_PROVIDER_SITE_OTHER): Payer: BC Managed Care – PPO | Admitting: Cardiology

## 2019-10-28 DIAGNOSIS — Z954 Presence of other heart-valve replacement: Secondary | ICD-10-CM

## 2019-10-28 DIAGNOSIS — I050 Rheumatic mitral stenosis: Secondary | ICD-10-CM

## 2019-10-31 ENCOUNTER — Emergency Department (HOSPITAL_COMMUNITY): Payer: BC Managed Care – PPO

## 2019-10-31 ENCOUNTER — Encounter (HOSPITAL_COMMUNITY): Payer: Self-pay | Admitting: Emergency Medicine

## 2019-10-31 ENCOUNTER — Other Ambulatory Visit: Payer: Self-pay

## 2019-10-31 ENCOUNTER — Emergency Department (HOSPITAL_COMMUNITY)
Admission: EM | Admit: 2019-10-31 | Discharge: 2019-10-31 | Disposition: A | Discharge status: Other Acute Inpatient Hospital | Payer: BC Managed Care – PPO | Attending: Emergency Medicine | Admitting: Emergency Medicine

## 2019-10-31 DIAGNOSIS — R0602 Shortness of breath: Secondary | ICD-10-CM | POA: Insufficient documentation

## 2019-10-31 DIAGNOSIS — Z20828 Contact with and (suspected) exposure to other viral communicable diseases: Secondary | ICD-10-CM | POA: Insufficient documentation

## 2019-10-31 DIAGNOSIS — Z7982 Long term (current) use of aspirin: Secondary | ICD-10-CM | POA: Insufficient documentation

## 2019-10-31 DIAGNOSIS — M009 Pyogenic arthritis, unspecified: Secondary | ICD-10-CM | POA: Diagnosis not present

## 2019-10-31 DIAGNOSIS — Z79899 Other long term (current) drug therapy: Secondary | ICD-10-CM | POA: Insufficient documentation

## 2019-10-31 DIAGNOSIS — Z7901 Long term (current) use of anticoagulants: Secondary | ICD-10-CM | POA: Diagnosis not present

## 2019-10-31 DIAGNOSIS — R197 Diarrhea, unspecified: Secondary | ICD-10-CM | POA: Diagnosis not present

## 2019-10-31 DIAGNOSIS — R079 Chest pain, unspecified: Secondary | ICD-10-CM | POA: Diagnosis present

## 2019-10-31 DIAGNOSIS — I509 Heart failure, unspecified: Secondary | ICD-10-CM | POA: Diagnosis not present

## 2019-10-31 LAB — CBC
HCT: 40.4 % (ref 39.0–52.0)
Hemoglobin: 13.1 g/dL (ref 13.0–17.0)
MCH: 27.8 pg (ref 26.0–34.0)
MCHC: 32.4 g/dL (ref 30.0–36.0)
MCV: 85.6 fL (ref 80.0–100.0)
Platelets: 173 10*3/uL (ref 150–400)
RBC: 4.72 MIL/uL (ref 4.22–5.81)
RDW: 14.6 % (ref 11.5–15.5)
WBC: 4.9 10*3/uL (ref 4.0–10.5)
nRBC: 0 % (ref 0.0–0.2)

## 2019-10-31 LAB — BASIC METABOLIC PANEL
Anion gap: 11 (ref 5–15)
BUN: 12 mg/dL (ref 6–20)
CO2: 24 mmol/L (ref 22–32)
Calcium: 8.6 mg/dL — ABNORMAL LOW (ref 8.9–10.3)
Chloride: 102 mmol/L (ref 98–111)
Creatinine, Ser: 0.97 mg/dL (ref 0.61–1.24)
GFR calc Af Amer: >60 mL/min (ref >60)
GFR calc non Af Amer: >60 mL/min (ref >60)
Glucose, Bld: 107 mg/dL — ABNORMAL HIGH (ref 70–99)
Potassium: 2.8 mmol/L — ABNORMAL LOW (ref 3.5–5.1)
Sodium: 137 mmol/L (ref 135–145)

## 2019-10-31 LAB — POC SARS CORONAVIRUS 2 AG -  ED: SARS Coronavirus 2 Ag: NEGATIVE

## 2019-10-31 LAB — TROPONIN I (HIGH SENSITIVITY)
Troponin I (High Sensitivity): <2 ng/L (ref <18)
Troponin I (High Sensitivity): 3 ng/L (ref <18)

## 2019-10-31 LAB — PROTIME-INR
INR: 2.2 — ABNORMAL HIGH (ref 0.8–1.2)
Prothrombin Time: 24.7 seconds — ABNORMAL HIGH (ref 11.4–15.2)

## 2019-10-31 LAB — BRAIN NATRIURETIC PEPTIDE: B Natriuretic Peptide: 26.8 pg/mL (ref 0.0–100.0)

## 2019-10-31 MED ORDER — ACETAMINOPHEN 325 MG PO TABS: 650.0000 mg | ORAL_TABLET | Freq: Once | ORAL | Status: DC

## 2019-10-31 MED ORDER — PIPERACILLIN-TAZOBACTAM 3.375 G IVPB 30 MIN
3.3750 g | Freq: Once | INTRAVENOUS | Status: AC
  Administered 2019-10-31: 3.375 g via INTRAVENOUS
  Filled 2019-10-31: qty 50

## 2019-10-31 MED ORDER — POTASSIUM CHLORIDE CRYS ER 20 MEQ PO TBCR
40.0000 meq | EXTENDED_RELEASE_TABLET | Freq: Once | ORAL | Status: AC
  Administered 2019-10-31: 40 meq via ORAL
  Filled 2019-10-31: qty 2

## 2019-10-31 MED ORDER — GADOBUTROL 1 MMOL/ML IV SOLN
10.0000 mL | Freq: Once | INTRAVENOUS | Status: AC | PRN
  Administered 2019-10-31: 10 mL via INTRAVENOUS

## 2019-10-31 MED ORDER — ACETAMINOPHEN 325 MG PO TABS
ORAL_TABLET | ORAL | Status: AC
  Filled 2019-10-31: qty 2

## 2019-10-31 MED ORDER — ONDANSETRON HCL 4 MG/2ML IJ SOLN
INTRAMUSCULAR | Status: AC
  Administered 2019-10-31: 4 mg
  Filled 2019-10-31: qty 2

## 2019-10-31 MED ORDER — DICYCLOMINE HCL 10 MG PO CAPS
10.0000 mg | ORAL_CAPSULE | Freq: Three times a day (TID) | ORAL | Status: DC
  Administered 2019-10-31: 11:00:00 10 mg via ORAL
  Filled 2019-10-31: qty 1

## 2019-10-31 MED ORDER — SODIUM CHLORIDE 0.9% FLUSH: 3.0000 mL | Freq: Once | INTRAVENOUS | Status: DC

## 2019-10-31 NOTE — ED Notes (Signed)
ED Provider at bedside. 

## 2019-10-31 NOTE — ED Notes (Signed)
Patient transported to MRI 

## 2019-10-31 NOTE — ED Notes (Signed)
RN Levan Hurst at [facility] to confirm patient placement. Patient Placement has been confirmed.   [place], [id number]  Receiving MD: Shon Baton, Orthopedic MD Sending MD: Carmin Muskrat, Emergency MD

## 2019-10-31 NOTE — ED Notes (Signed)
Patient transported to X-ray 

## 2019-10-31 NOTE — ED Provider Notes (Signed)
[facility]-EMERGENCY DEPT Provider Note   CSN: [id number] Arrival date & time: 10/31/19  [id number]     History Chief Complaint  Patient presents with  . Chest Pain  . Diarrhea    Darren Haynes is a 33 y.o. male.  Patient presents with approximately 1 week of left-sided chest pain, shortness of breath, 2 days of diarrhea and generalized abdominal discomfort.  Also endorses nightly chills.  Patient has  has a past medical history of Arthritis, Avascular necrosis, CHF, Chronic back pain, Depression, Dyspnea, GERD, Heart murmur, Mitral valve stenosis, Panic attacks, Pneumonia, Pulmonary hypertension, Rheumatic fever, and S/P minimally invasive mitral valve replacement with mechanical valve.  Patient anticoagulated with warfarin.  Reports his weight was increased last week by 7 pounds.  He was a advised to increase his Lasix dose which he did and he says his weight is back to baseline.  The history is provided by the patient.       Past Medical History:  Diagnosis Date  . Arthritis   . Avascular necrosis (HCC)   . CHF (congestive heart failure) (HCC)   . Chronic back pain   . Depression   . Dyspnea   . GERD (gastroesophageal reflux disease)   . Heart murmur   . Mitral valve stenosis   . Panic attacks   . Pneumonia    hx 4 time  . Pulmonary hypertension (HCC)   . Rheumatic fever   . 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 Heart rate fast 07/07/2019  . 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 PSVT (paroxysmal supraventricular tachycardia) (HCC) 03/12/2019  . Nonsustained ventricular tachycardia (HCC) 03/12/2019  . Bilateral lower extremity edema 03/12/2019  . Dyspnea on exertion 03/12/2019  . Mitral stenosis 12/09/2018    Past  Surgical History:  Procedure Laterality Date  . BALLOON VALVULOPLASTY  12/24/2018   [facility]  . MITRAL VALVE REPLACEMENT Right 05/14/2019   Procedure: MINIMALLY INVASIVE MITRAL VALVE (MV) REPLACEMENT USING CARBOMEDICS OPTIFORM Size 29mm;  Surgeon: Purcell Nailswen, Clarence H, MD;  [facility];  Service: Open Heart Surgery;  Laterality: Right;  . RIGHT/LEFT HEART CATH AND CORONARY ANGIOGRAPHY N/A 12/11/2018   Procedure: RIGHT/LEFT HEART CATH AND CORONARY ANGIOGRAPHY;  Surgeon: Runell GessBerry, Jonathan J, MD;  Location: [facility] INVASIVE CV LAB;  Service: Cardiovascular;  Laterality: N/A;  . TEE WITHOUT CARDIOVERSION N/A 12/11/2018   Procedure: TRANSESOPHAGEAL ECHOCARDIOGRAM (TEE);  Surgeon: Thurmon Fairroitoru, Mihai, MD;  [facility];  Service: Cardiovascular;  Laterality: N/A;  . TEE WITHOUT CARDIOVERSION N/A 03/27/2019   Procedure: TRANSESOPHAGEAL ECHOCARDIOGRAM (TEE);  Surgeon: Elease HashimotoNahser, Deloris PingPhilip J, MD;  [facility];  Service: Cardiovascular;  Laterality: N/A;  . TEE WITHOUT CARDIOVERSION N/A 05/14/2019   Procedure: TRANSESOPHAGEAL ECHOCARDIOGRAM (TEE);  Surgeon: Purcell Nailswen, Clarence H, MD;  [facility];  Service: Open Heart Surgery;  Laterality: N/A;  . TYMPANOSTOMY TUBE PLACEMENT         Family History  Problem Relation Age of Onset  . Heart disease Mother   . Depression Mother   . Hypertension Father   . Hyperlipidemia Father     Social History   Tobacco Use  . Smoking status: Never Smoker  . Smokeless tobacco: Current User    Types: Snuff  Substance Use Topics  .  Alcohol use: No  . Drug use: No    Home Medications Prior to Admission medications   Medication Sig Start Date End Date Taking? Authorizing Provider  ADVAIR HFA 115-21 MCG/ACT inhaler Inhale 1 puff into the lungs 2 (two) times daily. 08/07/19   [provider]  albuterol (PROVENTIL) (2.5 MG/3ML) 0.083% nebulizer solution Take 2.5 mg by nebulization 4 (four) times daily as needed for wheezing or  shortness of breath.  07/02/19   [provider]  albuterol (VENTOLIN HFA) 108 (90 Base) MCG/ACT inhaler Inhale 1-2 puffs into the lungs 4 (four) times daily as needed for wheezing or shortness of breath.  07/08/19   [provider]  aspirin EC 81 MG tablet Take 81 mg by mouth daily.    [provider]  benztropine (COGENTIN) 0.5 MG tablet Take 0.5 mg by mouth 2 (two) times daily.    [provider]  Buprenorphine HCl (BELBUCA) 750 MCG FILM Place 750 mcg inside cheek 2 (two) times daily.     [provider]  busPIRone (BUSPAR) 15 MG tablet Take 15 mg by mouth 3 (three) times daily. 07/15/19   [provider]  celecoxib (CELEBREX) 200 MG capsule Take 200 mg by mouth daily.  06/30/19   [provider]  Cholecalciferol (VITAMIN D-1000 MAX ST) 25 MCG (1000 UT) tablet Take 1,000 Units by mouth once a week.     [provider]  clonazePAM (KLONOPIN) 0.5 MG tablet Take 0.5 mg by mouth at bedtime.    [provider]  Desvenlafaxine Succinate ER 25 MG TB24 Take 25 mg by mouth 2 (two) times daily.    [provider]  diclofenac sodium (VOLTAREN) 1 % GEL Apply 2 g topically daily as needed (pain).  06/19/19 06/18/20  [provider]  famotidine (PEPCID) 40 MG tablet Take 40 mg by mouth daily.    [provider]  folic acid (FOLVITE) 1 MG tablet Take 1 mg by mouth daily. 07/29/19   [provider]  furosemide (LASIX) 40 MG tablet Take 1 tablet (40 mg total) by mouth daily. You may take an extra 20mg  as needed for volume overload. 07/23/19   09/22/19, PA  gabapentin (NEURONTIN) 300 MG capsule Take 600 mg by mouth 3 (three) times daily.     [provider]  leflunomide (ARAVA) 20 MG tablet Take 20 mg by mouth daily.    [provider]  MELATONIN PO Take 1 tablet by mouth at bedtime as needed (sleep).    [provider]  metoprolol succinate (TOPROL-XL) 25 MG 24 hr tablet Take 1  tablet (25 mg total) by mouth 2 (two) times daily. 10/14/19   14/2/20, MD  nortriptyline (PAMELOR) 25 MG capsule Take 25 mg by mouth at bedtime.  10/24/18   [provider]  Oxycodone HCl 10 MG TABS Take 10 mg by mouth every 4 (four) hours as needed (pain).  07/08/19   [provider]  penicillin v potassium (VEETID) 250 MG tablet Take 250 mg by mouth 2 (two) times a day.    [provider]  potassium chloride SA (KLOR-CON) 20 MEQ tablet Take 20 mEq by mouth daily. 07/29/19   [provider]  promethazine-dextromethorphan (PROMETHAZINE-DM) 6.25-15 MG/5ML syrup Take 5 mLs by mouth 4 (four) times daily as needed. Patient taking differently: Take 5 mLs by mouth 4 (four) times daily as needed for cough.  08/16/19   10/16/19, FNP  tiZANidine (ZANAFLEX) 4 MG  tablet Take 4 mg by mouth 3 (three) times daily. 04/22/19   [provider]  warfarin (COUMADIN) 2.5 MG tablet Take 1 to 1.5 tablets by mouth daily as directed. Patient taking differently: Take 1.5 mg by mouth every evening.  06/24/19   Lorretta Harp, MD    Allergies    Patient has no known allergies.  Review of Systems   Review of Systems As per HPI Physical Exam Updated Vital Signs BP 136/83 (BP Location: Left Arm)   Pulse 95   Temp 98 F (36.7 C) (Oral)   Resp 20   Ht 6\' 2"  (1.88 m)   Wt 98.9 kg   SpO2 100%   BMI 27.99 kg/m   Physical Exam Constitutional:      Appearance: He is well-developed.  HENT:     Head: Normocephalic and atraumatic.  Eyes:     Extraocular Movements: Extraocular movements intact.     Pupils: Pupils are equal, round, and reactive to light.  Neck:     Vascular: No JVD.  Cardiovascular:     Rate and Rhythm: Normal rate and regular rhythm.     Heart sounds: Normal heart sounds.  Pulmonary:     Effort: Pulmonary effort is normal. No respiratory distress.     Breath sounds: Normal breath sounds.  Chest:     Chest wall: No tenderness.   Abdominal:     Palpations: Abdomen is soft.     Tenderness: There is no abdominal tenderness. There is no guarding or rebound.  Musculoskeletal:     Cervical back: Normal range of motion.     Right lower leg: No edema.     Left lower leg: No edema.  Skin:    General: Skin is warm and dry.  Neurological:     General: No focal deficit present.     Mental Status: He is alert and oriented to person, place, and time.  Psychiatric:        Mood and Affect: Mood normal.        Behavior: Behavior normal.     ED Results / Procedures / Treatments   Labs (all labs ordered are listed, but only abnormal results are displayed) Labs Reviewed  NOVEL CORONAVIRUS, NAA (HOSP ORDER, SEND-OUT TO REF LAB; TAT 18-24 HRS)  BASIC METABOLIC PANEL  CBC  BRAIN NATRIURETIC PEPTIDE  TROPONIN I (HIGH SENSITIVITY)    EKG EKG Interpretation  Date/Time:  Saturday October 31 2019 09:52:05 EST Ventricular Rate:  94 PR Interval:    QRS Duration: 92 QT Interval:  358 QTC Calculation: 448 R Axis:   36 Text Interpretation: Sinus rhythm Prolonged PR interval Inferior infarct, old Artifact T wave abnormality Abnormal ECG Confirmed by Carmin Muskrat [phone]) on 10/31/2019 10:04:43 AM   Radiology No results found.  Procedures Procedures (including critical care time)  Medications Ordered in ED Medications - No data to display  ED Course  I have reviewed the triage vital signs and the nursing notes.  Pertinent labs & imaging results that were available during my care of the patient were reviewed by me and considered in my medical decision making (see chart for details).    MDM Rules/Calculators/A&P                      Patient with a significant cardiac history including HFpEF from mitral stenosis status post mechanical heart valve on chronic anticoagulation, pulmonary hypertension presents with worsening shortness of breath, chest pain, 2 days of diarrhea.  Endorses chills.  Echocardiogram on  06/2019 showed an EF of 50-55%.  Patient does not look fluid overloaded on exam.  Lungs are clear, no JVD, no lower extremity swelling.  VSS and satting well on room air.  No increased work of breathing.  Chest pain differential includes MI, heart failure exacerbation, possibly COVID-19 versus other respiratory infection.  GI symptoms possibly from acute gastritis, also possibly COVID-19.  We will do broad work-up including CBC, EKG, BMP, BNP, PT/INR, troponin.  EKG without ischemic changes.  Update Patient with h/o AVN of left hip s/p replacement in 08/2019. Patient with purulent drainage at surgical incision site. Patient reports surgical wound has gotten larger. Will get Blood culture for possible bacteremia and MR Hip to evaluate for abscess vs. Cellulits.   Update CBC normal white count. CMP only significant for hypokalemia of 2.8. Repleted with Kdur 40 mgx1. MR Hip pending. Patient signed out to Dr. Jeraldine Loots.   Final Clinical Impression(s) / ED Diagnoses Final diagnoses:  None    Rx / DC Orders ED Discharge Orders    None       Garnette Gunner, MD 10/31/19 1327    Gerhard Munch, MD 11/01/19 [phone]

## 2019-10-31 NOTE — ED Triage Notes (Signed)
Pt states he has a hx of rheumatic heart disease and has had chest pain x 1 week. Hx of valve replacement this year in July and L hip replacement from avascular necrosis on 10/29. Also endorses diarrhea x 2 days. Alert and oriented

## 2019-10-31 NOTE — ED Notes (Signed)
RN has spoken to Imaging, Imaging Tech is in process of [facility] at this time.

## 2019-10-31 NOTE — ED Notes (Signed)
Patient is leaving facility now via [place].

## 2019-10-31 NOTE — ED Notes (Signed)
Patient belongings have been bagged and tagged with patient label for transport.

## 2019-10-31 NOTE — ED Notes (Signed)
RN has spoken with Darren Haynes at [facility] and given report. RN will now call [facility] to inform them of patient being transferred from ED to ED.

## 2019-10-31 NOTE — ED Notes (Signed)
RN has called [employer] [facility] and has informed them of patient being transported via [place] to [place] in about 10 to 15 minutes from now.

## 2019-10-31 NOTE — ED Provider Notes (Signed)
  The patient was seen in conjunction with the resident physician.  The documentation accurately reflects the patient's evaluation in the Emergency Department, with the following additions / clarifications. This adult male with multiple medical issues including rheumatic heart disease, now status post mitral valve repair, on anticoagulation. Patient also has history of left hip prosthesis, performed 2 months ago [employer]. Patient is now presenting with night sweats, diarrhea, generalized discomfort, and purulent drainage in his left hip.  Initial findings reassuring, labs reassuring, but patient's drainage, pain in that left upper concerning, prompting MRI.   3:16 PM Patient aware of all findings thus far including MRI results, which I have discussed with his surgical team at [facility]. With concern for infected prosthetic surgical site, the patient is started Zosyn, will require transfer for further evaluation, monitoring, management.  CRITICAL CARE Performed by: Carmin Muskrat Total critical care time: 40 minutes Critical care time was exclusive of separately billable procedures and treating other patients. Critical care was necessary to treat or prevent imminent or life-threatening deterioration. Critical care was time spent personally by me on the following activities: development of treatment plan with patient and/or surrogate as well as nursing, discussions with consultants, evaluation of patient's response to treatment, examination of patient, obtaining history from patient or surrogate, ordering and performing treatments and interventions, ordering and review of laboratory studies, ordering and review of radiographic studies, pulse oximetry and re-evaluation of patient's condition.      Carmin Muskrat, MD 10/31/19 1520

## 2019-10-31 NOTE — ED Notes (Signed)
RN has spoken to MRI Tech and MRI Tech will be at [facility] to transport patient to MRI ASAP.

## 2019-10-31 NOTE — ED Notes (Signed)
[facility] has been called to place patient on list for transport.

## 2019-10-31 NOTE — ED Notes (Signed)
Left Hip wound has been dressed and covered for transport.

## 2019-11-01 LAB — NOVEL CORONAVIRUS, NAA (HOSP ORDER, SEND-OUT TO REF LAB; TAT 18-24 HRS): SARS-CoV-2, NAA: NOT DETECTED

## 2019-11-05 LAB — CULTURE, BLOOD (SINGLE): Culture: NO GROWTH

## 2019-11-08 DIAGNOSIS — T8452XA Infection and inflammatory reaction due to internal left hip prosthesis, initial encounter: Secondary | ICD-10-CM | POA: Insufficient documentation

## 2019-11-10 LAB — POCT INR: INR: 2.6 (ref 2.0–3.0)

## 2019-11-11 ENCOUNTER — Ambulatory Visit (INDEPENDENT_AMBULATORY_CARE_PROVIDER_SITE_OTHER): Payer: BC Managed Care – PPO | Admitting: Cardiovascular Disease

## 2019-11-11 DIAGNOSIS — I050 Rheumatic mitral stenosis: Secondary | ICD-10-CM

## 2019-11-11 DIAGNOSIS — Z954 Presence of other heart-valve replacement: Secondary | ICD-10-CM

## 2019-11-12 ENCOUNTER — Ambulatory Visit (INDEPENDENT_AMBULATORY_CARE_PROVIDER_SITE_OTHER): Payer: BC Managed Care – PPO | Admitting: Family Medicine

## 2019-11-12 ENCOUNTER — Other Ambulatory Visit: Payer: Self-pay

## 2019-11-12 ENCOUNTER — Encounter: Payer: Self-pay | Admitting: Family Medicine

## 2019-11-12 DIAGNOSIS — M25571 Pain in right ankle and joints of right foot: Secondary | ICD-10-CM

## 2019-11-12 DIAGNOSIS — M25572 Pain in left ankle and joints of left foot: Secondary | ICD-10-CM | POA: Diagnosis not present

## 2019-11-12 DIAGNOSIS — M25552 Pain in left hip: Secondary | ICD-10-CM | POA: Diagnosis not present

## 2019-11-12 DIAGNOSIS — N4340 Spermatocele of epididymis, unspecified: Secondary | ICD-10-CM | POA: Insufficient documentation

## 2019-11-12 NOTE — Progress Notes (Signed)
Office Visit Note   Patient: Darren Haynes           Date of Birth: 06/04/1986           MRN: [id number] Visit Date: 11/12/2019 Requested by: Center, [facility] [address] [place],  [place] [address] PCP: Center, [facility]  Subjective: Chief Complaint  Patient presents with  . Left Hip - Pain    S/p left hip replacement 09/10/19, got infected, surgery again 12/21 (hip dislocated right after) & again on 12/23 to have the correct spacer placed. Patient would like to transfer to a surgeon here.    HPI: This is a very nice 33 year old gentleman who wants to find a new hip surgeon.  He was not happy with the care he was receiving at [facility].  He has a history of inflammatory arthritis.  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  In October he underwent hip replacement on the 29th.  He developed infection postoperatively but he states that he went back to his surgeon multiple times and they were not taking him seriously.  Finally in December he went to the ER and had an MRI scan which confirmed septic arthritis.  He was transported to [facility] where they removed his artificial joint and put in a spacer and he is now on IV Ancef through a right arm PICC line.  He seems to be doing well, denies any fevers or chills and has not had any redness around his wound like he did after the first surgery.  He has chronic pain related to his inflammatory arthritis and he is managed at [facility] clinic with narcotic medication.  He feels that his pain is under adequate control right now.  Also this past year he developed severe shortness of breath and was eventually diagnosed with mitral valve gnosis.  He subsequently underwent valve replacement and is doing well from that standpoint, monitored by cardiology.                ROS:   All other systems were reviewed and are negative.  Objective: Vital Signs: There were no vitals taken for  this visit.  Physical Exam:  General:  Alert and oriented, in no acute distress. Pulm:  Breathing unlabored. Psy:  Normal mood, congruent affect. Skin: Wound is covered with Tegaderm.  There is no surrounding cellulitis.   Imaging: None today  Assessment & Plan: 1.  Left hip pain status post hardware removal for septic arthritis -Unfortunately he was scheduled with me inadvertently today and he understands that I am not able to manage this for him.  He is stable right now and I will arrange an appointment for him with Dr. Magnus Ivan in the near future.     Procedures: No procedures performed  No notes on file     PMFS History: Patient Active Problem List   Diagnosis Date Noted  . Spermatocele 11/12/2019  . Left hip prosthetic joint infection (HCC) 11/08/2019  . Status post hip replacement, left 09/25/2019  . Avascular necrosis of bone of left hip (HCC) 09/10/2019  . Anxiety and depression 08/21/2019  . Chronic diastolic heart failure (HCC) 08/21/2019  . Chronic pain 08/21/2019  . Inflammatory arthritis 08/21/2019  . Rheumatic heart disease 08/21/2019  . Dry eye syndrome of both eyes 08/13/2019  . Monocular diplopia 08/13/2019  . Transient visual loss, bilateral 08/13/2019  . Heart rate fast 07/07/2019  . Dysfunction of both eustachian tubes  06/17/2019  . 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 PSVT (paroxysmal supraventricular tachycardia) ([place]) 03/12/2019  . Nonsustained ventricular tachycardia ([place]) 03/12/2019  . Bilateral lower extremity edema 03/12/2019  . Dyspnea on exertion 03/12/2019  . Tobacco use disorder 01/20/2019  . Mitral stenosis 12/09/2018  . Pulmonary hypertension ([place]) 11/21/2018  . Neck pain 07/08/2018  . Rib pain 06/05/2018  . Acute otitis externa of left ear 06/04/2016  . Acute suppurative otitis media of left ear with spontaneous rupture of tympanic membrane 06/04/2016  . Gastroesophageal reflux  disease without esophagitis 06/15/2014  . Obesity (BMI 30-39.9) 06/15/2014  . OSA (obstructive sleep apnea) 01/07/2014   Past Medical History:  Diagnosis Date  . Arthritis   . Avascular necrosis ([place])   . CHF (congestive heart failure) ([place])   . Chronic back pain   . Depression   . Dyspnea   . GERD (gastroesophageal reflux disease)   . Heart murmur   . Mitral valve stenosis   . Panic attacks   . Pneumonia    hx 4 time  . Pulmonary hypertension ([place])   . Rheumatic fever   . 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 Heart disease Mother   . Depression Mother   . Hypertension Father   . Hyperlipidemia Father     Past Surgical History:  Procedure Laterality Date  . BALLOON VALVULOPLASTY  12/24/2018   balloon mitral valvuloplasty at [facility]  . MITRAL VALVE REPLACEMENT Right 05/14/2019   Procedure: MINIMALLY INVASIVE MITRAL VALVE (MV) REPLACEMENT USING CARBOMEDICS OPTIFORM Size 42mm;  Surgeon: Rexene Alberts, MD;  Location: [place];  Service: Open Heart Surgery;  Laterality: Right;  . RIGHT/LEFT HEART CATH AND CORONARY ANGIOGRAPHY N/A 12/11/2018   Procedure: RIGHT/LEFT HEART CATH AND CORONARY ANGIOGRAPHY;  Surgeon: Lorretta Harp, MD;  Location: [place] CV LAB;  Service: Cardiovascular;  Laterality: N/A;  . TEE WITHOUT CARDIOVERSION N/A 12/11/2018   Procedure: TRANSESOPHAGEAL ECHOCARDIOGRAM (TEE);  Surgeon: Sanda Klein, MD;  Location: [place];  Service: Cardiovascular;  Laterality: N/A;  . TEE WITHOUT CARDIOVERSION N/A 03/27/2019   Procedure: TRANSESOPHAGEAL ECHOCARDIOGRAM (TEE);  Surgeon: Acie Fredrickson Wonda Cheng, MD;  Location: [place];  Service: Cardiovascular;  Laterality: N/A;  . TEE WITHOUT CARDIOVERSION N/A 05/14/2019   Procedure: TRANSESOPHAGEAL ECHOCARDIOGRAM (TEE);  Surgeon: Rexene Alberts,  MD;  Location: [place];  Service: Open Heart Surgery;  Laterality: N/A;  . TYMPANOSTOMY TUBE PLACEMENT     Social History   Occupational History  . Not on file  Tobacco Use  . Smoking status: Never Smoker  . Smokeless tobacco: Current User    Types: Snuff  Substance and Sexual Activity  . Alcohol use: No  . Drug use: No  . Sexual activity: Not on file

## 2019-11-15 LAB — POCT INR: INR: 2.3 (ref 2.0–3.0)

## 2019-11-16 ENCOUNTER — Ambulatory Visit (INDEPENDENT_AMBULATORY_CARE_PROVIDER_SITE_OTHER): Payer: BC Managed Care – PPO | Admitting: Cardiology

## 2019-11-16 DIAGNOSIS — Z954 Presence of other heart-valve replacement: Secondary | ICD-10-CM

## 2019-11-16 DIAGNOSIS — I050 Rheumatic mitral stenosis: Secondary | ICD-10-CM | POA: Diagnosis not present

## 2019-11-16 MED ORDER — WARFARIN SODIUM 2.5 MG PO TABS: ORAL_TABLET | ORAL | 0 refills | Status: DC

## 2019-11-17 ENCOUNTER — Encounter: Payer: Self-pay | Admitting: Cardiovascular Disease

## 2019-11-17 ENCOUNTER — Ambulatory Visit: Payer: BC Managed Care – PPO | Admitting: Cardiovascular Disease

## 2019-11-17 ENCOUNTER — Other Ambulatory Visit: Payer: Self-pay

## 2019-11-17 VITALS — BP 133/79 | HR 104 | Temp 97.2°F | Ht 74.0 in | Wt 218.0 lb

## 2019-11-17 DIAGNOSIS — I050 Rheumatic mitral stenosis: Secondary | ICD-10-CM | POA: Diagnosis not present

## 2019-11-17 DIAGNOSIS — I471 Supraventricular tachycardia: Secondary | ICD-10-CM

## 2019-11-17 NOTE — Progress Notes (Signed)
11/17/2019 Darren Haynes   1986-06-10  [id number]  [facility], [facility] Cardiologist: Runell Gess MD FACP, [place], [place], [place]  HPI:  Darren Haynes is a 34 y.o.  thinappearing married Caucasian male father 2 children who did work in Aeronautical engineer but has not worked in the last 9 months because of progressive dyspnea on exertion. He was referred by Dr. Hanley Hays for evaluation of symptomatic mitral stenosis. I last saw him in the office  09/01/2019.Thereis no history of rheumatic fever. He has no other cardiac risk factors. He is had progressive dyspnea for the last 9 months with some nonspecific aches and pains. Is being worked up by rheumatologist. Echo performed 11/21/2018 revealed normal LV systolic function, a peak mitral gradient of 47 mmHg with a mean of 29 mmHg and mitral valve area 1.5 cm with a PA pressure of 69 mmHg. He does have severe dyspnea and lower extreme edema. He is on oral diuretic.He presented today for transesophageal echocardiogram performed Dr. Royann Shivers which showed classic rheumatic mitral stenosis with a mean gradient of 15 mmHg.  I did right left heart cath revealing a significant mitral valve gradient,and normal coronary arteries.Ireferred him to Dr. Regino Schultze at [facility] who performed successful mitral balloon valvuloplasty with almost immediate improvement in his pulmonary artery pressures. 12/25/2018.Hefeltclinically improved.  Because of increasing symptoms of mitral stenosis I referred him to Dr.Owenwho ultimately performed mitral valve replacement with a Sorin [employer] bileaflet mechanical valve (29 mm) done minimally invasively on 05/14/2019.He has done well postoperatively except for volume overload controlled with oral diuretics and tachycardia.  I did obtain a 2D echo 06/23/2019 revealed normal LV size and function, no evidence of pericardial effusion with a well-functioning aortic mechanical prosthesis.  An event monitor performed 05/25/2019 showed sinus rhythm/sinus tachycardia with occasional PVCs.    He has seen Dr. Cornelius Moras in the office as well as how [facility].  His 2D echo did reveal normal LV size and function with a well-functioning aortic mechanical prosthesis and no evidence of pericardial effusion.  His palpitations have improved as well.  His dyspnea is markedly improved in addition on oral diuretics.  He underwent left total hip replacement tachycardia [facility] which has had some complications and infection.  He is walking with crutches.  Since I saw him 3 months ago his heart has remained stable.  He remains on warfarin oral anticoagulation.  Unfortunately his INR dipped down as low as 1.4 probably because of dietary indiscretion with regards to green leafy vegetables but he has since gotten up to 3.  He checks it at home.  He is getting physical therapy rehabilitation because of his hip.    Current Meds  Medication Sig  . ADVAIR HFA 115-21 MCG/ACT inhaler Inhale 1 puff into the lungs 2 (two) times daily.  [name] Kitchen albuterol (PROVENTIL) (2.5 MG/3ML) 0.083% nebulizer solution Take 2.5 mg by nebulization 4 (four) times daily as needed for wheezing or shortness of breath.   [name] Kitchen albuterol (VENTOLIN HFA) 108 (90 Base) MCG/ACT inhaler Inhale 1-2 puffs into the lungs 4 (four) times daily as needed for wheezing or shortness of breath.   [name] Kitchen aspirin EC 81 MG tablet Take 81 mg by mouth daily.  . benztropine (COGENTIN) 0.5 MG tablet Take 0.5 mg by mouth 2 (two) times daily.  . Buprenorphine HCl (BELBUCA) 750 MCG FILM Place 750 mcg inside cheek 2 (two) times daily.   . busPIRone (BUSPAR) 15 MG tablet Take 15 mg by  mouth 3 (three) times daily.  [name] Kitchen ceFAZolin (ANCEF) 2-4 GM/100ML-% IVPB Inject into the vein.  . celecoxib (CELEBREX) 200 MG capsule Take 200 mg by mouth daily.   . Cholecalciferol (VITAMIN D-1000 MAX ST) 25 MCG (1000 UT) tablet Take 1,000 Units by mouth once a week.   . clonazePAM  (KLONOPIN) 0.5 MG tablet Take 0.5 mg by mouth at bedtime.  [name] Kitchen Desvenlafaxine Succinate ER 25 MG TB24 Take 25 mg by mouth 2 (two) times daily.  . famotidine (PEPCID) 40 MG tablet Take 40 mg by mouth daily.  . folic acid (FOLVITE) 1 MG tablet Take 1 mg by mouth daily.  . furosemide (LASIX) 40 MG tablet Take 1 tablet (40 mg total) by mouth daily. You may take an extra 20mg  as needed for volume overload.  . gabapentin (NEURONTIN) 300 MG capsule Take 600 mg by mouth 3 (three) times daily.   leflunomide (ARAVA) 20 MG tablet Take 20 mg by mouth daily.  . metoprolol succinate (TOPROL-XL) 25 MG 24 hr tablet Take 1 tablet (25 mg total) by mouth 2 (two) times daily.  . nortriptyline (PAMELOR) 25 MG capsule Take 25 mg by mouth at bedtime.   . Oxycodone HCl 10 MG TABS Take 10 mg by mouth every 4 (four) hours as needed (pain).   [name] Kitchen penicillin v potassium (VEETID) 250 MG tablet Take 250 mg by mouth 2 (two) times a day.  . potassium chloride SA (KLOR-CON) 20 MEQ tablet Take 20 mEq by mouth daily.  . promethazine-dextromethorphan (PROMETHAZINE-DM) 6.25-15 MG/5ML syrup Take 5 mLs by mouth 4 (four) times daily as needed. (Patient taking differently: Take 5 mLs by mouth 4 (four) times daily as needed for cough. )  . risperiDONE (RISPERDAL) 0.5 MG tablet Take 0.5 mg by mouth at bedtime.  01-14-1989 tiZANidine (ZANAFLEX) 4 MG tablet Take 4 mg by mouth 3 (three) times daily.  [name] Kitchen warfarin (COUMADIN) 2.5 MG tablet Take 1 to 1.5 tablets by mouth daily as directed.   Current Facility-Administered Medications for the 11/17/19 encounter (Office Visit) with 01/15/20, MD  Medication  . metoprolol tartrate (LOPRESSOR) tablet 25 mg     No Known Allergies  Social History   Socioeconomic History  . Marital status: Married    Spouse name: Not on file  . Number of children: Not on file  . Years of education: Not on file  . Highest education level: Not on file  Occupational History  . Not on file  Tobacco Use  . Smoking  status: Never Smoker  . Smokeless tobacco: Current User    Types: Snuff  Substance and Sexual Activity  . Alcohol use: No  . Drug use: No  . Sexual activity: Not on file  Other Topics Concern  . Not on file  Social History Narrative  . Not on file   Social Determinants of Health   Financial Resource Strain:   . Difficulty of Paying Living Expenses: Not on file  Food Insecurity:   . Worried About Runell Gess in the Last Year: Not on file  . Ran Out of Food in the Last Year: Not on file  Transportation Needs:   . Lack of Transportation (Medical): Not on file  . Lack of Transportation (Non-Medical): Not on file  Physical Activity:   . Days of Exercise per Week: Not on file  . Minutes of Exercise per Session: Not on file  Stress:   . Feeling of Stress : Not on file  Social  Connections:   . Frequency of Communication with Friends and Family: Not on file  . Frequency of Social Gatherings with Friends and Family: Not on file  . Attends Religious Services: Not on file  . Active Member of Clubs or Organizations: Not on file  . Attends Archivist Meetings: Not on file  . Marital Status: Not on file  Intimate Partner Violence:   . Fear of Current or Ex-Partner: Not on file  . Emotionally Abused: Not on file  . Physically Abused: Not on file  . Sexually Abused: Not on file     Review of Systems: General: negative for chills, fever, night sweats or weight changes.  Cardiovascular: negative for chest pain, dyspnea on exertion, edema, orthopnea, palpitations, paroxysmal nocturnal dyspnea or shortness of breath Dermatological: negative for rash Respiratory: negative for cough or wheezing Urologic: negative for hematuria Abdominal: negative for nausea, vomiting, diarrhea, bright red blood per rectum, melena, or hematemesis Neurologic: negative for visual changes, syncope, or dizziness All other systems reviewed and are otherwise negative except as noted  above.    Blood pressure 133/79, pulse (!) 104, temperature (!) 97.2 F (36.2 C), height 6\' 2"  (1.88 m), weight 218 lb (98.9 kg), SpO2 100 %.  General appearance: alert and no distress Neck: no adenopathy, no carotid bruit, no JVD, supple, symmetrical, trachea midline and thyroid not enlarged, symmetric, no tenderness/mass/nodules Lungs: clear to auscultation bilaterally Heart: Crisp mechanical valve sounds Extremities: extremities normal, atraumatic, no cyanosis or edema Pulses: 2+ and symmetric Skin: Skin color, texture, turgor normal. No rashes or lesions Neurologic: Alert and oriented X 3, normal strength and tone. Normal symmetric reflexes. Normal coordination and gait  EKG not performed today  ASSESSMENT AND PLAN:   Mitral stenosis History of presumably rheumatic mitral stenosis status post right left heart cath revealing normal coronary arteries and a significant mitral valve gradient.  He underwent balloon mitral valvuloplasty by Dr. Mina Marble 12/25/2018 with clinical improvement but ultimate Truman Hayward he had high deterioration requiring mitral valve replacement by Dr. Roxy Manns 05/14/2019 with a Sorin [employer] bileaflet mechanical valve (29 mm).  He is clinically improved.  He is on warfarin oral anticoagulation with an INR in the 3 range.  He is very sensitive to green leafy vegetables.  PSVT (paroxysmal supraventricular tachycardia) ([place]) History of PSVT in the past however this is no longer a clinical problem      Lorretta Harp MD [facility], [facility] 11/17/2019 12:38 PM

## 2019-11-17 NOTE — Assessment & Plan Note (Signed)
History of presumably rheumatic mitral stenosis status post right left heart cath revealing normal coronary arteries and a significant mitral valve gradient.  He underwent balloon mitral valvuloplasty by Dr. Regino Schultze 12/25/2018 with clinical improvement but ultimate Nedra Hai he had high deterioration requiring mitral valve replacement by Dr. Cornelius Moras 05/14/2019 with a Sorin [employer] bileaflet mechanical valve (29 mm).  He is clinically improved.  He is on warfarin oral anticoagulation with an INR in the 3 range.  He is very sensitive to green leafy vegetables.

## 2019-11-17 NOTE — Patient Instructions (Signed)
Medication Instructions:  Your physician recommends that you continue on your current medications as directed. Please refer to the Current Medication list given to you today.  If you need a refill on your cardiac medications before your next appointment, please call your pharmacy.   Lab work: NONE  Testing/Procedures: Your physician has requested that you have an echocardiogram in August. Echocardiography is a painless test that uses sound waves to create images of your heart. It provides your doctor with information about the size and shape of your heart and how well your heart's chambers and valves are working. This procedure takes approximately one hour. There are no restrictions for this procedure. [address]   Follow-Up: At [facility], you and your health needs are our priority.  As part of our continuing mission to provide you with exceptional heart care, we have created designated Provider Care Teams.  These Care Teams include your primary Cardiologist (physician) and Advanced Practice Providers (APPs -  Physician Assistants and Nurse Practitioners) who all work together to provide you with the care you need, when you need it. You may see Nanetta Batty, MD or one of the following Advanced Practice Providers on your designated Care Team:    Corine Shelter, PA-C  [place], [place]  Edd Fabian, [place]  Your physician wants you to follow-up in: 6 months with Dr Allyson Sabal

## 2019-11-17 NOTE — Assessment & Plan Note (Signed)
History of PSVT in the past however this is no longer a clinical problem

## 2019-11-23 ENCOUNTER — Ambulatory Visit: Payer: BC Managed Care – PPO | Admitting: Orthopaedic Surgery

## 2019-11-25 LAB — POCT INR: INR: 2.4 (ref 2.0–3.0)

## 2019-11-26 ENCOUNTER — Ambulatory Visit (INDEPENDENT_AMBULATORY_CARE_PROVIDER_SITE_OTHER): Payer: BC Managed Care – PPO | Admitting: Cardiology

## 2019-11-26 DIAGNOSIS — I050 Rheumatic mitral stenosis: Secondary | ICD-10-CM

## 2019-11-26 DIAGNOSIS — Z954 Presence of other heart-valve replacement: Secondary | ICD-10-CM | POA: Diagnosis not present

## 2019-11-30 ENCOUNTER — Emergency Department (HOSPITAL_COMMUNITY)
Admission: EM | Admit: 2019-11-30 | Discharge: 2019-11-30 | Disposition: A | Discharge status: Home / Self Care | Payer: BC Managed Care – PPO | Attending: Emergency Medicine | Admitting: Emergency Medicine

## 2019-11-30 ENCOUNTER — Other Ambulatory Visit: Payer: Self-pay

## 2019-11-30 ENCOUNTER — Encounter (HOSPITAL_COMMUNITY): Payer: Self-pay | Admitting: Emergency Medicine

## 2019-11-30 DIAGNOSIS — T82898A Other specified complication of vascular prosthetic devices, implants and grafts, initial encounter: Secondary | ICD-10-CM | POA: Insufficient documentation

## 2019-11-30 DIAGNOSIS — Z5321 Procedure and treatment not carried out due to patient leaving prior to being seen by health care provider: Secondary | ICD-10-CM | POA: Insufficient documentation

## 2019-11-30 DIAGNOSIS — Y712 Prosthetic and other implants, materials and accessory cardiovascular devices associated with adverse incidents: Secondary | ICD-10-CM | POA: Diagnosis not present

## 2019-11-30 NOTE — ED Notes (Addendum)
Pt was seen at [facility] and told that he needed is PICC line replaced; pt discharged and told to come back in the morning to be seen when someone was able to replace it; "was told 40 people were ahead of me so I came here." While triaging in the lobby (obtaining VS, history, and allergies) pt verbalizes does not want to stay if there is a wait; thus, triage not completed. Pt informed risks of leaving without getting appropriately screened by a physician. Delma Post RN triaging with this RN and witnesses pt leaving [facility].

## 2019-12-01 LAB — POCT INR: INR: 1.3 — AB (ref 2.0–3.0)

## 2019-12-02 ENCOUNTER — Ambulatory Visit (INDEPENDENT_AMBULATORY_CARE_PROVIDER_SITE_OTHER): Payer: BC Managed Care – PPO | Admitting: Pharmacist Clinician (PhC)/ Clinical Pharmacy Specialist

## 2019-12-02 DIAGNOSIS — Z954 Presence of other heart-valve replacement: Secondary | ICD-10-CM

## 2019-12-02 DIAGNOSIS — Z7901 Long term (current) use of anticoagulants: Secondary | ICD-10-CM | POA: Diagnosis not present

## 2019-12-02 DIAGNOSIS — I050 Rheumatic mitral stenosis: Secondary | ICD-10-CM

## 2019-12-09 LAB — POCT INR: INR: 2.1 (ref 2.0–3.0)

## 2019-12-10 ENCOUNTER — Ambulatory Visit (INDEPENDENT_AMBULATORY_CARE_PROVIDER_SITE_OTHER): Payer: BC Managed Care – PPO | Admitting: Pharmacist

## 2019-12-10 DIAGNOSIS — Z7901 Long term (current) use of anticoagulants: Secondary | ICD-10-CM

## 2019-12-10 DIAGNOSIS — I050 Rheumatic mitral stenosis: Secondary | ICD-10-CM

## 2019-12-10 DIAGNOSIS — Z954 Presence of other heart-valve replacement: Secondary | ICD-10-CM

## 2019-12-14 ENCOUNTER — Ambulatory Visit (INDEPENDENT_AMBULATORY_CARE_PROVIDER_SITE_OTHER): Payer: BC Managed Care – PPO | Admitting: Cardiology

## 2019-12-14 ENCOUNTER — Other Ambulatory Visit: Payer: Self-pay

## 2019-12-14 ENCOUNTER — Other Ambulatory Visit: Payer: Self-pay | Admitting: Pharmacist Clinician (PhC)/ Clinical Pharmacy Specialist

## 2019-12-14 DIAGNOSIS — Z954 Presence of other heart-valve replacement: Secondary | ICD-10-CM

## 2019-12-14 DIAGNOSIS — I050 Rheumatic mitral stenosis: Secondary | ICD-10-CM | POA: Diagnosis not present

## 2019-12-14 LAB — POCT INR: INR: 2.2 (ref 2.0–3.0)

## 2019-12-14 MED ORDER — WARFARIN SODIUM 5 MG PO TABS: 5.0000 mg | ORAL_TABLET | Freq: Every day | ORAL | 0 refills | Status: DC

## 2019-12-17 ENCOUNTER — Encounter: Payer: Self-pay | Admitting: Pulmonary Disease

## 2019-12-19 LAB — POCT INR: INR: 5 — AB (ref 2.0–3.0)

## 2019-12-21 ENCOUNTER — Ambulatory Visit (INDEPENDENT_AMBULATORY_CARE_PROVIDER_SITE_OTHER): Payer: BC Managed Care – PPO | Admitting: Pharmacist

## 2019-12-21 DIAGNOSIS — I050 Rheumatic mitral stenosis: Secondary | ICD-10-CM | POA: Diagnosis not present

## 2019-12-21 DIAGNOSIS — Z954 Presence of other heart-valve replacement: Secondary | ICD-10-CM

## 2019-12-30 ENCOUNTER — Institutional Professional Consult (permissible substitution): Payer: BC Managed Care – PPO | Admitting: Internal Medicine

## 2020-01-04 ENCOUNTER — Institutional Professional Consult (permissible substitution): Payer: BC Managed Care – PPO | Admitting: Pulmonary Disease

## 2020-01-04 LAB — POCT INR: INR: 2 (ref 2.0–3.0)

## 2020-01-05 ENCOUNTER — Encounter: Payer: Self-pay | Admitting: Cardiovascular Disease

## 2020-01-05 ENCOUNTER — Ambulatory Visit (INDEPENDENT_AMBULATORY_CARE_PROVIDER_SITE_OTHER): Payer: BC Managed Care – PPO | Admitting: Pharmacist Clinician (PhC)/ Clinical Pharmacy Specialist

## 2020-01-05 DIAGNOSIS — Z954 Presence of other heart-valve replacement: Secondary | ICD-10-CM

## 2020-01-05 DIAGNOSIS — Z7901 Long term (current) use of anticoagulants: Secondary | ICD-10-CM

## 2020-01-05 DIAGNOSIS — I050 Rheumatic mitral stenosis: Secondary | ICD-10-CM

## 2020-01-05 NOTE — Telephone Encounter (Signed)
error 

## 2020-01-08 ENCOUNTER — Other Ambulatory Visit: Payer: Self-pay

## 2020-01-08 ENCOUNTER — Encounter: Payer: Self-pay | Admitting: Physician Assistant

## 2020-01-08 ENCOUNTER — Ambulatory Visit (INDEPENDENT_AMBULATORY_CARE_PROVIDER_SITE_OTHER): Payer: BC Managed Care – PPO | Admitting: Physician Assistant

## 2020-01-08 VITALS — BP 124/76 | HR 99 | Ht 74.0 in | Wt 222.4 lb

## 2020-01-08 DIAGNOSIS — Z954 Presence of other heart-valve replacement: Secondary | ICD-10-CM

## 2020-01-08 DIAGNOSIS — R0600 Dyspnea, unspecified: Secondary | ICD-10-CM | POA: Diagnosis not present

## 2020-01-08 DIAGNOSIS — E871 Hypo-osmolality and hyponatremia: Secondary | ICD-10-CM

## 2020-01-08 DIAGNOSIS — I5032 Chronic diastolic (congestive) heart failure: Secondary | ICD-10-CM

## 2020-01-08 NOTE — Progress Notes (Signed)
Cardiology Office Note:    Date:  01/10/2020   ID:  RISHAWN WALCK, DOB 11-Dec-1985, MRN [id number]  PCP:  [facility]  Cardiologist:  Nanetta Batty, MD  Electrophysiologist:  None   Referring MD: [facility]   Chief Complaint  Patient presents with  . Follow-up    seen for Dr. Allyson Sabal.    History of Present Illness:    Darren Haynes is a 34 y.o. 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  He was first seen by Dr. Allyson Sabal in January 2020, echocardiogram performed at the time demonstrated normal LV function, peak mitral gradient of 47 mmHg, with mean gradient of 29 mmHg.  Mitral valve area 1.5 cm.  He had severe dyspnea and lower extremity edema.  TEE demonstrated classic rheumatic mitral valve stenosis with mean gradient of 15 mmHg.  Left and right heart cath revealed significant mitral valve gradient, normal coronaries.  He was referred to Dr. Modesto Charon at [facility] who performed a successful mitral balloon valvuloplasty in February 2020 with immediate improvement in his pulmonary artery pressure and symptom for the next 2 months.  However he returned in April with progressive dyspnea and lower extremity edema.  TEE obtained on 03/27/2019 demonstrated moderate to severe MS.  Echocardiogram revealed mild LV dysfunction with EF of 45 to 50%.  Event monitor showed PACs, PVCs and episode of PSVT and NSVT. Due to progressive worsening symptom of mitral stenosis, he eventually underwent mitral valve replacement with a Sorin [employer] bileaflet mechanical valve 29 mm performed on 05/14/2019.  Postop course complicated by volume overload and postop A. fib which he converted to sinus rhythm on IV amiodarone therapy. On postop day #2, he had an episode of complete heart block.  Lopressor PRN was discontinued.  He was discharged on 30-day event monitor.   Amiodarone therapy was also discontinued as well.  Echocardiogram obtained on 06/23/2019 revealed a normal LV function, no evidence of pericardial effusion with well functioning aortic mechanical prosthesis. He was started on low-dose beta-blocker due to tachycardia.  He was seen by neurology at [facility] in August for diplopia, based on his clinical symptom, image finding and CSF result, it is very unlikely this patient has multiple sclerosis.  He was referred to ophthalmology service. He has also been seen by [facility] for bilateral hip pain. 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  He was deemed not a candidate for free vascularized fibular graft. He eventually underwent left total hip replacement at [facility].  Postop course comp complicated by bacteremia requiring placement of PICC line and IV antibiotic.  Patient was most recently seen by Dr. Allyson Sabal on 11/17/2019 at which time he was doing well.  Dr. Allyson Sabal recommended a repeat echocardiogram in August.   Since last office visit, he went to [facility] ED on 11/29/2019 for chest pain.  Chest CT was negative for PE but demonstrated right upper lobe groundglass opacity with patchy nodular opacities in the left lower lobe, suspicious for multifocal infection.  CBC shows no evidence of leukocytosis.  Lactate was normal.  Point-of-care Covid test was negative.  Cardiac enzyme was negative.  Based on the ED physicians report, they do not suspect acute pulmonary infection or any infection stemming from his PICC line.  Right upper extremity ultrasound demonstrated superficial thrombosis of the right basilic vein at the site of PICC line.  Patient presents today for cardiology office visit.  His PICC line was recently removed.  He denies any fever, chills or cough.  For the past month, he has been noticing worsening dyspnea on exertion.  He also noticed occasional lower extremity edema by the end of the day, however I do not see any edema on  physical exam.  He is lung is clear from the base to the top.  Despite recent CT of the chest showing a pasty on the left side of the lung, I did not pick up any decreased breath sound, wheezing, crackles or rhonchi on exam.  He appears to be euvolemic based on my exam.  EKG showed sinus rhythm with heart rate 99.  He has been compliant with beta-blocker therapy.  He says his primary care provider has recently obtained some lab work that showed hyponatremia, last lab work I can see in the epic system dated back to January 25 done at [facility] that showed a normal sodium level.  We will obtain records from [facility]  Otherwise, given worsening dyspnea on exertion, I recommended early repeat echocardiogram.  Since he is not volume overloaded on exam, I do not recommend increase diuretic.  I plan to bring the patient back in 1 month for reassessment.  Past Medical History:  Diagnosis Date  . Arthritis   . Avascular necrosis (HCC)   . CHF (congestive heart failure) (HCC)   . Chronic back pain   . Depression   . Dyspnea   . GERD (gastroesophageal reflux disease)   . Heart murmur   . Mitral valve stenosis   . Panic attacks   . Pneumonia    hx 4 time  . Pulmonary hypertension (HCC)   . Rheumatic fever   . 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 BALLOON VALVULOPLASTY  12/24/2018   balloon mitral valvuloplasty at [facility]  . MITRAL VALVE REPLACEMENT Right 05/14/2019   Procedure: MINIMALLY INVASIVE MITRAL VALVE (MV) REPLACEMENT USING CARBOMEDICS OPTIFORM Size 97mm;  Surgeon: Purcell Nails, MD;  Location: [facility] OR;  Service: Open Heart Surgery;  Laterality: Right;  . RIGHT/LEFT HEART CATH AND CORONARY ANGIOGRAPHY N/A 12/11/2018   Procedure: RIGHT/LEFT HEART CATH AND CORONARY ANGIOGRAPHY;  Surgeon: Runell Gess,  MD;  Location: [facility] INVASIVE CV LAB;  Service: Cardiovascular;  Laterality: N/A;  . TEE WITHOUT CARDIOVERSION N/A 12/11/2018   Procedure: TRANSESOPHAGEAL ECHOCARDIOGRAM (TEE);  Surgeon: Thurmon Fair, MD;  Location: [facility] ENDOSCOPY;  Service: Cardiovascular;  Laterality: N/A;  . TEE WITHOUT CARDIOVERSION N/A 03/27/2019   Procedure: TRANSESOPHAGEAL ECHOCARDIOGRAM (TEE);  Surgeon: Elease Hashimoto Deloris Ping, MD;  Location: [facility] ENDOSCOPY;  Service: Cardiovascular;  Laterality: N/A;  . TEE WITHOUT CARDIOVERSION N/A 05/14/2019   Procedure: TRANSESOPHAGEAL ECHOCARDIOGRAM (TEE);  Surgeon: Purcell Nails, MD;  Location[facility] OR;  Service: Open Heart Surgery;  Laterality: N/A;  . TYMPANOSTOMY TUBE PLACEMENT      Current Medications: Current Meds  Medication Sig  . ADVAIR HFA 115-21 MCG/ACT inhaler Inhale 1 puff into the lungs 2 (two) times daily.  [name] Kitchen albuterol (PROVENTIL) (2.5 MG/3ML) 0.083% nebulizer solution Take 2.5 mg by nebulization 4 (four) times daily as needed for wheezing or shortness of breath.   [name] Kitchen albuterol (VENTOLIN HFA) 108 (90 Base) MCG/ACT inhaler Inhale 1-2 puffs into the lungs 4 (four) times daily as needed for  wheezing or shortness of breath.   . Apremilast (OTEZLA) 30 MG TABS Take 30 mg by mouth in the morning and at bedtime.  [name] Kitchen aspirin EC 81 MG tablet Take 81 mg by mouth daily.  . Buprenorphine HCl (BELBUCA) 750 MCG FILM Place 750 mcg inside cheek 2 (two) times daily.   . busPIRone (BUSPAR) 15 MG tablet Take 15 mg by mouth 3 (three) times daily.  . celecoxib (CELEBREX) 200 MG capsule Take 200 mg by mouth daily.   . Cholecalciferol (VITAMIN D-1000 MAX ST) 25 MCG (1000 UT) tablet Take 1,000 Units by mouth once a week.   . clonazePAM (KLONOPIN) 0.5 MG tablet Take 0.5 mg by mouth at bedtime.  [name] Kitchen Desvenlafaxine Succinate ER 25 MG TB24 Take 25 mg by mouth 2 (two) times daily.  . famotidine (PEPCID) 40 MG tablet Take 40 mg by mouth daily.  . folic acid (FOLVITE) 1 MG tablet Take 1 mg by mouth daily.  .  furosemide (LASIX) 40 MG tablet Take 1 tablet (40 mg total) by mouth daily. You may take an extra 20mg  as needed for volume overload.  . gabapentin (NEURONTIN) 300 MG capsule Take 600 mg by mouth 3 (three) times daily.   leflunomide (ARAVA) 20 MG tablet Take 20 mg by mouth daily.  . metoprolol succinate (TOPROL-XL) 25 MG 24 hr tablet Take 1 tablet (25 mg total) by mouth 2 (two) times daily.  . nortriptyline (PAMELOR) 25 MG capsule Take 25 mg by mouth at bedtime.   . Oxycodone HCl 10 MG TABS Take 10 mg by mouth every 4 (four) hours as needed (pain).   [name] Kitchen penicillin v potassium (VEETID) 250 MG tablet Take 250 mg by mouth 2 (two) times a day.  . potassium chloride SA (KLOR-CON) 20 MEQ tablet Take 20 mEq by mouth daily.  . pravastatin (PRAVACHOL) 20 MG tablet Take 20 mg by mouth daily.  . promethazine-dextromethorphan (PROMETHAZINE-DM) 6.25-15 MG/5ML syrup Take 5 mLs by mouth 4 (four) times daily as needed. (Patient taking differently: Take 5 mLs by mouth 4 (four) times daily as needed for cough. )  . risperiDONE (RISPERDAL) 0.5 MG tablet Take 0.5 mg by mouth at bedtime.  01-14-1989 tiZANidine (ZANAFLEX) 4 MG tablet Take 4 mg by mouth 3 (three) times daily.  [name] Kitchen warfarin (COUMADIN) 5 MG tablet Take 1 tablet (5 mg total) by mouth daily.   Current Facility-Administered Medications for the 01/08/20 encounter (Office Visit) with 01/10/20, PA  Medication  . metoprolol tartrate (LOPRESSOR) tablet 25 mg     Allergies:   Patient has no known allergies.   Social History   Socioeconomic History  . Marital status: Married    Spouse name: Not on file  . Number of children: Not on file  . Years of education: Not on file  . Highest education level: Not on file  Occupational History  . Not on file  Tobacco Use  . Smoking status: Never Smoker  . Smokeless tobacco: Current User    Types: Snuff  Substance and Sexual Activity  . Alcohol use: No  . Drug use: No  . Sexual activity: Not on file  Other Topics  Concern  . Not on file  Social History Narrative  . Not on file   Social Determinants of Health   Financial Resource Strain:   . Difficulty of Paying Living Expenses: Not on file  Food Insecurity:   . Worried About Azalee Course in the Last Year: Not on file  .  Ran Out of Food in the Last Year: Not on file  Transportation Needs:   . Lack of Transportation (Medical): Not on file  . Lack of Transportation (Non-Medical): Not on file  Physical Activity:   . Days of Exercise per Week: Not on file  . Minutes of Exercise per Session: Not on file  Stress:   . Feeling of Stress : Not on file  Social Connections:   . Frequency of Communication with Friends and Family: Not on file  . Frequency of Social Gatherings with Friends and Family: Not on file  . Attends Religious Services: Not on file  . Active Member of Clubs or Organizations: Not on file  . Attends Banker Meetings: Not on file  . Marital Status: Not on file     Family History: The patient's family history includes Depression in his mother; Heart disease in his mother; Hyperlipidemia in his father; Hypertension in his father.  ROS:   Please see the history of present illness.     All other systems reviewed and are negative.  EKGs/Labs/Other Studies Reviewed:    The following studies were reviewed today:  Echo 06/23/2019 1. The left ventricle has low normal systolic function, with an ejection  fraction of 50-55%. The cavity size was normal. Left ventricular diastolic  function could not be evaluated due to indeterminate diastolic function.  No evidence of left ventricular  regional wall motion abnormalities.  2. The right ventricle has normal systolic function. The cavity was  mildly enlarged. There is no increase in right ventricular wall thickness.  Right ventricular systolic pressure is normal with an estimated pressure  of 26.2 mmHg.  3. Left atrial size was mildly dilated.  4. S/P 68mm Sorin  mechanical MVR which appears to be functioning  normally. There is no perivalvular MR. The mean MVG is .  5. The aorta is normal in size and structure.   EKG:  EKG is ordered today.  The ekg ordered today demonstrates normal sinus rhythm without significant ST-T wave changes  Recent Labs: 07/05/2019: Magnesium 1.9 08/16/2019: ALT 20 10/31/2019: B Natriuretic Peptide 26.8; BUN 12; Creatinine, Ser 0.97; Hemoglobin 13.1; Platelets 173; Potassium 2.8; Sodium 137  Recent Lipid Panel No results found for: CHOL, TRIG, HDL, CHOLHDL, VLDL, LDLCALC, LDLDIRECT  Physical Exam:    VS:  BP 124/76   Pulse 99   Ht 6\' 2"  (1.88 m)   Wt 222 lb 6.4 oz (100.9 kg)   BMI 28.55 kg/m     Wt Readings from Last 3 Encounters:  01/08/20 222 lb 6.4 oz (100.9 kg)  11/17/19 218 lb (98.9 kg)  10/31/19 (S) 217 lb 13 oz (98.8 kg)     GEN:  Well nourished, well developed in no acute distress HEENT: Normal NECK: No JVD; No carotid bruits LYMPHATICS: No lymphadenopathy CARDIAC: RRR, no murmurs, rubs, gallops RESPIRATORY:  Clear to auscultation without rales, wheezing or rhonchi  ABDOMEN: Soft, non-tender, non-distended MUSCULOSKELETAL:  No edema; No deformity  SKIN: Warm and dry NEUROLOGIC:  Alert and oriented x 3 PSYCHIATRIC:  Normal affect   ASSESSMENT:    1. Dyspnea, unspecified type   2. S/P mitral valve replacement with metallic valve   3. Pulmonary hypertension (HCC)   4. Chronic diastolic heart failure (HCC)   5. Hyponatremia    PLAN:    In order of problems listed above:  1. Dyspnea: Symptom has been worsening in the past month.  On physical exam, he has a crisp mitral valve  click without significant murmur.  However given the recent dyspnea, I recommended a echocardiogram.  He has had recent lab work at [facility], we will request those lab work.  Although previously, he had anemia, however last hemoglobin obtained at [facility] on 12/07/2019 showed hemoglobin has improved to  12.0.  On physical exam, he appears to be euvolemic.  Therefore if echocardiogram is normal, I would not recommend any further work-up.  Note, patient has a history of pulmonary hypertension, will follow-up on PA peak pressure on the echocardiogram.  2. History of mitral valve replacement: History of mechanical mitral valve replacement.  On Coumadin therapy.  Last INR 2.0 on 01/04/2020  3. Chronic diastolic heart failure: Euvolemic on physical exam  4. Hyponatremia: I did not see any hyponatremia based on lab work at [facility] however according to the patient, he has since been diagnosed with hyponatremia based on lab work obtained at [facility], we will request the lab work.   Medication Adjustments/Labs and Tests Ordered: Current medicines are reviewed at length with the patient today.  Concerns regarding medicines are outlined above.  No orders of the defined types were placed in this encounter.  No orders of the defined types were placed in this encounter.   Patient Instructions  Medication Instructions:  Your physician recommends that you continue on your current medications as directed. Please refer to the Current Medication list given to you today.  *If you need a refill on your cardiac medications before your next appointment, please call your pharmacy*  Testing/Procedures: Echocardiogram to be scheduled within 2 weeks   Follow-Up: At [facility], you and your health needs are our priority.  As part of our continuing mission to provide you with exceptional heart care, we have created designated Provider Care Teams.  These Care Teams include your primary Cardiologist (physician) and Advanced Practice Providers (APPs -  Physician Assistants and Nurse Practitioners) who all work together to provide you with the care you need, when you need it.  We recommend signing up for the patient portal called "MyChart".  Sign up information is provided on this After Visit Summary.   MyChart is used to connect with patients for Virtual Visits (Telemedicine).  Patients are able to view lab/test results, encounter notes, upcoming appointments, etc.  Non-urgent messages can be sent to your provider as well.   To learn more about what you can do with MyChart, go to [email or web address].    Your next appointment:   4 week(s)  The format for your next appointment:   In Person  Provider:   Almyra Deforest PA  Other Instructions      Signed, Almyra Deforest, [place]  01/10/2020 11:29 PM    [place]

## 2020-01-08 NOTE — Patient Instructions (Signed)
Medication Instructions:  Your physician recommends that you continue on your current medications as directed. Please refer to the Current Medication list given to you today.  *If you need a refill on your cardiac medications before your next appointment, please call your pharmacy*  Testing/Procedures: Echocardiogram to be scheduled within 2 weeks   Follow-Up: At [facility], you and your health needs are our priority.  As part of our continuing mission to provide you with exceptional heart care, we have created designated Provider Care Teams.  These Care Teams include your primary Cardiologist (physician) and Advanced Practice Providers (APPs -  Physician Assistants and Nurse Practitioners) who all work together to provide you with the care you need, when you need it.  We recommend signing up for the patient portal called "MyChart".  Sign up information is provided on this After Visit Summary.  MyChart is used to connect with patients for Virtual Visits (Telemedicine).  Patients are able to view lab/test results, encounter notes, upcoming appointments, etc.  Non-urgent messages can be sent to your provider as well.   To learn more about what you can do with MyChart, go to [email or web address].    Your next appointment:   4 week(s)  The format for your next appointment:   In Person  Provider:   Azalee Course PA  Other Instructions

## 2020-01-10 ENCOUNTER — Encounter: Payer: Self-pay | Admitting: Physician Assistant

## 2020-01-13 ENCOUNTER — Ambulatory Visit (INDEPENDENT_AMBULATORY_CARE_PROVIDER_SITE_OTHER): Payer: BC Managed Care – PPO | Admitting: Internal Medicine

## 2020-01-13 DIAGNOSIS — Z954 Presence of other heart-valve replacement: Secondary | ICD-10-CM | POA: Diagnosis not present

## 2020-01-13 DIAGNOSIS — I050 Rheumatic mitral stenosis: Secondary | ICD-10-CM

## 2020-01-13 LAB — POCT INR: INR: 4.8 — AB (ref 2.0–3.0)

## 2020-01-14 ENCOUNTER — Other Ambulatory Visit: Payer: Self-pay

## 2020-01-14 ENCOUNTER — Ambulatory Visit (HOSPITAL_COMMUNITY)
Admission: RE | Admit: 2020-01-14 | Discharge: 2020-01-14 | Disposition: A | Discharge status: Home / Self Care | Payer: BC Managed Care – PPO | Source: Ambulatory Visit | Attending: Cardiovascular Disease | Admitting: Cardiovascular Disease

## 2020-01-14 ENCOUNTER — Other Ambulatory Visit: Payer: Self-pay | Admitting: Cardiovascular Disease

## 2020-01-14 DIAGNOSIS — Z954 Presence of other heart-valve replacement: Secondary | ICD-10-CM

## 2020-01-14 DIAGNOSIS — I050 Rheumatic mitral stenosis: Secondary | ICD-10-CM | POA: Insufficient documentation

## 2020-01-14 NOTE — Progress Notes (Signed)
*  PRELIMINARY RESULTS* Echocardiogram 2D Echocardiogram has been performed.  Stacey Drain 01/14/2020, 11:52 AM

## 2020-01-15 ENCOUNTER — Other Ambulatory Visit (INDEPENDENT_AMBULATORY_CARE_PROVIDER_SITE_OTHER): Payer: BC Managed Care – PPO

## 2020-01-15 DIAGNOSIS — R002 Palpitations: Secondary | ICD-10-CM | POA: Diagnosis not present

## 2020-01-18 ENCOUNTER — Ambulatory Visit (INDEPENDENT_AMBULATORY_CARE_PROVIDER_SITE_OTHER): Payer: BC Managed Care – PPO | Admitting: Cardiology

## 2020-01-18 DIAGNOSIS — Z954 Presence of other heart-valve replacement: Secondary | ICD-10-CM

## 2020-01-18 DIAGNOSIS — I050 Rheumatic mitral stenosis: Secondary | ICD-10-CM

## 2020-01-18 LAB — POCT INR: INR: 3.5 — AB (ref 2.0–3.0)

## 2020-01-26 LAB — POCT INR: INR: 1.8 — AB (ref 2.0–3.0)

## 2020-01-27 ENCOUNTER — Ambulatory Visit (INDEPENDENT_AMBULATORY_CARE_PROVIDER_SITE_OTHER): Payer: BC Managed Care – PPO | Admitting: Cardiovascular Disease

## 2020-01-27 DIAGNOSIS — I050 Rheumatic mitral stenosis: Secondary | ICD-10-CM | POA: Diagnosis not present

## 2020-01-27 DIAGNOSIS — Z954 Presence of other heart-valve replacement: Secondary | ICD-10-CM | POA: Diagnosis not present

## 2020-02-05 ENCOUNTER — Ambulatory Visit: Payer: BC Managed Care – PPO | Admitting: Physician Assistant

## 2020-02-15 ENCOUNTER — Other Ambulatory Visit (HOSPITAL_COMMUNITY): Payer: BC Managed Care – PPO

## 2020-02-18 ENCOUNTER — Ambulatory Visit (INDEPENDENT_AMBULATORY_CARE_PROVIDER_SITE_OTHER): Payer: BC Managed Care – PPO | Admitting: Cardiovascular Disease

## 2020-02-18 DIAGNOSIS — Z954 Presence of other heart-valve replacement: Secondary | ICD-10-CM | POA: Diagnosis not present

## 2020-02-18 DIAGNOSIS — I050 Rheumatic mitral stenosis: Secondary | ICD-10-CM

## 2020-02-18 LAB — POCT INR: INR: 3.6 — AB (ref 2.0–3.0)

## 2020-02-18 NOTE — Patient Instructions (Signed)
April 27: Last dose of Coumadin.  April 28: No Coumadin or Lovenox.  April 29: Inject Lovenox 100 mg in the fatty abdominal tissue at least 2 inches from the belly button twice a day about 12 hours apart, 8am and 8pm rotate sites. No Coumadin.  April 30: Inject Lovenox in the fatty tissue every 12 hours, 8am and 8pm. No Coumadin.  May 1: Inject Lovenox in the fatty tissue every 12 hours, 8am and 8pm. No Coumadin.  May 2: Inject Lovenox in the fatty tissue in the morning at 8 am (No PM dose). No Coumadin.  May 3: Procedure Day - No Lovenox - Resume Coumadin in the evening or as directed by doctor (take an extra half tablet with usual dose for 2 days then resume normal dose).  May 4: Resume Lovenox inject in the fatty tissue every 12 hours and take Coumadin.  May 5: Inject Lovenox in the fatty tissue every 12 hours and take Coumadin.  May 6: Inject Lovenox in the fatty tissue every 12 hours and take Coumadin.  May 7: Inject Lovenox in the fatty tissue every 12 hours and take Coumadin.  May 8: Inject Lovenox in the fatty tissue every 12 hours and take Coumadin.  May 9: Coumadin appt to check INR. 

## 2020-02-23 ENCOUNTER — Other Ambulatory Visit: Payer: Self-pay

## 2020-02-23 ENCOUNTER — Ambulatory Visit (INDEPENDENT_AMBULATORY_CARE_PROVIDER_SITE_OTHER): Payer: BC Managed Care – PPO | Admitting: Physician Assistant

## 2020-02-23 ENCOUNTER — Encounter: Payer: Self-pay | Admitting: Physician Assistant

## 2020-02-23 VITALS — BP 120/88 | HR 82 | Temp 97.4°F | Ht 74.0 in | Wt 218.0 lb

## 2020-02-23 DIAGNOSIS — R0600 Dyspnea, unspecified: Secondary | ICD-10-CM

## 2020-02-23 DIAGNOSIS — Z954 Presence of other heart-valve replacement: Secondary | ICD-10-CM | POA: Diagnosis not present

## 2020-02-23 DIAGNOSIS — I5032 Chronic diastolic (congestive) heart failure: Secondary | ICD-10-CM

## 2020-02-23 NOTE — Progress Notes (Signed)
Cardiology Office Note:    Date:  02/25/2020   ID:  Darren Haynes, DOB Apr 10, 1986, MRN [id number]  PCP:  [facility]  Cardiologist:  Nanetta Batty, MD  Electrophysiologist:  None   Referring MD: [facility]   Chief Complaint  Patient presents with  . Follow-up    seen for Dr. Allyson Sabal    History of Present Illness:    Darren Haynes is a 34 y.o. 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 He was first seen by Dr. Allyson Sabal in January 2020, echocardiogram performed at the time demonstrated normal LV function, peak mitral gradient of 47 mmHg, with mean gradient of 29 mmHg. Mitral valve area 1.5 cm. He had severe dyspnea and lower extremity edema. TEE demonstrated classic rheumatic mitral valve stenosis with mean gradient of 15 mmHg. Left and right heart cath revealed significant mitral valve gradient, normal coronaries. He was referred to Dr. Modesto Charon at [facility] who performed a successful mitral balloon valvuloplasty in February 2020 with immediate improvement in his pulmonary artery pressure and symptom for the next 2 months. However he returned in April with progressive dyspnea and lower extremity edema. TEE obtained on 03/27/2019 demonstrated moderate to severe MS. Echocardiogram revealed mild LV dysfunction with EF of 45 to 50%. Event monitor showed PACs, PVCs and episode of PSVT and NSVT. Due to progressive worsening symptom of mitral stenosis, he eventually underwent mitral valve replacement with aSorin CarboMedicsbileaflet mechanical valve 29 mm performed on 05/14/2019.Postop course complicated by volume overload and postop A. fib which he converted to sinus rhythm on IV amiodarone therapy.On postop day #2, he had an episode of complete heart block. Lopressor PRN was discontinued. He was discharged on 30-day event monitor.  Amiodarone therapy was also discontinued as well. Echocardiogram obtained on 06/23/2019 revealed a normal LV function, no evidence of pericardial effusion with well functioning aortic mechanical prosthesis. He was started on low-dose beta-blocker due to tachycardia.   He was seen by neurology at [facility] in August for diplopia, based on his clinical symptom, image finding and CSF result, it is very unlikely this patient has multiple sclerosis. He was referred to ophthalmology service. He has also been seen by [facility] for bilateral hip pain. 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 He was deemed not a candidate for free vascularized fibular graft. He eventually underwent left total hip replacement at [facility].  Postop course complicated by bacteremia requiring placement of PICC line and IV antibiotic.    Since last office visit, he went to [facility] ED on 11/29/2019 for chest pain.  Chest CT was negative for PE but demonstrated right upper lobe groundglass opacity with patchy nodular opacities in the left lower lobe, suspicious for multifocal infection. Based on the ED physicians report, they do not suspect acute pulmonary infection or any infection stemming from his PICC line.  Right upper extremity ultrasound demonstrated superficial thrombosis of the right basilic vein at the site of PICC line.  I last saw the patient in February 2021 at which time he complained of worsening dyspnea.  We decided to move his repeat echocardiogram to a earlier date.  He eventually had repeat echocardiogram on 01/14/2020 which demonstrated EF 55 to 60%, well-functioning mechanical mitral valve with no evidence of regurgitation or stenosis.  Patient presents today for cardiology office visit.  [facility] surgery plans for repeat hip  surgery in May.  Otherwise he denies any chest pain, he still has occasional dyspnea however this is not interfering with his lifestyle.  On physical exam, I  do not see any lower extremity edema, orthopnea or PND.  Overall, he is doing quite well from cardiology perspective and can follow-up with Dr. Gery Pray in 6 months.  Per patient, he still have nightly episode of fever, this is being followed by his rheumatologist and infectious disease doctor.  He is on chronic antibiotic therapy for his rheumatic heart disease.   Past Medical History:  Diagnosis Date  . Arthritis   . Avascular necrosis (HCC)   . CHF (congestive heart failure) (HCC)   . Chronic back pain   . Depression   . Dyspnea   . GERD (gastroesophageal reflux disease)   . Heart murmur   . Mitral valve stenosis   . Panic attacks   . Pneumonia    hx 4 time  . Pulmonary hypertension (HCC)   . Rheumatic fever   . 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 BALLOON VALVULOPLASTY  12/24/2018   balloon mitral valvuloplasty at [facility]  . MITRAL VALVE REPLACEMENT Right 05/14/2019   Procedure: MINIMALLY INVASIVE MITRAL VALVE (MV) REPLACEMENT USING CARBOMEDICS OPTIFORM Size 69mm;  Surgeon: Purcell Nails, MD;  Location: [facility] OR;  Service: Open Heart Surgery;  Laterality: Right;  . RIGHT/LEFT HEART CATH AND CORONARY ANGIOGRAPHY N/A 12/11/2018   Procedure: RIGHT/LEFT HEART CATH AND CORONARY ANGIOGRAPHY;  Surgeon: Runell Gess, MD;  Location: [facility] INVASIVE CV LAB;  Service: Cardiovascular;  Laterality: N/A;  . TEE WITHOUT CARDIOVERSION N/A 12/11/2018   Procedure: TRANSESOPHAGEAL ECHOCARDIOGRAM (TEE);  Surgeon: Thurmon Fair, MD;  Location: [facility] ENDOSCOPY;  Service: Cardiovascular;  Laterality: N/A;  . TEE WITHOUT CARDIOVERSION N/A 03/27/2019   Procedure: TRANSESOPHAGEAL ECHOCARDIOGRAM (TEE);  Surgeon: Elease Hashimoto Deloris Ping, MD;  Location: [facility] ENDOSCOPY;  Service: Cardiovascular;  Laterality: N/A;  . TEE WITHOUT CARDIOVERSION  N/A 05/14/2019   Procedure: TRANSESOPHAGEAL ECHOCARDIOGRAM (TEE);  Surgeon: Purcell Nails, MD;  Location: [facility] OR;  Service: Open Heart Surgery;  Laterality: N/A;  . TYMPANOSTOMY TUBE PLACEMENT      Current Medications: Current Meds  Medication Sig  . ADVAIR HFA 115-21 MCG/ACT inhaler Inhale 1 puff into the lungs 2 (two) times daily.  [name] Kitchen albuterol (VENTOLIN HFA) 108 (90 Base) MCG/ACT inhaler Inhale 1-2 puffs into the lungs 4 (four) times daily as needed for wheezing or shortness of breath.   . Apremilast (OTEZLA) 30 MG TABS Take 30 mg by mouth in the morning and at bedtime.  [name] Kitchen aspirin EC 81 MG tablet Take 81 mg by mouth daily.  . Buprenorphine HCl (BELBUCA) 750 MCG FILM Place inside cheek.  . busPIRone (BUSPAR) 15 MG tablet Take 15 mg by mouth 3 (three) times daily.  . celecoxib (CELEBREX) 200 MG capsule Take 200 mg by mouth daily.   . Cholecalciferol (VITAMIN D-1000 MAX ST) 25 MCG (1000 UT) tablet Take 1,000 Units by mouth once a week.   . clonazePAM (KLONOPIN) 0.5 MG tablet Take 0.5 mg by mouth at bedtime.  [name] Kitchen Desvenlafaxine Succinate ER 25 MG TB24 Take 25 mg by mouth 2 (two) times daily.  . diclofenac Sodium (VOLTAREN) 1 % GEL SMARTSIG:2 Gram(s) Topical 3 Times Daily PRN  . famotidine (PEPCID) 40 MG tablet Take 40 mg  by mouth daily.  . folic acid (FOLVITE) 1 MG tablet Take by mouth.  . furosemide (LASIX) 40 MG tablet Take 1 tablet (40 mg total) by mouth daily. You may take an extra 20mg  as needed for volume overload.  . gabapentin (NEURONTIN) 800 MG tablet Take 800 mg by mouth 3 (three) times daily.  leflunomide (ARAVA) 20 MG tablet Take 20 mg by mouth daily.  . metoprolol succinate (TOPROL-XL) 50 MG 24 hr tablet Take 50 mg by mouth daily.  . nortriptyline (PAMELOR) 25 MG capsule Take 25 mg by mouth at bedtime.   . ondansetron (ZOFRAN) 4 MG tablet TAKE 1 TABLET BY MOUTH THREE TIMES DAILY  . Oxycodone HCl 10 MG TABS Take 10 mg by mouth every 4 (four) hours as needed (pain).   [name] Kitchen  penicillin v potassium (VEETID) 250 MG tablet Take 250 mg by mouth 2 (two) times a day.  . potassium chloride (KLOR-CON) 10 MEQ tablet Take 10 mEq by mouth daily.  . potassium chloride SA (KLOR-CON) 20 MEQ tablet Take 20 mEq by mouth daily.  . pravastatin (PRAVACHOL) 20 MG tablet Take 20 mg by mouth daily.  . promethazine-dextromethorphan (PROMETHAZINE-DM) 6.25-15 MG/5ML syrup Take 5 mLs by mouth 4 (four) times daily as needed. (Patient taking differently: Take 5 mLs by mouth 4 (four) times daily as needed for cough. )  . risperiDONE (RISPERDAL) 0.25 MG tablet Take 0.25 mg by mouth at bedtime.  01[birth year]1991 tiZANidine (ZANAFLEX) 4 MG tablet Take 4 mg by mouth 3 (three) times daily.  [name] Kitchen warfarin (COUMADIN) 5 MG tablet Take 1 tablet (5 mg total) by mouth daily.  [name] Kitchen XTAMPZA ER 36 MG C12A Take 1 capsule by mouth 2 (two) times daily.   Current Facility-Administered Medications for the 02/23/20 encounter (Office Visit) with 02/25/20, PA  Medication  . metoprolol tartrate (LOPRESSOR) tablet 25 mg     Allergies:   Patient has no known allergies.   Social History   Socioeconomic History  . Marital status: Married    Spouse name: Not on file  . Number of children: Not on file  . Years of education: Not on file  . Highest education level: Not on file  Occupational History  . Not on file  Tobacco Use  . Smoking status: Never Smoker  . Smokeless tobacco: Current User    Types: Snuff  Substance and Sexual Activity  . Alcohol use: No  . Drug use: No  . Sexual activity: Not on file  Other Topics Concern  . Not on file  Social History Narrative  . Not on file   Social Determinants of Health   Financial Resource Strain:   . Difficulty of Paying Living Expenses:   Food Insecurity:   . Worried About [place] Course in the Last Year:   . Programme researcher, broadcasting/film/video in the Last Year:   Transportation Needs:   . Barista (Medical):   Freight forwarder Lack of Transportation (Non-Medical):   Physical Activity:     . Days of Exercise per Week:   . Minutes of Exercise per Session:   Stress:   . Feeling of Stress :   Social Connections:   . Frequency of Communication with Friends and Family:   . Frequency of Social Gatherings with Friends and Family:   . Attends Religious Services:   . Active Member of Clubs or Organizations:   . Attends [name] Kitchen Meetings:   Banker Marital Status:  Family History: The patient's family history includes Depression in his mother; Heart disease in his mother; Hyperlipidemia in his father; Hypertension in his father.  ROS:   Please see the history of present illness.     All other systems reviewed and are negative.  EKGs/Labs/Other Studies Reviewed:    The following studies were reviewed today:  Echo 01/14/2020 1. Left ventricular ejection fraction, by estimation, is 55 to 60%. The  left ventricle has normal function. The left ventricle has no regional  wall motion abnormalities. Left ventricular diastolic parameters are  indeterminate.  2. Right ventricular systolic function is normal. The right ventricular  size is normal.  3. Left atrial size was mild to moderately dilated.  4. Sorin Carbomedics Bileaflet Mechanical Valve (size 29 mm) is in the MV  position.. The mitral valve has been repaired/replaced. No evidence of  mitral valve regurgitation. No evidence of mitral stenosis.  5. The aortic valve is tricuspid. Aortic valve regurgitation is mild. No  aortic stenosis is present.   EKG:  EKG is not ordered today.    Recent Labs: 07/05/2019: Magnesium 1.9 08/16/2019: ALT 20 10/31/2019: B Natriuretic Peptide 26.8; BUN 12; Creatinine, Ser 0.97; Hemoglobin 13.1; Platelets 173; Potassium 2.8; Sodium 137  Recent Lipid Panel No results found for: CHOL, TRIG, HDL, CHOLHDL, VLDL, LDLCALC, LDLDIRECT  Physical Exam:    VS:  BP 120/88   Pulse 82   Temp (!) 97.4 F (36.3 C)   Ht 6\' 2"  (1.88 m)   Wt 218 lb (98.9 kg)   SpO2 97%   BMI 27.99 kg/m      Wt Readings from Last 3 Encounters:  02/23/20 218 lb (98.9 kg)  01/08/20 222 lb 6.4 oz (100.9 kg)  11/17/19 218 lb (98.9 kg)     GEN:  Well nourished, well developed in no acute distress HEENT: Normal NECK: No JVD; No carotid bruits LYMPHATICS: No lymphadenopathy CARDIAC: RRR, no murmurs, rubs, gallops +click RESPIRATORY:  Clear to auscultation without rales, wheezing or rhonchi  ABDOMEN: Soft, non-tender, non-distended MUSCULOSKELETAL:  No edema; No deformity  SKIN: Warm and dry NEUROLOGIC:  Alert and oriented x 3 PSYCHIATRIC:  Normal affect   ASSESSMENT:    1. Dyspnea, unspecified type   2. Chronic diastolic heart failure (HCC)   3. S/P mitral valve replacement with metallic valve    PLAN:    In order of problems listed above:  1. Dyspnea: Recent echocardiogram showed stable mitral valve.  He continued to have intermittent dyspnea, however the degree of dizziness seems to be stable.  He appears to be euvolemic based on exam.  2. History of mitral valve replacement: He has a mechanical mitral valve.  On Coumadin therapy.  He will need SBE prophylaxis for any dental procedure.  3. Chronic diastolic heart failure: Euvolemic on physical exam.   Medication Adjustments/Labs and Tests Ordered: Current medicines are reviewed at length with the patient today.  Concerns regarding medicines are outlined above.  No orders of the defined types were placed in this encounter.  No orders of the defined types were placed in this encounter.   Patient Instructions  Medication Instructions:  Your physician recommends that you continue on your current medications as directed. Please refer to the Current Medication list given to you today.  *If you need a refill on your cardiac medications before your next appointment, please call your pharmacy*  Lab Work: NONE ordered at this time of appointment   If you have labs (blood work) drawn  today and your tests are completely normal,  you will receive your results only by: [name] Kitchen MyChart Message (if you have MyChart) OR . A paper copy in the mail If you have any lab test that is abnormal or we need to change your treatment, we will call you to review the results.  Testing/Procedures: NONE ordered at this time of appointment   Follow-Up: At [facility], you and your health needs are our priority.  As part of our continuing mission to provide you with exceptional heart care, we have created designated Provider Care Teams.  These Care Teams include your primary Cardiologist (physician) and Advanced Practice Providers (APPs -  Physician Assistants and Nurse Practitioners) who all work together to provide you with the care you need, when you need it.  Your next appointment:   6 month(s)  The format for your next appointment:   In Person  Provider:   Quay Burow, MD  Other Instructions      Signed, Almyra Deforest, [place]  02/25/2020 10:59 PM    [place]

## 2020-02-23 NOTE — Patient Instructions (Signed)
Medication Instructions:  Your physician recommends that you continue on your current medications as directed. Please refer to the Current Medication list given to you today.  *If you need a refill on your cardiac medications before your next appointment, please call your pharmacy*  Lab Work: NONE ordered at this time of appointment   If you have labs (blood work) drawn today and your tests are completely normal, you will receive your results only by: MyChart Message (if you have MyChart) OR A paper copy in the mail If you have any lab test that is abnormal or we need to change your treatment, we will call you to review the results.  Testing/Procedures: NONE ordered at this time of appointment   Follow-Up: At [facility], you and your health needs are our priority.  As part of our continuing mission to provide you with exceptional heart care, we have created designated Provider Care Teams.  These Care Teams include your primary Cardiologist (physician) and Advanced Practice Providers (APPs -  Physician Assistants and Nurse Practitioners) who all work together to provide you with the care you need, when you need it.  Your next appointment:   6 month(s)  The format for your next appointment:   In Person  Provider:   Jonathan Berry, MD  Other Instructions   

## 2020-02-25 ENCOUNTER — Encounter: Payer: Self-pay | Admitting: Physician Assistant

## 2020-03-02 ENCOUNTER — Telehealth: Payer: Self-pay

## 2020-03-02 NOTE — Telephone Encounter (Signed)
lmom for overdue inr 

## 2020-03-03 ENCOUNTER — Encounter: Payer: Self-pay | Admitting: Pharmacist Clinician (PhC)/ Clinical Pharmacy Specialist

## 2020-03-03 ENCOUNTER — Ambulatory Visit (INDEPENDENT_AMBULATORY_CARE_PROVIDER_SITE_OTHER): Payer: BC Managed Care – PPO | Admitting: Cardiology

## 2020-03-03 DIAGNOSIS — Z954 Presence of other heart-valve replacement: Secondary | ICD-10-CM | POA: Diagnosis not present

## 2020-03-03 DIAGNOSIS — I050 Rheumatic mitral stenosis: Secondary | ICD-10-CM | POA: Diagnosis not present

## 2020-03-03 LAB — POCT INR: INR: 3 (ref 2.0–3.0)

## 2020-03-03 MED ORDER — ENOXAPARIN SODIUM 100 MG/ML ~~LOC~~ SOLN: 100.0000 mg | Freq: Two times a day (BID) | SUBCUTANEOUS | 0 refills | Status: DC

## 2020-03-03 NOTE — Patient Instructions (Signed)
April 27: Last dose of Coumadin.  April 28: No Coumadin or Lovenox.  April 29: Inject Lovenox 100 mg in the fatty abdominal tissue at least 2 inches from the belly button twice a day about 12 hours apart, 8am and 8pm rotate sites. No Coumadin.  April 30: Inject Lovenox in the fatty tissue every 12 hours, 8am and 8pm. No Coumadin.  May 1: Inject Lovenox in the fatty tissue every 12 hours, 8am and 8pm. No Coumadin.  May 2: Inject Lovenox in the fatty tissue in the morning at 8 am (No PM dose). No Coumadin.  May 3: Procedure Day - No Lovenox - Resume Coumadin in the evening or as directed by doctor (take an extra half tablet with usual dose for 2 days then resume normal dose).  May 4: Resume Lovenox inject in the fatty tissue every 12 hours and take Coumadin.  May 5: Inject Lovenox in the fatty tissue every 12 hours and take Coumadin.  May 6: Inject Lovenox in the fatty tissue every 12 hours and take Coumadin.  May 7: Inject Lovenox in the fatty tissue every 12 hours and take Coumadin.  May 8: Inject Lovenox in the fatty tissue every 12 hours and take Coumadin.  May 9: Coumadin appt to check INR.

## 2020-03-08 MED ORDER — WARFARIN SODIUM 5 MG PO TABS: ORAL_TABLET | ORAL | 1 refills | Status: DC

## 2020-03-08 NOTE — Addendum Note (Signed)
Addended by: Rosalee Kaufman on: 03/08/2020 11:48 AM   Modules accepted: Orders

## 2020-03-15 ENCOUNTER — Encounter: Payer: Self-pay | Admitting: Pharmacist Clinician (PhC)/ Clinical Pharmacy Specialist

## 2020-03-15 ENCOUNTER — Telehealth: Payer: Self-pay | Admitting: Pharmacist Clinician (PhC)/ Clinical Pharmacy Specialist

## 2020-03-15 MED ORDER — ENOXAPARIN SODIUM 100 MG/ML ~~LOC~~ SOLN: 100.0000 mg | Freq: Two times a day (BID) | SUBCUTANEOUS | 0 refills | Status: DC

## 2020-03-15 NOTE — Telephone Encounter (Signed)
Patient called to report that surgery has been moved to May 17.  He apparently was confused and took a lovenox injection yesterday evening.    New bridging schedule:  May 11: Last dose of Coumadin.  May 12: No Coumadin or Lovenox.   May 13: Inject Lovenox 100 mg in the fatty abdominal tissue at least 2 inches from the belly button twice a day about 12 hours apart, 8am and 8pm rotate sites. No Coumadin.  May 14: Inject Lovenox in the fatty tissue every 12 hours, 8am and 8pm. No Coumadin.  May 15: Inject Lovenox in the fatty tissue every 12 hours, 8am and 8pm. No Coumadin.  May 16: Inject Lovenox in the fatty tissue in the morning at 8 am (No PM dose). No Coumadin.  May 17: Procedure Day - No Lovenox - Resume Coumadin 5 mg in the evening or as directed by doctor.  May 18: Resume Lovenox inject in the fatty tissue every 12 hours and take Coumadin 7.5 mg.  May 19: Inject Lovenox in the fatty tissue every 12 hours and take Coumadin 7.5 mg  May 20: Inject Lovenox in the fatty tissue every 12 hours and take Coumadin 5 mg   May 21: Inject Lovenox in the fatty tissue every 12 hours and take Coumadin 7.5 mg  May 22: Inject Lovenox in the fatty tissue every 12 hours and take Coumadin 5 mg.  May 23: Coumadin appt to check INR.

## 2020-03-18 LAB — POCT INR: INR: 1.8 — AB (ref 2.0–3.0)

## 2020-03-20 LAB — POCT INR: INR: 2.4 (ref 2.0–3.0)

## 2020-03-21 ENCOUNTER — Ambulatory Visit (INDEPENDENT_AMBULATORY_CARE_PROVIDER_SITE_OTHER): Payer: BC Managed Care – PPO

## 2020-03-21 DIAGNOSIS — Z954 Presence of other heart-valve replacement: Secondary | ICD-10-CM | POA: Diagnosis not present

## 2020-03-21 DIAGNOSIS — I050 Rheumatic mitral stenosis: Secondary | ICD-10-CM | POA: Diagnosis not present

## 2020-04-03 LAB — POCT INR: INR: 4.4 — AB (ref 2.0–3.0)

## 2020-04-04 ENCOUNTER — Ambulatory Visit (INDEPENDENT_AMBULATORY_CARE_PROVIDER_SITE_OTHER): Payer: BC Managed Care – PPO | Admitting: Pharmacist

## 2020-04-04 DIAGNOSIS — Z7901 Long term (current) use of anticoagulants: Secondary | ICD-10-CM

## 2020-04-04 DIAGNOSIS — Z954 Presence of other heart-valve replacement: Secondary | ICD-10-CM | POA: Diagnosis not present

## 2020-04-06 ENCOUNTER — Telehealth: Payer: Self-pay | Admitting: Pharmacist

## 2020-04-06 NOTE — Telephone Encounter (Signed)
Pt called clinic stating he saw Dr Darin Engels yesterday at [facility] cardiology at follow up after his mechanical MVR. He states his leg "busted open" and then stopped. Then states when he left his appt and was in the parking lot, his leg started bleeding again. He called [facility] who advised him to go to the ED. They placed a wound vac but states Dr Melvyn Neth the surgeon advised him to stop his warfarin and start Lovenox 100mg  BID.   ED note as follows for reference:  "Recommend:  for 7 days  Dr Darliss Ridgel office to contact patient to schedule appt  Lovenox treatment dose, hold coumadin 5 days, then restart lovenox bridge in 6 days  F/u Cardiologist in 7 days to ensure appropriate bridge to coumadin  Prophylactic antibiotics   I discussed this patient with Dr. Melvyn Neth , the attending surgeon. Dr. Pernell Dupre reviewed the patient's history, clinical evaluation, and diagnostic studies and agrees with the plan of care. Dr. Melvyn Neth was immediately available for all aspects of patient care."  Pt states they may be planning any follow up surgery if the bleeding does not stop. He follows up with Dr Melvyn Neth the orthopedic surgeon on Friday 5/28. Advised him to confirm with Dr 04-20-1975 on 5/28 that he is still safe to resume warfarin after a 5 day hold (this would have him restarting warfarin on 5/30). Advised if he can restart warfarin then, he would need to check his INR at home on June 3 or 4 and call results to the Coumadin clinic. He verbalized understanding.

## 2020-04-07 ENCOUNTER — Ambulatory Visit: Payer: Self-pay | Admitting: Pharmacist

## 2020-04-10 ENCOUNTER — Emergency Department (HOSPITAL_COMMUNITY)
Admission: EM | Admit: 2020-04-10 | Discharge: 2020-04-10 | Disposition: A | Discharge status: Home / Self Care | Payer: BC Managed Care – PPO | Attending: Emergency Medicine | Admitting: Emergency Medicine

## 2020-04-10 ENCOUNTER — Encounter (HOSPITAL_COMMUNITY): Payer: Self-pay | Admitting: Emergency Medicine

## 2020-04-10 ENCOUNTER — Emergency Department (HOSPITAL_COMMUNITY): Payer: BC Managed Care – PPO

## 2020-04-10 ENCOUNTER — Other Ambulatory Visit: Payer: Self-pay

## 2020-04-10 DIAGNOSIS — F17220 Nicotine dependence, chewing tobacco, uncomplicated: Secondary | ICD-10-CM | POA: Insufficient documentation

## 2020-04-10 DIAGNOSIS — R5383 Other fatigue: Secondary | ICD-10-CM | POA: Diagnosis not present

## 2020-04-10 DIAGNOSIS — Z4801 Encounter for change or removal of surgical wound dressing: Secondary | ICD-10-CM | POA: Insufficient documentation

## 2020-04-10 DIAGNOSIS — R0602 Shortness of breath: Secondary | ICD-10-CM | POA: Diagnosis not present

## 2020-04-10 DIAGNOSIS — Z79899 Other long term (current) drug therapy: Secondary | ICD-10-CM | POA: Insufficient documentation

## 2020-04-10 DIAGNOSIS — I5032 Chronic diastolic (congestive) heart failure: Secondary | ICD-10-CM | POA: Diagnosis not present

## 2020-04-10 DIAGNOSIS — Z7901 Long term (current) use of anticoagulants: Secondary | ICD-10-CM | POA: Insufficient documentation

## 2020-04-10 DIAGNOSIS — Z7982 Long term (current) use of aspirin: Secondary | ICD-10-CM | POA: Diagnosis not present

## 2020-04-10 DIAGNOSIS — Z96642 Presence of left artificial hip joint: Secondary | ICD-10-CM | POA: Diagnosis not present

## 2020-04-10 DIAGNOSIS — R531 Weakness: Secondary | ICD-10-CM | POA: Insufficient documentation

## 2020-04-10 DIAGNOSIS — R42 Dizziness and giddiness: Secondary | ICD-10-CM | POA: Diagnosis not present

## 2020-04-10 LAB — COMPREHENSIVE METABOLIC PANEL
ALT: 12 U/L (ref 0–44)
AST: 14 U/L — ABNORMAL LOW (ref 15–41)
Albumin: 3.6 g/dL (ref 3.5–5.0)
Alkaline Phosphatase: 84 U/L (ref 38–126)
Anion gap: 8 (ref 5–15)
BUN: 10 mg/dL (ref 6–20)
CO2: 24 mmol/L (ref 22–32)
Calcium: 8.7 mg/dL — ABNORMAL LOW (ref 8.9–10.3)
Chloride: 104 mmol/L (ref 98–111)
Creatinine, Ser: 0.91 mg/dL (ref 0.61–1.24)
GFR calc Af Amer: >60 mL/min (ref >60)
GFR calc non Af Amer: >60 mL/min (ref >60)
Glucose, Bld: 105 mg/dL — ABNORMAL HIGH (ref 70–99)
Potassium: 3.8 mmol/L (ref 3.5–5.1)
Sodium: 136 mmol/L (ref 135–145)
Total Bilirubin: 0.4 mg/dL (ref 0.3–1.2)
Total Protein: 6.8 g/dL (ref 6.5–8.1)

## 2020-04-10 LAB — CBC WITH DIFFERENTIAL/PLATELET
Abs Immature Granulocytes: 0.01 10*3/uL (ref 0.00–0.07)
Basophils Absolute: 0.1 10*3/uL (ref 0.0–0.1)
Basophils Relative: 1 %
Eosinophils Absolute: 0.1 10*3/uL (ref 0.0–0.5)
Eosinophils Relative: 3 %
HCT: 34.6 % — ABNORMAL LOW (ref 39.0–52.0)
Hemoglobin: 10.6 g/dL — ABNORMAL LOW (ref 13.0–17.0)
Immature Granulocytes: 0 %
Lymphocytes Relative: 27 %
Lymphs Abs: 1.4 10*3/uL (ref 0.7–4.0)
MCH: 27 pg (ref 26.0–34.0)
MCHC: 30.6 g/dL (ref 30.0–36.0)
MCV: 88.3 fL (ref 80.0–100.0)
Monocytes Absolute: 0.4 10*3/uL (ref 0.1–1.0)
Monocytes Relative: 9 %
Neutro Abs: 3.1 10*3/uL (ref 1.7–7.7)
Neutrophils Relative %: 60 %
Platelets: 208 10*3/uL (ref 150–400)
RBC: 3.92 MIL/uL — ABNORMAL LOW (ref 4.22–5.81)
RDW: 14.6 % (ref 11.5–15.5)
WBC: 5.1 10*3/uL (ref 4.0–10.5)
nRBC: 0 % (ref 0.0–0.2)

## 2020-04-10 LAB — PROTIME-INR
INR: 1.1 (ref 0.8–1.2)
Prothrombin Time: 13.4 seconds (ref 11.4–15.2)

## 2020-04-10 LAB — TROPONIN I (HIGH SENSITIVITY): Troponin I (High Sensitivity): <2 ng/L (ref <18)

## 2020-04-10 MED ORDER — SODIUM CHLORIDE 0.9 % IV BOLUS
1000.0000 mL | Freq: Once | INTRAVENOUS | Status: AC
  Administered 2020-04-10: 1000 mL via INTRAVENOUS

## 2020-04-10 NOTE — Discharge Instructions (Addendum)
Continue medications as previously prescribed.  Follow-up with your surgeons later this week, and return to the ER if symptoms significantly worsen or change.

## 2020-04-10 NOTE — ED Provider Notes (Signed)
[facility] EMERGENCY DEPARTMENT Provider Note   CSN: [id number] Arrival date & time: 04/10/20  1717     History Chief Complaint  Patient presents with  . Chest Pain    Darren Haynes is a 34 y.o. male.  Patient is a 34 year old male with past medical history of rheumatic fever with mitral valve insufficiency requiring replacement. This was performed via minimally invasive surgery 2 years ago. Patient has been on Coumadin until recently when he had a revision of a left total hip replacement. This was performed at [facility] several years ago related to avascular necrosis/psoriatic arthritis. This apparently became infected and he has had several revisions since. Most recent revision was about 2 weeks ago. Several days ago, the wound opened up and began draining and a wound VAC was put in place. Since the wound VAC was applied, patient has emptied approximately 150 mL of bloody drainage on 6 separate occasions. He is now feeling weak and lightheaded and is concerned he may be losing too much blood. He denies to me he is experiencing any fevers or chills. He does feel somewhat short of breath with ambulation and weak with standing. He is currently administering Lovenox injections twice daily.  The history is provided by the patient.       Past Medical History:  Diagnosis Date  . Arthritis   . Avascular necrosis (HCC)   . CHF (congestive heart failure) (HCC)   . Chronic back pain   . Depression   . Dyspnea   . GERD (gastroesophageal reflux disease)   . Heart murmur   . Mitral valve stenosis   . Panic attacks   . Pneumonia    hx 4 time  . Pulmonary hypertension (HCC)   . Rheumatic fever   . 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 Spermatocele 11/12/2019  . Left hip prosthetic joint infection (HCC)  11/08/2019  . Status post hip replacement, left 09/25/2019  . Avascular necrosis of bone of left hip (HCC) 09/10/2019  . Anxiety and depression 08/21/2019  . Chronic diastolic heart failure (HCC) 08/21/2019  . Chronic pain 08/21/2019  . Inflammatory arthritis 08/21/2019  . Rheumatic heart disease 08/21/2019  . Dry eye syndrome of both eyes 08/13/2019  . Monocular diplopia 08/13/2019  . Transient visual loss, bilateral 08/13/2019  . Heart rate fast 07/07/2019  . Dysfunction of both eustachian tubes 06/17/2019  . 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 PSVT (paroxysmal supraventricular tachycardia) (HCC) 03/12/2019  . Nonsustained ventricular tachycardia (HCC) 03/12/2019  . Bilateral lower extremity edema 03/12/2019  . Dyspnea on exertion 03/12/2019  . Tobacco use disorder 01/20/2019  . Mitral stenosis 12/09/2018  . Pulmonary hypertension (HCC) 11/21/2018  . Neck pain 07/08/2018  . Rib pain 06/05/2018  . Acute otitis externa of left ear 06/04/2016  . Acute suppurative otitis media of left ear with spontaneous rupture of tympanic membrane 06/04/2016  . Gastroesophageal reflux disease without esophagitis 06/15/2014  . Obesity (BMI 30-39.9) 06/15/2014  . OSA (obstructive sleep apnea) 01/07/2014    Past Surgical History:  Procedure Laterality Date  . BALLOON VALVULOPLASTY  12/24/2018   balloon mitral valvuloplasty at [facility]  . JOINT REPLACEMENT    . MITRAL VALVE REPLACEMENT Right 05/14/2019   Procedure: MINIMALLY INVASIVE MITRAL VALVE (MV) REPLACEMENT USING CARBOMEDICS OPTIFORM  Size 39mm;  Surgeon: Rexene Alberts, MD;  Location: [place];  Service: Open Heart Surgery;  Laterality: Right;  . RIGHT/LEFT HEART CATH AND CORONARY ANGIOGRAPHY N/A 12/11/2018   Procedure: RIGHT/LEFT HEART CATH AND CORONARY ANGIOGRAPHY;  Surgeon: Lorretta Harp, MD;  Location: [place] [facility];  Service: Cardiovascular;  Laterality: N/A;  . TEE WITHOUT CARDIOVERSION  N/A 12/11/2018   Procedure: TRANSESOPHAGEAL ECHOCARDIOGRAM (TEE);  Surgeon: Sanda Klein, MD;  Location: [place];  Service: Cardiovascular;  Laterality: N/A;  . TEE WITHOUT CARDIOVERSION N/A 03/27/2019   Procedure: TRANSESOPHAGEAL ECHOCARDIOGRAM (TEE);  Surgeon: Acie Fredrickson Wonda Cheng, MD;  Location: [place];  Service: Cardiovascular;  Laterality: N/A;  . TEE WITHOUT CARDIOVERSION N/A 05/14/2019   Procedure: TRANSESOPHAGEAL ECHOCARDIOGRAM (TEE);  Surgeon: Rexene Alberts, MD;  Location: [place];  Service: Open Heart Surgery;  Laterality: N/A;  . TYMPANOSTOMY TUBE PLACEMENT         Family History  Problem Relation Age of Onset  . Heart disease Mother   . Depression Mother   . Hypertension Father   . Hyperlipidemia Father     Social History   Tobacco Use  . Smoking status: Never Smoker  . Smokeless tobacco: Current User    Types: Snuff  Substance Use Topics  . Alcohol use: No  . Drug use: No    Home Medications Prior to Admission medications   Medication Sig Start Date End Date Taking? Authorizing Provider  ADVAIR HFA 115-21 MCG/ACT inhaler Inhale 1 puff into the lungs 2 (two) times daily. 08/07/19   [provider]  albuterol (VENTOLIN HFA) 108 (90 Base) MCG/ACT inhaler Inhale 1-2 puffs into the lungs 4 (four) times daily as needed for wheezing or shortness of breath.  07/08/19   [provider]  Apremilast (OTEZLA) 30 MG TABS Take 30 mg by mouth in the morning and at bedtime. 12/07/19   [provider]  aspirin EC 81 MG tablet Take 81 mg by mouth daily.    [provider]  Buprenorphine HCl (BELBUCA) 750 MCG FILM Place inside cheek.    [provider]  busPIRone (BUSPAR) 15 MG tablet Take 15 mg by mouth 3 (three) times daily. 07/15/19   [provider]  celecoxib (CELEBREX) 200 MG capsule Take 200 mg by mouth daily.  06/30/19   [provider]  Cholecalciferol (VITAMIN D-1000 MAX ST) 25 MCG (1000 UT) tablet Take 1,000  Units by mouth once a week.     [provider]  clonazePAM (KLONOPIN) 0.5 MG tablet Take 0.5 mg by mouth at bedtime.    [provider]  Desvenlafaxine Succinate ER 25 MG TB24 Take 25 mg by mouth 2 (two) times daily.    [provider]  diclofenac Sodium (VOLTAREN) 1 % GEL SMARTSIG:2 Gram(s) Topical 3 Times Daily PRN 02/10/20   [provider]  enoxaparin (LOVENOX) 100 MG/ML injection Inject 1 mL (100 mg total) into the skin every 12 (twelve) hours. 03/15/20   Lorretta Harp, MD  famotidine (PEPCID) 40 MG tablet Take 40 mg by mouth daily.    [provider]  folic acid (FOLVITE) 1 MG tablet Take by mouth. 02/02/19   [provider]  furosemide (LASIX) 40 MG tablet Take 1 tablet (40 mg total) by mouth daily. You may take an extra 20mg  as needed for volume overload. 07/23/19   Almyra Deforest, PA  gabapentin (NEURONTIN) 800 MG tablet Take 800 mg by mouth 3 (three) times daily. 02/09/20   [provider]  leflunomide (ARAVA) 20 MG tablet Take 20 mg by mouth daily.    [provider]  metoprolol succinate (TOPROL-XL) 50 MG 24 hr tablet Take 50 mg by mouth daily. 01/30/20   [provider]  nortriptyline (PAMELOR) 25 MG capsule Take 25 mg by mouth at bedtime.  10/24/18   [provider]  ondansetron (ZOFRAN) 4 MG tablet TAKE 1 TABLET BY MOUTH THREE TIMES DAILY 12/15/19   [provider]  Oxycodone HCl 10 MG TABS Take 10 mg by mouth every 4 (four) hours as needed (pain).  07/08/19   [provider]  penicillin v potassium (VEETID) 250 MG tablet Take 250 mg by mouth 2 (two) times a day.    [provider]  potassium chloride (KLOR-CON) 10 MEQ tablet Take 10 mEq by mouth daily. 02/04/20   [provider]  potassium chloride SA (KLOR-CON) 20 MEQ tablet Take 20 mEq by mouth daily. 07/29/19   [provider]  pravastatin (PRAVACHOL) 20 MG tablet Take 20 mg by mouth daily.    [provider]  promethazine-dextromethorphan (PROMETHAZINE-DM) 6.25-15 MG/5ML syrup Take 5 mLs by mouth 4 (four) times daily as needed. Patient taking differently: Take 5 mLs by mouth 4 (four) times daily as needed for cough.  08/16/19   Worthy Rancher B, FNP  risperiDONE (RISPERDAL) 0.25 MG tablet Take 0.25 mg by mouth at bedtime. 01/15/20   [provider]  tiZANidine (ZANAFLEX) 4 MG tablet Take 4 mg by mouth 3 (three) times daily. 04/22/19   [provider]  warfarin (COUMADIN) 5 MG tablet Take 1 to 1.5 tablets by mouth daily as directed by coumadin clinic 03/08/20   Runell Gess, MD  XTAMPZA ER 36 MG C12A Take 1 capsule by mouth 2 (two) times daily. 02/11/20   [provider]    Allergies    Patient has no known allergies.  Review of Systems   Review of Systems  All other systems reviewed and are negative.   Physical Exam Updated Vital Signs BP 139/81 (BP Location: Left Arm)   Pulse 80   Temp 97.9 F (36.6 C) (Oral)   Resp 18   Ht 6\' 2"  (1.88 m)   Wt 95.3 kg   SpO2 100%   BMI 26.96 kg/m   Physical Exam Vitals and nursing note reviewed.  Constitutional:      General: He is not in acute distress.    Appearance: He is well-developed. He is not diaphoretic.  HENT:     Head: Normocephalic and atraumatic.  Cardiovascular:     Rate and Rhythm: Normal rate and regular rhythm.     Heart sounds: No murmur. No friction rub.  Pulmonary:     Effort: Pulmonary effort is normal. No respiratory distress.     Breath sounds: Normal breath sounds. No wheezing or rales.  Abdominal:     General: Bowel sounds are normal. There is no distension.     Palpations: Abdomen is soft.     Tenderness: There is no abdominal tenderness.  Musculoskeletal:        General: Normal range of motion.     Cervical back: Normal range of motion and neck supple.     Comments: There is a wound VAC and dressing in place over the left hip incision.  There is dark red, bloody appearing  liquid noted in the wound VAC.  There is no significant erythema extending from the wound.  He seems to have good  range of motion.  Distal pulses, motor, and sensation are intact.  Skin:    General: Skin is warm and dry.  Neurological:     Mental Status: He is alert and oriented to person, place, and time.     Coordination: Coordination normal.     ED Results / Procedures / Treatments   Labs (all labs ordered are listed, but only abnormal results are displayed) Labs Reviewed  PROTIME-INR  COMPREHENSIVE METABOLIC PANEL  CBC WITH DIFFERENTIAL/PLATELET  TROPONIN I (HIGH SENSITIVITY)    EKG None  Radiology No results found.  Procedures Procedures (including critical care time)  Medications Ordered in ED Medications  sodium chloride 0.9 % bolus 1,000 mL (has no administration in time range)    ED Course  I have reviewed the triage vital signs and the nursing notes.  Pertinent labs & imaging results that were available during my care of the patient were reviewed by me and considered in my medical decision making (see chart for details).    MDM Rules/Calculators/A&P  Patient is a 34 year old male with past medical history as described in the HPI.  He presents today with complaints of fatigue and concern about blood loss and the wound VAC to his left hip.    His laboratory studies are unremarkable with hemoglobin of 10.6, which is actually up from his visit to [facility] several days ago when it was 9.9.  His chest x-ray shows no evidence for pneumonia.  He is afebrile with no white count.  His vitals are stable with no tachycardia or hypoxia.  Patient is receiving Lovenox injections at home and I highly doubt PE.  At this point, I feel as though emergent pathology has been ruled out.  Patient seems appropriate for discharge with outpatient follow-up this week.  Final Clinical Impression(s) / ED Diagnoses Final diagnoses:  None    Rx / DC Orders ED Discharge Orders    None        Geoffery Lyons, MD 04/10/20 1924

## 2020-04-10 NOTE — ED Triage Notes (Addendum)
Pt c/o chest pressure, weakness and SOB since yesterday, worsening today; pt reports he had hip replacement surgery about 2 weeks ago, wound opened up and now has wound vac, taking Lovenox; pt concerned he may be losing too much blood post surgery-reports he has emptied his would vac ( ) 6 times since Wed.

## 2020-04-19 LAB — POCT INR: INR: 3.1 — AB (ref 2.0–3.0)

## 2020-04-21 ENCOUNTER — Ambulatory Visit (INDEPENDENT_AMBULATORY_CARE_PROVIDER_SITE_OTHER): Payer: BC Managed Care – PPO | Admitting: Pharmacist Clinician (PhC)/ Clinical Pharmacy Specialist

## 2020-04-21 DIAGNOSIS — Z954 Presence of other heart-valve replacement: Secondary | ICD-10-CM | POA: Diagnosis not present

## 2020-04-21 DIAGNOSIS — Z7901 Long term (current) use of anticoagulants: Secondary | ICD-10-CM | POA: Diagnosis not present

## 2020-04-24 ENCOUNTER — Encounter (HOSPITAL_COMMUNITY): Payer: Self-pay | Admitting: Emergency Medicine

## 2020-04-24 ENCOUNTER — Other Ambulatory Visit: Payer: Self-pay

## 2020-04-24 ENCOUNTER — Emergency Department (HOSPITAL_COMMUNITY)
Admission: EM | Admit: 2020-04-24 | Discharge: 2020-04-24 | Disposition: A | Discharge status: Home / Self Care | Payer: BC Managed Care – PPO | Attending: Emergency Medicine | Admitting: Emergency Medicine

## 2020-04-24 DIAGNOSIS — Z7982 Long term (current) use of aspirin: Secondary | ICD-10-CM | POA: Diagnosis not present

## 2020-04-24 DIAGNOSIS — I509 Heart failure, unspecified: Secondary | ICD-10-CM | POA: Insufficient documentation

## 2020-04-24 DIAGNOSIS — M25552 Pain in left hip: Secondary | ICD-10-CM | POA: Diagnosis not present

## 2020-04-24 DIAGNOSIS — Z7901 Long term (current) use of anticoagulants: Secondary | ICD-10-CM | POA: Diagnosis not present

## 2020-04-24 DIAGNOSIS — Z4889 Encounter for other specified surgical aftercare: Secondary | ICD-10-CM

## 2020-04-24 DIAGNOSIS — G8918 Other acute postprocedural pain: Secondary | ICD-10-CM | POA: Diagnosis not present

## 2020-04-24 DIAGNOSIS — Z79899 Other long term (current) drug therapy: Secondary | ICD-10-CM | POA: Diagnosis not present

## 2020-04-24 LAB — LACTIC ACID, PLASMA: Lactic Acid, Venous: 1.2 mmol/L (ref 0.5–1.9)

## 2020-04-24 LAB — CBC WITH DIFFERENTIAL/PLATELET
Abs Immature Granulocytes: 0.02 10*3/uL (ref 0.00–0.07)
Basophils Absolute: 0.1 10*3/uL (ref 0.0–0.1)
Basophils Relative: 1 %
Eosinophils Absolute: 0.2 10*3/uL (ref 0.0–0.5)
Eosinophils Relative: 2 %
HCT: 37.7 % — ABNORMAL LOW (ref 39.0–52.0)
Hemoglobin: 11.9 g/dL — ABNORMAL LOW (ref 13.0–17.0)
Immature Granulocytes: 0 %
Lymphocytes Relative: 19 %
Lymphs Abs: 1.6 10*3/uL (ref 0.7–4.0)
MCH: 27.2 pg (ref 26.0–34.0)
MCHC: 31.6 g/dL (ref 30.0–36.0)
MCV: 86.3 fL (ref 80.0–100.0)
Monocytes Absolute: 0.8 10*3/uL (ref 0.1–1.0)
Monocytes Relative: 9 %
Neutro Abs: 6.1 10*3/uL (ref 1.7–7.7)
Neutrophils Relative %: 69 %
Platelets: 230 10*3/uL (ref 150–400)
RBC: 4.37 MIL/uL (ref 4.22–5.81)
RDW: 14.6 % (ref 11.5–15.5)
WBC: 8.7 10*3/uL (ref 4.0–10.5)
nRBC: 0 % (ref 0.0–0.2)

## 2020-04-24 LAB — COMPREHENSIVE METABOLIC PANEL
ALT: 16 U/L (ref 0–44)
AST: 14 U/L — ABNORMAL LOW (ref 15–41)
Albumin: 3.9 g/dL (ref 3.5–5.0)
Alkaline Phosphatase: 85 U/L (ref 38–126)
Anion gap: 10 (ref 5–15)
BUN: 13 mg/dL (ref 6–20)
CO2: 21 mmol/L — ABNORMAL LOW (ref 22–32)
Calcium: 8.7 mg/dL — ABNORMAL LOW (ref 8.9–10.3)
Chloride: 108 mmol/L (ref 98–111)
Creatinine, Ser: 0.96 mg/dL (ref 0.61–1.24)
GFR calc Af Amer: >60 mL/min (ref >60)
GFR calc non Af Amer: >60 mL/min (ref >60)
Glucose, Bld: 107 mg/dL — ABNORMAL HIGH (ref 70–99)
Potassium: 3.9 mmol/L (ref 3.5–5.1)
Sodium: 139 mmol/L (ref 135–145)
Total Bilirubin: 0.5 mg/dL (ref 0.3–1.2)
Total Protein: 6.7 g/dL (ref 6.5–8.1)

## 2020-04-24 MED ORDER — SODIUM CHLORIDE 0.9% FLUSH: 3.0000 mL | Freq: Once | INTRAVENOUS | Status: DC

## 2020-04-24 MED ORDER — OXYCODONE HCL 5 MG PO TABS
10.0000 mg | ORAL_TABLET | Freq: Once | ORAL | Status: AC
  Administered 2020-04-24: 10 mg via ORAL
  Filled 2020-04-24: qty 2

## 2020-04-24 NOTE — Discharge Instructions (Addendum)
You presented to the ED with acute onset of left hip pain as well as some increased drainage your surgical site from your hip replacement on 5/29.  Your lab work and vital signs are reassuring that you do not have a significant acute infection at this time.  However this can always change.  We called [facility] and based on your vital signs are presentation and they feel comfortable with you following up early next week with Dr. Melvyn Neth.  Please use information above to call Dr. Theadore Nan office for close follow-up.  If symptoms change, if pain worsens or you start having fevers fast heart rate or other signs of infection please return to the ED for further evaluation.  Continue your normal pain medication.

## 2020-04-24 NOTE — ED Provider Notes (Signed)
[facility] EMERGENCY DEPARTMENT Provider Note   CSN: [id number] Arrival date & time: 04/24/20  1621     History Chief Complaint  Patient presents with  . Hip Pain    Darren Haynes is a 34 y.o. male.  Patient is a 34 year old male with a complex medical history with multiple left hip replacements due to infection with last replacement 04/09/2020 presenting to the ED with increased drainage from wound site and increasing pain with patient concern for return of hip infection.  Patient states he has fevers every other night due to his rheumatic fever history but denies worsening of the symptoms.  The history is provided by the patient and medical records.  Illness Location:  Left hip Quality:  Pain increased surgical site drainage Severity:  Mild Onset quality:  Sudden Duration:  12 hours Timing:  Constant Progression:  Unchanged Chronicity:  New Context:  Patient had hip replacement surgery on 5/29 has history of infections Relieved by:  Nothing tried Worsened by:  Nothing Ineffective treatments:  None tried Associated symptoms: no abdominal pain, no chest pain, no congestion, no cough, no fever, no headaches, no loss of consciousness, no nausea, no shortness of breath and no vomiting        Past Medical History:  Diagnosis Date  . Arthritis   . Avascular necrosis (HCC)   . CHF (congestive heart failure) (HCC)   . Chronic back pain   . Depression   . Dyspnea   . GERD (gastroesophageal reflux disease)   . Heart murmur   . Mitral valve stenosis   . Panic attacks   . Pneumonia    hx 4 time  . Pulmonary hypertension (HCC)   . Rheumatic fever   . 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 Spermatocele 11/12/2019  . 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 Status post hip replacement, left 09/25/2019  . Avascular necrosis of bone of left hip (HCC) 09/10/2019  . Anxiety and depression 08/21/2019  . Chronic diastolic heart failure (HCC) 08/21/2019  . Chronic pain 08/21/2019  . Inflammatory arthritis 08/21/2019  . Rheumatic heart disease 08/21/2019  . Dry eye syndrome of both eyes 08/13/2019  . Monocular diplopia 08/13/2019  . Transient visual loss, bilateral 08/13/2019  . Heart rate fast 07/07/2019  . Dysfunction of both eustachian tubes 06/17/2019  . 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 PSVT (paroxysmal supraventricular tachycardia) (HCC) 03/12/2019  . Nonsustained ventricular tachycardia (HCC) 03/12/2019  . Bilateral lower extremity edema 03/12/2019  . Dyspnea on exertion 03/12/2019  . Tobacco use disorder 01/20/2019  . Mitral stenosis 12/09/2018  . Pulmonary hypertension (HCC) 11/21/2018  . Neck pain 07/08/2018  . Rib pain 06/05/2018  . Acute otitis externa of left ear 06/04/2016  . Acute suppurative otitis media of left ear with spontaneous rupture of tympanic membrane 06/04/2016  . Gastroesophageal reflux disease without esophagitis 06/15/2014  . Obesity (BMI 30-39.9) 06/15/2014  . OSA (obstructive sleep apnea) 01/07/2014    Past Surgical History:  Procedure Laterality Date  . BALLOON VALVULOPLASTY  12/24/2018   balloon mitral valvuloplasty at [facility]  . JOINT REPLACEMENT    . MITRAL VALVE REPLACEMENT Right 05/14/2019   Procedure: MINIMALLY INVASIVE MITRAL VALVE (MV) REPLACEMENT USING CARBOMEDICS OPTIFORM Size 56mm;  Surgeon: Cornelius Moras,  Salvatore Decent, MD;  Location: [facility] OR;  Service: Open Heart Surgery;  Laterality: Right;  . RIGHT/LEFT HEART CATH AND CORONARY ANGIOGRAPHY N/A 12/11/2018   Procedure: RIGHT/LEFT HEART CATH AND CORONARY ANGIOGRAPHY;  Surgeon: Runell Gess, MD;  Location: [facility] INVASIVE CV LAB;  Service: Cardiovascular;  Laterality: N/A;  . TEE WITHOUT CARDIOVERSION N/A  12/11/2018   Procedure: TRANSESOPHAGEAL ECHOCARDIOGRAM (TEE);  Surgeon: Thurmon Fair, MD;  Location: [facility] ENDOSCOPY;  Service: Cardiovascular;  Laterality: N/A;  . TEE WITHOUT CARDIOVERSION N/A 03/27/2019   Procedure: TRANSESOPHAGEAL ECHOCARDIOGRAM (TEE);  Surgeon: Elease Hashimoto Deloris Ping, MD;  Location: [facility] ENDOSCOPY;  Service: Cardiovascular;  Laterality: N/A;  . TEE WITHOUT CARDIOVERSION N/A 05/14/2019   Procedure: TRANSESOPHAGEAL ECHOCARDIOGRAM (TEE);  Surgeon: Purcell Nails, MD;  Location: [facility] OR;  Service: Open Heart Surgery;  Laterality: N/A;  . TYMPANOSTOMY TUBE PLACEMENT         Family History  Problem Relation Age of Onset  . Heart disease Mother   . Depression Mother   . Hypertension Father   . Hyperlipidemia Father     Social History   Tobacco Use  . Smoking status: Never Smoker  . Smokeless tobacco: Current User    Types: Snuff  Vaping Use  . Vaping Use: Never used  Substance Use Topics  . Alcohol use: No  . Drug use: No    Home Medications Prior to Admission medications   Medication Sig Start Date End Date Taking? Authorizing Provider  ADVAIR HFA 115-21 MCG/ACT inhaler Inhale 1 puff into the lungs 2 (two) times daily. 08/07/19   [provider]  albuterol (VENTOLIN HFA) 108 (90 Base) MCG/ACT inhaler Inhale 1-2 puffs into the lungs 4 (four) times daily as needed for wheezing or shortness of breath.  07/08/19   [provider]  Apremilast (OTEZLA) 30 MG TABS Take 30 mg by mouth in the morning and at bedtime. 12/07/19   [provider]  aspirin EC 81 MG tablet Take 81 mg by mouth daily.    [provider]  Buprenorphine HCl (BELBUCA) 750 MCG FILM Place inside cheek.    [provider]  busPIRone (BUSPAR) 15 MG tablet Take 15 mg by mouth 3 (three) times daily. 07/15/19   [provider]  celecoxib (CELEBREX) 200 MG capsule Take 200 mg by mouth daily.  06/30/19   [provider]  Cholecalciferol (VITAMIN D-1000 MAX ST)  25 MCG (1000 UT) tablet Take 1,000 Units by mouth once a week.     [provider]  clonazePAM (KLONOPIN) 0.5 MG tablet Take 0.5 mg by mouth at bedtime.    [provider]  Desvenlafaxine Succinate ER 25 MG TB24 Take 25 mg by mouth 2 (two) times daily.    [provider]  diclofenac Sodium (VOLTAREN) 1 % GEL SMARTSIG:2 Gram(s) Topical 3 Times Daily PRN 02/10/20   [provider]  enoxaparin (LOVENOX) 100 MG/ML injection Inject 1 mL (100 mg total) into the skin every 12 (twelve) hours. 03/15/20   Runell Gess, MD  famotidine (PEPCID) 40 MG tablet Take 40 mg by mouth daily.    [provider]  folic acid (FOLVITE) 1 MG tablet Take by mouth. 02/02/19   [provider]  furosemide (LASIX) 40 MG tablet Take 1 tablet (40 mg total) by mouth daily. You may take an extra 20mg  as needed for volume overload. 07/23/19   Azalee Course, PA  gabapentin (NEURONTIN) 800 MG tablet Take 800 mg by mouth 3 (three) times daily.  02/09/20   [provider]  leflunomide (ARAVA) 20 MG tablet Take 20 mg by mouth daily.    [provider]  metoprolol succinate (TOPROL-XL) 50 MG 24 hr tablet Take 50 mg by mouth daily. 01/30/20   [provider]  nortriptyline (PAMELOR) 25 MG capsule Take 25 mg by mouth at bedtime.  10/24/18   [provider]  ondansetron (ZOFRAN) 4 MG tablet TAKE 1 TABLET BY MOUTH THREE TIMES DAILY 12/15/19   [provider]  Oxycodone HCl 10 MG TABS Take 10 mg by mouth every 4 (four) hours as needed (pain).  07/08/19   [provider]  penicillin v potassium (VEETID) 250 MG tablet Take 250 mg by mouth 2 (two) times a day.    [provider]  potassium chloride (KLOR-CON) 10 MEQ tablet Take 10 mEq by mouth daily. 02/04/20   [provider]  potassium chloride SA (KLOR-CON) 20 MEQ tablet Take 20 mEq by mouth daily. 07/29/19   [provider]  pravastatin (PRAVACHOL) 20 MG tablet Take 20 mg  by mouth daily.    [provider]  promethazine-dextromethorphan (PROMETHAZINE-DM) 6.25-15 MG/5ML syrup Take 5 mLs by mouth 4 (four) times daily as needed. Patient taking differently: Take 5 mLs by mouth 4 (four) times daily as needed for cough.  08/16/19   Worthy Rancher B, FNP  risperiDONE (RISPERDAL) 0.25 MG tablet Take 0.25 mg by mouth at bedtime. 01/15/20   [provider]  tiZANidine (ZANAFLEX) 4 MG tablet Take 4 mg by mouth 3 (three) times daily. 04/22/19   [provider]  warfarin (COUMADIN) 5 MG tablet Take 1 to 1.5 tablets by mouth daily as directed by coumadin clinic 03/08/20   Runell Gess, MD  XTAMPZA ER 36 MG C12A Take 1 capsule by mouth 2 (two) times daily. 02/11/20   [provider]    Allergies    Patient has no known allergies.  Review of Systems   Review of Systems  Constitutional: Negative for fever.  HENT: Negative for congestion.   Respiratory: Negative for cough and shortness of breath.   Cardiovascular: Negative for chest pain.  Gastrointestinal: Negative for abdominal pain, nausea and vomiting.  Musculoskeletal: Positive for arthralgias.  Neurological: Negative for loss of consciousness and headaches.  All other systems reviewed and are negative.   Physical Exam Updated Vital Signs BP 133/79 (BP Location: Right Arm)   Pulse 88   Temp 99 F (37.2 C) (Oral)   Resp 19   Ht 6\' 2"  (1.88 m)   Wt 95.3 kg   SpO2 100%   BMI 26.96 kg/m   Physical Exam Vitals and nursing note reviewed.  Constitutional:      General: He is not in acute distress.    Appearance: He is well-developed. He is not ill-appearing.  HENT:     Head: Normocephalic and atraumatic.     Right Ear: External ear normal.     Left Ear: External ear normal.     Nose: Nose normal.  Eyes:     Extraocular Movements: Extraocular movements intact.     Conjunctiva/sclera: Conjunctivae normal.     Pupils: Pupils are equal, round, and reactive to light.   Cardiovascular:     Rate and Rhythm: Normal rate and regular rhythm.     Heart sounds: No murmur heard.   Pulmonary:     Effort: Pulmonary effort is normal. No respiratory distress.     Breath sounds: Normal breath sounds.  Abdominal:  Palpations: Abdomen is soft.     Tenderness: There is no abdominal tenderness.  Musculoskeletal:        General: Tenderness present. Normal range of motion.     Cervical back: Neck supple.     Comments: Patient's surgical site left hip clean dry and intact.  Dressing had mild serosanguineous discharge.  No active drainage on inspection.  Skin:    General: Skin is warm and dry.  Neurological:     General: No focal deficit present.     Mental Status: He is alert.  Psychiatric:        Mood and Affect: Mood normal.        Behavior: Behavior normal.     ED Results / Procedures / Treatments   Labs (all labs ordered are listed, but only abnormal results are displayed) Labs Reviewed  COMPREHENSIVE METABOLIC PANEL - Abnormal; Notable for the following components:      Result Value   CO2 21 (*)    Glucose, Bld 107 (*)    Calcium 8.7 (*)    AST 14 (*)    All other components within normal limits  CBC WITH DIFFERENTIAL/PLATELET - Abnormal; Notable for the following components:   Hemoglobin 11.9 (*)    HCT 37.7 (*)    All other components within normal limits  LACTIC ACID, PLASMA    EKG None  Radiology No results found.  Procedures Procedures (including critical care time)  Medications Ordered in ED Medications  oxyCODONE (Oxy IR/ROXICODONE) immediate release tablet 10 mg (10 mg Oral Given 04/24/20 1825)    ED Course  I have reviewed the triage vital signs and the nursing notes.  Pertinent labs & imaging results that were available during my care of the patient were reviewed by me and considered in my medical decision making (see chart for details).    MDM Rules/Calculators/A&P                          Differential diagnosis:  Postop problem, surgical infection, sepsis  ED physician interpretation of labs: Patient's CBC, CMP, lactate without signs of acute infection  MDM: Patient is a 34 year old male with complex medical history with recent hip replacement presenting the ED with pain and increased drainage from surgical site with unremarkable lab work and stable vital signs requiring conversation with [facility] with patient found to have postop pain and increased drainage but no signs of sepsis appropriate for outpatient follow-up.  Patient vital signs stable, patient afebrile.  Patient physical exam remarkable for surgical site that is clean dry intact with no active drainage at this time.  Patient has no overt signs of infection at this time.  Additional imaging considered however patient has recently had surgery and will likely have postop changes.  As patient has no signs of sepsis, no SIRS criteria and unremarkable lab work patient is stable for discharge and outpatient follow-up.  [facility] was contacted, surgeon on-call reviewed patient's case and discussion was had about options.  As patient is stable and does not appear to have overt infection close outpatient follow-up with his primary surgeon is appropriate.  Patient given oral pain medication in the ED with improvement.  Patient has access to this medication at home as well as his chronic pain medication.  No further emergency medical intervention or care at this time.  Diagnosis, treatment and plan of care was discussed and agreed upon with patient.  Patient comfortable with  discharge at this time.   Key discharge instructions: You presented to the ED with acute onset of left hip pain as well as some increased drainage your surgical site from your hip replacement on 5/29.  Your lab work and vital signs are reassuring that you do not have a significant acute infection at this time.  However this can always change.  We called [facility]  orth[facility] surgery and based on your vital signs are presentation and they feel comfortable with you following up early next week with Dr. Melvyn Neth.  Please use information above to call Dr. Theadore Nan office for close follow-up.  If symptoms change, if pain worsens or you start having fevers fast heart rate or other signs of infection please return to the ED for further evaluation.  Continue your normal pain medication.   Final Clinical Impression(s) / ED Diagnoses Final diagnoses:  Left hip pain  Encounter for postoperative wound check    Rx / DC Orders ED Discharge Orders    None       Janeece Fitting, MD 04/25/20 [id number]    Gwyneth Sprout, MD 04/25/20 [id number]

## 2020-04-24 NOTE — ED Triage Notes (Signed)
Pt states he had L hip replacement on 5/29.  Today pain suddenly increased from 2/10 to 8/10.  Reports drainage at surgical site and he is concerned he has another infection.  Denies fever and chills.

## 2020-05-02 ENCOUNTER — Encounter: Payer: Self-pay | Admitting: Thoracic Surgery (Cardiothoracic Vascular Surgery)

## 2020-05-02 ENCOUNTER — Other Ambulatory Visit: Payer: Self-pay

## 2020-05-02 ENCOUNTER — Ambulatory Visit (INDEPENDENT_AMBULATORY_CARE_PROVIDER_SITE_OTHER): Payer: BC Managed Care – PPO | Admitting: Thoracic Surgery (Cardiothoracic Vascular Surgery)

## 2020-05-02 VITALS — BP 103/64 | HR 100 | Temp 97.7°F | Resp 20 | Ht 74.0 in | Wt 202.0 lb

## 2020-05-02 DIAGNOSIS — Z954 Presence of other heart-valve replacement: Secondary | ICD-10-CM

## 2020-05-02 NOTE — Patient Instructions (Signed)

## 2020-05-02 NOTE — Progress Notes (Signed)
[address]       [place] [address]             [phone]     CARDIOTHORACIC SURGERY OFFICE NOTE  Primary Cardiologist is Nanetta Batty, MD PCP is [facility]   HPI:  Patient is a 34 year old male with rheumatic mitral stenosis, chronic diastolic congestive heart failure, pulmonary hypertension, rheumatoid arthritis, and possible multiple sclerosis who returns the office todayfor follow-up status post minimally invasive mitral valve replacement using a mechanical prosthetic valve on May 14, 2019.  He was last seen here in our office on August 03, 2019.  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  He is actually scheduled for surgery again later today.  From a cardiac standpoint he has done very well.  He still gets short of breath with exertion but he states that he feels "much improved" in comparison with how he felt prior to his valve replacement.  He only gets short of breath with exertion and activity.  He no longer has any symptoms of severe shortness of breath, orthopnea, or dizzy spells.  He does not have any chest pain or chest tightness.  Most recent follow-up echocardiogram performed January 14, 2020 revealed normal left ventricular systolic function with ejection fraction estimated 55 to 60%.  Mechanical prosthetic valve in the mitral position was functioning normally with no mitral stenosis and no significant mitral regurgitation.  He reportedly has not had significant problems with warfarin anticoagulation other than the fact that it has led to some issues with bleeding during and following his multiple hip surgical procedures.   Current Outpatient Medications  Medication Sig Dispense Refill  . ADVAIR HFA 115-21 MCG/ACT inhaler Inhale 1 puff into the lungs 2 (two) times daily.    [name] Kitchen albuterol (VENTOLIN HFA) 108 (90 Base) MCG/ACT inhaler Inhale 1-2 puffs  into the lungs 4 (four) times daily as needed for wheezing or shortness of breath.     . Apremilast (OTEZLA) 30 MG TABS Take 30 mg by mouth in the morning and at bedtime.    [name] Kitchen aspirin EC 81 MG tablet Take 81 mg by mouth daily.    . Buprenorphine HCl (BELBUCA) 750 MCG FILM Place inside cheek.    . busPIRone (BUSPAR) 15 MG tablet Take 15 mg by mouth 3 (three) times daily.    . celecoxib (CELEBREX) 200 MG capsule Take 200 mg by mouth daily.     . Cholecalciferol (VITAMIN D-1000 MAX ST) 25 MCG (1000 UT) tablet Take 1,000 Units by mouth once a week.     . clonazePAM (KLONOPIN) 0.5 MG tablet Take 0.5 mg by mouth at bedtime.    [name] Kitchen Desvenlafaxine Succinate ER 25 MG TB24 Take 25 mg by mouth 2 (two) times daily.    . diclofenac Sodium (VOLTAREN) 1 % GEL SMARTSIG:2 Gram(s) Topical 3 Times Daily PRN    . enoxaparin (LOVENOX) 100 MG/ML injection Inject 1 mL (100 mg total) into the skin every 12 (twelve) hours. 20 mL 0  . folic acid (FOLVITE) 1 MG tablet Take by mouth.    . furosemide (LASIX) 40 MG tablet Take 1 tablet (40 mg total) by mouth daily. You may take an extra 20mg  as needed for volume overload. 45 tablet 3  . gabapentin (NEURONTIN) 800 MG tablet Take 800 mg by mouth 3 (three) times daily.    [name] Kitchen leflunomide (ARAVA) 20 MG  tablet Take 20 mg by mouth daily.    . metoprolol succinate (TOPROL-XL) 50 MG 24 hr tablet Take 50 mg by mouth daily.    . ondansetron (ZOFRAN) 4 MG tablet TAKE 1 TABLET BY MOUTH THREE TIMES DAILY    . Oxycodone HCl 10 MG TABS Take 10 mg by mouth every 4 (four) hours as needed (pain).     [name] Kitchen penicillin v potassium (VEETID) 250 MG tablet Take 250 mg by mouth 2 (two) times a day.    . potassium chloride (KLOR-CON) 10 MEQ tablet Take 10 mEq by mouth daily.    . pravastatin (PRAVACHOL) 20 MG tablet Take 20 mg by mouth daily.    . risperiDONE (RISPERDAL) 0.25 MG tablet Take 0.25 mg by mouth at bedtime.    [name] Kitchen tiZANidine (ZANAFLEX) 4 MG tablet Take 4 mg by mouth 3 (three) times daily.    [name] Kitchen  warfarin (COUMADIN) 5 MG tablet Take 1 to 1.5 tablets by mouth daily as directed by coumadin clinic 120 tablet 1  . XTAMPZA ER 36 MG C12A Take 1 capsule by mouth 2 (two) times daily.    . famotidine (PEPCID) 40 MG tablet Take 40 mg by mouth daily. (Patient not taking: Reported on 05/02/2020)    . nortriptyline (PAMELOR) 25 MG capsule Take 25 mg by mouth at bedtime.  (Patient not taking: Reported on 05/02/2020)     Current Facility-Administered Medications  Medication Dose Route Frequency Provider Last Rate Last Admin  . metoprolol tartrate (LOPRESSOR) tablet 25 mg  25 mg Oral BID Rexene Alberts, MD          Physical Exam:   BP 103/64   Pulse 100   Temp 97.7 F (36.5 C) (Skin)   Resp 20   Ht 6\' 2"  (1.88 m)   Wt 202 lb (91.6 kg)   SpO2 98% Comment: RA  BMI 25.94 kg/m   General:  Well-appearing  Chest:   Clear to auscultation with symmetrical breath sounds  CV:   Regular rate and rhythm with mechanical heart valve sounds  Incisions:  Completely healed  Abdomen:  Soft nontender  Extremities:  Warm and well-perfused  Diagnostic Tests:   ECHOCARDIOGRAM REPORT       Patient Name:  Darren Haynes Date of Exam: 01/14/2020  Medical Rec #: [id number]    Height:    74.0 in  Accession #:  [id number]   Weight:    222.4 lb  Date of Birth: [birth year]    BSA:     2.274 m  Patient Age:  34 years    BP:      127/74 mmHg  Patient Gender: M        HR:      92 bpm.  Exam Location: [facility]   Procedure: 2D Echo, Cardiac Doppler and Color Doppler   Indications:  I05.0 (ICD-10-CM) - Rheumatic mitral stenosis    History:    Patient has prior history of Echocardiogram examinations,  most         recent 06/23/2019. Bilateral lower extremity edema,Chronic         diastolic heart failure,GERD,Dyspnea on exertion,Mitral         stenosis,OSA (obstructive sleep apnea),S/P minimally  invasive          mitral valve replacement with mechanical valve-29 mm Sorin         [employer] bileaflet mechanical valve via right mini         thoracotomy approach,Tobacco use disorder,Rheumatic fever  (  From         Hx).    Sonographer:  Celesta Gentile RCS  Referring Phys: [phone] JONATHAN J BERRY   IMPRESSIONS    1. Left ventricular ejection fraction, by estimation, is 55 to 60%. The  left ventricle has normal function. The left ventricle has no regional  wall motion abnormalities. Left ventricular diastolic parameters are  indeterminate.  2. Right ventricular systolic function is normal. The right ventricular  size is normal.  3. Left atrial size was mild to moderately dilated.  4. Sorin [employer] Bileaflet Mechanical Valve (size 29 mm) is in the MV  position.. The mitral valve has been repaired/replaced. No evidence of  mitral valve regurgitation. No evidence of mitral stenosis.  5. The aortic valve is tricuspid. Aortic valve regurgitation is mild. No  aortic stenosis is present.   FINDINGS  Left Ventricle: Left ventricular ejection fraction, by estimation, is 55  to 60%. The left ventricle has normal function. The left ventricle has no  regional wall motion abnormalities. The left ventricular internal cavity  size was normal in size. There is  no left ventricular hypertrophy. Left ventricular diastolic parameters  are indeterminate.   Right Ventricle: The right ventricular size is normal. No increase in  right ventricular wall thickness. Right ventricular systolic function is  normal.   Left Atrium: Left atrial size was mild to moderately dilated.   Right Atrium: Right atrial size was normal in size.   Pericardium: There is no evidence of pericardial effusion.   Mitral Valve: Sorin [employer] Bileaflet Mechanical Valve (size 29 mm) is  in the MV position. The mitral valve has been repaired/replaced. No  evidence of mitral valve regurgitation. No  evidence of mitral valve  stenosis. MV peak gradient, 9.1 mmHg. The  mean mitral valve gradient is 3.5 mmHg.   Tricuspid Valve: The tricuspid valve is normal in structure. Tricuspid  valve regurgitation is not demonstrated. No evidence of tricuspid  stenosis.   Aortic Valve: The aortic valve is tricuspid. Aortic valve regurgitation is  mild. No aortic stenosis is present. Aortic valve mean gradient measures  4.0 mmHg. Aortic valve peak gradient measures 6.7 mmHg. Aortic valve area,  by VTI measures 2.48 cm.   Pulmonic Valve: The pulmonic valve was not well visualized. Pulmonic valve  regurgitation is not visualized. No evidence of pulmonic stenosis.   Aorta: The aortic root is normal in size and structure.   IAS/Shunts: No atrial level shunt detected by color flow Doppler.     LEFT VENTRICLE  PLAX 2D  LVIDd:     4.83 cm   Diastology  LVIDs:     3.30 cm   LV e' lateral:  8.05 cm/s  LV PW:     0.97 cm   LV E/e' lateral: 16.3  LV IVS:    0.84 cm   LV e' medial:  7.40 cm/s  LVOT diam:   2.20 cm   LV E/e' medial: 17.8  LV SV:     70  LV SV Index:  31  LVOT Area:   3.80 cm    LV Volumes (MOD)  LV vol d, MOD A2C: 71.9 ml  LV vol d, MOD A4C: 92.1 ml  LV vol s, MOD A2C: 29.6 ml  LV vol s, MOD A4C: 32.8 ml  LV SV MOD A2C:   42.3 ml  LV SV MOD A4C:   92.1 ml  LV SV MOD BP:   50.8 ml   RIGHT VENTRICLE  RV S  prime:   12.20 cm/s  TAPSE (M-mode): 2.4 cm   LEFT ATRIUM       Index    RIGHT ATRIUM      Index  LA diam:    3.60 cm 1.58 cm/m RA Area:   13.30 cm  LA Vol (A2C):  98.9 ml 43.50 ml/m RA Volume:  35.40 ml 15.57 ml/m  LA Vol (A4C):  57.2 ml 25.16 ml/m  LA Biplane Vol: 79.2 ml 34.83 ml/m  AORTIC VALVE  AV Area (Vmax):  2.55 cm  AV Area (Vmean):  2.59 cm  AV Area (VTI):   2.48 cm  AV Vmax:      129.00 cm/s  AV Vmean:     86.300 cm/s  AV VTI:      0.281 m  AV Peak  Grad:   6.7 mmHg  AV Mean Grad:   4.0 mmHg  LVOT Vmax:     86.70 cm/s  LVOT Vmean:    58.800 cm/s  LVOT VTI:     0.183 m  LVOT/AV VTI ratio: 0.65    AORTA  Ao Root diam: 3.10 cm   MITRAL VALVE  MV Area (PHT): 2.91 cm   SHUNTS  MV Peak grad: 9.1 mmHg   Systemic VTI: 0.18 m  MV Mean grad: 3.5 mmHg   Systemic Diam: 2.20 cm  MV Vmax:    1.50 m/s  MV Vmean:   81.1 cm/s  MV Decel Time: 261 msec  MV E velocity: 131.50 cm/s  MV A velocity: 110.00 cm/s  MV E/A ratio: 1.20   Dina Rich MD  Electronically signed by Dina Rich MD  Signature Date/Time: 01/14/2020/12:16:50 PM    PORTABLE CHEST 1 VIEW  COMPARISON:  Chest radiograph 10/31/2019  FINDINGS: Stable cardiomediastinal contours status post valvular replacement. Persistent elevation of the right hemidiaphragm. The lungs are clear. No pneumothorax or significant pleural effusion. No acute finding in the visualized skeleton.  IMPRESSION: No acute cardiopulmonary process.   Electronically Signed   By: Emmaline Kluver M.D.   On: 04/10/2020 18:38     Impression:  Patient is doing well approximately 1 year status post mitral valve replacement using a mechanical prosthetic valve.  Most recent follow-up echocardiogram demonstrates normal functioning mechanical valve in the mitral position with normal left ventricular function.   I have personally reviewed the patient's most recent portable chest x-ray performed Apr 10, 2020 and compared it with his last chest x-ray performed prior to mitral valve replacement.  Although the diaphragm is slightly higher on the right side, there is no sign to suggest diaphragmatic paralysis and the patient has good breath sounds at the right lung base with normal respiratory excursion.  Sniff test could be performed under fluoroscopy but I do not feel there were any signs of neither partial nor complete paralysis of the right  hemidiaphragm.    Plan:  Patient will continue to follow-up intermittently with Dr. Allyson Sabal.  We have not recommended any change in the patient's current medications.  However, in the absence of other medical problems which might require antiplatelet therapy, there are no indications for anticoagulation using aspirin as long as the patient remains therapeutic on warfarin.   The patient has been reminded regarding the importance of dental hygiene and the lifelong need for antibiotic prophylaxis for all dental cleanings and other related invasive procedures.   I spent in excess of 15 minutes during the conduct of this office consultation and >50% of this time involved direct face-to-face encounter with  the patient for counseling and/or coordination of their care.    Salvatore Decent. Cornelius Moras, MD 05/02/2020 10:28 AM

## 2020-05-05 LAB — POCT INR: INR: 1.8 — AB (ref 2.0–3.0)

## 2020-05-06 ENCOUNTER — Ambulatory Visit (INDEPENDENT_AMBULATORY_CARE_PROVIDER_SITE_OTHER): Payer: BC Managed Care – PPO | Admitting: Pharmacist Clinician (PhC)/ Clinical Pharmacy Specialist

## 2020-05-06 DIAGNOSIS — Z7901 Long term (current) use of anticoagulants: Secondary | ICD-10-CM | POA: Diagnosis not present

## 2020-05-06 DIAGNOSIS — Z952 Presence of prosthetic heart valve: Secondary | ICD-10-CM | POA: Diagnosis not present

## 2020-05-06 DIAGNOSIS — I050 Rheumatic mitral stenosis: Secondary | ICD-10-CM

## 2020-05-06 DIAGNOSIS — Z954 Presence of other heart-valve replacement: Secondary | ICD-10-CM

## 2020-05-11 NOTE — Telephone Encounter (Signed)
Dr Allyson Sabal talked to the doctor

## 2020-05-18 ENCOUNTER — Telehealth: Payer: Self-pay | Admitting: Cardiovascular Disease

## 2020-05-18 ENCOUNTER — Telehealth: Payer: Self-pay | Admitting: *Deleted

## 2020-05-18 ENCOUNTER — Telehealth: Payer: BC Managed Care – PPO | Admitting: Cardiology

## 2020-05-18 NOTE — Telephone Encounter (Signed)
LMOM to call and schedule f/u appt with Dr. Allyson Sabal or Franky Macho

## 2020-05-18 NOTE — Telephone Encounter (Signed)
Left a message for the patient to call back for his virtual appointment this morning with Corine Shelter, PA. He did just have surgery so the appointment may need to be rescheduled. Message sent to scheduling.

## 2020-05-19 ENCOUNTER — Ambulatory Visit (INDEPENDENT_AMBULATORY_CARE_PROVIDER_SITE_OTHER): Payer: BC Managed Care – PPO | Admitting: Cardiology

## 2020-05-19 DIAGNOSIS — Z954 Presence of other heart-valve replacement: Secondary | ICD-10-CM | POA: Diagnosis not present

## 2020-05-19 DIAGNOSIS — Z7901 Long term (current) use of anticoagulants: Secondary | ICD-10-CM

## 2020-05-19 DIAGNOSIS — I050 Rheumatic mitral stenosis: Secondary | ICD-10-CM

## 2020-05-19 LAB — POCT INR: INR: 5.3 — AB (ref 2.0–3.0)

## 2020-05-19 NOTE — Patient Instructions (Signed)
Description   Hold 2 doses than begin taking 5 mg once daily.  Recheck INR in 1 week

## 2020-05-20 ENCOUNTER — Ambulatory Visit: Payer: BC Managed Care – PPO | Admitting: Cardiovascular Disease

## 2020-05-31 ENCOUNTER — Encounter (HOSPITAL_COMMUNITY): Payer: Self-pay | Admitting: *Deleted

## 2020-05-31 NOTE — Progress Notes (Signed)
Patient contacted pulmonary rehab for scheduling. Pt who is well known by cardiac rehab staff from prior participation in 2020 was advised to participate in pulmonary rehab by his pulmonologist at [facility] for restrictive lung disease.  Clinical review of pt initial consult appt on 05/19/20  Pulmonary office note.  Pt with Covid Risk Score -3. Pt appropriate for scheduling for Pulmonary rehab once we receive the signed MD order form and he completes his follow up appt with orthopedic surgeon on 7/23.  Will forward to support staff for verification of insurance eligibility/benefits with pt consent. Alanson Aly, BSN Cardiac and Emergency planning/management officer

## 2020-06-05 LAB — POCT INR: INR: 4.5 — AB (ref 2.0–3.0)

## 2020-06-06 ENCOUNTER — Telehealth (HOSPITAL_COMMUNITY): Payer: Self-pay

## 2020-06-06 LAB — POCT INR

## 2020-06-06 NOTE — Telephone Encounter (Signed)
Called patient to see if he is interested in the Pulmonary Rehab Program. Patient expressed interest. Explained scheduling process and went over insurance, patient verbalized understanding. Someone from our pulmonary rehab staff will contact pt at a later time. 

## 2020-06-06 NOTE — Telephone Encounter (Signed)
Pt insurance is active and benefits verified through [employer] Co-pay 0, DED 0/0 met, out of pocket $700/$700 met, co-insurance 40%. no pre-authorization required. Passport, Mark/[employer] 06/06/2020_0 :13am, REF# [id number]

## 2020-06-08 ENCOUNTER — Telehealth: Payer: Self-pay

## 2020-06-08 NOTE — Telephone Encounter (Signed)
lmomed the pt to test themself coumadin and call us w/results so that we may dose asap

## 2020-06-09 ENCOUNTER — Ambulatory Visit (INDEPENDENT_AMBULATORY_CARE_PROVIDER_SITE_OTHER): Payer: BC Managed Care – PPO | Admitting: Cardiovascular Disease

## 2020-06-09 DIAGNOSIS — Z954 Presence of other heart-valve replacement: Secondary | ICD-10-CM | POA: Diagnosis not present

## 2020-06-09 DIAGNOSIS — I050 Rheumatic mitral stenosis: Secondary | ICD-10-CM

## 2020-06-13 ENCOUNTER — Other Ambulatory Visit (HOSPITAL_COMMUNITY): Payer: BC Managed Care – PPO

## 2020-06-13 ENCOUNTER — Ambulatory Visit (INDEPENDENT_AMBULATORY_CARE_PROVIDER_SITE_OTHER): Payer: BC Managed Care – PPO | Admitting: Cardiology

## 2020-06-13 DIAGNOSIS — Z954 Presence of other heart-valve replacement: Secondary | ICD-10-CM | POA: Diagnosis not present

## 2020-06-13 DIAGNOSIS — I050 Rheumatic mitral stenosis: Secondary | ICD-10-CM

## 2020-06-13 LAB — POCT INR: INR: 7.6 — AB (ref 2.0–3.0)

## 2020-06-14 ENCOUNTER — Encounter: Payer: Self-pay | Admitting: Cardiovascular Disease

## 2020-06-14 ENCOUNTER — Other Ambulatory Visit: Payer: Self-pay

## 2020-06-14 ENCOUNTER — Ambulatory Visit (INDEPENDENT_AMBULATORY_CARE_PROVIDER_SITE_OTHER): Payer: BC Managed Care – PPO | Admitting: Cardiovascular Disease

## 2020-06-14 DIAGNOSIS — I050 Rheumatic mitral stenosis: Secondary | ICD-10-CM | POA: Diagnosis not present

## 2020-06-14 NOTE — Patient Instructions (Signed)
Medication Instructions:  Your physician recommends that you continue on your current medications as directed. Please refer to the Current Medication list given to you today.  *If you need a refill on your cardiac medications before your next appointment, please call your pharmacy*  Lab Work: NONE ordered at this time of appointment   If you have labs (blood work) drawn today and your tests are completely normal, you will receive your results only by: [name] Kitchen MyChart Message (if you have MyChart) OR . A paper copy in the mail If you have any lab test that is abnormal or we need to change your treatment, we will call you to review the results.  Testing/Procedures: Your physician has requested that you have an echocardiogram. Echocardiography is a painless test that uses sound waves to create images of your heart. It provides your doctor with information about the size and shape of your heart and how well your heart's chambers and valves are working. This procedure takes approximately one hour. There are no restrictions for this procedure.   Please schedule for March 2022  Follow-Up: At [facility], you and your health needs are our priority.  As part of our continuing mission to provide you with exceptional heart care, we have created designated Provider Care Teams.  These Care Teams include your primary Cardiologist (physician) and Advanced Practice Providers (APPs -  Physician Assistants and Nurse Practitioners) who all work together to provide you with the care you need, when you need it.   Your next appointment:   6 month(s) 12 month(s)   The format for your next appointment:   In Person In Person   Provider:   Azalee Course, PA-C Nanetta Batty, MD  Other Instructions

## 2020-06-14 NOTE — Assessment & Plan Note (Addendum)
History of rheumatic mitral stenosis status post balloon mitral valvuloplasty by Dr. Regino Schultze 12/25/2018 with improvement in his symptoms however soon thereafter he developed restenosis and ultimately underwent mitral valve replacement by Dr. Cornelius Moras with a Sorin [employer] bileaflet mechanical valve (29 mm).  He just saw Dr. Cornelius Moras back 2 months ago when he was doing well.  His last 2D echocardiogram performed 01/15/2020 revealing a well-functioning mitral mechanical prosthesis with normal LV function.  He is on Coumadin anticoagulation and has had trouble keeping his INR is within the therapeutic range.  We will recheck a 2D echo in March of next year.

## 2020-06-14 NOTE — Progress Notes (Signed)
06/14/2020 Darren Haynes   Oct 06, 1986  [id number]  [facility], [facility] Cardiologist: Runell Gess MD FACP, [place], [place], [place]  HPI:  Darren Haynes is a 34 y.o.  thinappearing married Caucasian male father 2 children who did work in Aeronautical engineer but has not worked in the last 9 months because of progressive dyspnea on exertion. He was referred by Dr. Hanley Hays for evaluation of symptomatic mitral stenosis. I last saw him in the office  11/17/2019.Thereis no history of rheumatic fever. He has no other cardiac risk factors. He is had progressive dyspnea for the last 9 months with some nonspecific aches and pains. Is being worked up by rheumatologist. Echo performed 11/21/2018 revealed normal LV systolic function, a peak mitral gradient of 47 mmHg with a mean of 29 mmHg and mitral valve area 1.5 cm with a PA pressure of 69 mmHg. He does have severe dyspnea and lower extreme edema. He is on oral diuretic.He presented today for transesophageal echocardiogram performed Dr. Royann Shivers which showed classic rheumatic mitral stenosis with a mean gradient of 15 mmHg.  I did right left heart cath revealing a significant mitral valve gradient,and normal coronary arteries.Ireferred him to Dr. Regino Schultze at [facility] who performed successful mitral balloon valvuloplasty with almost immediate improvement in his pulmonary artery pressures. 12/25/2018.Hefeltclinically improved.  Because of increasing symptoms of mitral stenosis I referred him to Dr.Owenwho ultimately performed mitral valve replacement with a Sorin [employer] bileaflet mechanical valve (29 mm) done minimally invasively on 05/14/2019.He has done well postoperatively except for volume overload controlled with oral diuretics and tachycardia.  I did obtain a 2D echo 06/23/2019 revealed normal LV size and function, no evidence of pericardial effusion with a well-functioning aortic mechanical prosthesis.  An event monitor performed 05/25/2019 showed sinus rhythm/sinus tachycardia with occasional PVCs.   He has seen Dr.Owenin the office as well as how [employer]. His 2D echo did reveal normal LV size and function with a well-functioning aortic mechanical prosthesis and no evidence of pericardial effusion. His palpitations have improved as well. His dyspnea is markedly improved in addition on oral diuretics.  He underwent left total hip replacement tachycardia [facility] which has had some complications and infection.  He is walking with crutches.  Since I saw him 8 months ago he continues to do well.  He is gotten progressively stronger shortness of breath has improved.  A 2D echo performed in March showed a well-functioning mitral mechanical prosthesis normal LV function.  His INRs have been difficult to control however.   Current Meds  Medication Sig  . ADVAIR HFA 115-21 MCG/ACT inhaler Inhale 1 puff into the lungs 2 (two) times daily.  [name] Kitchen albuterol (VENTOLIN HFA) 108 (90 Base) MCG/ACT inhaler Inhale 1-2 puffs into the lungs 4 (four) times daily as needed for wheezing or shortness of breath.   . Apremilast (OTEZLA) 30 MG TABS Take 30 mg by mouth in the morning and at bedtime.  [name] Kitchen aspirin EC 81 MG tablet Take 81 mg by mouth daily.  . Buprenorphine HCl (BELBUCA) 750 MCG FILM Place inside cheek.  . busPIRone (BUSPAR) 15 MG tablet Take 15 mg by mouth 3 (three) times daily.  . celecoxib (CELEBREX) 200 MG capsule Take 200 mg by mouth daily.   . Cholecalciferol (VITAMIN D-1000 MAX ST) 25 MCG (1000 UT) tablet Take 1,000 Units by mouth once a week.   . clonazePAM (KLONOPIN) 0.5 MG tablet Take 0.5 mg by mouth at bedtime.  [name] Kitchen  Desvenlafaxine Succinate ER 25 MG TB24 Take 25 mg by mouth 2 (two) times daily.  . diclofenac Sodium (VOLTAREN) 1 % GEL SMARTSIG:2 Gram(s) Topical 3 Times Daily PRN  . folic acid (FOLVITE) 1 MG tablet Take by mouth.  . furosemide (LASIX) 40 MG tablet Take 1 tablet (40  mg total) by mouth daily. You may take an extra 20mg  as needed for volume overload.  . gabapentin (NEURONTIN) 800 MG tablet Take 800 mg by mouth 3 (three) times daily.  . hydroxychloroquine (PLAQUENIL) 200 MG tablet Take by mouth daily.  leflunomide (ARAVA) 20 MG tablet Take 20 mg by mouth daily.  . metoprolol succinate (TOPROL-XL) 50 MG 24 hr tablet Take 50 mg by mouth daily.  . nortriptyline (PAMELOR) 25 MG capsule Take 25 mg by mouth at bedtime.   [name] Kitchen omeprazole (PRILOSEC) 40 MG capsule Take 40 mg by mouth daily.  . ondansetron (ZOFRAN) 4 MG tablet TAKE 1 TABLET BY MOUTH THREE TIMES DAILY  . Oxycodone HCl 10 MG TABS Take 10 mg by mouth every 4 (four) hours as needed (pain).   [name] Kitchen penicillin v potassium (VEETID) 250 MG tablet Take 250 mg by mouth 2 (two) times a day.  . potassium chloride (KLOR-CON) 10 MEQ tablet Take 10 mEq by mouth daily.  . pravastatin (PRAVACHOL) 20 MG tablet Take 20 mg by mouth daily.  . risperiDONE (RISPERDAL) 0.25 MG tablet Take 0.25 mg by mouth at bedtime.  [name] Kitchen tiZANidine (ZANAFLEX) 4 MG tablet Take 4 mg by mouth 3 (three) times daily.  [name] Kitchen warfarin (COUMADIN) 5 MG tablet Take 1 to 1.5 tablets by mouth daily as directed by coumadin clinic  . XTAMPZA ER 36 MG C12A Take 1 capsule by mouth 2 (two) times daily.  . [DISCONTINUED] enoxaparin (LOVENOX) 100 MG/ML injection Inject 1 mL (100 mg total) into the skin every 12 (twelve) hours.  . [DISCONTINUED] famotidine (PEPCID) 40 MG tablet Take 40 mg by mouth daily.    Current Facility-Administered Medications for the 06/14/20 encounter (Office Visit) with 08/14/20, MD  Medication  . metoprolol tartrate (LOPRESSOR) tablet 25 mg     No Known Allergies  Social History   Socioeconomic History  . Marital status: Married    Spouse name: Not on file  . Number of children: Not on file  . Years of education: Not on file  . Highest education level: Not on file  Occupational History  . Not on file  Tobacco Use  . Smoking  status: Never Smoker  . Smokeless tobacco: Current User    Types: Snuff  Vaping Use  . Vaping Use: Never used  Substance and Sexual Activity  . Alcohol use: No  . Drug use: No  . Sexual activity: Not on file  Other Topics Concern  . Not on file  Social History Narrative  . Not on file   Social Determinants of Health   Financial Resource Strain:   . Difficulty of Paying Living Expenses:   Food Insecurity:   . Worried About Runell Gess in the Last Year:   . Programme researcher, broadcasting/film/video in the Last Year:   Transportation Needs:   . Barista (Medical):   Freight forwarder Lack of Transportation (Non-Medical):   Physical Activity:   . Days of Exercise per Week:   . Minutes of Exercise per Session:   Stress:   . Feeling of Stress :   Social Connections:   . Frequency of Communication with Friends and Family:   .  Frequency of Social Gatherings with Friends and Family:   . Attends Religious Services:   . Active Member of Clubs or Organizations:   . Attends Banker Meetings:   [name] Kitchen Marital Status:   Intimate Partner Violence:   . Fear of Current or Ex-Partner:   . Emotionally Abused:   [name] Kitchen Physically Abused:   . Sexually Abused:      Review of Systems: General: negative for chills, fever, night sweats or weight changes.  Cardiovascular: negative for chest pain, dyspnea on exertion, edema, orthopnea, palpitations, paroxysmal nocturnal dyspnea or shortness of breath Dermatological: negative for rash Respiratory: negative for cough or wheezing Urologic: negative for hematuria Abdominal: negative for nausea, vomiting, diarrhea, bright red blood per rectum, melena, or hematemesis Neurologic: negative for visual changes, syncope, or dizziness All other systems reviewed and are otherwise negative except as noted above.    Blood pressure 130/72, pulse 90, height 6\' 2"  (1.88 m), weight 200 lb 3.2 oz (90.8 kg), SpO2 98 %.  General appearance: alert and no distress Neck: no  adenopathy, no carotid bruit, no JVD, supple, symmetrical, trachea midline and thyroid not enlarged, symmetric, no tenderness/mass/nodules Lungs: clear to auscultation bilaterally Heart: Crisp mitral mechanical valve sounds Extremities: extremities normal, atraumatic, no cyanosis or edema Pulses: 2+ and symmetric Skin: Skin color, texture, turgor normal. No rashes or lesions Neurologic: Alert and oriented X 3, normal strength and tone. Normal symmetric reflexes. Normal coordination and gait  EKG not performed today  ASSESSMENT AND PLAN:   Mitral stenosis History of rheumatic mitral stenosis status post balloon mitral valvuloplasty by Dr. Regino Schultze 12/25/2018 with improvement in his symptoms however soon thereafter he developed restenosis and ultimately underwent mitral valve replacement by Dr. Cornelius Moras with a Sorin [employer] bileaflet mechanical valve (29 mm).  He just saw Dr. Cornelius Moras back 2 months ago when he was doing well.  His last 2D echocardiogram performed 01/15/2020 revealing a well-functioning mitral mechanical prosthesis with normal LV function.  He is on Coumadin anticoagulation and has had trouble keeping his INR is within the therapeutic range.  We will recheck a 2D echo in March of next year.      Runell Gess MD FACP,FACC,FAHA, [facility] 06/14/2020 5:29 PM

## 2020-06-15 ENCOUNTER — Other Ambulatory Visit: Payer: Self-pay | Admitting: Cardiovascular Disease

## 2020-06-16 ENCOUNTER — Ambulatory Visit (INDEPENDENT_AMBULATORY_CARE_PROVIDER_SITE_OTHER): Payer: BC Managed Care – PPO | Admitting: Pharmacist Clinician (PhC)/ Clinical Pharmacy Specialist

## 2020-06-16 DIAGNOSIS — Z7901 Long term (current) use of anticoagulants: Secondary | ICD-10-CM | POA: Diagnosis not present

## 2020-06-16 DIAGNOSIS — I050 Rheumatic mitral stenosis: Secondary | ICD-10-CM

## 2020-06-16 DIAGNOSIS — Z954 Presence of other heart-valve replacement: Secondary | ICD-10-CM

## 2020-06-16 LAB — POCT INR: INR: 1.4 — AB (ref 2.0–3.0)

## 2020-06-23 LAB — POCT INR: INR: 2.5 (ref 2.0–3.0)

## 2020-06-24 ENCOUNTER — Ambulatory Visit
Admission: EM | Admit: 2020-06-24 | Discharge: 2020-06-24 | Disposition: A | Discharge status: Home / Self Care | Payer: BC Managed Care – PPO

## 2020-06-24 ENCOUNTER — Other Ambulatory Visit: Payer: Self-pay

## 2020-06-24 NOTE — ED Notes (Signed)
Patient is being discharged from the Urgent Care and sent to the Emergency Department via pov . Per K. Avegno, patient is in need of higher level of care due to pt states he needs blood cultures done asap. Patient is aware and verbalizes understanding of plan of care. There were no vitals filed for this visit.

## 2020-06-24 NOTE — ED Triage Notes (Signed)
Pt needs to have blood cultures drawn per his pcp

## 2020-06-27 ENCOUNTER — Telehealth: Payer: Self-pay

## 2020-06-27 ENCOUNTER — Ambulatory Visit (INDEPENDENT_AMBULATORY_CARE_PROVIDER_SITE_OTHER): Payer: BC Managed Care – PPO | Admitting: Internal Medicine

## 2020-06-27 DIAGNOSIS — Z954 Presence of other heart-valve replacement: Secondary | ICD-10-CM | POA: Diagnosis not present

## 2020-06-27 DIAGNOSIS — Z7901 Long term (current) use of anticoagulants: Secondary | ICD-10-CM | POA: Diagnosis not present

## 2020-06-27 NOTE — Patient Instructions (Signed)
August/17: Last dose of warfarin.  August/18: No warfarin or (enoxaparin) Lovenox.  August/19: Inject enoxaparin 100mg  in the fatty abdominal tissue at least 2 inches from the belly button twice a day about 12 hours apart, 8am and 8pm rotate sites. No warfarin.  August/20: Inject enoxaparin in the fatty tissue every 12 hours, 8am and 8pm. No warfarin.  August/21: Inject enoxaparin in the fatty tissue every 12 hours, 8am and 8pm. No warfarin.  August/22: Inject enoxaparin in the fatty tissue in the morning ONLY. AT least  24 hours prior to appointment  (No PM dose). No Coumadin.  August/23/21: Procedure Day - No Lovenox - Resume Coumadin in the evening or as directed by doctor   August/24: Resume enoxaparin inject in the fatty tissue every 12 hours and take warfarin 5mg   August/25: Inject enoxaparin in the fatty tissue every 12 hours and take warfarin 7.5mg   August/26: Inject enoxapsrin in the fatty tissue every 12 hours and take warfarin 7.5mg   August/27: Check INR and call coumadin clinic at [phone] before 4:30pm

## 2020-06-27 NOTE — Telephone Encounter (Signed)
See addendum to coumadin clinic dated 8/16. Patient scheduled for epidural injection at [facility] on August/23. Bridging plan sent via myChart.  August/17: Last dose of warfarin.  August/18: No warfarin or (enoxaparin) Lovenox.  August/19: Inject enoxaparin 100mg  in the fatty abdominal tissue at least 2 inches from the belly button twice a day about 12 hours apart, 8am and 8pm rotate sites. No warfarin.  August/20: Inject enoxaparin in the fatty tissue every 12 hours, 8am and 8pm. No warfarin.  August/21: Inject enoxaparin in the fatty tissue every 12 hours, 8am and 8pm. No warfarin.  August/22: Inject enoxaparin in the fatty tissue in the morning ONLY. AT least  24 hours prior to appointment  (No PM dose). No Coumadin.  August/23/21: Procedure Day - No Lovenox - Resume Coumadin in the evening or as directed by doctor   August/24: Resume enoxaparin inject in the fatty tissue every 12 hours and take warfarin 5mg   August/25: Inject enoxaparin in the fatty tissue every 12 hours and take warfarin 7.5mg   August/26: Inject enoxapsrin in the fatty tissue every 12 hours and take warfarin 7.5mg   August/27: Check INR and call coumadin clinic at [phone] before 4:30pm

## 2020-06-27 NOTE — Telephone Encounter (Signed)
Pt called to inform us that he will be starting steroid injections on 07/04/20 and will need to be placed on lovenox shots. Will send to the [facility] anticoag pool for someone to further assist

## 2020-07-01 ENCOUNTER — Other Ambulatory Visit (HOSPITAL_COMMUNITY): Payer: BC Managed Care – PPO

## 2020-07-01 IMAGING — DX DG CHEST 1V PORT
1 series · 1 of 1 positions shown · non-contrast
Comparison: Chest radiograph 10/31/2019

CLINICAL DATA: C/O chest pressure, weakness, SOB since yesterday,
worsening today. Post hip replacement sx X 2 weeks ago. Also sx
history of s/p min invasive mitral valve. Hx of mitral valve
stenosis, heart murmur, CHF

EXAM:
PORTABLE CHEST 1 VIEW

[chest ap]
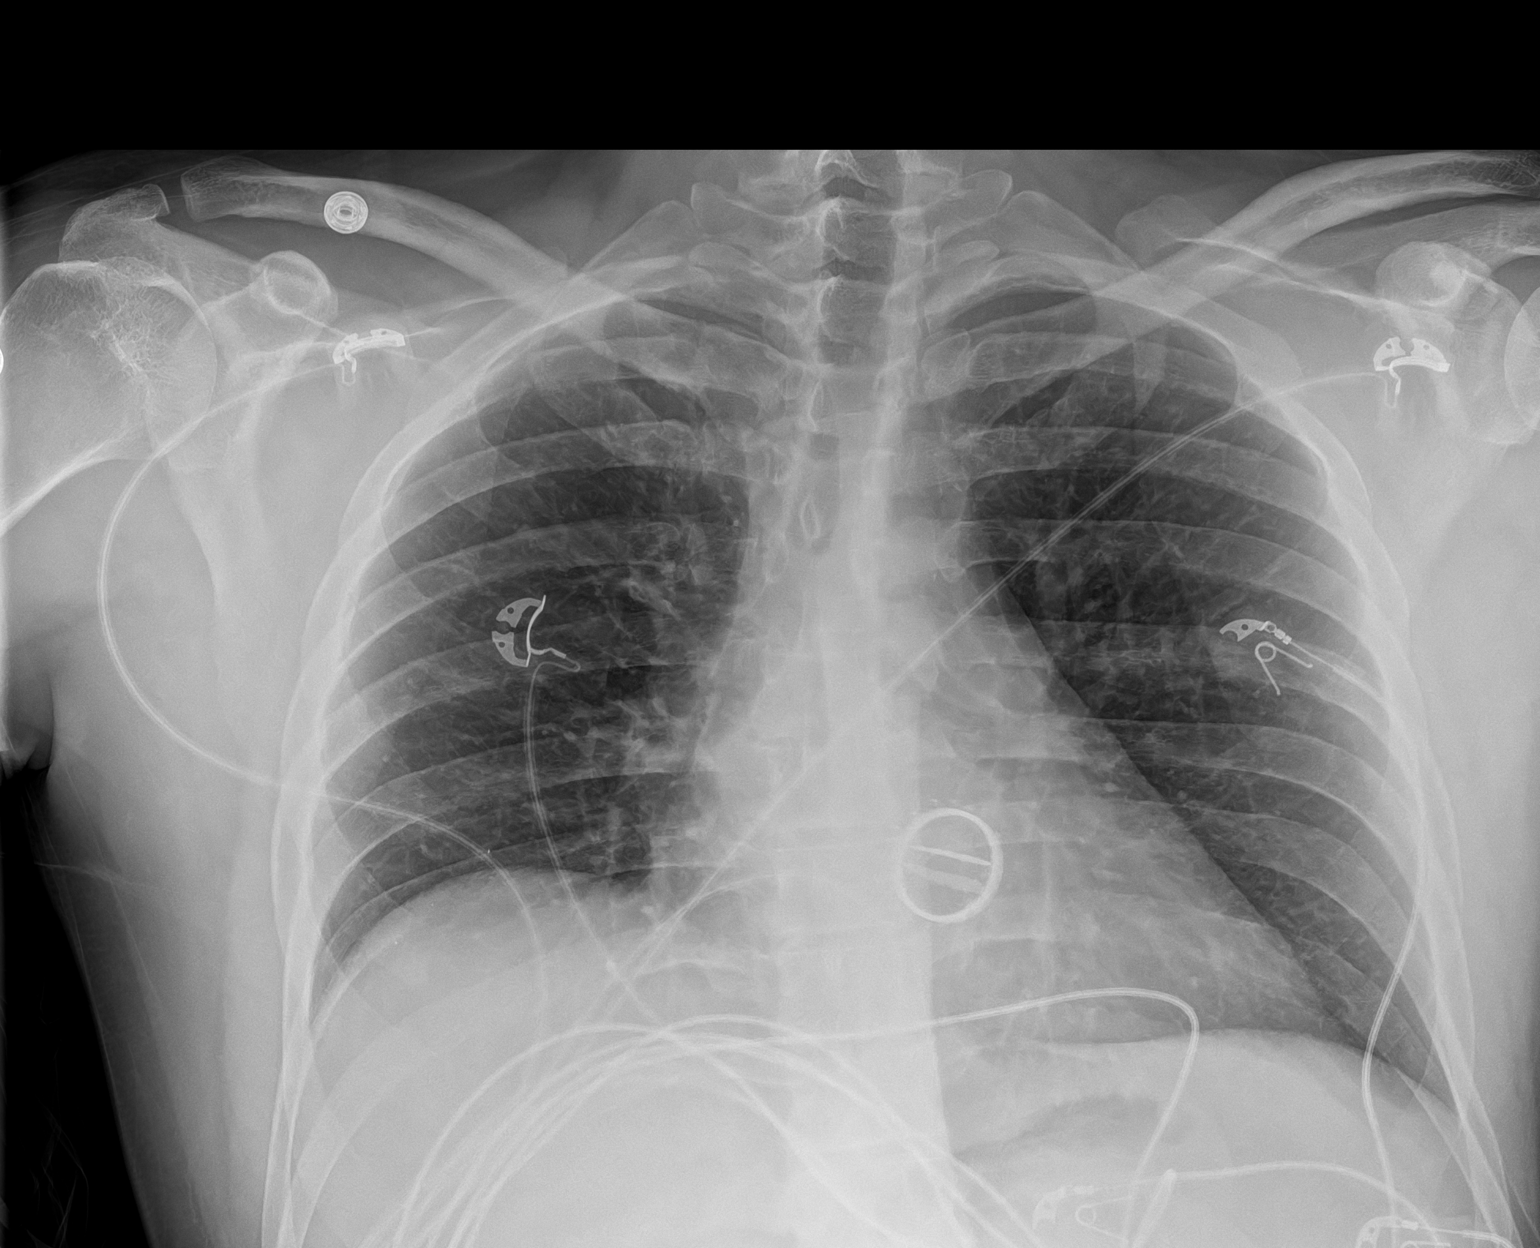

[1 of 1 positions shown; findings below may reference images not displayed]

FINDINGS: Stable cardiomediastinal contours status post valvular replacement.
Persistent elevation of the right hemidiaphragm. The lungs are
clear. No pneumothorax or significant pleural effusion. No acute
finding in the visualized skeleton.
IMPRESSION: No acute cardiopulmonary process.

## 2020-07-07 ENCOUNTER — Ambulatory Visit (INDEPENDENT_AMBULATORY_CARE_PROVIDER_SITE_OTHER): Payer: BC Managed Care – PPO | Admitting: Pharmacist

## 2020-07-07 DIAGNOSIS — Z7901 Long term (current) use of anticoagulants: Secondary | ICD-10-CM | POA: Diagnosis not present

## 2020-07-07 DIAGNOSIS — Z954 Presence of other heart-valve replacement: Secondary | ICD-10-CM

## 2020-07-07 LAB — POCT INR: INR: 1.8 — AB (ref 2.0–3.0)

## 2020-07-08 LAB — POCT INR: INR: 2.5 (ref 2.0–3.0)

## 2020-07-11 ENCOUNTER — Ambulatory Visit (INDEPENDENT_AMBULATORY_CARE_PROVIDER_SITE_OTHER): Payer: BC Managed Care – PPO | Admitting: Pharmacist

## 2020-07-11 DIAGNOSIS — Z7901 Long term (current) use of anticoagulants: Secondary | ICD-10-CM | POA: Diagnosis not present

## 2020-07-11 DIAGNOSIS — Z954 Presence of other heart-valve replacement: Secondary | ICD-10-CM

## 2020-07-14 ENCOUNTER — Telehealth (HOSPITAL_COMMUNITY): Payer: Self-pay

## 2020-07-20 ENCOUNTER — Telehealth: Payer: Self-pay

## 2020-07-20 NOTE — Telephone Encounter (Signed)
Called and lmomed the pt for overdue inr 

## 2020-07-21 LAB — POCT INR: INR: 2.5 (ref 2.0–3.0)

## 2020-07-22 ENCOUNTER — Ambulatory Visit (INDEPENDENT_AMBULATORY_CARE_PROVIDER_SITE_OTHER): Payer: BC Managed Care – PPO | Admitting: Cardiology

## 2020-07-22 DIAGNOSIS — Z954 Presence of other heart-valve replacement: Secondary | ICD-10-CM | POA: Diagnosis not present

## 2020-07-22 DIAGNOSIS — Z5181 Encounter for therapeutic drug level monitoring: Secondary | ICD-10-CM | POA: Diagnosis not present

## 2020-07-24 ENCOUNTER — Encounter (HOSPITAL_COMMUNITY): Payer: Self-pay | Admitting: Emergency Medicine

## 2020-07-24 ENCOUNTER — Other Ambulatory Visit: Payer: Self-pay

## 2020-07-24 ENCOUNTER — Emergency Department (HOSPITAL_COMMUNITY): Payer: BC Managed Care – PPO

## 2020-07-24 ENCOUNTER — Emergency Department (HOSPITAL_COMMUNITY)
Admission: EM | Admit: 2020-07-24 | Discharge: 2020-07-25 | Disposition: A | Discharge status: Home / Self Care | Payer: BC Managed Care – PPO | Attending: Emergency Medicine | Admitting: Emergency Medicine

## 2020-07-24 DIAGNOSIS — Z20822 Contact with and (suspected) exposure to covid-19: Secondary | ICD-10-CM | POA: Insufficient documentation

## 2020-07-24 DIAGNOSIS — I5032 Chronic diastolic (congestive) heart failure: Secondary | ICD-10-CM | POA: Insufficient documentation

## 2020-07-24 DIAGNOSIS — Z954 Presence of other heart-valve replacement: Secondary | ICD-10-CM | POA: Insufficient documentation

## 2020-07-24 DIAGNOSIS — M25552 Pain in left hip: Secondary | ICD-10-CM | POA: Insufficient documentation

## 2020-07-24 DIAGNOSIS — Z7901 Long term (current) use of anticoagulants: Secondary | ICD-10-CM | POA: Insufficient documentation

## 2020-07-24 DIAGNOSIS — I110 Hypertensive heart disease with heart failure: Secondary | ICD-10-CM | POA: Insufficient documentation

## 2020-07-24 DIAGNOSIS — Z7982 Long term (current) use of aspirin: Secondary | ICD-10-CM | POA: Insufficient documentation

## 2020-07-24 DIAGNOSIS — Z79899 Other long term (current) drug therapy: Secondary | ICD-10-CM | POA: Insufficient documentation

## 2020-07-24 DIAGNOSIS — Z96642 Presence of left artificial hip joint: Secondary | ICD-10-CM | POA: Insufficient documentation

## 2020-07-24 DIAGNOSIS — T8452XA Infection and inflammatory reaction due to internal left hip prosthesis, initial encounter: Secondary | ICD-10-CM

## 2020-07-24 DIAGNOSIS — M25559 Pain in unspecified hip: Secondary | ICD-10-CM

## 2020-07-24 LAB — URINALYSIS, ROUTINE W REFLEX MICROSCOPIC
Bilirubin Urine: NEGATIVE
Glucose, UA: NEGATIVE mg/dL
Hgb urine dipstick: NEGATIVE
Ketones, ur: NEGATIVE mg/dL
Leukocytes,Ua: NEGATIVE
Nitrite: NEGATIVE
Protein, ur: 30 mg/dL — AB
Specific Gravity, Urine: 1.027 (ref 1.005–1.030)
pH: 5 (ref 5.0–8.0)

## 2020-07-24 LAB — CBC WITH DIFFERENTIAL/PLATELET
Abs Immature Granulocytes: 0.01 10*3/uL (ref 0.00–0.07)
Basophils Absolute: 0.1 10*3/uL (ref 0.0–0.1)
Basophils Relative: 1 %
Eosinophils Absolute: 0.1 10*3/uL (ref 0.0–0.5)
Eosinophils Relative: 2 %
HCT: 34.4 % — ABNORMAL LOW (ref 39.0–52.0)
Hemoglobin: 10.2 g/dL — ABNORMAL LOW (ref 13.0–17.0)
Immature Granulocytes: 0 %
Lymphocytes Relative: 19 %
Lymphs Abs: 1.3 10*3/uL (ref 0.7–4.0)
MCH: 22.8 pg — ABNORMAL LOW (ref 26.0–34.0)
MCHC: 29.7 g/dL — ABNORMAL LOW (ref 30.0–36.0)
MCV: 77 fL — ABNORMAL LOW (ref 80.0–100.0)
Monocytes Absolute: 0.6 10*3/uL (ref 0.1–1.0)
Monocytes Relative: 9 %
Neutro Abs: 4.6 10*3/uL (ref 1.7–7.7)
Neutrophils Relative %: 69 %
Platelets: 259 10*3/uL (ref 150–400)
RBC: 4.47 MIL/uL (ref 4.22–5.81)
RDW: 16.9 % — ABNORMAL HIGH (ref 11.5–15.5)
WBC: 6.6 10*3/uL (ref 4.0–10.5)
nRBC: 0 % (ref 0.0–0.2)

## 2020-07-24 LAB — COMPREHENSIVE METABOLIC PANEL
ALT: 12 U/L (ref 0–44)
AST: 13 U/L — ABNORMAL LOW (ref 15–41)
Albumin: 3.5 g/dL (ref 3.5–5.0)
Alkaline Phosphatase: 84 U/L (ref 38–126)
Anion gap: 10 (ref 5–15)
BUN: 8 mg/dL (ref 6–20)
CO2: 26 mmol/L (ref 22–32)
Calcium: 8.7 mg/dL — ABNORMAL LOW (ref 8.9–10.3)
Chloride: 104 mmol/L (ref 98–111)
Creatinine, Ser: 0.96 mg/dL (ref 0.61–1.24)
GFR calc Af Amer: >60 mL/min (ref >60)
GFR calc non Af Amer: >60 mL/min (ref >60)
Glucose, Bld: 78 mg/dL (ref 70–99)
Potassium: 3.8 mmol/L (ref 3.5–5.1)
Sodium: 140 mmol/L (ref 135–145)
Total Bilirubin: 0.4 mg/dL (ref 0.3–1.2)
Total Protein: 7.1 g/dL (ref 6.5–8.1)

## 2020-07-24 LAB — C-REACTIVE PROTEIN: CRP: 10.6 mg/dL — ABNORMAL HIGH (ref <1.0)

## 2020-07-24 LAB — SEDIMENTATION RATE: Sed Rate: 66 mm/hr — ABNORMAL HIGH (ref 0–16)

## 2020-07-24 LAB — LACTIC ACID, PLASMA: Lactic Acid, Venous: 1.1 mmol/L (ref 0.5–1.9)

## 2020-07-24 MED ORDER — GADOBUTROL 1 MMOL/ML IV SOLN
9.0000 mL | Freq: Once | INTRAVENOUS | Status: AC | PRN
  Administered 2020-07-24: 9 mL via INTRAVENOUS

## 2020-07-24 MED ORDER — HYDROMORPHONE HCL 1 MG/ML IJ SOLN
1.0000 mg | Freq: Once | INTRAMUSCULAR | Status: AC
  Administered 2020-07-24: 1 mg via INTRAVENOUS
  Filled 2020-07-24: qty 1

## 2020-07-24 NOTE — ED Notes (Signed)
Patient transported to MRI 

## 2020-07-24 NOTE — ED Provider Notes (Signed)
  Assumed care at shift change.  See prior notes for full H&P.  Briefly 34 y.o. M here with left hip pain.  Hx of left hip replacement with prior incidence of septic joint requiring wash out.  Scheduled for MRI Thursday from his orthopedist, however increased pain and new fevers.  Labs grossly reassuring, inflammatory markers elevated.  Hip has been managed at [facility].  Plan:  MRI hip pending.  11:03 PM  Discussed with radiology, Dr. 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  Question particulate disease?  Requested x-ray for comparison and will re-discuss.  Per chart review, no recent procedures.  Surgical history as follows:  Initial L THA 09/10/19, revision 03/28/20 with abx spacer, I&D left hip 05/02/20.  Orthopedist is Dr. Melvyn Neth at [facility].  12:33 AM Discussed with on call orthopedics at [facility], Dr. Alleen Borne-- requested ED to ED transfer so they can assess on arrival.  Recommends to re-start whatever antibiotics they had last time as likely same organism.  Per chart review-- looks like last I&D culture grew out MSSA+.  Was on ancef per ID and transitioned to oral bactrim.  Patient has been accepted as ED to ED transfer at [facility], Dr. Pernell Dupre accepting.  Patient and wife made aware, they are comfortable with plan.     Garlon Hatchet, PA-C 07/25/20 [id number]    Gerhard Munch, MD 07/27/20 [phone]

## 2020-07-24 NOTE — ED Provider Notes (Signed)
[facility]-EMERGENCY DEPT Provider Note   CSN: [id number] Arrival date & time: 07/24/20  1547     History Chief Complaint  Patient presents with  . Fever  . Hip Pain    Darren Haynes is a 34 y.o. male.  HPI  34 year old male w/ a history of ANV, CHF, pulmonary hypertension, AVN of the left hip, septic left hip, rheumatic fever, mitral valve stenosis with mitral valve replacement presents to the ER with complaints of left hip pain x3 weeks.  Patient states that his left hip pain has been progressively getting worse, and he has recently started developing fevers as high as 101.  He states that he has a history of a septic joint in that hip which was replaced by Dr. Melvyn Neth with [facility].  He has been in contact with them and has a pending MRI with them on Thursday.  He presents to the ER stating that he cannot wait that long as his pain is excruciating.  Has been able to ambulate but with pain.  Denies any numbness or tingling in his extremity.  He is on Coumadin.  Ports pain is 10/10.  Per chart review patient states that he has a history of recurrent fevers due to his rheumatic fever.  Denies any abdominal pain, chest pain, cough, nausea, vomiting, dysuria, or any other infectious symptoms.    Past Medical History:  Diagnosis Date  . Arthritis   . Avascular necrosis (HCC)   . CHF (congestive heart failure) (HCC)   . Chronic back pain   . Depression   . Dyspnea   . GERD (gastroesophageal reflux disease)   . Heart murmur   . Mitral valve stenosis   . Panic attacks   . Pneumonia    hx 4 time  . Pulmonary hypertension (HCC)   . Rheumatic fever   . 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 Spermatocele 11/12/2019  . 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 Status post hip replacement, left 09/25/2019  . Avascular necrosis of bone of left hip (HCC) 09/10/2019  . Anxiety and depression 08/21/2019  . Chronic diastolic heart failure (HCC) 08/21/2019  . Chronic pain 08/21/2019  . Inflammatory arthritis 08/21/2019  . Rheumatic heart disease 08/21/2019  . Dry eye syndrome of both eyes 08/13/2019  . Monocular diplopia 08/13/2019  . Transient visual loss, bilateral 08/13/2019  . Heart rate fast 07/07/2019  . Dysfunction of both eustachian tubes 06/17/2019  . 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 PSVT (paroxysmal supraventricular tachycardia) (HCC) 03/12/2019  . Nonsustained ventricular tachycardia (HCC) 03/12/2019  . Bilateral lower extremity edema 03/12/2019  . Dyspnea on exertion 03/12/2019  . Tobacco use disorder 01/20/2019  . Mitral stenosis 12/09/2018  . Pulmonary hypertension (HCC) 11/21/2018  . Neck pain 07/08/2018  . Rib pain 06/05/2018  . Acute otitis externa of left ear 06/04/2016  . Acute suppurative otitis media of left ear with spontaneous rupture of tympanic membrane 06/04/2016  . Gastroesophageal reflux disease without esophagitis 06/15/2014  . Obesity (BMI 30-39.9) 06/15/2014  . OSA (obstructive sleep apnea) 01/07/2014    Past Surgical History:  Procedure Laterality Date  . BALLOON VALVULOPLASTY  12/24/2018   balloon mitral valvuloplasty at [facility]  . JOINT REPLACEMENT    .  MITRAL VALVE REPLACEMENT Right 05/14/2019   Procedure: MINIMALLY INVASIVE MITRAL VALVE (MV) REPLACEMENT USING CARBOMEDICS OPTIFORM Size 29mm;  Surgeon: Purcell Nails, MD;  Location: [facility] OR;  Service: Open Heart Surgery;  Laterality: Right;  . RIGHT/LEFT HEART CATH AND CORONARY ANGIOGRAPHY N/A 12/11/2018   Procedure: RIGHT/LEFT HEART CATH AND CORONARY ANGIOGRAPHY;  Surgeon: Runell Gess, MD;  Location: [facility] INVASIVE CV LAB;  Service: Cardiovascular;  Laterality: N/A;  . TEE WITHOUT CARDIOVERSION N/A  12/11/2018   Procedure: TRANSESOPHAGEAL ECHOCARDIOGRAM (TEE);  Surgeon: Thurmon Fair, MD;  Location: [facility] ENDOSCOPY;  Service: Cardiovascular;  Laterality: N/A;  . TEE WITHOUT CARDIOVERSION N/A 03/27/2019   Procedure: TRANSESOPHAGEAL ECHOCARDIOGRAM (TEE);  Surgeon: Elease Hashimoto Deloris Ping, MD;  Location: [facility] ENDOSCOPY;  Service: Cardiovascular;  Laterality: N/A;  . TEE WITHOUT CARDIOVERSION N/A 05/14/2019   Procedure: TRANSESOPHAGEAL ECHOCARDIOGRAM (TEE);  Surgeon: Purcell Nails, MD;  Location: [facility] OR;  Service: Open Heart Surgery;  Laterality: N/A;  . TYMPANOSTOMY TUBE PLACEMENT         Family History  Problem Relation Age of Onset  . Heart disease Mother   . Depression Mother   . Hypertension Father   . Hyperlipidemia Father     Social History   Tobacco Use  . Smoking status: Never Smoker  . Smokeless tobacco: Current User    Types: Snuff  Vaping Use  . Vaping Use: Never used  Substance Use Topics  . Alcohol use: No  . Drug use: No    Home Medications Prior to Admission medications   Medication Sig Start Date End Date Taking? Authorizing Provider  ADVAIR HFA 115-21 MCG/ACT inhaler Inhale 1 puff into the lungs 2 (two) times daily. 08/07/19   [provider]  albuterol (VENTOLIN HFA) 108 (90 Base) MCG/ACT inhaler Inhale 1-2 puffs into the lungs 4 (four) times daily as needed for wheezing or shortness of breath.  07/08/19   [provider]  Apremilast (OTEZLA) 30 MG TABS Take 30 mg by mouth in the morning and at bedtime. 12/07/19   [provider]  aspirin EC 81 MG tablet Take 81 mg by mouth daily.    [provider]  Buprenorphine HCl (BELBUCA) 750 MCG FILM Place inside cheek.    [provider]  busPIRone (BUSPAR) 15 MG tablet Take 15 mg by mouth 3 (three) times daily. 07/15/19   [provider]  celecoxib (CELEBREX) 200 MG capsule Take 200 mg by mouth daily.  06/30/19   [provider]  Cholecalciferol (VITAMIN D-1000 MAX ST)  25 MCG (1000 UT) tablet Take 1,000 Units by mouth once a week.     [provider]  clonazePAM (KLONOPIN) 0.5 MG tablet Take 0.5 mg by mouth at bedtime.    [provider]  Desvenlafaxine Succinate ER 25 MG TB24 Take 25 mg by mouth 2 (two) times daily.    [provider]  diclofenac Sodium (VOLTAREN) 1 % GEL SMARTSIG:2 Gram(s) Topical 3 Times Daily PRN 02/10/20   [provider]  folic acid (FOLVITE) 1 MG tablet Take by mouth. 02/02/19   [provider]  furosemide (LASIX) 40 MG tablet Take 1 tablet (40 mg total) by mouth daily. You may take an extra  as needed for volume overload. 07/23/19   Azalee Course, PA  gabapentin (NEURONTIN) 800 MG tablet Take 800 mg by mouth 3 (three) times daily. 02/09/20   [provider]  hydroxychloroquine (PLAQUENIL) 200 MG tablet Take by mouth daily.    [provider]  leflunomide (ARAVA) 20 MG tablet Take 20 mg by mouth daily.    [provider]  metoprolol succinate (TOPROL-XL) 50 MG 24 hr tablet Take 50 mg by mouth daily. 01/30/20   [provider]  nortriptyline (PAMELOR) 25 MG capsule Take 25 mg by mouth at bedtime.  10/24/18   [provider]  omeprazole (PRILOSEC) 40 MG capsule Take 40 mg by mouth daily. 03/18/20   [provider]  ondansetron (ZOFRAN) 4 MG tablet TAKE 1 TABLET BY MOUTH THREE TIMES DAILY 12/15/19   [provider]  Oxycodone HCl 10 MG TABS Take 10 mg by mouth every 4 (four) hours as needed (pain).  07/08/19   [provider]  penicillin v potassium (VEETID) 250 MG tablet Take 250 mg by mouth 2 (two) times a day.    [provider]  potassium chloride (KLOR-CON) 10 MEQ tablet Take 10 mEq by mouth daily. 02/04/20   [provider]  pravastatin (PRAVACHOL) 20 MG tablet Take 20 mg by mouth daily.    [provider]  risperiDONE (RISPERDAL) 0.25 MG tablet Take 0.25 mg by mouth at bedtime. 01/15/20   [provider]  tiZANidine (ZANAFLEX) 4 MG tablet Take 4 mg by mouth 3 (three) times daily. 04/22/19   [provider]  warfarin (COUMADIN) 5 MG tablet TAKE 1 TO 1.5 TABLETS BY MOUTH DAILY AS DIRECTED BY COUMADIN CLINIC 06/15/20   Runell Gess, MD  XTAMPZA ER 36 MG C12A Take 1 capsule by mouth 2 (two) times daily. 02/11/20   [provider]    Allergies    Patient has no known allergies.  Review of Systems   Review of Systems  Constitutional: Positive for chills and fever.  HENT: Negative for ear pain and sore throat.   Eyes: Negative for pain and visual disturbance.  Respiratory: Negative for cough and shortness of breath.   Cardiovascular: Negative for chest pain and palpitations.  Gastrointestinal: Negative for abdominal pain and vomiting.  Genitourinary: Negative for dysuria and hematuria.  Musculoskeletal: Positive for arthralgias. Negative for back pain.  Skin: Negative for color change and rash.  Neurological: Negative for seizures and syncope.  All other systems reviewed and are negative.   Physical Exam Updated Vital Signs BP (!) 111/47   Pulse 83   Temp 98.4 F (36.9 C) (Oral)   Resp 18   Ht 6\' 2"  (1.88 m)   Wt 92.1 kg   SpO2 98%   BMI 26.06 kg/m   Physical Exam Vitals and nursing note reviewed.  Constitutional:      General: He is not in acute distress.    Appearance: He is well-developed. He is not ill-appearing, toxic-appearing or diaphoretic.  HENT:     Head: Normocephalic and atraumatic.  Eyes:     Conjunctiva/sclera: Conjunctivae normal.  Cardiovascular:     Rate and Rhythm: Normal rate and regular rhythm.     Heart sounds: No murmur heard.   Pulmonary:     Effort: Pulmonary effort is normal. No respiratory distress.     Breath sounds: Normal breath sounds.  Abdominal:     Palpations: Abdomen is soft.     Tenderness: There is no abdominal tenderness.  Musculoskeletal:        General: No swelling or tenderness.     Cervical  back: Neck supple.     Comments: Left hip incisions without evidence of dehiscence, surrounding erythema, drainage, warmth.  Patient has full passive range of motion of hip,  however straight leg test is very limited secondary to pain.  He does have some warmth to the left hip joint.  Skin:    General: Skin is warm and dry.     Findings: No erythema or rash.  Neurological:     General: No focal deficit present.     Mental Status: He is alert and oriented to person, place, and time.     Sensory: No sensory deficit.     Motor: No weakness.  Psychiatric:        Mood and Affect: Mood normal.        Behavior: Behavior normal.     ED Results / Procedures / Treatments   Labs (all labs ordered are listed, but only abnormal results are displayed) Labs Reviewed  CBC WITH DIFFERENTIAL/PLATELET - Abnormal; Notable for the following components:      Result Value   Hemoglobin 10.2 (*)    HCT 34.4 (*)    MCV 77.0 (*)    MCH 22.8 (*)    MCHC 29.7 (*)    RDW 16.9 (*)    All other components within normal limits  COMPREHENSIVE METABOLIC PANEL - Abnormal; Notable for the following components:   Calcium 8.7 (*)    AST 13 (*)    All other components within normal limits  SEDIMENTATION RATE - Abnormal; Notable for the following components:   Sed Rate 66 (*)    All other components within normal limits  C-REACTIVE PROTEIN - Abnormal; Notable for the following components:   CRP 10.6 (*)    All other components within normal limits  URINALYSIS, ROUTINE W REFLEX MICROSCOPIC - Abnormal; Notable for the following components:   APPearance HAZY (*)    Protein, ur 30 (*)    Bacteria, UA RARE (*)    All other components within normal limits  CULTURE, BLOOD (ROUTINE X 2)  CULTURE, BLOOD (ROUTINE X 2)  SARS CORONAVIRUS 2 BY RT PCR (HOSPITAL ORDER, PERFORMED IN [facility])  LACTIC ACID, PLASMA  LACTIC ACID, PLASMA    EKG None  Radiology No results found.  Procedures Procedures  (including critical care time)  Medications Ordered in ED Medications  HYDROmorphone (DILAUDID) injection 1 mg (1 mg Intravenous Given 07/24/20 2157)    ED Course  I have reviewed the triage vital signs and the nursing notes.  Pertinent labs & imaging results that were available during my care of the patient were reviewed by me and considered in my medical decision making (see chart for details).    MDM Rules/Calculators/A&P                         34 year old male with left hip pain, fevers, history of septic left joint requiring washout On presentation, he is alert, oriented, nontoxic-appearing, in no acute distress.  Vitals reassuring, no evidence of sepsis, he is afebrile.  Physical exam with limited straight leg test secondary to pain, intact passive range of motion.  No evidence of infection to his well-healed incisions.  Ddx includes septic joint, COVID, viral URI, UTI  Plan for basic labs, UA, lactic, sed rate, CRP, blood cultures, discussed the case with Dr. Jeraldine Loots, will order an MRI of the left hip.  Personally reviewed and interpreted his labs  CBC without leukocytosis, hemoglobin of 10.2, appears more or less stable.  CMP without any electrolyte abnormalities, normal renal and liver function tests.  CRP of 10.6.  UA without evidence of UTI.  Blood cultures pending.  Initial lactic of 1.1.  Sed rate of 66.  Pending MRI imaging  Signed out care to [employer].  She will oversee the patient's MRI results and dispo accordingly.  If his MRI is normal, suspect that he will be stable to go home and follow-up with Dr. Melvyn Neth with [facility].  However, the joint is infected again, likely will need transfer to [facility] for surgery.  Final Clinical Impression(s) / ED Diagnoses Final diagnoses:  None    Rx / DC Orders ED Discharge Orders    None       Leone Brand 07/24/20 2205    Gerhard Munch, MD 07/27/20 2258

## 2020-07-24 NOTE — ED Triage Notes (Signed)
Patient here from home reporting left hip pain. Reports that he has a hx of septic hip and is followed by [facility]. Multiple surgeries. Fever. On coumadin. Hydrocodone 2 hours ago.

## 2020-07-25 LAB — SARS CORONAVIRUS 2 BY RT PCR (HOSPITAL ORDER, PERFORMED IN ~~LOC~~ HOSPITAL LAB): SARS Coronavirus 2: NEGATIVE

## 2020-07-25 MED ORDER — CEFAZOLIN SODIUM-DEXTROSE 2-4 GM/100ML-% IV SOLN
2.0000 g | Freq: Once | INTRAVENOUS | Status: AC
  Administered 2020-07-25: 2 g via INTRAVENOUS
  Filled 2020-07-25: qty 100

## 2020-07-25 MED ORDER — HYDROMORPHONE HCL 1 MG/ML IJ SOLN
1.0000 mg | Freq: Once | INTRAMUSCULAR | Status: AC
  Administered 2020-07-25: 1 mg via INTRAVENOUS
  Filled 2020-07-25: qty 1

## 2020-07-25 NOTE — ED Notes (Signed)
Per [facility] charge nurse Aggie Cosier, patient must have COVID result back before transport. [facility] updated about change.

## 2020-07-25 NOTE — ED Notes (Signed)
[facility] contacted for patient transport.

## 2020-07-28 DIAGNOSIS — D509 Iron deficiency anemia, unspecified: Secondary | ICD-10-CM | POA: Insufficient documentation

## 2020-07-30 LAB — CULTURE, BLOOD (ROUTINE X 2)
Culture: NO GROWTH
Special Requests: ADEQUATE

## 2020-08-02 ENCOUNTER — Ambulatory Visit (HOSPITAL_COMMUNITY): Payer: BC Managed Care – PPO

## 2020-08-07 LAB — POCT INR: INR: 2.4 (ref 2.0–3.0)

## 2020-08-08 ENCOUNTER — Ambulatory Visit (INDEPENDENT_AMBULATORY_CARE_PROVIDER_SITE_OTHER): Payer: BC Managed Care – PPO | Admitting: Internal Medicine

## 2020-08-08 DIAGNOSIS — Z954 Presence of other heart-valve replacement: Secondary | ICD-10-CM

## 2020-08-17 ENCOUNTER — Ambulatory Visit (INDEPENDENT_AMBULATORY_CARE_PROVIDER_SITE_OTHER): Payer: BC Managed Care – PPO | Admitting: Cardiology

## 2020-08-17 DIAGNOSIS — Z954 Presence of other heart-valve replacement: Secondary | ICD-10-CM

## 2020-08-17 LAB — POCT INR: INR: 2.1 (ref 2.0–3.0)

## 2020-08-23 LAB — POCT INR: INR: 4 — AB (ref 2.0–3.0)

## 2020-08-24 ENCOUNTER — Ambulatory Visit (INDEPENDENT_AMBULATORY_CARE_PROVIDER_SITE_OTHER): Payer: BC Managed Care – PPO | Admitting: Cardiology

## 2020-08-24 DIAGNOSIS — Z954 Presence of other heart-valve replacement: Secondary | ICD-10-CM

## 2020-08-31 ENCOUNTER — Ambulatory Visit (INDEPENDENT_AMBULATORY_CARE_PROVIDER_SITE_OTHER): Payer: BC Managed Care – PPO | Admitting: Cardiovascular Disease

## 2020-08-31 DIAGNOSIS — Z954 Presence of other heart-valve replacement: Secondary | ICD-10-CM

## 2020-08-31 LAB — POCT INR: INR: 2.7 (ref 2.0–3.0)

## 2020-09-02 ENCOUNTER — Encounter (HOSPITAL_COMMUNITY): Payer: Self-pay | Admitting: Emergency Medicine

## 2020-09-02 ENCOUNTER — Emergency Department (HOSPITAL_COMMUNITY)
Admission: EM | Admit: 2020-09-02 | Discharge: 2020-09-02 | Disposition: A | Discharge status: Home / Self Care | Payer: BC Managed Care – PPO | Attending: Emergency Medicine | Admitting: Emergency Medicine

## 2020-09-02 ENCOUNTER — Emergency Department (HOSPITAL_COMMUNITY): Payer: BC Managed Care – PPO

## 2020-09-02 DIAGNOSIS — G894 Chronic pain syndrome: Secondary | ICD-10-CM | POA: Diagnosis not present

## 2020-09-02 DIAGNOSIS — S3992XA Unspecified injury of lower back, initial encounter: Secondary | ICD-10-CM | POA: Diagnosis not present

## 2020-09-02 DIAGNOSIS — Z79899 Other long term (current) drug therapy: Secondary | ICD-10-CM | POA: Diagnosis not present

## 2020-09-02 DIAGNOSIS — S301XXA Contusion of abdominal wall, initial encounter: Secondary | ICD-10-CM

## 2020-09-02 DIAGNOSIS — Z96642 Presence of left artificial hip joint: Secondary | ICD-10-CM | POA: Diagnosis not present

## 2020-09-02 DIAGNOSIS — G8911 Acute pain due to trauma: Secondary | ICD-10-CM | POA: Diagnosis not present

## 2020-09-02 DIAGNOSIS — S2090XA Unspecified superficial injury of unspecified parts of thorax, initial encounter: Secondary | ICD-10-CM | POA: Insufficient documentation

## 2020-09-02 DIAGNOSIS — Z952 Presence of prosthetic heart valve: Secondary | ICD-10-CM | POA: Diagnosis not present

## 2020-09-02 DIAGNOSIS — W2212XA Striking against or struck by front passenger side automobile airbag, initial encounter: Secondary | ICD-10-CM | POA: Insufficient documentation

## 2020-09-02 DIAGNOSIS — T07XXXA Unspecified multiple injuries, initial encounter: Secondary | ICD-10-CM | POA: Diagnosis not present

## 2020-09-02 DIAGNOSIS — Z951 Presence of aortocoronary bypass graft: Secondary | ICD-10-CM | POA: Insufficient documentation

## 2020-09-02 DIAGNOSIS — I509 Heart failure, unspecified: Secondary | ICD-10-CM | POA: Diagnosis not present

## 2020-09-02 DIAGNOSIS — G4733 Obstructive sleep apnea (adult) (pediatric): Secondary | ICD-10-CM | POA: Insufficient documentation

## 2020-09-02 DIAGNOSIS — Z7901 Long term (current) use of anticoagulants: Secondary | ICD-10-CM | POA: Insufficient documentation

## 2020-09-02 DIAGNOSIS — F17220 Nicotine dependence, chewing tobacco, uncomplicated: Secondary | ICD-10-CM | POA: Diagnosis not present

## 2020-09-02 DIAGNOSIS — Z7982 Long term (current) use of aspirin: Secondary | ICD-10-CM | POA: Diagnosis not present

## 2020-09-02 DIAGNOSIS — S20309A Unspecified superficial injuries of unspecified front wall of thorax, initial encounter: Secondary | ICD-10-CM | POA: Diagnosis not present

## 2020-09-02 DIAGNOSIS — T1490XA Injury, unspecified, initial encounter: Secondary | ICD-10-CM

## 2020-09-02 DIAGNOSIS — Z96649 Presence of unspecified artificial hip joint: Secondary | ICD-10-CM

## 2020-09-02 DIAGNOSIS — S01112A Laceration without foreign body of left eyelid and periocular area, initial encounter: Secondary | ICD-10-CM | POA: Diagnosis not present

## 2020-09-02 DIAGNOSIS — I272 Pulmonary hypertension, unspecified: Secondary | ICD-10-CM | POA: Insufficient documentation

## 2020-09-02 DIAGNOSIS — Z95828 Presence of other vascular implants and grafts: Secondary | ICD-10-CM | POA: Insufficient documentation

## 2020-09-02 DIAGNOSIS — S00212A Abrasion of left eyelid and periocular area, initial encounter: Secondary | ICD-10-CM

## 2020-09-02 DIAGNOSIS — T8459XA Infection and inflammatory reaction due to other internal joint prosthesis, initial encounter: Secondary | ICD-10-CM

## 2020-09-02 DIAGNOSIS — S0990XA Unspecified injury of head, initial encounter: Secondary | ICD-10-CM | POA: Diagnosis present

## 2020-09-02 LAB — COMPREHENSIVE METABOLIC PANEL WITH GFR
ALT: 7 U/L (ref 0–44)
AST: 17 U/L (ref 15–41)
Albumin: 3.7 g/dL (ref 3.5–5.0)
Alkaline Phosphatase: 87 U/L (ref 38–126)
Anion gap: 9 (ref 5–15)
BUN: 6 mg/dL (ref 6–20)
CO2: 25 mmol/L (ref 22–32)
Calcium: 8.6 mg/dL — ABNORMAL LOW (ref 8.9–10.3)
Chloride: 105 mmol/L (ref 98–111)
Creatinine, Ser: 0.91 mg/dL (ref 0.61–1.24)
GFR, Estimated: >60 mL/min
Glucose, Bld: 108 mg/dL — ABNORMAL HIGH (ref 70–99)
Potassium: 3.7 mmol/L (ref 3.5–5.1)
Sodium: 139 mmol/L (ref 135–145)
Total Bilirubin: 0.2 mg/dL — ABNORMAL LOW (ref 0.3–1.2)
Total Protein: 6.5 g/dL (ref 6.5–8.1)

## 2020-09-02 LAB — CBC WITH DIFFERENTIAL/PLATELET
Abs Immature Granulocytes: 0.03 10*3/uL (ref 0.00–0.07)
Basophils Absolute: 0.1 10*3/uL (ref 0.0–0.1)
Basophils Relative: 1 %
Eosinophils Absolute: 0.2 10*3/uL (ref 0.0–0.5)
Eosinophils Relative: 2 %
HCT: 37.8 % — ABNORMAL LOW (ref 39.0–52.0)
Hemoglobin: 10.8 g/dL — ABNORMAL LOW (ref 13.0–17.0)
Immature Granulocytes: 0 %
Lymphocytes Relative: 13 %
Lymphs Abs: 1.1 10*3/uL (ref 0.7–4.0)
MCH: 24.5 pg — ABNORMAL LOW (ref 26.0–34.0)
MCHC: 28.6 g/dL — ABNORMAL LOW (ref 30.0–36.0)
MCV: 85.7 fL (ref 80.0–100.0)
Monocytes Absolute: 0.6 10*3/uL (ref 0.1–1.0)
Monocytes Relative: 7 %
Neutro Abs: 6.6 10*3/uL (ref 1.7–7.7)
Neutrophils Relative %: 77 %
Platelets: 227 10*3/uL (ref 150–400)
RBC: 4.41 MIL/uL (ref 4.22–5.81)
RDW: 16.8 % — ABNORMAL HIGH (ref 11.5–15.5)
WBC: 8.6 10*3/uL (ref 4.0–10.5)
nRBC: 0 % (ref 0.0–0.2)

## 2020-09-02 LAB — PROTIME-INR
INR: 2.4 — ABNORMAL HIGH (ref 0.8–1.2)
Prothrombin Time: 25.1 seconds — ABNORMAL HIGH (ref 11.4–15.2)

## 2020-09-02 MED ORDER — IOHEXOL 300 MG/ML  SOLN
100.0000 mL | Freq: Once | INTRAMUSCULAR | Status: AC | PRN
  Administered 2020-09-02: 100 mL via INTRAVENOUS

## 2020-09-02 MED ORDER — OXYCODONE HCL 5 MG PO TABS
10.0000 mg | ORAL_TABLET | Freq: Once | ORAL | Status: AC
  Administered 2020-09-02: 10 mg via ORAL
  Filled 2020-09-02: qty 2

## 2020-09-02 MED ORDER — ACETAMINOPHEN 500 MG PO TABS
1000.0000 mg | ORAL_TABLET | Freq: Once | ORAL | Status: DC
  Filled 2020-09-02: qty 2

## 2020-09-02 MED ORDER — TETANUS-DIPHTH-ACELL PERTUSSIS 5-2.5-18.5 LF-MCG/0.5 IM SUSP
0.5000 mL | Freq: Once | INTRAMUSCULAR | Status: DC
  Filled 2020-09-02: qty 0.5

## 2020-09-02 NOTE — ED Provider Notes (Addendum)
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 No chest wall mass or suspicious bone lesions identified. CT ABDOMEN PELVIS FINDINGS Hepatobiliary: Gallbladder is decompressed. Liver appears within normal limits. Pancreas: Unremarkable. No pancreatic ductal dilatation or surrounding inflammatory changes. Spleen: Normal in size without focal abnormality. Adrenals/Urinary Tract: Adrenal glands are within normal limits. Kidneys demonstrate a normal enhancement pattern bilaterally. Bladder is decompressed Stomach/Bowel: Mild diverticular change of the colon is noted without evidence  of diverticulitis. No obstructive or inflammatory changes are seen. The appendix is within normal limits. Small bowel and stomach appear within normal limits.  Vascular/Lymphatic: No significant vascular findings are present. No enlarged abdominal or pelvic lymph nodes. Reproductive: Prostate is unremarkable. Other: Minimal free fluid is noted within the pelvis. This is of uncertain significance. Musculoskeletal: Left hip prosthesis is noted. There is inflammatory change surrounding the left hip prosthesis similar to that seen on prior MRI examination. There is fluid within the hip joint as well as a fluid collection laterally which extends towards the skin. These changes have improved slightly in the interval from the prior exam but again are highly suspicious for osteomyelitis. Undisplaced periprosthetic fracture is seen which appears chronic in nature the remainder of the bony structures are stable. IMPRESSION: No acute visceral injury is noted to correspond with the patient's given clinical history. Diverticulosis without diverticulitis. Left hip prosthesis within undisplaced chronic periprosthetic fracture along the proximal femoral shaft. 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 Subcutaneous abscess is noted as well. Electronically Signed   By: Alcide Clever M.D.   On: 09/02/2020 21:08    Procedures Procedures (including critical care time)  Medications Ordered in ED Medications  Tdap (BOOSTRIX) injection 0.5 mL (has no administration in time range)  iohexol (OMNIPAQUE) 300 MG/ML solution 100 mL (100 mLs Intravenous Contrast Given 09/02/20 2036)    ED Course  I have reviewed the triage vital signs and the nursing notes.  Pertinent labs & imaging results that were available during my care of the patient were reviewed by me and considered in my medical decision making (see chart for details).    MDM Rules/Calculators/A&P                         Labs and imaging ordered from triage.   Reviewed nursing notes and prior charts for additional history.   Pt indicates hx chronic  hip infection, picc and abx therapy for same - denies any acute or abrupt worsening. No fever or chills. No increased pain, swelling, or drainage. Has f/u arranged.   Labs reviewed/interpreted by me - wbc normal. Chem normal.  CT reviewed/interpreted by me - no acute fx. Chronic changes hip related to chr infection.   Acetaminophen po ordered - pt states cant take acetaminophen and that he takes oxycodone for chronic pain, and indicates hasnt had any pain meds, requests dose. Oxycodone in ED.   Wound cleaned. Tetanus is up to date.  Pt currently appears stable for d/c.   Return precautions provided.    Final Clinical Impression(s) / ED Diagnoses Final diagnoses:  Trauma    Rx / DC Orders ED Discharge Orders    None           Cathren Laine, MD 09/02/20 2158
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
## 2020-09-02 NOTE — Discharge Instructions (Signed)
It was our pleasure to provide your ER care today - we hope that you feel better.  Take your pain medication as need.   Keep abrasion very clean.   For your chronic hip infection - continued evidence of infection/abscess is noted on CT imaging - continue antibiotic therapy, and follow up with your doctor in the coming week.  Return to ER if worse, new symptoms, new or severe pain, severe headache, persistent vomiting, severe abdominal pain, fevers, increased swelling/spreading redness, or other concern.   You were given pain medication in the ER - no driving tonight.
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
## 2020-09-07 ENCOUNTER — Telehealth: Payer: Self-pay

## 2020-09-07 ENCOUNTER — Ambulatory Visit (INDEPENDENT_AMBULATORY_CARE_PROVIDER_SITE_OTHER): Payer: BC Managed Care – PPO | Admitting: Cardiovascular Disease

## 2020-09-07 DIAGNOSIS — I050 Rheumatic mitral stenosis: Secondary | ICD-10-CM

## 2020-09-07 DIAGNOSIS — Z954 Presence of other heart-valve replacement: Secondary | ICD-10-CM

## 2020-09-07 LAB — POCT INR: INR: 3.2 — AB (ref 2.0–3.0)

## 2020-09-07 NOTE — Telephone Encounter (Signed)
Sulfasalazine and warfarin may result in an increased risk of bleeding however we will be monitoring his INR so it should not be too much of a concern

## 2020-09-07 NOTE — Telephone Encounter (Signed)
Called and spoke w/pt and stated Sulfasalazine and warfarin may result in an increased risk of bleeding however we will be monitoring his INR so it should not be too much of a concern but its highly imperative that its checked on time w/a med that can cause inr to increase. Pt voiced understanding

## 2020-09-07 NOTE — Telephone Encounter (Signed)
Pt called and lmom [place] stated rheumatologist wants to start a new medication and would like to know if he is able to take it while on warfarin.... SULFATHALAZINE is what I think he said... will route to pharmd pool for further review

## 2020-09-14 ENCOUNTER — Telehealth (HOSPITAL_COMMUNITY): Payer: Self-pay | Admitting: *Deleted

## 2020-09-14 NOTE — Telephone Encounter (Signed)
Pt called to re schedule his appointments for Pulmonary Rehab.  Tramel was initially scheduled for 9/21.  Unfortunately he was admitted to the hospital and had on 9/20 Revision of infected hip arthoplasty. Pt discharged on 9/24 and completed 6 weeks of IV antibiotics. Reviewed medical records including follow up with ortho, Infectious disease, pulmonary and cardiology.  Noted in his pulmonary follow up  appt that his hip is stable and he may proceed with pulmonary rehab.  Ortho notes also indicated the recovery is going well and he has no restrictions.  Will have support staff update his benefit information and contact for scheduling.  Pt verbalized understanding and is eager to begin. Alanson Aly, BSN Cardiac and Emergency planning/management officer

## 2020-09-15 ENCOUNTER — Telehealth: Payer: Self-pay

## 2020-09-15 NOTE — Telephone Encounter (Signed)
Called and spoke w/pt regarding overdue inr. Pt stated that they would do it later today

## 2020-09-19 ENCOUNTER — Encounter (HOSPITAL_COMMUNITY): Payer: Self-pay

## 2020-09-19 ENCOUNTER — Other Ambulatory Visit: Payer: Self-pay

## 2020-09-19 ENCOUNTER — Encounter (HOSPITAL_COMMUNITY)
Admission: RE | Admit: 2020-09-19 | Discharge: 2020-09-19 | Disposition: A | Discharge status: Home / Self Care | Payer: BC Managed Care – PPO | Source: Ambulatory Visit | Attending: Cardiology | Admitting: Cardiology

## 2020-09-19 VITALS — BP 112/70 | HR 89 | Ht 74.0 in | Wt 213.4 lb

## 2020-09-19 DIAGNOSIS — J984 Other disorders of lung: Secondary | ICD-10-CM | POA: Diagnosis not present

## 2020-09-19 NOTE — Progress Notes (Signed)
Darren Haynes 34 y.o. male Pulmonary Rehab Orientation Note This patient who was referred to Pulmonary rehab by Dr. Skeet Latch from [facility] with the diagnosis of restrictive lung disease arrived today in Cardiac and Pulmonary Rehab. He arrived ambulatory with a slow but stable gait. He has had 6 left hip surgeries with the last being 08/2020 due to an infection and is on oral antibiotics.  He does not carry portable oxygen.  Per pt, he uses oxygen never. Color good, skin warm and dry. Patient is oriented to time and place. Patient's medical history, psychosocial health, and medications reviewed. Psychosocial assessment reveals pt lives with their spouse. Pt is currently disabled due to multiple health issues with his heart, lungs, and back issues.  Pt hobbies include hunting. Pt reports his stress level is moderate. Areas of stress/anxiety include Health and inability to perform activities he use to enjoy.  Pt does not exhibit signs of depression even though he has a history of depression. PHQ2/9 score 0/0. Pt shows fair  coping skills with positive outlook . Patient uses smokeless tobacco all day while awake.  He did quit once, but stress made him return to tobacco use, at this point he is not ready to quit.  He has been contacted from [facility] tobacco cessation program, but has not started it as of yet. He uses tobacco to handle stress.  Will continue to monitor and evaluate progress toward psychosocial goal(s) of handling stress in healthy manners and assist patient in selecting a quit date to quit using tobacco products . Physical assessment reveals heart rate is normal, breath sounds clear to auscultation, no wheezes, rales, or rhonchi. Grip strength equal, strong. Distal pulses 3+ bilateral posterior tibial pulses present without peripheral edema. Patient reports he does take medications as prescribed. Patient states he follows a Low Sodium diet. The patient reports no specific efforts to  gain or lose weight..He reports he has gained 12 pounds over the last few weeks and feels it is "fluid".  He noticed his hunting pants were too small yesterday and that he has not been over eating.  He was taken off a diuretic by his cardiologist a few months ago and will discuss going back on it with them.   Patient's weight will be monitored closely. Demonstration and practice of PLB using pulse oximeter. Patient able to return demonstration satisfactorily. Safety and hand hygiene in the exercise area reviewed with patient. Patient voices understanding of the information reviewed. Department expectations discussed with patient and achievable goals were set. The patient shows enthusiasm about attending the program and we look forward to working with this nice gentleman. The patient completed a 6 min walk test on today, 09/19/2020 and to begin exercise on Tuesday, 09/27/2020 in the 10:15 am exercise slot.  [id number]

## 2020-09-19 NOTE — Progress Notes (Signed)
Pulmonary Individual Treatment Plan  Patient Details  Name: Darren Haynes MRN: [id number] Date of Birth: Aug 31, 1986 Referring Provider:     Pulmonary Rehab Walk Test from 09/19/2020 in [facility]  Referring Provider Dr. Mayford Knife      Initial Encounter Date:    Pulmonary Rehab Walk Test from 09/19/2020 in [facility] CARDI[facility] REHAB  Date 09/19/20      Visit Diagnosis: Restrictive lung disease  Patient's Home Medications on Admission:   Current Outpatient Medications:  .  Apremilast (OTEZLA) 30 MG TABS, Take 30 mg by mouth in the morning and at bedtime., Disp: , Rfl:  .  aspirin EC 81 MG tablet, Take 81 mg by mouth daily., Disp: , Rfl:  .  Buprenorphine HCl (BELBUCA) 900 MCG FILM, Place 2 Film inside cheek daily. , Disp: , Rfl:  .  busPIRone (BUSPAR) 15 MG tablet, Take 15 mg by mouth 3 (three) times daily., Disp: , Rfl:  .  celecoxib (CELEBREX) 200 MG capsule, Take 200 mg by mouth daily. , Disp: , Rfl:  .  Cholecalciferol (VITAMIN D-1000 MAX ST) 25 MCG (1000 UT) tablet, Take 1,000 Units by mouth once a week. , Disp: , Rfl:  .  clonazePAM (KLONOPIN) 0.5 MG tablet, Take 0.5 mg by mouth at bedtime., Disp: , Rfl:  .  Desvenlafaxine Succinate ER 25 MG TB24, Take 25 mg by mouth 2 (two) times daily., Disp: , Rfl:  .  diclofenac Sodium (VOLTAREN) 1 % GEL, SMARTSIG:2 Gram(s) Topical 3 Times Daily PRN, Disp: , Rfl:  .  folic acid (FOLVITE) 1 MG tablet, Take by mouth. , Disp: , Rfl:  .  furosemide (LASIX) 40 MG tablet, Take 1 tablet (40 mg total) by mouth daily. You may take an extra 20mg  as needed for volume overload., Disp: 45 tablet, Rfl: 3 .  gabapentin (NEURONTIN) 800 MG tablet, Take 800 mg by mouth 3 (three) times daily., Disp: , Rfl:  .  hydroxychloroquine (PLAQUENIL) 200 MG tablet, Take by mouth daily. , Disp: , Rfl:  .  leflunomide (ARAVA) 20 MG tablet, Take 20 mg by mouth daily., Disp: , Rfl:  .  metoprolol succinate (TOPROL-XL) 50 MG 24  hr tablet, Take 50 mg by mouth daily., Disp: , Rfl:  .  metoprolol tartrate (LOPRESSOR) 25 MG tablet, Take 25 mg by mouth 2 (two) times daily., Disp: , Rfl:  .  nortriptyline (PAMELOR) 25 MG capsule, Take 25 mg by mouth at bedtime. , Disp: , Rfl:  .  omeprazole (PRILOSEC) 40 MG capsule, Take 40 mg by mouth daily., Disp: , Rfl:  .  ondansetron (ZOFRAN) 4 MG tablet, TAKE 1 TABLET BY MOUTH THREE TIMES DAILY, Disp: , Rfl:  .  Oxycodone HCl 10 MG TABS, Take 15 mg by mouth every 4 (four) hours as needed (pain). , Disp: , Rfl:  .  potassium chloride (KLOR-CON) 10 MEQ tablet, Take 10 mEq by mouth daily., Disp: , Rfl:  .  pravastatin (PRAVACHOL) 20 MG tablet, Take 20 mg by mouth daily., Disp: , Rfl:  .  risperiDONE (RISPERDAL) 0.25 MG tablet, Take 0.25 mg by mouth at bedtime., Disp: , Rfl:  .  tiZANidine (ZANAFLEX) 4 MG tablet, Take 4 mg by mouth 3 (three) times daily., Disp: , Rfl:  .  warfarin (COUMADIN) 5 MG tablet, TAKE 1 TO 1.5 TABLETS BY MOUTH DAILY AS DIRECTED BY COUMADIN CLINIC, Disp: 120 tablet, Rfl: 1 .  XTAMPZA ER 36 MG C12A, Take 1 capsule  by mouth 2 (two) times daily., Disp: , Rfl:  .  ADVAIR HFA 115-21 MCG/ACT inhaler, Inhale 1 puff into the lungs 2 (two) times daily. (Patient not taking: Reported on 09/19/2020), Disp: , Rfl:  .  albuterol (VENTOLIN HFA) 108 (90 Base) MCG/ACT inhaler, Inhale 1-2 puffs into the lungs 4 (four) times daily as needed for wheezing or shortness of breath.  (Patient not taking: Reported on 09/19/2020), Disp: , Rfl:  .  penicillin v potassium (VEETID) 250 MG tablet, Take 250 mg by mouth 2 (two) times a day. (Patient not taking: Reported on 09/19/2020), Disp: , Rfl:   Current Facility-Administered Medications:  .  metoprolol tartrate (LOPRESSOR) tablet 25 mg, 25 mg, Oral, BID, Purcell Nails, MD  Past Medical History: Past Medical History:  Diagnosis Date  . Arthritis   . Avascular necrosis (HCC)   . CHF (congestive heart failure) (HCC)   . Chronic back pain   .  Depression   . Dyspnea   . GERD (gastroesophageal reflux disease)   . Heart murmur   . Mitral valve stenosis   . Panic attacks   . Pneumonia    hx 4 time  . Pulmonary hypertension (HCC)   . Rheumatic fever   . S/P minimally invasive mitral valve replacement with mechanical valve 05/14/2019   29 mm Sorin Carbomedics bileaflet mechanical valve via right mini thoracotomy approach    Tobacco Use: Social History   Tobacco Use  Smoking Status Never Smoker  Smokeless Tobacco Current User  . Types: Snuff    Labs: Recent Review Flowsheet Data    Labs for ITP Cardiac and Pulmonary Rehab Latest Ref Rng & Units 05/11/2019 05/14/2019 05/14/2019 05/14/2019 05/14/2019   Hemoglobin A1c 4.8 - 5.6 % 4.0(L) - - - -   PHART 7.35 - 7.45 7.399 7.346(L) 7.224(L) 7.312(L) 7.298(L)   PCO2ART 32 - 48 mmHg 41.5 48.8(H) 56.3(H) 46.6 49.5(H)   HCO3 20.0 - 28.0 mmol/L 25.1 26.7 23.3 24.0 24.6   TCO2 22 - 32 mmol/L - 28 25 26 26    ACIDBASEDEF 0.0 - 2.0 mmol/L - - 4.0(H) 3.0(H) 2.0   O2SAT % 98.3 100.0 92.0 97.0 99.0      Capillary Blood Glucose: Lab Results  Component Value Date   GLUCAP 120 (H) 05/16/2019   GLUCAP 123 (H) 05/16/2019   GLUCAP 100 (H) 05/16/2019   GLUCAP 88 05/15/2019   GLUCAP 102 (H) 05/15/2019     Pulmonary Assessment Scores:  Pulmonary Assessment Scores    Row Name 09/19/20 1015 09/19/20 1312       ADL UCSD   ADL Phase Entry --    SOB Score total 57 --      CAT Score   CAT Score 14 --      mMRC Score   mMRC Score -- 4          UCSD: Self-administered rating of dyspnea associated with activities of daily living (ADLs) 6-point scale (0 = "not at all" to 5 = "maximal or unable to do because of breathlessness")  Scoring Scores range from 0 to 120.  Minimally important difference is 5 units  CAT: CAT can identify the health impairment of COPD patients and is better correlated with disease progression.  CAT has a scoring range of zero to 40. The CAT score is classified into  four groups of low (less than 10), medium (10 - 20), high (21-30) and very high (31-40) based on the impact level of disease on health status. A  CAT score over 10 suggests significant symptoms.  A worsening CAT score could be explained by an exacerbation, poor medication adherence, poor inhaler technique, or progression of COPD or comorbid conditions.  CAT MCID is 2 points  mMRC: mMRC (Modified Medical Research Council) Dyspnea Scale is used to assess the degree of baseline functional disability in patients of respiratory disease due to dyspnea. No minimal important difference is established. A decrease in score of 1 point or greater is considered a positive change.   Pulmonary Function Assessment:   Exercise Target Goals: Exercise Program Goal: Individual exercise prescription set using results from initial 6 min walk test and THRR while considering  patient's activity barriers and safety.   Exercise Prescription Goal: Initial exercise prescription builds to 30-45 minutes a day of aerobic activity, 2-3 days per week.  Home exercise guidelines will be given to patient during program as part of exercise prescription that the participant will acknowledge.  Activity Barriers & Risk Stratification:  Activity Barriers & Cardiac Risk Stratification - 09/19/20 0955      Activity Barriers & Cardiac Risk Stratification   Activity Barriers Back Problems;Deconditioning;Left Hip Replacement;Muscular Weakness;Shortness of Breath           6 Minute Walk:  6 Minute Walk    Row Name 09/19/20 1313         6 Minute Walk   Phase Initial     Distance 1624 feet     Walk Time 6 minutes     # of Rest Breaks 0     MPH 3.08     METS 5.82     RPE 15     Perceived Dyspnea  2     VO2 Peak 20.38     Symptoms Yes (comment)  Hip pain a 7 on 1-10 scale     Comments Hip and back pain 7/10     Resting HR 93 bpm     Resting BP 112/70     Resting Oxygen Saturation  99 %     Exercise Oxygen Saturation   during 6 min walk 99 %     Max Ex. HR 107 bpm     Max Ex. BP 144/82     2 Minute Post BP 126/78       Interval HR   1 Minute HR 106     2 Minute HR 107     3 Minute HR 106     4 Minute HR 104     5 Minute HR 105     6 Minute HR 107     2 Minute Post HR 89     Interval Heart Rate? Yes       Interval Oxygen   Interval Oxygen? Yes     Baseline Oxygen Saturation % 99 %     1 Minute Oxygen Saturation % 99 %     1 Minute Liters of Oxygen 0 L     2 Minute Oxygen Saturation % 99 %     2 Minute Liters of Oxygen 0 L     3 Minute Oxygen Saturation % 99 %     3 Minute Liters of Oxygen 0 L     4 Minute Oxygen Saturation % 99 %     4 Minute Liters of Oxygen 0 L     5 Minute Oxygen Saturation % 99 %     5 Minute Liters of Oxygen 0 L     6 Minute Oxygen Saturation % 99 %  6 Minute Liters of Oxygen 0 L     2 Minute Post Oxygen Saturation % 100 %     2 Minute Post Liters of Oxygen 0 L            Oxygen Initial Assessment:  Oxygen Initial Assessment - 09/19/20 1311      Home Oxygen   Home Oxygen Device None    Sleep Oxygen Prescription None    Home Exercise Oxygen Prescription None    Home Resting Oxygen Prescription None      Initial 6 min Walk   Oxygen Used None      Program Oxygen Prescription   Program Oxygen Prescription None      Intervention   Short Term Goals To learn and understand importance of monitoring SPO2 with pulse oximeter and demonstrate accurate use of the pulse oximeter.;To learn and understand importance of maintaining oxygen saturations>88%;To learn and demonstrate proper pursed lip breathing techniques or other breathing techniques.;To learn and demonstrate proper use of respiratory medications    Long  Term Goals Verbalizes importance of monitoring SPO2 with pulse oximeter and return demonstration;Maintenance of O2 saturations>88%;Exhibits proper breathing techniques, such as pursed lip breathing or other method taught during program session;Compliance  with respiratory medication;Demonstrates proper use of MDI's           Oxygen Re-Evaluation:   Oxygen Discharge (Final Oxygen Re-Evaluation):   Initial Exercise Prescription:  Initial Exercise Prescription - 09/19/20 1300      Date of Initial Exercise RX and Referring Provider   Date 09/19/20    Referring Provider Dr. Mayford Knife    Expected Discharge Date 11/24/20      NuStep   Minutes 15      Track   Minutes 15      Prescription Details   Frequency (times per week) 2    Duration Progress to 30 minutes of continuous aerobic without signs/symptoms of physical distress      Intensity   THRR 40-80% of Max Heartrate 75-150    Ratings of Perceived Exertion 11-13    Perceived Dyspnea 0-4      Progression   Progression Continue to progress workloads to maintain intensity without signs/symptoms of physical distress.      Resistance Training   Training Prescription Yes    Weight Blue bands    Reps 10-15           Perform Capillary Blood Glucose checks as needed.  Exercise Prescription Changes:   Exercise Comments:   Exercise Goals and Review:  Exercise Goals    Row Name 09/19/20 1312             Exercise Goals   Increase Physical Activity Yes       Intervention Provide advice, education, support and counseling about physical activity/exercise needs.;Develop an individualized exercise prescription for aerobic and resistive training based on initial evaluation findings, risk stratification, comorbidities and participant's personal goals.       Expected Outcomes Short Term: Attend rehab on a regular basis to increase amount of physical activity.;Long Term: Add in home exercise to make exercise part of routine and to increase amount of physical activity.;Long Term: Exercising regularly at least 3-5 days a week.       Increase Strength and Stamina Yes       Intervention Provide advice, education, support and counseling about physical activity/exercise needs.;Develop  an individualized exercise prescription for aerobic and resistive training based on initial evaluation findings, risk stratification, comorbidities and participant's personal goals.  Expected Outcomes Short Term: Increase workloads from initial exercise prescription for resistance, speed, and METs.;Short Term: Perform resistance training exercises routinely during rehab and add in resistance training at home;Long Term: Improve cardiorespiratory fitness, muscular endurance and strength as measured by increased METs and functional capacity ( )       Able to understand and use rate of perceived exertion (RPE) scale Yes       Intervention Provide education and explanation on how to use RPE scale       Expected Outcomes Short Term: Able to use RPE daily in rehab to express subjective intensity level;Long Term:  Able to use RPE to guide intensity level when exercising independently       Able to understand and use Dyspnea scale Yes       Intervention Provide education and explanation on how to use Dyspnea scale       Expected Outcomes Short Term: Able to use Dyspnea scale daily in rehab to express subjective sense of shortness of breath during exertion;Long Term: Able to use Dyspnea scale to guide intensity level when exercising independently       Knowledge and understanding of Target Heart Rate Range (THRR) Yes       Intervention Provide education and explanation of THRR including how the numbers were predicted and where they are located for reference       Expected Outcomes Short Term: Able to state/look up THRR;Long Term: Able to use THRR to govern intensity when exercising independently;Short Term: Able to use daily as guideline for intensity in rehab       Understanding of Exercise Prescription Yes       Intervention Provide education, explanation, and written materials on patient's individual exercise prescription       Expected Outcomes Short Term: Able to explain program exercise  prescription;Long Term: Able to explain home exercise prescription to exercise independently              Exercise Goals Re-Evaluation :   Discharge Exercise Prescription (Final Exercise Prescription Changes):   Nutrition:  Target Goals: Understanding of nutrition guidelines, daily intake of sodium 1500mg , cholesterol 200mg , calories 30% from fat and 7% or less from saturated fats, daily to have 5 or more servings of fruits and vegetables.  Biometrics:  Pre Biometrics - 09/19/20 0955      Pre Biometrics   Height  (1.88 m)    Weight 96.8 kg    BMI (Calculated) 27.39    Grip Strength 48 kg            Nutrition Therapy Plan and Nutrition Goals:   Nutrition Assessments:   Nutrition Goals Re-Evaluation:   Nutrition Goals Discharge (Final Nutrition Goals Re-Evaluation):   Psychosocial: Target Goals: Acknowledge presence or absence of significant depression and/or stress, maximize coping skills, provide positive support system. Participant is able to verbalize types and ability to use techniques and skills needed for reducing stress and depression.  Initial Review & Psychosocial Screening:  Initial Psych Review & Screening - 09/19/20 1025      Initial Review   Current issues with History of Depression;Current Stress Concerns   Depression stable, but does have stress concerns from multiple health issues.  Has good coping skills.   Source of Stress Concerns Chronic Illness;Retirement/disability;Unable to participate in former interests or hobbies;Unable to perform yard/household activities    Comments Is disabled from chronic back pain, pulmonary hypertension, has had 6 hip surgeries in the left hip.  Family Dynamics   Good Support System? Yes    Comments No concerns at this time, depression is stable, handling his healthcare stress in healthy manners.      Barriers   Psychosocial barriers to participate in program The patient should benefit from training in  stress management and relaxation.      Screening Interventions   Interventions Encouraged to exercise    Expected Outcomes Long Term Goal: Stressors or current issues are controlled or eliminated.           Quality of Life Scores:  Scores of 19 and below usually indicate a poorer quality of life in these areas.  A difference of  2-3 points is a clinically meaningful difference.  A difference of 2-3 points in the total score of the Quality of Life Index has been associated with significant improvement in overall quality of life, self-image, physical symptoms, and general health in studies assessing change in quality of life.  PHQ-9: Recent Review Flowsheet Data    Depression screen [facility] 2/9 09/19/2020 07/31/2019 06/22/2019 06/18/2019   Decreased Interest 0 0 - 1   Down, Depressed, Hopeless 0 1 - 1   PHQ - 2 Score 0 1 - 2   Altered sleeping 0 - - 3   Tired, decreased energy 0 - - 3   Change in appetite 0 - - 0   Feeling bad or failure about yourself  0 - - 3   Trouble concentrating 0 - - 1   Moving slowly or fidgety/restless 0 - - 3   Suicidal thoughts 0 - - 0   PHQ-9 Score - - - 15   Difficult doing work/chores Not difficult at all - Somewhat difficult Somewhat difficult     Interpretation of Total Score  Total Score Depression Severity:  1-4 = Minimal depression, 5-9 = Mild depression, 10-14 = Moderate depression, 15-19 = Moderately severe depression, 20-27 = Severe depression   Psychosocial Evaluation and Intervention:  Psychosocial Evaluation - 09/19/20 1031      Psychosocial Evaluation & Interventions   Interventions Stress management education;Relaxation education;Encouraged to exercise with the program and follow exercise prescription    Comments Is currently handling his sress, unfortunately smokeless tobacco is being used to "destress".  Presently does not want to quit.    Expected Outcomes To move patient closer to wanting to quit.    Continue Psychosocial Services  Follow  up required by staff           Psychosocial Re-Evaluation:  Psychosocial Re-Evaluation    Row Name 09/19/20 1033             Psychosocial Re-Evaluation   Current issues with History of Depression;Current Stress Concerns              Psychosocial Discharge (Final Psychosocial Re-Evaluation):  Psychosocial Re-Evaluation - 09/19/20 1033      Psychosocial Re-Evaluation   Current issues with History of Depression;Current Stress Concerns           Education: Education Goals: Education classes will be provided on a weekly basis, covering required topics. Participant will state understanding/return demonstration of topics presented.  Learning Barriers/Preferences:   Education Topics: Risk Factor Reduction:  -Group instruction that is supported by a PowerPoint presentation. Instructor discusses the definition of a risk factor, different risk factors for pulmonary disease, and how the heart and lungs work together.     Nutrition for Pulmonary Patient:  -Group instruction provided by PowerPoint slides, verbal discussion, and written materials  to support subject matter. The instructor gives an explanation and review of healthy diet recommendations, which includes a discussion on weight management, recommendations for fruit and vegetable consumption, as well as protein, fluid, caffeine, fiber, sodium, sugar, and alcohol. Tips for eating when patients are short of breath are discussed.   Pursed Lip Breathing:  -Group instruction that is supported by demonstration and informational handouts. Instructor discusses the benefits of pursed lip and diaphragmatic breathing and detailed demonstration on how to preform both.     Oxygen Safety:  -Group instruction provided by PowerPoint, verbal discussion, and written material to support subject matter. There is an overview of "What is Oxygen" and "Why do we need it".  Instructor also reviews how to create a safe environment for oxygen use,  the importance of using oxygen as prescribed, and the risks of noncompliance. There is a brief discussion on traveling with oxygen and resources the patient may utilize.   Oxygen Equipment:  -Group instruction provided by [facility] Staff utilizing handouts, written materials, and equipment demonstrations.   Signs and Symptoms:  -Group instruction provided by written material and verbal discussion to support subject matter. Warning signs and symptoms of infection, stroke, and heart attack are reviewed and when to call the physician/911 reinforced. Tips for preventing the spread of infection discussed.   Advanced Directives:  -Group instruction provided by verbal instruction and written material to support subject matter. Instructor reviews Advanced Directive laws and proper instruction for filling out document.   Pulmonary Video:  -Group video education that reviews the importance of medication and oxygen compliance, exercise, good nutrition, pulmonary hygiene, and pursed lip and diaphragmatic breathing for the pulmonary patient.   Exercise for the Pulmonary Patient:  -Group instruction that is supported by a PowerPoint presentation. Instructor discusses benefits of exercise, core components of exercise, frequency, duration, and intensity of an exercise routine, importance of utilizing pulse oximetry during exercise, safety while exercising, and options of places to exercise outside of rehab.     Pulmonary Medications:  -Verbally interactive group education provided by instructor with focus on inhaled medications and proper administration.   Anatomy and Physiology of the Respiratory System and Intimacy:  -Group instruction provided by PowerPoint, verbal discussion, and written material to support subject matter. Instructor reviews respiratory cycle and anatomical components of the respiratory system and their functions. Instructor also reviews differences in obstructive and restrictive  respiratory diseases with examples of each. Intimacy, Sex, and Sexuality differences are reviewed with a discussion on how relationships can change when diagnosed with pulmonary disease. Common sexual concerns are reviewed.   MD DAY -A group question and answer session with a medical doctor that allows participants to ask questions that relate to their pulmonary disease state.   OTHER EDUCATION -Group or individual verbal, written, or video instructions that support the educational goals of the pulmonary rehab program.   Holiday Eating Survival Tips:  -Group instruction provided by PowerPoint slides, verbal discussion, and written materials to support subject matter. The instructor gives patients tips, tricks, and techniques to help them not only survive but enjoy the holidays despite the onslaught of food that accompanies the holidays.   Knowledge Questionnaire Score:  Knowledge Questionnaire Score - 09/19/20 1332      Knowledge Questionnaire Score   Pre Score 16/18           Core Components/Risk Factors/Patient Goals at Admission:  Personal Goals and Risk Factors at Admission - 09/19/20 1033      Core Components/Risk Factors/Patient  Goals on Admission   Tobacco Cessation Yes    Number of packs per day Uses smokeless tobacco continuously all day.    Intervention Assist the participant in steps to quit. Provide individualized education and counseling about committing to Tobacco Cessation, relapse prevention, and pharmacological support that can be provided by physician.    Expected Outcomes Short Term: Will demonstrate readiness to quit, by selecting a quit date.    Improve shortness of breath with ADL's Yes    Intervention Provide education, individualized exercise plan and daily activity instruction to help decrease symptoms of SOB with activities of daily living.    Expected Outcomes Short Term: Improve cardiorespiratory fitness to achieve a reduction of symptoms when performing  ADLs;Long Term: Be able to perform more ADLs without symptoms or delay the onset of symptoms           Core Components/Risk Factors/Patient Goals Review:   Goals and Risk Factor Review    Row Name 09/19/20 1035             Core Components/Risk Factors/Patient Goals Review   Personal Goals Review Tobacco Cessation;Improve shortness of breath with ADL's;Increase knowledge of respiratory medications and ability to use respiratory devices properly.;Develop more efficient breathing techniques such as purse lipped breathing and diaphragmatic breathing and practicing self-pacing with activity.              Core Components/Risk Factors/Patient Goals at Discharge (Final Review):   Goals and Risk Factor Review - 09/19/20 1035      Core Components/Risk Factors/Patient Goals Review   Personal Goals Review Tobacco Cessation;Improve shortness of breath with ADL's;Increase knowledge of respiratory medications and ability to use respiratory devices properly.;Develop more efficient breathing techniques such as purse lipped breathing and diaphragmatic breathing and practicing self-pacing with activity.           ITP Comments:   Comments:

## 2020-09-20 ENCOUNTER — Encounter (HOSPITAL_COMMUNITY): Payer: BC Managed Care – PPO

## 2020-09-21 ENCOUNTER — Ambulatory Visit (INDEPENDENT_AMBULATORY_CARE_PROVIDER_SITE_OTHER): Payer: BC Managed Care – PPO | Admitting: Internal Medicine

## 2020-09-21 DIAGNOSIS — Z954 Presence of other heart-valve replacement: Secondary | ICD-10-CM

## 2020-09-21 LAB — POCT INR: INR: 4.3 — AB (ref 2.0–3.0)

## 2020-09-22 ENCOUNTER — Encounter (HOSPITAL_COMMUNITY): Payer: BC Managed Care – PPO

## 2020-09-27 ENCOUNTER — Other Ambulatory Visit: Payer: Self-pay

## 2020-09-27 ENCOUNTER — Encounter (HOSPITAL_COMMUNITY)
Admission: RE | Admit: 2020-09-27 | Discharge: 2020-09-27 | Disposition: A | Discharge status: Home / Self Care | Payer: BC Managed Care – PPO | Source: Ambulatory Visit | Attending: Cardiology | Admitting: Cardiology

## 2020-09-27 VITALS — Wt 214.7 lb

## 2020-09-27 DIAGNOSIS — J984 Other disorders of lung: Secondary | ICD-10-CM | POA: Diagnosis not present

## 2020-09-27 NOTE — Progress Notes (Signed)
Daily Session Note  Patient Details  Name: Darren Haynes MRN: [id number] Date of Birth: December 04, 1985 Referring Provider:     Pulmonary Rehab Walk Test from 09/19/2020 in [place]  Referring Provider Dr. Radford Pax      Encounter Date: 09/27/2020  Check In:  Session Check In - 09/27/20 1126      Check-In   Supervising physician immediately available to respond to emergencies [facility] immediately available    Physician(s) Dr. Ree Kida    Location [facility]    Staff Present Rosebud Poles, RN, Isaac Laud, MS, ACSM-CEP, Exercise Physiologist;Lisa Ysidro Evert, RN    Virtual Visit No    Medication changes reported     No    Fall or balance concerns reported    No    Tobacco Cessation No Change    Warm-up and Cool-down Performed as group-led instruction    Resistance Training Performed Yes    VAD Patient? No    PAD/SET Patient? No      Pain Assessment   Currently in Pain? No/denies    Multiple Pain Sites No           Capillary Blood Glucose: No results found for this or any previous visit (from the past 24 hour(s)).   Exercise Prescription Changes - 09/27/20 1100      Response to Exercise   Blood Pressure (Admit) 104/60    Blood Pressure (Exercise) 110/70    Blood Pressure (Exit) 112/70    Heart Rate (Admit) 82 bpm    Heart Rate (Exercise) 108 bpm    Heart Rate (Exit) 94 bpm    Oxygen Saturation (Admit) 98 %    Oxygen Saturation (Exercise) 97 %    Oxygen Saturation (Exit) 98 %    Rating of Perceived Exertion (Exercise) 15    Perceived Dyspnea (Exercise) 2    Symptoms Hip and back pain    Duration Continue with 30 min of aerobic exercise without signs/symptoms of physical distress.    Intensity Other (comment)   40%-80% of THR max     Progression   Progression Continue to progress workloads to maintain intensity without signs/symptoms of physical distress.      Resistance Training   Training Prescription  Yes    Weight Blue bands    Reps 10-15    Time 10 Minutes      NuStep   Level 2    Minutes 15      Track   Laps 14    Minutes 15    METs 2.6           Social History   Tobacco Use  Smoking Status Never Smoker  Smokeless Tobacco Current User  . Types: Snuff    Goals Met:  Proper associated with RPD/PD & O2 Sat Exercise tolerated well No report of cardiac concerns or symptoms Strength training completed today  Goals Unmet:  Not Applicable  Comments: Service time is from 1010 to 1115    Dr. Fransico Him is Medical Director for Cardiac Rehab at [facility].

## 2020-09-29 ENCOUNTER — Telehealth (HOSPITAL_COMMUNITY): Payer: Self-pay

## 2020-09-29 ENCOUNTER — Encounter (HOSPITAL_COMMUNITY): Payer: BC Managed Care – PPO

## 2020-10-03 ENCOUNTER — Telehealth (HOSPITAL_COMMUNITY): Payer: Self-pay | Admitting: *Deleted

## 2020-10-03 NOTE — Progress Notes (Signed)
Pulmonary Individual Treatment Plan  Patient Details  Name: Darren Haynes MRN: [id number] Date of Birth: 28-Feb-1986 Referring Provider:     Pulmonary Rehab Walk Test from 09/19/2020 in [facility] CARDI[facility]  Referring Provider Dr. Mayford Knife      Initial Encounter Date:    Pulmonary Rehab Walk Test from 09/19/2020 in [facility] CARDIAC REHAB  Date 09/19/20      Visit Diagnosis: Restrictive lung disease  Patient's Home Medications on Admission:   Current Outpatient Medications:  .  ADVAIR HFA 115-21 MCG/ACT inhaler, Inhale 1 puff into the lungs 2 (two) times daily. (Patient not taking: Reported on 09/19/2020), Disp: , Rfl:  .  albuterol (VENTOLIN HFA) 108 (90 Base) MCG/ACT inhaler, Inhale 1-2 puffs into the lungs 4 (four) times daily as needed for wheezing or shortness of breath.  (Patient not taking: Reported on 09/19/2020), Disp: , Rfl:  .  Apremilast (OTEZLA) 30 MG TABS, Take 30 mg by mouth in the morning and at bedtime., Disp: , Rfl:  .  aspirin EC 81 MG tablet, Take 81 mg by mouth daily., Disp: , Rfl:  .  Buprenorphine HCl (BELBUCA) 900 MCG FILM, Place 2 Film inside cheek daily. , Disp: , Rfl:  .  busPIRone (BUSPAR) 15 MG tablet, Take 15 mg by mouth 3 (three) times daily., Disp: , Rfl:  .  celecoxib (CELEBREX) 200 MG capsule, Take 200 mg by mouth daily. , Disp: , Rfl:  .  Cholecalciferol (VITAMIN D-1000 MAX ST) 25 MCG (1000 UT) tablet, Take 1,000 Units by mouth once a week. , Disp: , Rfl:  .  clonazePAM (KLONOPIN) 0.5 MG tablet, Take 0.5 mg by mouth at bedtime., Disp: , Rfl:  .  Desvenlafaxine Succinate ER 25 MG TB24, Take 25 mg by mouth 2 (two) times daily., Disp: , Rfl:  .  diclofenac Sodium (VOLTAREN) 1 % GEL, SMARTSIG:2 Gram(s) Topical 3 Times Daily PRN, Disp: , Rfl:  .  folic acid (FOLVITE) 1 MG tablet, Take by mouth. , Disp: , Rfl:  .  furosemide (LASIX) 40 MG tablet, Take 1 tablet (40 mg total) by mouth daily. You may take an extra 20mg  as  needed for volume overload., Disp: 45 tablet, Rfl: 3 .  gabapentin (NEURONTIN) 800 MG tablet, Take 800 mg by mouth 3 (three) times daily., Disp: , Rfl:  .  hydroxychloroquine (PLAQUENIL) 200 MG tablet, Take by mouth daily. , Disp: , Rfl:  .  leflunomide (ARAVA) 20 MG tablet, Take 20 mg by mouth daily., Disp: , Rfl:  .  metoprolol succinate (TOPROL-XL) 50 MG 24 hr tablet, Take 50 mg by mouth daily., Disp: , Rfl:  .  metoprolol tartrate (LOPRESSOR) 25 MG tablet, Take 25 mg by mouth 2 (two) times daily., Disp: , Rfl:  .  nortriptyline (PAMELOR) 25 MG capsule, Take 25 mg by mouth at bedtime. , Disp: , Rfl:  .  omeprazole (PRILOSEC) 40 MG capsule, Take 40 mg by mouth daily., Disp: , Rfl:  .  ondansetron (ZOFRAN) 4 MG tablet, TAKE 1 TABLET BY MOUTH THREE TIMES DAILY, Disp: , Rfl:  .  Oxycodone HCl 10 MG TABS, Take 15 mg by mouth every 4 (four) hours as needed (pain). , Disp: , Rfl:  .  penicillin v potassium (VEETID) 250 MG tablet, Take 250 mg by mouth 2 (two) times a day. (Patient not taking: Reported on 09/19/2020), Disp: , Rfl:  .  potassium chloride (KLOR-CON) 10 MEQ tablet, Take 10 mEq by mouth  daily., Disp: , Rfl:  .  pravastatin (PRAVACHOL) 20 MG tablet, Take 20 mg by mouth daily., Disp: , Rfl:  .  risperiDONE (RISPERDAL) 0.25 MG tablet, Take 0.25 mg by mouth at bedtime., Disp: , Rfl:  .  tiZANidine (ZANAFLEX) 4 MG tablet, Take 4 mg by mouth 3 (three) times daily., Disp: , Rfl:  .  warfarin (COUMADIN) 5 MG tablet, TAKE 1 TO 1.5 TABLETS BY MOUTH DAILY AS DIRECTED BY COUMADIN CLINIC, Disp: 120 tablet, Rfl: 1 .  XTAMPZA ER 36 MG C12A, Take 1 capsule by mouth 2 (two) times daily., Disp: , Rfl:   Current Facility-Administered Medications:  .  metoprolol tartrate (LOPRESSOR) tablet 25 mg, 25 mg, Oral, BID, Purcell Nails, MD  Past Medical History: Past Medical History:  Diagnosis Date  . Arthritis   . Avascular necrosis (HCC)   . CHF (congestive heart failure) (HCC)   . Chronic back pain   .  Depression   . Dyspnea   . GERD (gastroesophageal reflux disease)   . Heart murmur   . Mitral valve stenosis   . Panic attacks   . Pneumonia    hx 4 time  . Pulmonary hypertension (HCC)   . Rheumatic fever   . S/P minimally invasive mitral valve replacement with mechanical valve 05/14/2019   29 mm Sorin [employer]omed[employer] bileaflet mechanical valve via right mini thoracotomy approach    Tobacco Use: Social History   Tobacco Use  Smoking Status Never Smoker  Smokeless Tobacco Current User  . Types: Snuff    Labs: Recent Review Flowsheet Data    Labs for ITP Cardiac and Pulmonary Rehab Latest Ref Rng & Units 05/11/2019 05/14/2019 05/14/2019 05/14/2019 05/14/2019   Hemoglobin A1c 4.8 - 5.6 % 4.0(L) - - - -   PHART 7.35 - 7.45 7.399 7.346(L) 7.224(L) 7.312(L) 7.298(L)   PCO2ART 32 - 48 mmHg 41.5 48.8(H) 56.3(H) 46.6 49.5(H)   HCO3 20.0 - 28.0 mmol/L 25.1 26.7 23.3 24.0 24.6   TCO2 22 - 32 mmol/L - 28 25 26 26    ACIDBASEDEF 0.0 - 2.0 mmol/L - - 4.0(H) 3.0(H) 2.0   O2SAT % 98.3 100.0 92.0 97.0 99.0      Capillary Blood Glucose: Lab Results  Component Value Date   GLUCAP 120 (H) 05/16/2019   GLUCAP 123 (H) 05/16/2019   GLUCAP 100 (H) 05/16/2019   GLUCAP 88 05/15/2019   GLUCAP 102 (H) 05/15/2019     Pulmonary Assessment Scores:  Pulmonary Assessment Scores    Row Name 09/19/20 1015 09/19/20 1312       ADL UCSD   ADL Phase Entry --    SOB Score total 57 --      CAT Score   CAT Score 14 --      mMRC Score   mMRC Score -- 4          UCSD: Self-administered rating of dyspnea associated with activities of daily living (ADLs) 6-point scale (0 = "not at all" to 5 = "maximal or unable to do because of breathlessness")  Scoring Scores range from 0 to 120.  Minimally important difference is 5 units  CAT: CAT can identify the health impairment of COPD patients and is better correlated with disease progression.  CAT has a scoring range of zero to 40. The CAT score is classified into  four groups of low (less than 10), medium (10 - 20), high (21-30) and very high (31-40) based on the impact level of disease on health status. A  CAT score over 10 suggests significant symptoms.  A worsening CAT score could be explained by an exacerbation, poor medication adherence, poor inhaler technique, or progression of COPD or comorbid conditions.  CAT MCID is 2 points  mMRC: mMRC (Modified Medical Research Council) Dyspnea Scale is used to assess the degree of baseline functional disability in patients of respiratory disease due to dyspnea. No minimal important difference is established. A decrease in score of 1 point or greater is considered a positive change.   Pulmonary Function Assessment:   Exercise Target Goals: Exercise Program Goal: Individual exercise prescription set using results from initial 6 min walk test and THRR while considering  patient's activity barriers and safety.   Exercise Prescription Goal: Initial exercise prescription builds to 30-45 minutes a day of aerobic activity, 2-3 days per week.  Home exercise guidelines will be given to patient during program as part of exercise prescription that the participant will acknowledge.  Activity Barriers & Risk Stratification:  Activity Barriers & Cardiac Risk Stratification - 09/19/20 0955      Activity Barriers & Cardiac Risk Stratification   Activity Barriers Back Problems;Deconditioning;Left Hip Replacement;Muscular Weakness;Shortness of Breath           6 Minute Walk:  6 Minute Walk    Row Name 09/19/20 1313         6 Minute Walk   Phase Initial     Distance 1624 feet     Walk Time 6 minutes     # of Rest Breaks 0     MPH 3.08     METS 5.82     RPE 15     Perceived Dyspnea  2     VO2 Peak 20.38     Symptoms Yes (comment)  Hip pain a 7 on 1-10 scale     Comments Hip and back pain 7/10     Resting HR 93 bpm     Resting BP 112/70     Resting Oxygen Saturation  99 %     Exercise Oxygen Saturation   during 6 min walk 99 %     Max Ex. HR 107 bpm     Max Ex. BP 144/82     2 Minute Post BP 126/78       Interval HR   1 Minute HR 106     2 Minute HR 107     3 Minute HR 106     4 Minute HR 104     5 Minute HR 105     6 Minute HR 107     2 Minute Post HR 89     Interval Heart Rate? Yes       Interval Oxygen   Interval Oxygen? Yes     Baseline Oxygen Saturation % 99 %     1 Minute Oxygen Saturation % 99 %     1 Minute Liters of Oxygen 0 L     2 Minute Oxygen Saturation % 99 %     2 Minute Liters of Oxygen 0 L     3 Minute Oxygen Saturation % 99 %     3 Minute Liters of Oxygen 0 L     4 Minute Oxygen Saturation % 99 %     4 Minute Liters of Oxygen 0 L     5 Minute Oxygen Saturation % 99 %     5 Minute Liters of Oxygen 0 L     6 Minute Oxygen Saturation % 99 %  6 Minute Liters of Oxygen 0 L     2 Minute Post Oxygen Saturation % 100 %     2 Minute Post Liters of Oxygen 0 L            Oxygen Initial Assessment:  Oxygen Initial Assessment - 09/19/20 1311      Home Oxygen   Home Oxygen Device None    Sleep Oxygen Prescription None    Home Exercise Oxygen Prescription None    Home Resting Oxygen Prescription None      Initial 6 min Walk   Oxygen Used None      Program Oxygen Prescription   Program Oxygen Prescription None      Intervention   Short Term Goals To learn and understand importance of monitoring SPO2 with pulse oximeter and demonstrate accurate use of the pulse oximeter.;To learn and understand importance of maintaining oxygen saturations>88%;To learn and demonstrate proper pursed lip breathing techniques or other breathing techniques.;To learn and demonstrate proper use of respiratory medications    Long  Term Goals Verbalizes importance of monitoring SPO2 with pulse oximeter and return demonstration;Maintenance of O2 saturations>88%;Exhibits proper breathing techniques, such as pursed lip breathing or other method taught during program session;Compliance  with respiratory medication;Demonstrates proper use of MDI's           Oxygen Re-Evaluation:  Oxygen Re-Evaluation    Row Name 09/29/20 0847             Program Oxygen Prescription   Program Oxygen Prescription None         Home Oxygen   Home Oxygen Device None       Sleep Oxygen Prescription None       Home Exercise Oxygen Prescription None       Home Resting Oxygen Prescription None         Goals/Expected Outcomes   Short Term Goals To learn and understand importance of monitoring SPO2 with pulse oximeter and demonstrate accurate use of the pulse oximeter.;To learn and understand importance of maintaining oxygen saturations>88%;To learn and demonstrate proper pursed lip breathing techniques or other breathing techniques.;To learn and demonstrate proper use of respiratory medications       Long  Term Goals Verbalizes importance of monitoring SPO2 with pulse oximeter and return demonstration;Maintenance of O2 saturations>88%;Exhibits proper breathing techniques, such as pursed lip breathing or other method taught during program session;Compliance with respiratory medication;Demonstrates proper use of MDI's       Goals/Expected Outcomes compliance and understanding of oxygen saturation and pursed lip breathing              Oxygen Discharge (Final Oxygen Re-Evaluation):  Oxygen Re-Evaluation - 09/29/20 0847      Program Oxygen Prescription   Program Oxygen Prescription None      Home Oxygen   Home Oxygen Device None    Sleep Oxygen Prescription None    Home Exercise Oxygen Prescription None    Home Resting Oxygen Prescription None      Goals/Expected Outcomes   Short Term Goals To learn and understand importance of monitoring SPO2 with pulse oximeter and demonstrate accurate use of the pulse oximeter.;To learn and understand importance of maintaining oxygen saturations>88%;To learn and demonstrate proper pursed lip breathing techniques or other breathing techniques.;To  learn and demonstrate proper use of respiratory medications    Long  Term Goals Verbalizes importance of monitoring SPO2 with pulse oximeter and return demonstration;Maintenance of O2 saturations>88%;Exhibits proper breathing techniques, such as pursed lip breathing or other  method taught during program session;Compliance with respiratory medication;Demonstrates proper use of MDI's    Goals/Expected Outcomes compliance and understanding of oxygen saturation and pursed lip breathing           Initial Exercise Prescription:  Initial Exercise Prescription - 09/19/20 1300      Date of Initial Exercise RX and Referring Provider   Date 09/19/20    Referring Provider Dr. Mayford Knife    Expected Discharge Date 11/24/20      NuStep   Minutes 15      Track   Minutes 15      Prescription Details   Frequency (times per week) 2    Duration Progress to 30 minutes of continuous aerobic without signs/symptoms of physical distress      Intensity   THRR 40-80% of Max Heartrate 75-150    Ratings of Perceived Exertion 11-13    Perceived Dyspnea 0-4      Progression   Progression Continue to progress workloads to maintain intensity without signs/symptoms of physical distress.      Resistance Training   Training Prescription Yes    Weight Blue bands    Reps 10-15           Perform Capillary Blood Glucose checks as needed.  Exercise Prescription Changes:  Exercise Prescription Changes    Row Name 09/27/20 1100             Response to Exercise   Blood Pressure (Admit) 104/60       Blood Pressure (Exercise) 110/70       Blood Pressure (Exit) 112/70       Heart Rate (Admit) 82 bpm       Heart Rate (Exercise) 108 bpm       Heart Rate (Exit) 94 bpm       Oxygen Saturation (Admit) 98 %       Oxygen Saturation (Exercise) 97 %       Oxygen Saturation (Exit) 98 %       Rating of Perceived Exertion (Exercise) 15       Perceived Dyspnea (Exercise) 2       Symptoms Hip and back pain        Duration Continue with 30 min of aerobic exercise without signs/symptoms of physical distress.       Intensity Other (comment)  40%-80% of THR max         Progression   Progression Continue to progress workloads to maintain intensity without signs/symptoms of physical distress.         Resistance Training   Training Prescription Yes       Weight Blue bands       Reps 10-15       Time 10 Minutes         NuStep   Level 2       Minutes 15         Track   Laps 14       Minutes 15       METs 2.6              Exercise Comments:  Exercise Comments    Row Name 09/27/20 1142           Exercise Comments Pt completed first day of exercise and tolerated well with minor complaints of hip and back pain. He stated he always has pain due to numerous hip surgeries. Will continue to monitor.  Exercise Goals and Review:  Exercise Goals    Row Name 09/19/20 1312             Exercise Goals   Increase Physical Activity Yes       Intervention Provide advice, education, support and counseling about physical activity/exercise needs.;Develop an individualized exercise prescription for aerobic and resistive training based on initial evaluation findings, risk stratification, comorbidities and participant's personal goals.       Expected Outcomes Short Term: Attend rehab on a regular basis to increase amount of physical activity.;Long Term: Add in home exercise to make exercise part of routine and to increase amount of physical activity.;Long Term: Exercising regularly at least 3-5 days a week.       Increase Strength and Stamina Yes       Intervention Provide advice, education, support and counseling about physical activity/exercise needs.;Develop an individualized exercise prescription for aerobic and resistive training based on initial evaluation findings, risk stratification, comorbidities and participant's personal goals.       Expected Outcomes Short Term: Increase workloads  from initial exercise prescription for resistance, speed, and METs.;Short Term: Perform resistance training exercises routinely during rehab and add in resistance training at home;Long Term: Improve cardiorespiratory fitness, muscular endurance and strength as measured by increased METs and functional capacity ( )       Able to understand and use rate of perceived exertion (RPE) scale Yes       Intervention Provide education and explanation on how to use RPE scale       Expected Outcomes Short Term: Able to use RPE daily in rehab to express subjective intensity level;Long Term:  Able to use RPE to guide intensity level when exercising independently       Able to understand and use Dyspnea scale Yes       Intervention Provide education and explanation on how to use Dyspnea scale       Expected Outcomes Short Term: Able to use Dyspnea scale daily in rehab to express subjective sense of shortness of breath during exertion;Long Term: Able to use Dyspnea scale to guide intensity level when exercising independently       Knowledge and understanding of Target Heart Rate Range (THRR) Yes       Intervention Provide education and explanation of THRR including how the numbers were predicted and where they are located for reference       Expected Outcomes Short Term: Able to state/look up THRR;Long Term: Able to use THRR to govern intensity when exercising independently;Short Term: Able to use daily as guideline for intensity in rehab       Understanding of Exercise Prescription Yes       Intervention Provide education, explanation, and written materials on patient's individual exercise prescription       Expected Outcomes Short Term: Able to explain program exercise prescription;Long Term: Able to explain home exercise prescription to exercise independently              Exercise Goals Re-Evaluation :  Exercise Goals Re-Evaluation    Row Name 09/29/20 0846             Exercise Goal Re-Evaluation    Exercise Goals Review Increase Physical Activity;Increase Strength and Stamina;Able to understand and use rate of perceived exertion (RPE) scale;Able to understand and use Dyspnea scale;Knowledge and understanding of Target Heart Rate Range (THRR);Understanding of Exercise Prescription       Comments Pt has completed 1 exercise session and tolerated well with no  complaints or concerns. He is exercising at 2.6 METS on the track. Will continue to monitor and progress as he is able.       Expected Outcomes Through exercise at rehab and home the patient will decrease shortness of breath with daily activities and feel confident in carrying out an exercise regimn at home.              Discharge Exercise Prescription (Final Exercise Prescription Changes):  Exercise Prescription Changes - 09/27/20 1100      Response to Exercise   Blood Pressure (Admit) 104/60    Blood Pressure (Exercise) 110/70    Blood Pressure (Exit) 112/70    Heart Rate (Admit) 82 bpm    Heart Rate (Exercise) 108 bpm    Heart Rate (Exit) 94 bpm    Oxygen Saturation (Admit) 98 %    Oxygen Saturation (Exercise) 97 %    Oxygen Saturation (Exit) 98 %    Rating of Perceived Exertion (Exercise) 15    Perceived Dyspnea (Exercise) 2    Symptoms Hip and back pain    Duration Continue with 30 min of aerobic exercise without signs/symptoms of physical distress.    Intensity Other (comment)   40%-80% of THR max     Progression   Progression Continue to progress workloads to maintain intensity without signs/symptoms of physical distress.      Resistance Training   Training Prescription Yes    Weight Blue bands    Reps 10-15    Time 10 Minutes      NuStep   Level 2    Minutes 15      Track   Laps 14    Minutes 15    METs 2.6           Nutrition:  Target Goals: Understanding of nutrition guidelines, daily intake of sodium 1500mg , cholesterol 200mg , calories 30% from fat and 7% or less from saturated fats, daily to  have 5 or more servings of fruits and vegetables.  Biometrics:  Pre Biometrics - 09/19/20 0955      Pre Biometrics   Height  (1.88 m)    Weight 96.8 kg    BMI (Calculated) 27.39    Grip Strength 48 kg            Nutrition Therapy Plan and Nutrition Goals:   Nutrition Assessments:  MEDIFICTS Score Key:  ?70 Need to make dietary changes   40-70 Heart Healthy Diet  ? 40 Therapeutic Level Cholesterol Diet   Picture Your Plate Scores:  <16 Unhealthy dietary pattern with much room for improvement.  41-50 Dietary pattern unlikely to meet recommendations for good health and room for improvement.  51-60 More healthful dietary pattern, with some room for improvement.   >60 Healthy dietary pattern, although there may be some specific behaviors that could be improved.    Nutrition Goals Re-Evaluation:   Nutrition Goals Discharge (Final Nutrition Goals Re-Evaluation):   Psychosocial: Target Goals: Acknowledge presence or absence of significant depression and/or stress, maximize coping skills, provide positive support system. Participant is able to verbalize types and ability to use techniques and skills needed for reducing stress and depression.  Initial Review & Psychosocial Screening:  Initial Psych Review & Screening - 09/19/20 1025      Initial Review   Current issues with History of Depression;Current Stress Concerns   Depression stable, but does have stress concerns from multiple health issues.  Has good coping skills.   Source of Stress Concerns  Chronic Illness;Retirement/disability;Unable to participate in former interests or hobbies;Unable to perform yard/household activities    Comments Is disabled from chronic back pain, pulmonary hypertension, has had 6 hip surgeries in the left hip.      Family Dynamics   Good Support System? Yes    Comments No concerns at this time, depression is stable, handling his healthcare stress in healthy manners.       Barriers   Psychosocial barriers to participate in program The patient should benefit from training in stress management and relaxation.      Screening Interventions   Interventions Encouraged to exercise    Expected Outcomes Long Term Goal: Stressors or current issues are controlled or eliminated.           Quality of Life Scores:  Scores of 19 and below usually indicate a poorer quality of life in these areas.  A difference of  2-3 points is a clinically meaningful difference.  A difference of 2-3 points in the total score of the Quality of Life Index has been associated with significant improvement in overall quality of life, self-image, physical symptoms, and general health in studies assessing change in quality of life.  PHQ-9: Recent Review Flowsheet Data    Depression screen [facility] 2/9 09/19/2020 07/31/2019 06/22/2019 06/18/2019   Decreased Interest 0 0 - 1   Down, Depressed, Hopeless 0 1 - 1   PHQ - 2 Score 0 1 - 2   Altered sleeping 0 - - 3   Tired, decreased energy 0 - - 3   Change in appetite 0 - - 0   Feeling bad or failure about yourself  0 - - 3   Trouble concentrating 0 - - 1   Moving slowly or fidgety/restless 0 - - 3   Suicidal thoughts 0 - - 0   PHQ-9 Score - - - 15   Difficult doing work/chores Not difficult at all - Somewhat difficult Somewhat difficult     Interpretation of Total Score  Total Score Depression Severity:  1-4 = Minimal depression, 5-9 = Mild depression, 10-14 = Moderate depression, 15-19 = Moderately severe depression, 20-27 = Severe depression   Psychosocial Evaluation and Intervention:  Psychosocial Evaluation - 09/19/20 1031      Psychosocial Evaluation & Interventions   Interventions Stress management education;Relaxation education;Encouraged to exercise with the program and follow exercise prescription    Comments Is currently handling his sress, unfortunately smokeless tobacco is being used to "destress".  Presently does not want to quit.     Expected Outcomes To move patient closer to wanting to quit.    Continue Psychosocial Services  Follow up required by staff           Psychosocial Re-Evaluation:  Psychosocial Re-Evaluation    Row Name 09/19/20 1033 10/03/20 1020           Psychosocial Re-Evaluation   Current issues with History of Depression;Current Stress Concerns Current Depression;History of Depression;Current Stress Concerns      Comments -- Just started pulmonary rehab, has exercised once with [place], no change in psychosocial concerns as on initial orientation/walk test.      Expected Outcomes -- For Justun to be free of psychsoocial concerns while participating in pulmonary rehab.      Interventions -- Encouraged to attend Pulmonary Rehabilitation for the exercise;Relaxation education;Stress management education      Continue Psychosocial Services  -- Follow up required by staff      Comments -- His antidepressant medications keep  Timon stable at this point.        Initial Review   Source of Stress Concerns -- Chronic Illness;Unable to participate in former interests or hobbies;Unable to perform yard/household activities;Retirement/disability             Psychosocial Discharge (Final Psychosocial Re-Evaluation):  Psychosocial Re-Evaluation - 10/03/20 1020      Psychosocial Re-Evaluation   Current issues with Current Depression;History of Depression;Current Stress Concerns    Comments Just started pulmonary rehab, has exercised once with [place], no change in psychosocial concerns as on initial orientation/walk test.    Expected Outcomes For Benjimin to be free of psychsoocial concerns while participating in pulmonary rehab.    Interventions Encouraged to attend Pulmonary Rehabilitation for the exercise;Relaxation education;Stress management education    Continue Psychosocial Services  Follow up required by staff    Comments His antidepressant medications keep Nalu stable at this point.      Initial Review    Source of Stress Concerns Chronic Illness;Unable to participate in former interests or hobbies;Unable to perform yard/household activities;Retirement/disability           Education: Education Goals: Education classes will be provided on a weekly basis, covering required topics. Participant will state understanding/return demonstration of topics presented.  Learning Barriers/Preferences:   Education Topics: Risk Factor Reduction:  -Group instruction that is supported by a PowerPoint presentation. Instructor discusses the definition of a risk factor, different risk factors for pulmonary disease, and how the heart and lungs work together.     Nutrition for Pulmonary Patient:  -Group instruction provided by PowerPoint slides, verbal discussion, and written materials to support subject matter. The instructor gives an explanation and review of healthy diet recommendations, which includes a discussion on weight management, recommendations for fruit and vegetable consumption, as well as protein, fluid, caffeine, fiber, sodium, sugar, and alcohol. Tips for eating when patients are short of breath are discussed.   Pursed Lip Breathing:  -Group instruction that is supported by demonstration and informational handouts. Instructor discusses the benefits of pursed lip and diaphragmatic breathing and detailed demonstration on how to preform both.     Oxygen Safety:  -Group instruction provided by PowerPoint, verbal discussion, and written material to support subject matter. There is an overview of "What is Oxygen" and "Why do we need it".  Instructor also reviews how to create a safe environment for oxygen use, the importance of using oxygen as prescribed, and the risks of noncompliance. There is a brief discussion on traveling with oxygen and resources the patient may utilize.   Oxygen Equipment:  -Group instruction provided by [facility] Staff utilizing handouts, written materials, and equipment  demonstrations.   Signs and Symptoms:  -Group instruction provided by written material and verbal discussion to support subject matter. Warning signs and symptoms of infection, stroke, and heart attack are reviewed and when to call the physician/911 reinforced. Tips for preventing the spread of infection discussed.   Advanced Directives:  -Group instruction provided by verbal instruction and written material to support subject matter. Instructor reviews Advanced Directive laws and proper instruction for filling out document.   Pulmonary Video:  -Group video education that reviews the importance of medication and oxygen compliance, exercise, good nutrition, pulmonary hygiene, and pursed lip and diaphragmatic breathing for the pulmonary patient.   Exercise for the Pulmonary Patient:  -Group instruction that is supported by a PowerPoint presentation. Instructor discusses benefits of exercise, core components of exercise, frequency, duration, and intensity of an exercise routine, importance  of utilizing pulse oximetry during exercise, safety while exercising, and options of places to exercise outside of rehab.     Pulmonary Medications:  -Verbally interactive group education provided by instructor with focus on inhaled medications and proper administration.   Anatomy and Physiology of the Respiratory System and Intimacy:  -Group instruction provided by PowerPoint, verbal discussion, and written material to support subject matter. Instructor reviews respiratory cycle and anatomical components of the respiratory system and their functions. Instructor also reviews differences in obstructive and restrictive respiratory diseases with examples of each. Intimacy, Sex, and Sexuality differences are reviewed with a discussion on how relationships can change when diagnosed with pulmonary disease. Common sexual concerns are reviewed.   MD DAY -A group question and answer session with a medical doctor  that allows participants to ask questions that relate to their pulmonary disease state.   OTHER EDUCATION -Group or individual verbal, written, or video instructions that support the educational goals of the pulmonary rehab program.   Holiday Eating Survival Tips:  -Group instruction provided by PowerPoint slides, verbal discussion, and written materials to support subject matter. The instructor gives patients tips, tricks, and techniques to help them not only survive but enjoy the holidays despite the onslaught of food that accompanies the holidays.   Knowledge Questionnaire Score:  Knowledge Questionnaire Score - 09/19/20 1332      Knowledge Questionnaire Score   Pre Score 16/18           Core Components/Risk Factors/Patient Goals at Admission:  Personal Goals and Risk Factors at Admission - 09/19/20 1033      Core Components/Risk Factors/Patient Goals on Admission   Tobacco Cessation Yes    Number of packs per day Uses smokeless tobacco continuously all day.    Intervention Assist the participant in steps to quit. Provide individualized education and counseling about committing to Tobacco Cessation, relapse prevention, and pharmacological support that can be provided by physician.    Expected Outcomes Short Term: Will demonstrate readiness to quit, by selecting a quit date.    Improve shortness of breath with ADL's Yes    Intervention Provide education, individualized exercise plan and daily activity instruction to help decrease symptoms of SOB with activities of daily living.    Expected Outcomes Short Term: Improve cardiorespiratory fitness to achieve a reduction of symptoms when performing ADLs;Long Term: Be able to perform more ADLs without symptoms or delay the onset of symptoms           Core Components/Risk Factors/Patient Goals Review:   Goals and Risk Factor Review    Row Name 09/19/20 1035 10/03/20 1025           Core Components/Risk Factors/Patient Goals  Review   Personal Goals Review Tobacco Cessation;Improve shortness of breath with ADL's;Increase knowledge of respiratory medications and ability to use respiratory devices properly.;Develop more efficient breathing techniques such as purse lipped breathing and diaphragmatic breathing and practicing self-pacing with activity. Tobacco Cessation;Increase knowledge of respiratory medications and ability to use respiratory devices properly.;Improve shortness of breath with ADL's;Develop more efficient breathing techniques such as purse lipped breathing and diaphragmatic breathing and practicing self-pacing with activity.      Review -- Just started the program, has attended one session and developed more hip pain than normal and is being evaluated. He is still using smokeless tobacco and is not ready to quit yet.      Expected Outcomes -- For Corky to improve his strength, stamina, and shortness of breath and move him  closed to becoming tobacco free.             Core Components/Risk Factors/Patient Goals at Discharge (Final Review):   Goals and Risk Factor Review - 10/03/20 1025      Core Components/Risk Factors/Patient Goals Review   Personal Goals Review Tobacco Cessation;Increase knowledge of respiratory medications and ability to use respiratory devices properly.;Improve shortness of breath with ADL's;Develop more efficient breathing techniques such as purse lipped breathing and diaphragmatic breathing and practicing self-pacing with activity.    Review Just started the program, has attended one session and developed more hip pain than normal and is being evaluated. He is still using smokeless tobacco and is not ready to quit yet.    Expected Outcomes For Marbin to improve his strength, stamina, and shortness of breath and move him closed to becoming tobacco free.           ITP Comments:   Comments: ITP REVIEW Pt is making expected progress toward pulmonary rehab goals after completing 1  sessions. Recommend continued exercise, life style modification, education, and utilization of breathing techniques to increase stamina and strength and decrease shortness of breath with exertion.

## 2020-10-04 ENCOUNTER — Telehealth: Payer: Self-pay

## 2020-10-04 ENCOUNTER — Encounter (HOSPITAL_COMMUNITY)
Admission: RE | Admit: 2020-10-04 | Discharge: 2020-10-04 | Disposition: A | Discharge status: Home / Self Care | Payer: BC Managed Care – PPO | Source: Ambulatory Visit | Attending: Cardiology | Admitting: Cardiology

## 2020-10-04 ENCOUNTER — Telehealth (HOSPITAL_COMMUNITY): Payer: Self-pay

## 2020-10-04 ENCOUNTER — Ambulatory Visit (INDEPENDENT_AMBULATORY_CARE_PROVIDER_SITE_OTHER): Payer: BC Managed Care – PPO | Admitting: Cardiovascular Disease

## 2020-10-04 DIAGNOSIS — Z954 Presence of other heart-valve replacement: Secondary | ICD-10-CM | POA: Diagnosis not present

## 2020-10-04 DIAGNOSIS — J984 Other disorders of lung: Secondary | ICD-10-CM

## 2020-10-04 LAB — POCT INR: INR: 3.4 — AB (ref 2.0–3.0)

## 2020-10-04 NOTE — Telephone Encounter (Signed)
Called and spoke w/pt stated that they were overdue inr check and the pt stated that they would try to complete today

## 2020-10-11 ENCOUNTER — Encounter (HOSPITAL_COMMUNITY): Payer: BC Managed Care – PPO

## 2020-10-13 ENCOUNTER — Encounter (HOSPITAL_COMMUNITY): Payer: BC Managed Care – PPO

## 2020-10-18 ENCOUNTER — Encounter (HOSPITAL_COMMUNITY): Payer: BC Managed Care – PPO

## 2020-10-20 ENCOUNTER — Encounter (HOSPITAL_COMMUNITY): Payer: BC Managed Care – PPO

## 2020-10-20 ENCOUNTER — Telehealth: Payer: Self-pay

## 2020-10-20 NOTE — Telephone Encounter (Signed)
lmom pt to do home inr test

## 2020-10-22 LAB — POCT INR: INR: 2.5 (ref 2.0–3.0)

## 2020-10-24 ENCOUNTER — Ambulatory Visit (INDEPENDENT_AMBULATORY_CARE_PROVIDER_SITE_OTHER): Payer: BC Managed Care – PPO | Admitting: Cardiology

## 2020-10-24 DIAGNOSIS — Z954 Presence of other heart-valve replacement: Secondary | ICD-10-CM | POA: Diagnosis not present

## 2020-10-24 DIAGNOSIS — Z5181 Encounter for therapeutic drug level monitoring: Secondary | ICD-10-CM | POA: Diagnosis not present

## 2020-10-25 ENCOUNTER — Encounter (HOSPITAL_COMMUNITY): Payer: BC Managed Care – PPO

## 2020-10-27 ENCOUNTER — Encounter (HOSPITAL_COMMUNITY): Payer: BC Managed Care – PPO

## 2020-10-28 MED ORDER — ENOXAPARIN SODIUM 100 MG/ML ~~LOC~~ SOLN: 100.0000 mg | Freq: Two times a day (BID) | SUBCUTANEOUS | 1 refills | Status: DC

## 2020-11-01 ENCOUNTER — Encounter (HOSPITAL_COMMUNITY): Payer: BC Managed Care – PPO

## 2020-11-02 ENCOUNTER — Ambulatory Visit (INDEPENDENT_AMBULATORY_CARE_PROVIDER_SITE_OTHER): Payer: BC Managed Care – PPO | Admitting: Cardiovascular Disease

## 2020-11-02 DIAGNOSIS — Z954 Presence of other heart-valve replacement: Secondary | ICD-10-CM

## 2020-11-02 LAB — POCT INR: INR: 2.4 (ref 2.0–3.0)

## 2020-11-03 ENCOUNTER — Encounter (HOSPITAL_COMMUNITY): Payer: BC Managed Care – PPO

## 2020-11-03 NOTE — Progress Notes (Signed)
Discharge Progress Report  Patient Details  Name: Darren Haynes MRN: [id number] Date of Birth: Dec 01, 1985 Referring Provider:   Doristine Devoid Pulmonary Rehab Walk Test from 09/19/2020 in [facility]  Referring Provider Dr. Mayford Knife       Number of Visits: 1  Reason for Discharge:  Early Exit:  Due to a possible hip infection and unable to tolerate exercise  Smoking History:  Social History   Tobacco Use  Smoking Status Never Smoker  Smokeless Tobacco Current User  . Types: Snuff    Diagnosis:  Restrictive lung disease  ADL UCSD:  Pulmonary Assessment Scores    Row Name 09/19/20 1015 09/19/20 1312       ADL UCSD   ADL Phase Entry --    SOB Score total 57 --         CAT Score   CAT Score 14 --         mMRC Score   mMRC Score -- 4           Initial Exercise Prescription:  Initial Exercise Prescription - 09/19/20 1300      Date of Initial Exercise RX and Referring Provider   Date 09/19/20    Referring Provider Dr. Mayford Knife    Expected Discharge Date 11/24/20      NuStep   Minutes 15      Track   Minutes 15      Prescription Details   Frequency (times per week) 2    Duration Progress to 30 minutes of continuous aerobic without signs/symptoms of physical distress      Intensity   THRR 40-80% of Max Heartrate 75-150    Ratings of Perceived Exertion 11-13    Perceived Dyspnea 0-4      Progression   Progression Continue to progress workloads to maintain intensity without signs/symptoms of physical distress.      Resistance Training   Training Prescription Yes    Weight Blue bands    Reps 10-15           Discharge Exercise Prescription (Final Exercise Prescription Changes):  Exercise Prescription Changes - 09/27/20 1100      Response to Exercise   Blood Pressure (Admit) 104/60    Blood Pressure (Exercise) 110/70    Blood Pressure (Exit) 112/70    Heart Rate (Admit) 82 bpm    Heart Rate (Exercise) 108 bpm     Heart Rate (Exit) 94 bpm    Oxygen Saturation (Admit) 98 %    Oxygen Saturation (Exercise) 97 %    Oxygen Saturation (Exit) 98 %    Rating of Perceived Exertion (Exercise) 15    Perceived Dyspnea (Exercise) 2    Symptoms Hip and back pain    Duration Continue with 30 min of aerobic exercise without signs/symptoms of physical distress.    Intensity Other (comment)   40%-80% of THR max     Progression   Progression Continue to progress workloads to maintain intensity without signs/symptoms of physical distress.      Resistance Training   Training Prescription Yes    Weight Blue bands    Reps 10-15    Time 10 Minutes      NuStep   Level 2    Minutes 15      Track   Laps 14    Minutes 15    METs 2.6           Functional Capacity:  6 Minute Walk  Row Name 09/19/20 1313         6 Minute Walk   Phase Initial     Distance 1624 feet     Walk Time 6 minutes     # of Rest Breaks 0     MPH 3.08     METS 5.82     RPE 15     Perceived Dyspnea  2     VO2 Peak 20.38     Symptoms Yes (comment)  Hip pain a 7 on 1-10 scale     Comments Hip and back pain 7/10     Resting HR 93 bpm     Resting BP 112/70     Resting Oxygen Saturation  99 %     Exercise Oxygen Saturation  during 6 min walk 99 %     Max Ex. HR 107 bpm     Max Ex. BP 144/82     2 Minute Post BP 126/78           Interval HR   1 Minute HR 106     2 Minute HR 107     3 Minute HR 106     4 Minute HR 104     5 Minute HR 105     6 Minute HR 107     2 Minute Post HR 89     Interval Heart Rate? Yes           Interval Oxygen   Interval Oxygen? Yes     Baseline Oxygen Saturation % 99 %     1 Minute Oxygen Saturation % 99 %     1 Minute Liters of Oxygen 0 L     2 Minute Oxygen Saturation % 99 %     2 Minute Liters of Oxygen 0 L     3 Minute Oxygen Saturation % 99 %     3 Minute Liters of Oxygen 0 L     4 Minute Oxygen Saturation % 99 %     4 Minute Liters of Oxygen 0 L     5 Minute Oxygen Saturation %  99 %     5 Minute Liters of Oxygen 0 L     6 Minute Oxygen Saturation % 99 %     6 Minute Liters of Oxygen 0 L     2 Minute Post Oxygen Saturation % 100 %     2 Minute Post Liters of Oxygen 0 L            Psychological, QOL, Others - Outcomes: PHQ 2/9: Depression screen [facility] 2/9 09/19/2020 07/31/2019 06/22/2019 06/18/2019  Decreased Interest 0 0 - 1  Down, Depressed, Hopeless 0 1 - 1  PHQ - 2 Score 0 1 - 2  Altered sleeping 0 - - 3  Tired, decreased energy 0 - - 3  Change in appetite 0 - - 0  Feeling bad or failure about yourself  0 - - 3  Trouble concentrating 0 - - 1  Moving slowly or fidgety/restless 0 - - 3  Suicidal thoughts 0 - - 0  PHQ-9 Score - - - 15  Difficult doing work/chores Not difficult at all - Somewhat difficult Somewhat difficult  Some recent data might be hidden    Quality of Life:   Personal Goals: Goals established at orientation with interventions provided to work toward goal.  Personal Goals and Risk Factors at Admission - 09/19/20 1033      Core Components/Risk Factors/Patient  Goals on Admission   Tobacco Cessation Yes    Number of packs per day Uses smokeless tobacco continuously all day.    Intervention Assist the participant in steps to quit. Provide individualized education and counseling about committing to Tobacco Cessation, relapse prevention, and pharmacological support that can be provided by physician.    Expected Outcomes Short Term: Will demonstrate readiness to quit, by selecting a quit date.    Improve shortness of breath with ADL's Yes    Intervention Provide education, individualized exercise plan and daily activity instruction to help decrease symptoms of SOB with activities of daily living.    Expected Outcomes Short Term: Improve cardiorespiratory fitness to achieve a reduction of symptoms when performing ADLs;Long Term: Be able to perform more ADLs without symptoms or delay the onset of symptoms            Personal Goals  Discharge:  Goals and Risk Factor Review    Row Name 09/19/20 1035 10/03/20 1025           Core Components/Risk Factors/Patient Goals Review   Personal Goals Review Tobacco Cessation;Improve shortness of breath with ADL's;Increase knowledge of respiratory medications and ability to use respiratory devices properly.;Develop more efficient breathing techniques such as purse lipped breathing and diaphragmatic breathing and practicing self-pacing with activity. Tobacco Cessation;Increase knowledge of respiratory medications and ability to use respiratory devices properly.;Improve shortness of breath with ADL's;Develop more efficient breathing techniques such as purse lipped breathing and diaphragmatic breathing and practicing self-pacing with activity.      Review -- Just started the program, has attended one session and developed more hip pain than normal and is being evaluated. He is still using smokeless tobacco and is not ready to quit yet.      Expected Outcomes -- For Rayne to improve his strength, stamina, and shortness of breath and move him closed to becoming tobacco free.             Exercise Goals and Review:  Exercise Goals    Row Name 09/19/20 1312             Exercise Goals   Increase Physical Activity Yes       Intervention Provide advice, education, support and counseling about physical activity/exercise needs.;Develop an individualized exercise prescription for aerobic and resistive training based on initial evaluation findings, risk stratification, comorbidities and participant's personal goals.       Expected Outcomes Short Term: Attend rehab on a regular basis to increase amount of physical activity.;Long Term: Add in home exercise to make exercise part of routine and to increase amount of physical activity.;Long Term: Exercising regularly at least 3-5 days a week.       Increase Strength and Stamina Yes       Intervention Provide advice, education, support and  counseling about physical activity/exercise needs.;Develop an individualized exercise prescription for aerobic and resistive training based on initial evaluation findings, risk stratification, comorbidities and participant's personal goals.       Expected Outcomes Short Term: Increase workloads from initial exercise prescription for resistance, speed, and METs.;Short Term: Perform resistance training exercises routinely during rehab and add in resistance training at home;Long Term: Improve cardiorespiratory fitness, muscular endurance and strength as measured by increased METs and functional capacity ( )       Able to understand and use rate of perceived exertion (RPE) scale Yes       Intervention Provide education and explanation on how to use RPE scale  Expected Outcomes Short Term: Able to use RPE daily in rehab to express subjective intensity level;Long Term:  Able to use RPE to guide intensity level when exercising independently       Able to understand and use Dyspnea scale Yes       Intervention Provide education and explanation on how to use Dyspnea scale       Expected Outcomes Short Term: Able to use Dyspnea scale daily in rehab to express subjective sense of shortness of breath during exertion;Long Term: Able to use Dyspnea scale to guide intensity level when exercising independently       Knowledge and understanding of Target Heart Rate Range (THRR) Yes       Intervention Provide education and explanation of THRR including how the numbers were predicted and where they are located for reference       Expected Outcomes Short Term: Able to state/look up THRR;Long Term: Able to use THRR to govern intensity when exercising independently;Short Term: Able to use daily as guideline for intensity in rehab       Understanding of Exercise Prescription Yes       Intervention Provide education, explanation, and written materials on patient's individual exercise prescription       Expected  Outcomes Short Term: Able to explain program exercise prescription;Long Term: Able to explain home exercise prescription to exercise independently              Exercise Goals Re-Evaluation:  Exercise Goals Re-Evaluation    Row Name 09/29/20 0846             Exercise Goal Re-Evaluation   Exercise Goals Review Increase Physical Activity;Increase Strength and Stamina;Able to understand and use rate of perceived exertion (RPE) scale;Able to understand and use Dyspnea scale;Knowledge and understanding of Target Heart Rate Range (THRR);Understanding of Exercise Prescription       Comments Pt has completed 1 exercise session and tolerated well with no complaints or concerns. He is exercising at 2.6 METS on the track. Will continue to monitor and progress as he is able.       Expected Outcomes Through exercise at rehab and home the patient will decrease shortness of breath with daily activities and feel confident in carrying out an exercise regimn at home.              Nutrition & Weight - Outcomes:  Pre Biometrics - 09/19/20 0955      Pre Biometrics   Height 6\' 2"  (1.88 m)    Weight 96.8 kg    BMI (Calculated) 27.39    Grip Strength 48 kg            Nutrition:   Nutrition Discharge:   Education Questionnaire Score:  Knowledge Questionnaire Score - 09/19/20 1332      Knowledge Questionnaire Score   Pre Score 16/18           Goals reviewed with patient; copy given to patient.

## 2020-11-03 NOTE — Addendum Note (Signed)
Encounter addended by: Drema Pry, RN on: 11/03/2020 9:37 AM  Actions taken: Clinical Note Signed, Episode resolved

## 2020-11-07 LAB — POCT INR: INR: 1.3 — AB (ref 2.0–3.0)

## 2020-11-08 ENCOUNTER — Encounter (HOSPITAL_COMMUNITY): Payer: BC Managed Care – PPO

## 2020-11-08 ENCOUNTER — Ambulatory Visit (INDEPENDENT_AMBULATORY_CARE_PROVIDER_SITE_OTHER): Payer: BC Managed Care – PPO | Admitting: Cardiology

## 2020-11-08 DIAGNOSIS — Z954 Presence of other heart-valve replacement: Secondary | ICD-10-CM | POA: Diagnosis not present

## 2020-11-08 MED ORDER — ENOXAPARIN SODIUM 100 MG/ML ~~LOC~~ SOLN: 100.0000 mg | Freq: Two times a day (BID) | SUBCUTANEOUS | 1 refills | Status: DC

## 2020-11-09 ENCOUNTER — Other Ambulatory Visit: Payer: Self-pay | Admitting: *Deleted

## 2020-11-09 MED ORDER — POTASSIUM CHLORIDE ER 10 MEQ PO TBCR: 10.0000 meq | EXTENDED_RELEASE_TABLET | Freq: Every day | ORAL | 3 refills | Status: AC

## 2020-11-10 ENCOUNTER — Encounter (HOSPITAL_COMMUNITY): Payer: BC Managed Care – PPO

## 2020-11-15 ENCOUNTER — Encounter (HOSPITAL_COMMUNITY): Payer: BC Managed Care – PPO

## 2020-11-17 ENCOUNTER — Encounter (HOSPITAL_COMMUNITY): Payer: BC Managed Care – PPO

## 2020-11-20 ENCOUNTER — Emergency Department (HOSPITAL_COMMUNITY)
Admission: EM | Admit: 2020-11-20 | Discharge: 2020-11-20 | Disposition: A | Discharge status: Home / Self Care | Payer: BC Managed Care – PPO | Attending: Emergency Medicine | Admitting: Emergency Medicine

## 2020-11-20 ENCOUNTER — Other Ambulatory Visit: Payer: Self-pay

## 2020-11-20 ENCOUNTER — Encounter (HOSPITAL_COMMUNITY): Payer: Self-pay | Admitting: Emergency Medicine

## 2020-11-20 DIAGNOSIS — Z5321 Procedure and treatment not carried out due to patient leaving prior to being seen by health care provider: Secondary | ICD-10-CM | POA: Diagnosis not present

## 2020-11-20 DIAGNOSIS — M791 Myalgia, unspecified site: Secondary | ICD-10-CM | POA: Diagnosis present

## 2020-11-20 NOTE — ED Triage Notes (Signed)
Pt stated c/o generalize weakness and body aches started yesterday. Pt hx of mitral valve replacement 2020, pt on warfarin.

## 2020-11-20 NOTE — ED Notes (Signed)
Upon triaging patient, We can't wait and we are going to [facility].

## 2020-11-22 ENCOUNTER — Ambulatory Visit (HOSPITAL_COMMUNITY): Payer: BC Managed Care – PPO

## 2020-11-24 ENCOUNTER — Ambulatory Visit (HOSPITAL_COMMUNITY): Payer: BC Managed Care – PPO

## 2020-11-30 ENCOUNTER — Telehealth: Payer: Self-pay

## 2020-11-30 NOTE — Telephone Encounter (Signed)
lmom for overdue inr 

## 2020-12-07 ENCOUNTER — Telehealth: Payer: Self-pay

## 2020-12-07 NOTE — Telephone Encounter (Signed)
Mailbox full unable to lmom for overdue inr

## 2020-12-09 ENCOUNTER — Other Ambulatory Visit: Payer: Self-pay | Admitting: Cardiovascular Disease

## 2020-12-15 LAB — POCT INR: INR: 5.4 — AB (ref 2.0–3.0)

## 2020-12-19 ENCOUNTER — Ambulatory Visit (INDEPENDENT_AMBULATORY_CARE_PROVIDER_SITE_OTHER): Payer: BC Managed Care – PPO | Admitting: Cardiology

## 2020-12-19 DIAGNOSIS — Z954 Presence of other heart-valve replacement: Secondary | ICD-10-CM

## 2020-12-19 DIAGNOSIS — I050 Rheumatic mitral stenosis: Secondary | ICD-10-CM

## 2020-12-23 LAB — POCT INR: INR: 3.2 — AB (ref 2.0–3.0)

## 2020-12-27 ENCOUNTER — Ambulatory Visit (INDEPENDENT_AMBULATORY_CARE_PROVIDER_SITE_OTHER): Payer: BC Managed Care – PPO | Admitting: Cardiology

## 2020-12-27 DIAGNOSIS — I050 Rheumatic mitral stenosis: Secondary | ICD-10-CM

## 2020-12-27 DIAGNOSIS — Z954 Presence of other heart-valve replacement: Secondary | ICD-10-CM

## 2020-12-29 ENCOUNTER — Other Ambulatory Visit: Payer: Self-pay | Admitting: Cardiovascular Disease

## 2021-01-03 ENCOUNTER — Telehealth: Payer: Self-pay

## 2021-01-03 ENCOUNTER — Telehealth: Payer: Self-pay | Admitting: Cardiovascular Disease

## 2021-01-03 NOTE — Telephone Encounter (Signed)
Cheryl with [employer] called to make Darren Haynes aware that she has attempted to contact the patient several times with no success. I confirmed she had the same number that we do for the patient. She states if we are able to get in touch with the patient he can call [phone] for assistance with the application.

## 2021-01-03 NOTE — Telephone Encounter (Signed)
lmom pt to contact [employer] at [phone]

## 2021-01-06 ENCOUNTER — Ambulatory Visit (INDEPENDENT_AMBULATORY_CARE_PROVIDER_SITE_OTHER): Payer: BC Managed Care – PPO | Admitting: Cardiology

## 2021-01-06 DIAGNOSIS — Z954 Presence of other heart-valve replacement: Secondary | ICD-10-CM

## 2021-01-06 DIAGNOSIS — Z5181 Encounter for therapeutic drug level monitoring: Secondary | ICD-10-CM

## 2021-01-06 LAB — POCT INR: INR: 3.7 — AB (ref 2.0–3.0)

## 2021-01-19 ENCOUNTER — Other Ambulatory Visit: Payer: Self-pay

## 2021-01-19 ENCOUNTER — Ambulatory Visit (HOSPITAL_COMMUNITY)
Admission: RE | Admit: 2021-01-19 | Discharge: 2021-01-19 | Disposition: A | Discharge status: Home / Self Care | Payer: BC Managed Care – PPO | Source: Ambulatory Visit | Attending: Cardiovascular Disease | Admitting: Cardiovascular Disease

## 2021-01-19 DIAGNOSIS — Z954 Presence of other heart-valve replacement: Secondary | ICD-10-CM

## 2021-01-19 LAB — ECHOCARDIOGRAM COMPLETE
Area-P 1/2: 2.42 cm2
MV VTI: 1.41 cm2
P 1/2 time: 330 msec
S' Lateral: 3.2 cm

## 2021-01-19 NOTE — Progress Notes (Signed)
*  PRELIMINARY RESULTS* Echocardiogram 2D Echocardiogram has been performed.  Stacey Drain 01/19/2021, 12:33 PM

## 2021-01-23 ENCOUNTER — Other Ambulatory Visit: Payer: Self-pay

## 2021-01-23 DIAGNOSIS — I471 Supraventricular tachycardia: Secondary | ICD-10-CM

## 2021-01-26 ENCOUNTER — Ambulatory Visit (INDEPENDENT_AMBULATORY_CARE_PROVIDER_SITE_OTHER): Payer: BC Managed Care – PPO | Admitting: Physician Assistant

## 2021-01-26 ENCOUNTER — Encounter: Payer: Self-pay | Admitting: Physician Assistant

## 2021-01-26 ENCOUNTER — Other Ambulatory Visit: Payer: Self-pay

## 2021-01-26 ENCOUNTER — Ambulatory Visit (INDEPENDENT_AMBULATORY_CARE_PROVIDER_SITE_OTHER): Payer: BC Managed Care – PPO | Admitting: Pharmacist

## 2021-01-26 VITALS — BP 122/78 | HR 79 | Ht 74.0 in | Wt 224.2 lb

## 2021-01-26 DIAGNOSIS — I050 Rheumatic mitral stenosis: Secondary | ICD-10-CM

## 2021-01-26 DIAGNOSIS — Z954 Presence of other heart-valve replacement: Secondary | ICD-10-CM

## 2021-01-26 DIAGNOSIS — I5032 Chronic diastolic (congestive) heart failure: Secondary | ICD-10-CM | POA: Diagnosis not present

## 2021-01-26 DIAGNOSIS — I480 Paroxysmal atrial fibrillation: Secondary | ICD-10-CM | POA: Diagnosis not present

## 2021-01-26 DIAGNOSIS — Z7901 Long term (current) use of anticoagulants: Secondary | ICD-10-CM

## 2021-01-26 DIAGNOSIS — R0600 Dyspnea, unspecified: Secondary | ICD-10-CM

## 2021-01-26 DIAGNOSIS — I471 Supraventricular tachycardia: Secondary | ICD-10-CM

## 2021-01-26 LAB — POCT INR: INR: 4.1 — AB (ref 2.0–3.0)

## 2021-01-26 MED ORDER — METOPROLOL TARTRATE 25 MG PO TABS: 12.5000 mg | ORAL_TABLET | Freq: Two times a day (BID) | ORAL | 3 refills | Status: DC

## 2021-01-26 NOTE — Patient Instructions (Addendum)
Medication Instructions:   DECREASE Metoprolol Tartrate (Lopressor) to 12.5 mg 2 times a day   *If you need a refill on your cardiac medications before your next appointment, please call your pharmacy*  Lab Work: NONE ordered at this time of appointment   If you have labs (blood work) drawn today and your tests are completely normal, you will receive your results only by: [name] Kitchen MyChart Message (if you have MyChart) OR . A paper copy in the mail If you have any lab test that is abnormal or we need to change your treatment, we will call you to review the results.  Testing/Procedures: NONE ordered at this time of appointment    Follow-Up: At [facility], you and your health needs are our priority.  As part of our continuing mission to provide you with exceptional heart care, we have created designated Provider Care Teams.  These Care Teams include your primary Cardiologist (physician) and Advanced Practice Providers (APPs -  Physician Assistants and Nurse Practitioners) who all work together to provide you with the care you need, when you need it.   Your next appointment:   3-4 month(s)  The format for your next appointment:   In Person  Provider:   Nanetta Batty, MD  Other Instructions

## 2021-01-26 NOTE — Patient Instructions (Signed)
Description   Continue with 5 mg daily except 2.5 mg each Monday.  Repeat INR in 2 weeks  Pt cannot connect  [employer] app - will reach out to company to contact patient.

## 2021-01-26 NOTE — Progress Notes (Signed)
Cardiology Office Note:    Date:  01/28/2021   ID:  Darren Haynes, DOB 02-Aug-1986, MRN [id number]  PCP:  Center, [facility]   [facility]  Cardiologist:  Nanetta Batty, MD  Advanced Practice Provider:  Azalee Course, PA Electrophysiologist:  None   Referring MD: Center, [facility]   Chief Complaint  Patient presents with  . Follow-up    Seen for Dr. Allyson Sabal    History of Present Illness:    Darren Haynes is a 35 y.o. 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 He was first seen by Dr. Allyson Sabal in January 2020, echocardiogram performed at the time demonstrated normal LV function, peak mitral gradient of 47 mmHg, with mean gradient of 29 mmHg. Mitral valve area 1.5 cm. He had severe dyspnea and lower extremity edema. TEE demonstrated classic rheumatic mitral valve stenosis with mean gradient of 15 mmHg. Left and right heart cath revealed significant mitral valve gradient, normal coronaries. He was referred to Dr. Modesto Charon at [facility] who performed a successful mitral balloon valvuloplasty in February 2020 with immediate improvement in his pulmonary artery pressure and symptom for the next 2 months. However he returned in April 2020 with progressive dyspnea and lower extremity edema. TEE obtained on 03/27/2019 demonstrated moderate to severe MS. Echocardiogram revealed mild LV dysfunction with EF of 45 to 50%. Event monitor showed PACs, PVCs and episode of PSVT and NSVT. Due to progressive worsening symptom of mitral stenosis, he eventually underwent mitral valve replacement with aSorin CarboMedicsbileaflet mechanical valve 29 mm performed on 05/14/2019.Postop course complicated by volume overload and postop A. fib which he converted to sinus rhythm on IV amiodarone therapy.On postop day #2, he had an episode of  complete heart block. Lopressor PRN was discontinued. He was discharged on 30-day event monitor. Amiodarone therapy was also discontinued as well. Echocardiogram obtained on 06/23/2019 revealed a normal LV function, no evidence of pericardial effusion with well functioning mechanical prosthesis. He was started on low-dose beta-blocker due to tachycardia.   He was seen by neurology at [facility] in August for diplopia, based on his clinical symptom, image finding and CSF result, it is very unlikely this patient has multiple sclerosis. 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 He was deemed not a candidate for free vascularized fibular graft.He eventually underwentleft total hip replacement at [facility]. Postop course complicated by bacteremia requiring placement of PICC line and IV antibiotic.   Since last office visit, he went to [facility] ED on 11/29/2019 for chest pain. Chest CT was negative for PE but demonstrated right upper lobe groundglass opacity with patchy nodular opacities in the left lower lobe, suspicious for multifocal infection.Based on the ED physicians report, they do not suspect acute pulmonary infection or any infection stemming from his PICC line. Right upper extremity ultrasound demonstrated superficial thrombosis of the right basilic vein at the site of PICC line.  I last saw the patient in February 2021 at which time he complained of worsening dyspnea.  We decided to move his repeat echocardiogram to a earlier date.  He eventually had repeat echocardiogram on 01/14/2020 which demonstrated EF 55 to 60%, well-functioning mechanical mitral valve with no evidence of regurgitation or stenosis.  He was last seen by Dr. Allyson Sabal in  August 2021 at which time he was doing well.  INR has been difficult to control.  Repeat echocardiogram scheduled for March 2022.  Unfortunately he had to undergo revision of hip surgery due to infection in September 2021.  He was last seen by Dr. Darin Engels of [facility] cardiology service in October 2021 at which time he complained of dyspnea with minimal exertion.  A heart monitor and a cardiopulmonary stress test was ordered.  Her monitor showed sinus rhythm with less than 1% ventricular ectopy including PACs and PVCs.  Cardiopulmonary stress test obtained in November 2021 at [facility] showed moderate to severe functional impairment, circulatory limitation to exercise capacity as evident by blunted heart rate and stroke-volume augmentation with exercise, some degree of physical deconditioning as evident by low aerobic reserve, no significant decline in FEV1 after exercise.  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  Repeat echocardiogram obtained on 12/07/2020 showed EF 50% mild RV systolic dysfunction, mild AI, no obvious sign of mitral valve vegetation.  TEE obtained on 12/12/2020 also does not show any vegetation.  Echocardiogram was repeated on 01/19/2021 that showed EF 55 to 60%, stable mechanical mitral valve without significant change in gradient, mild AI.  Patient presents today for evaluation of dyspnea on minimal exertion.  He denies any chest pain.  He appears to be euvolemic on physical exam.  Based on the previous cardiopulmonary stress test, I wonder if his shortness of breath is related to chronotropic incompetence.  I reviewed the record with DOD Dr. Rennis Golden.  We recommend to reduce metoprolol tartrate to 12.5 mg twice daily dosing.  Otherwise, he does not have any orthopnea or PND. Recent hemoglobin obtained on 01/23/2021 showed hemoglobin of 13.6.  Renal function appears to be stable, electrolyte normal.  There does not seems to be other secondary causes to explain his shortness of breath with exertion.  Past Medical History:  Diagnosis Date  . Arthritis    . Avascular necrosis (HCC)   . CHF (congestive heart failure) (HCC)   . Chronic back pain   . Depression   . Dyspnea   . GERD (gastroesophageal reflux disease)   . Heart murmur   . Mitral valve stenosis   . Panic attacks   . Pneumonia    hx 4 time  . Pulmonary hypertension (HCC)   . Rheumatic fever   . 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 BALLOON VALVULOPLASTY  12/24/2018   balloon mitral valvuloplasty at [facility]  . JOINT REPLACEMENT    . MITRAL VALVE REPLACEMENT Right 05/14/2019   Procedure: MINIMALLY INVASIVE MITRAL VALVE (MV) REPLACEMENT USING CARBOMEDICS OPTIFORM Size 82mm;  Surgeon: Purcell Nails, MD;  Location: [facility] OR;  Service: Open Heart Surgery;  Laterality: Right;  . RIGHT/LEFT HEART CATH AND CORONARY ANGIOGRAPHY N/A 12/11/2018   Procedure: RIGHT/LEFT HEART CATH AND CORONARY ANGIOGRAPHY;  Surgeon: Runell Gess, MD;  Location: [facility] INVASIVE CV LAB;  Service: Cardiovascular;  Laterality: N/A;  . TEE WITHOUT CARDIOVERSION N/A 12/11/2018   Procedure: TRANSESOPHAGEAL ECHOCARDIOGRAM (TEE);  Surgeon: Thurmon Fair, MD;  Location: [facility] ENDOSCOPY;  Service: Cardiovascular;  Laterality: N/A;  . TEE WITHOUT CARDIOVERSION N/A 03/27/2019   Procedure: TRANSESOPHAGEAL ECHOCARDIOGRAM (TEE);  Surgeon: Elease Hashimoto Deloris Ping, MD;  Location: [facility] ENDOSCOPY;  Service: Cardiovascular;  Laterality: N/A;  . TEE WITHOUT CARDIOVERSION N/A 05/14/2019   Procedure: TRANSESOPHAGEAL ECHOCARDIOGRAM (TEE);  Surgeon: Purcell Nails, MD;  Location: [facility] OR;  Service: Open Heart Surgery;  Laterality: N/A;  . TYMPANOSTOMY TUBE PLACEMENT      Current Medications: Current Meds  Medication Sig  . ADVAIR HFA 115-21 MCG/ACT inhaler Inhale 1 puff into the lungs 2 (two) times daily.  [name] Kitchen albuterol (VENTOLIN HFA) 108 (90 Base) MCG/ACT  inhaler Inhale 1-2 puffs into the lungs 4 (four) times daily as needed for wheezing or shortness of breath.  . Apremilast (OTEZLA) 30 MG TABS Take 30 mg by mouth in the morning and at bedtime.  [name] Kitchen aspirin EC 81 MG tablet Take 81 mg by mouth daily.  . Buprenorphine HCl 900 MCG FILM Place 2 Film inside cheek daily.   . busPIRone (BUSPAR) 15 MG tablet Take 15 mg by mouth 3 (three) times daily.  . celecoxib (CELEBREX) 200 MG capsule Take 200 mg by mouth daily.   . Cholecalciferol (VITAMIN D-1000 MAX ST) 25 MCG (1000 UT) tablet Take 1,000 Units by mouth once a week.   . clonazePAM (KLONOPIN) 0.5 MG tablet Take 0.5 mg by mouth at bedtime.  [name] Kitchen Desvenlafaxine Succinate ER 25 MG TB24 Take 25 mg by mouth 2 (two) times daily.  . diclofenac Sodium (VOLTAREN) 1 % GEL SMARTSIG:2 Gram(s) Topical 3 Times Daily PRN  . folic acid (FOLVITE) 1 MG tablet Take by mouth.   . furosemide (LASIX) 40 MG tablet Take 1 tablet (40 mg total) by mouth daily. You may take an extra  as needed for volume overload.  . gabapentin (NEURONTIN) 800 MG tablet Take 800 mg by mouth 3 (three) times daily.  . hydrOXYzine (ATARAX/VISTARIL) 25 MG tablet Take 25 mg by mouth as directed.  [name] Kitchen levocetirizine (XYZAL) 5 MG tablet Take 5 mg by mouth in the morning and at bedtime.  [name] Kitchen omeprazole (PRILOSEC) 40 MG capsule Take 40 mg by mouth daily.  . ondansetron (ZOFRAN) 4 MG tablet TAKE 1 TABLET BY MOUTH THREE TIMES DAILY  . Oxycodone HCl 10 MG TABS Take 15 mg by mouth every 4 (four) hours as needed (pain).   . potassium chloride (KLOR-CON) 10 MEQ tablet Take 1 tablet (10 mEq total) by mouth daily.  . pravastatin (PRAVACHOL) 20 MG tablet Take 20 mg by mouth daily.  . risperiDONE (RISPERDAL) 0.25 MG tablet Take 0.25 mg by mouth at bedtime.  . sulfaSALAzine (AZULFIDINE) 500 MG EC tablet Take 1,000 mg by mouth 2 (two) times daily.  [name] Kitchen tiZANidine (ZANAFLEX) 4 MG tablet Take 4 mg by mouth 3 (three) times daily.  [name] Kitchen warfarin (COUMADIN) 5 MG tablet TAKE 1  TO 1.5 TABLETS BY MOUTH DAILY AS DIRECTED BY COUMADIN CLINIC  . XTAMPZA ER 36 MG C12A Take 1 capsule by mouth 2 (two) times daily.  . [DISCONTINUED] metoprolol tartrate (LOPRESSOR) 25 MG tablet Take 25 mg by mouth 2 (two) times daily.     Allergies:   Patient has no known allergies.   Social History   Socioeconomic History  . Marital status: Married    Spouse name: Not on file  . Number of children: Not on file  . Years of education: Not on file  . Highest education level: Not on file  Occupational History  . Not on file  Tobacco Use  . Smoking status: Never Smoker  . Smokeless tobacco: Current User    Types: Snuff  Vaping Use  . Vaping Use: Never used  Substance and Sexual Activity  . Alcohol use: No  . Drug use: No  . Sexual activity: Not on file  Other Topics Concern  . Not on file  Social History Narrative  . Not on file   Social Determinants of Health   Financial Resource Strain: Not on file  Food Insecurity: Not on file  Transportation Needs: Not on file  Physical Activity: Not on file  Stress: Not on file  Social Connections: Not on file     Family History: The patient's family history includes Depression in his mother; Heart disease in his mother; Hyperlipidemia in his father; Hypertension in his father.  ROS:   Please see the history of present illness.     All other systems reviewed and are negative.  EKGs/Labs/Other Studies Reviewed:    The following studies were reviewed today:  Echo 01/19/2021 1. Left ventricular ejection fraction, by estimation, is 55 to 60%. The  left ventricle has normal function. The left ventricle has no regional  wall motion abnormalities. Left ventricular diastolic parameters are  indeterminate.  2. Right ventricular systolic function is normal. The right ventricular  size is normal. Tricuspid regurgitation signal is inadequate for assessing  PA pressure.  3. Left atrial size was moderately dilated.  4. The mitral  valve has been repaired/replaced. Trivial mitral valve  regurgitation. The mean mitral valve gradient is 4.0 mmHg. There is a 29  mm Sorin Carbometrics mechanical valve present in the mitral position.  Grossly normal function with no  significant change in gradient compared with study 2021.  5. The aortic valve is tricuspid. Aortic valve regurgitation is mild.  Aortic regurgitation PHT measures 330 msec.  6. The inferior vena cava is normal in size with greater than 50%  respiratory variability, suggesting right atrial pressure of 3 mmHg.   EKG:  EKG is ordered today.  The ekg ordered today demonstrates normal sinus rhythm, no significant ST-T wave changes.  Recent Labs: 09/02/2020: ALT 7; BUN 6; Creatinine, Ser 0.91; Hemoglobin 10.8; Platelets 227; Potassium 3.7; Sodium 139  Recent Lipid Panel No results found for: CHOL, TRIG, HDL, CHOLHDL, VLDL, LDLCALC, LDLDIRECT   Risk Assessment/Calculations:    CHA2DS2-VASc Score = 2  This indicates a 2.2% annual risk of stroke. The patient's score is based upon: CHF History: Yes HTN History: Yes Diabetes History: No Stroke History: No Vascular Disease History: No Age Score: 0 Gender Score: 0      Physical Exam:    VS:  BP 122/78   Pulse 79   Ht 6\' 2"  (1.88 m)   Wt 224 lb 3.2 oz (101.7 kg)   BMI 28.79 kg/m     Wt Readings from Last 3 Encounters:  01/26/21 224 lb 3.2 oz (101.7 kg)  09/27/20 214 lb 11.7 oz (97.4 kg)  09/19/20 213 lb 6.5 oz (96.8 kg)     GEN:  Well nourished, well developed in no acute distress HEENT: Normal NECK: No JVD; No carotid bruits LYMPHATICS: No lymphadenopathy CARDIAC: RRR, no murmurs, rubs, gallops RESPIRATORY:  Clear to auscultation without rales, wheezing or rhonchi  ABDOMEN: Soft, non-tender, non-distended MUSCULOSKELETAL:  No edema; No deformity  SKIN: Warm and dry NEUROLOGIC:  Alert and oriented x 3 PSYCHIATRIC:  Normal affect   ASSESSMENT:    1. Dyspnea, unspecified type   2.  Chronic diastolic heart failure (HCC)   3. PAF (paroxysmal atrial fibrillation) (HCC)   4. S/P mitral valve replacement with metallic valve    PLAN:  In order of problems listed above:  1. Dyspnea: Patient appears to be euvolemic on physical exam.  Recent CBC and basic metabolic panel were normal.  Some of his shortness of breath may be due to deconditioning.  I reviewed his previous records with DOD Dr. Rennis Golden, I question if the patient has some degree of chronotropic incompetence contributing to his dyspnea, we decided to reduce his metoprolol to 12.5 mg twice daily dosing  2. Chronic diastolic heart failure: Euvolemic on physical exam  3. PAF: Postop atrial fibrillation, no recurrence  4. History of mechanical mitral valve replacement: He has frequent supratherapeutic levels.        Medication Adjustments/Labs and Tests Ordered: Current medicines are reviewed at length with the patient today.  Concerns regarding medicines are outlined above.  Orders Placed This Encounter  Procedures  . EKG 12-Lead   Meds ordered this encounter  Medications  . metoprolol tartrate (LOPRESSOR) 25 MG tablet    Sig: Take 0.5 tablets (12.5 mg total) by mouth 2 (two) times daily.    Dispense:  90 tablet    Refill:  3    Patient Instructions  Medication Instructions:   DECREASE Metoprolol Tartrate (Lopressor) to 12.5 mg 2 times a day   *If you need a refill on your cardiac medications before your next appointment, please call your pharmacy*  Lab Work: NONE ordered at this time of appointment   If you have labs (blood work) drawn today and your tests are completely normal, you will receive your results only by: [name] Kitchen MyChart Message (if you have MyChart) OR . A paper copy in the mail If you have any lab test that is abnormal or we need to change your treatment, we will call you to review the results.  Testing/Procedures: NONE ordered at this time of appointment    Follow-Up: At [facility], you and your health needs are our priority.  As part of our continuing mission to provide you with exceptional heart care, we have created designated Provider Care Teams.  These Care Teams include your primary Cardiologist (physician) and Advanced Practice Providers (APPs -  Physician Assistants and Nurse Practitioners) who all work together to provide you with the care you need, when you need it.   Your next appointment:   3-4 month(s)  The format for your next appointment:   In Person  Provider:   Nanetta Batty, MD  Other Instructions      Signed, Azalee Course, PA  01/28/2021 10:53 PM    [facility]

## 2021-01-28 ENCOUNTER — Encounter: Payer: Self-pay | Admitting: Physician Assistant

## 2021-02-12 LAB — POCT INR: INR: 4.2 — AB (ref 2.0–3.0)

## 2021-02-14 ENCOUNTER — Ambulatory Visit (INDEPENDENT_AMBULATORY_CARE_PROVIDER_SITE_OTHER): Payer: BC Managed Care – PPO | Admitting: Cardiovascular Disease

## 2021-02-14 DIAGNOSIS — Z954 Presence of other heart-valve replacement: Secondary | ICD-10-CM

## 2021-03-01 ENCOUNTER — Telehealth: Payer: Self-pay

## 2021-03-01 NOTE — Telephone Encounter (Signed)
lmom for Warfarin overdue self tester 

## 2021-03-06 ENCOUNTER — Ambulatory Visit (INDEPENDENT_AMBULATORY_CARE_PROVIDER_SITE_OTHER): Payer: BC Managed Care – PPO | Admitting: Cardiology

## 2021-03-06 DIAGNOSIS — Z954 Presence of other heart-valve replacement: Secondary | ICD-10-CM | POA: Diagnosis not present

## 2021-03-06 LAB — POCT INR: INR: 3.5 — AB (ref 2.0–3.0)

## 2021-03-22 ENCOUNTER — Telehealth: Payer: Self-pay

## 2021-03-22 ENCOUNTER — Ambulatory Visit (INDEPENDENT_AMBULATORY_CARE_PROVIDER_SITE_OTHER): Payer: BC Managed Care – PPO | Admitting: Pharmacist

## 2021-03-22 DIAGNOSIS — I050 Rheumatic mitral stenosis: Secondary | ICD-10-CM | POA: Diagnosis not present

## 2021-03-22 DIAGNOSIS — Z954 Presence of other heart-valve replacement: Secondary | ICD-10-CM | POA: Diagnosis not present

## 2021-03-22 DIAGNOSIS — Z7901 Long term (current) use of anticoagulants: Secondary | ICD-10-CM

## 2021-03-22 LAB — POCT INR: INR: 1.8 — AB (ref 2.0–3.0)

## 2021-03-22 NOTE — Patient Instructions (Signed)
Description   Take 2 tablets (10mg ) today and tomorrow and then continue taking 5 mg daily except 2.5 mg each Tuesday.  Repeat INR in 1 week

## 2021-03-22 NOTE — Telephone Encounter (Signed)
Called and lmomed pt's wife for Warfarin overdue inr Forensic psychologist

## 2021-04-04 ENCOUNTER — Telehealth: Payer: Self-pay

## 2021-04-04 ENCOUNTER — Ambulatory Visit (INDEPENDENT_AMBULATORY_CARE_PROVIDER_SITE_OTHER): Payer: BC Managed Care – PPO | Admitting: Cardiovascular Disease

## 2021-04-04 DIAGNOSIS — Z954 Presence of other heart-valve replacement: Secondary | ICD-10-CM

## 2021-04-04 LAB — POCT INR: INR: 3 (ref 2.0–3.0)

## 2021-04-04 NOTE — Telephone Encounter (Signed)
lmom for warfarin self tester

## 2021-04-19 ENCOUNTER — Telehealth: Payer: Self-pay | Admitting: Pharmacist

## 2021-04-19 ENCOUNTER — Ambulatory Visit (INDEPENDENT_AMBULATORY_CARE_PROVIDER_SITE_OTHER): Payer: BC Managed Care – PPO | Admitting: Cardiovascular Disease

## 2021-04-19 DIAGNOSIS — Z7901 Long term (current) use of anticoagulants: Secondary | ICD-10-CM

## 2021-04-19 DIAGNOSIS — Z5181 Encounter for therapeutic drug level monitoring: Secondary | ICD-10-CM

## 2021-04-19 DIAGNOSIS — Z954 Presence of other heart-valve replacement: Secondary | ICD-10-CM

## 2021-04-19 LAB — POCT INR: INR: 7.8 — AB (ref 2.0–3.0)

## 2021-04-19 MED ORDER — AZITHROMYCIN 250 MG PO TABS: 500.0000 mg | ORAL_TABLET | Freq: Once | ORAL | 0 refills | Status: AC

## 2021-04-19 NOTE — Telephone Encounter (Signed)
Patient with hx of mitral valve replacement. Needs Dental prophylaxis for extraction d/t abscess. He is currently on cephalexin 500mg  three times daily.  Per guidelines "Patients on concurrent antibiotics -- If patients are receiving antibiotics for other indications at the time that dental or invasive procedures are undertaken, an alternate antibiotic of a different class is often chosen. As an example, if a patient receiving penicillin for rheumatic fever prophylaxis undergoes an invasive dental procedure and requires IE prophylaxis, clindamycin, cephalexin, or azithromycin is often chosen."

## 2021-04-24 ENCOUNTER — Ambulatory Visit (INDEPENDENT_AMBULATORY_CARE_PROVIDER_SITE_OTHER): Payer: BC Managed Care – PPO | Admitting: Pharmacist

## 2021-04-24 ENCOUNTER — Telehealth: Payer: Self-pay

## 2021-04-24 DIAGNOSIS — Z5181 Encounter for therapeutic drug level monitoring: Secondary | ICD-10-CM

## 2021-04-24 DIAGNOSIS — Z954 Presence of other heart-valve replacement: Secondary | ICD-10-CM | POA: Diagnosis not present

## 2021-04-24 LAB — POCT INR: INR: 2.2 (ref 2.0–3.0)

## 2021-04-24 NOTE — Telephone Encounter (Signed)
Please see other telephone note as this one was created as a duplicate error

## 2021-04-24 NOTE — Telephone Encounter (Signed)
Mr. Bracewell is scheduled to have a procedure today this afternoon. (They didn't specify what procedure). Toniann Fail from providers office needs a call back to discuss medication. Will route to pharmd pool for advisement

## 2021-04-24 NOTE — Telephone Encounter (Signed)
Dental office calling to verify okay to proceed with 1 tooth extraction this afternoon.  Recommendations:  INR 2.2 today, okay to proceed with extraction and no need to stop anticoagulation.  Azithromycin 500mg  x1 dose 30-60 minutes before procedure given.   Dental office will fax request today to provide recommendations in witting.

## 2021-04-24 NOTE — Telephone Encounter (Signed)
Talked to dental office. Patient HELD warfarin for 3 days for INR to come down from 7.8 to 2.2 (today).   *PATIENT NOT ON ASA EITHER*  Assistance will discuss with provider and determine if they are comfortable with extraction today or will refer to oral surgeon.  Per ADA guideline okay to perform extraction with INR of 2.2, but dentist will make decision and call patient back as soon as possible.

## 2021-04-24 NOTE — Telephone Encounter (Signed)
Pt calling regarding todays procedure and stated that Junius took him off warfarin for 3 days but raquel stated that he wasn't supposed to come off of it and the dentist is telling pt that they aren't even sure if they will be able to do the procedure today. Will route to raquel and Laurance for further assessment

## 2021-04-26 ENCOUNTER — Ambulatory Visit (INDEPENDENT_AMBULATORY_CARE_PROVIDER_SITE_OTHER): Payer: BC Managed Care – PPO | Admitting: Cardiovascular Disease

## 2021-04-26 DIAGNOSIS — Z954 Presence of other heart-valve replacement: Secondary | ICD-10-CM | POA: Diagnosis not present

## 2021-04-26 DIAGNOSIS — Z7901 Long term (current) use of anticoagulants: Secondary | ICD-10-CM

## 2021-04-26 DIAGNOSIS — Z5181 Encounter for therapeutic drug level monitoring: Secondary | ICD-10-CM | POA: Diagnosis not present

## 2021-04-26 LAB — POCT INR: INR: 3 (ref 2.0–3.0)

## 2021-04-26 NOTE — Patient Instructions (Signed)
04/26/21: INR checked and recorded.  Tooth extraction cancelled on 6/14, referred to Oral Surgeon, TBD.  Continue 1 tablet Daily, except 1/2 tablet on Tuesday.

## 2021-05-03 ENCOUNTER — Ambulatory Visit: Payer: BC Managed Care – PPO | Admitting: Cardiovascular Disease

## 2021-05-09 ENCOUNTER — Other Ambulatory Visit: Payer: Self-pay

## 2021-05-09 ENCOUNTER — Encounter: Payer: Self-pay | Admitting: Cardiovascular Disease

## 2021-05-09 ENCOUNTER — Ambulatory Visit (INDEPENDENT_AMBULATORY_CARE_PROVIDER_SITE_OTHER): Payer: BC Managed Care – PPO | Admitting: Cardiovascular Disease

## 2021-05-09 VITALS — BP 122/84 | HR 71 | Ht >= 80 in | Wt 214.9 lb

## 2021-05-09 DIAGNOSIS — Z954 Presence of other heart-valve replacement: Secondary | ICD-10-CM

## 2021-05-09 NOTE — Patient Instructions (Signed)
Medication Instructions:  Your Physician recommend you continue on your current medication as directed.    *If you need a refill on your cardiac medications before your next appointment, please call your pharmacy*   Lab Work: None ordered today   Testing/Procedures: Your physician has requested that you have an echocardiogram in March 2023. Echocardiography is a painless test that uses sound waves to create images of your heart. It provides your doctor with information about the size and shape of your heart and how well your heart's chambers and valves are working. This procedure takes approximately one hour. There are no restrictions for this procedure. [address]    Follow-Up: At [facility], you and your health needs are our priority.  As part of our continuing mission to provide you with exceptional heart care, we have created designated Provider Care Teams.  These Care Teams include your primary Cardiologist (physician) and Advanced Practice Providers (APPs -  Physician Assistants and Nurse Practitioners) who all work together to provide you with the care you need, when you need it.  We recommend signing up for the patient portal called "MyChart".  Sign up information is provided on this After Visit Summary.  MyChart is used to connect with patients for Virtual Visits (Telemedicine).  Patients are able to view lab/test results, encounter notes, upcoming appointments, etc.  Non-urgent messages can be sent to your provider as well.   To learn more about what you can do with MyChart, go to [email or web address].    Your next appointment:   1 year(s)  The format for your next appointment:   In Person  Provider:   Nanetta Batty, MD

## 2021-05-09 NOTE — Progress Notes (Signed)
05/09/2021 Darren Haynes   Mar 20, 1986  [id number]  [facility], [facility] Primary Cardiologist: Runell Gess MD FACP, [place], [place], [place]  HPI:  Darren Haynes is a 35 y.o.   thin  appearing married Caucasian male father 2 children who did work in Aeronautical engineer but has not worked in the last 9 months because of progressive dyspnea on exertion.  He was referred by Dr. Hanley Hays for evaluation of symptomatic mitral stenosis.  I last saw him in the office  06/14/2020.  He is currently not working.  There is no history of rheumatic fever.  He has no other cardiac risk factors.  He is had progressive dyspnea for the last 9 months with some nonspecific aches and pains.  Is being worked up by rheumatologist.  Echo performed 11/21/2018 revealed normal LV systolic function, a peak mitral gradient of 47 mmHg with a mean of 29 mmHg and mitral valve area 1.5 cm with a PA pressure of 69 mmHg.  He does have severe dyspnea and lower extreme edema.  He is on oral diuretic.  He presented today for transesophageal echocardiogram performed Dr. Royann Shivers which showed classic rheumatic mitral stenosis with a mean gradient of 15 mmHg.   I did right left heart cath revealing a significant mitral valve gradient, and normal coronary arteries.  I referred him to Dr. Regino Schultze at [facility] who performed successful mitral balloon valvuloplasty with almost immediate improvement in his pulmonary artery pressures.  12/25/2018.  He felt clinically improved.   Because of increasing symptoms of mitral stenosis I referred him to Dr. Cornelius Moras who ultimately performed mitral valve replacement with a Sorin [employer] bileaflet mechanical valve (29 mm) done minimally invasively on 05/14/2019.    He has done well postoperatively except for volume overload controlled with oral diuretics and tachycardia.   I did obtain a 2D echo 06/23/2019 revealed normal LV size and function, no evidence of pericardial effusion with a well-functioning  aortic mechanical prosthesis.  An event monitor performed 05/25/2019 showed sinus rhythm/sinus tachycardia with occasional PVCs.      He has seen Dr. Cornelius Moras in the office as well as how [facility]  His 2D echo did reveal normal LV size and function with a well-functioning aortic mechanical prosthesis and no evidence of pericardial effusion.  His palpitations have improved as well.  His dyspnea is markedly improved in addition on oral diuretics.  He underwent left total hip replacement tachycardia [facility] which has had some complications and infection.  He is walking with crutches.   Since I saw him in the office a year ago he continues to do well.  He did have his beta-blocker down titrated recently by Azalee Course, PA-C in March of this year.  He apparently needs a hip replacement and esophageal procedure as well which she is cleared to have from a cardiac point of view although he will need Lovenox bridging.  He denies chest pain or shortness of breath.   Current Meds  Medication Sig   ADVAIR HFA 115-21 MCG/ACT inhaler Inhale 1 puff into the lungs 2 (two) times daily.   albuterol (VENTOLIN HFA) 108 (90 Base) MCG/ACT inhaler Inhale 1-2 puffs into the lungs 4 (four) times daily as needed for wheezing or shortness of breath.   busPIRone (BUSPAR) 15 MG tablet Take 15 mg by mouth 3 (three) times daily.   celecoxib (CELEBREX) 200 MG capsule Take 200 mg by mouth daily.    Cholecalciferol (VITAMIN D-1000 MAX  ST) 25 MCG (1000 UT) tablet Take 1,000 Units by mouth once a week.    clonazePAM (KLONOPIN) 0.5 MG tablet Take 0.5 mg by mouth at bedtime.   Desvenlafaxine Succinate ER 25 MG TB24 Take 25 mg by mouth 2 (two) times daily.   folic acid (FOLVITE) 1 MG tablet Take by mouth.    furosemide (LASIX) 40 MG tablet Take 1 tablet (40 mg total) by mouth daily. You may take an extra 20mg  as needed for volume overload.   gabapentin (NEURONTIN) 800 MG tablet Take 800 mg by mouth 3 (three) times daily.    levocetirizine (XYZAL) 5 MG tablet Take 5 mg by mouth in the morning and at bedtime.   metoprolol tartrate (LOPRESSOR) 25 MG tablet Take 0.5 tablets (12.5 mg total) by mouth 2 (two) times daily.   Oxycodone HCl 10 MG TABS Take 15 mg by mouth every 4 (four) hours as needed (pain).    potassium chloride (KLOR-CON) 10 MEQ tablet Take 1 tablet (10 mEq total) by mouth daily.   risperiDONE (RISPERDAL) 0.25 MG tablet Take 0.25 mg by mouth at bedtime.   tiZANidine (ZANAFLEX) 4 MG tablet Take 4 mg by mouth 3 (three) times daily.   warfarin (COUMADIN) 5 MG tablet TAKE 1 TO 1.5 TABLETS BY MOUTH DAILY AS DIRECTED BY COUMADIN CLINIC   XTAMPZA ER 36 MG C12A Take 1 capsule by mouth 2 (two) times daily.     No Known Allergies  Social History   Socioeconomic History   Marital status: Married    Spouse name: Not on file   Number of children: Not on file   Years of education: Not on file   Highest education level: Not on file  Occupational History   Not on file  Tobacco Use   Smoking status: Never   Smokeless tobacco: Current    Types: Snuff  Vaping Use   Vaping Use: Never used  Substance and Sexual Activity   Alcohol use: No   Drug use: No   Sexual activity: Not on file  Other Topics Concern   Not on file  Social History Narrative   Not on file   Social Determinants of Health   Financial Resource Strain: Not on file  Food Insecurity: Not on file  Transportation Needs: Not on file  Physical Activity: Not on file  Stress: Not on file  Social Connections: Not on file  Intimate Partner Violence: Not on file     Review of Systems: General: negative for chills, fever, night sweats or weight changes.  Cardiovascular: negative for chest pain, dyspnea on exertion, edema, orthopnea, palpitations, paroxysmal nocturnal dyspnea or shortness of breath Dermatological: negative for rash Respiratory: negative for cough or wheezing Urologic: negative for hematuria Abdominal: negative for nausea,  vomiting, diarrhea, bright red blood per rectum, melena, or hematemesis Neurologic: negative for visual changes, syncope, or dizziness All other systems reviewed and are otherwise negative except as noted above.    Blood pressure 122/84, pulse 71, height 6\' 9"  (2.057 m), weight 214 lb 14.4 oz (97.5 kg).  General appearance: alert and no distress Neck: no adenopathy, no carotid bruit, no JVD, supple, symmetrical, trachea midline, and thyroid not enlarged, symmetric, no tenderness/mass/nodules Lungs: clear to auscultation bilaterally Heart: Crisp prosthetic valve sounds Extremities: extremities normal, atraumatic, no cyanosis or edema Pulses: 2+ and symmetric Skin: Skin color, texture, turgor normal. No rashes or lesions Neurologic: Grossly normal  EKG sinus rhythm at 71 with nonspecific ST and T wave changes.  Personally  reviewed this EKG.  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 Regino Schultze at [facility] 12/25/2018 with subsequent restenosis and ultimately minimally invasive mitral valve replacement by Dr. Cornelius Moras  with a Sorin [employer] bileaflet mechanical valve 05/14/2019.  2D echocardiogram performed 01/19/2021 revealed normal LV systolic function with a well-functioning mitral mechanical prosthesis.  This will be rechecked in March of next year.  He is totally asymptomatic.     Runell Gess MD FACP,FACC,FAHA, [facility] 05/09/2021 9:36 AM

## 2021-05-09 NOTE — Assessment & Plan Note (Signed)
History of rheumatic mitral stenosis status post valvulotomy by Dr. Regino Schultze at [facility] 12/25/2018 with subsequent restenosis and ultimately minimally invasive mitral valve replacement by Dr. Cornelius Moras  with a Sorin [employer] bileaflet mechanical valve 05/14/2019.  2D echocardiogram performed 01/19/2021 revealed normal LV systolic function with a well-functioning mitral mechanical prosthesis.  This will be rechecked in March of next year.  He is totally asymptomatic.

## 2021-05-17 LAB — POCT INR: INR: 6.1 — AB (ref 2.0–3.0)

## 2021-05-19 ENCOUNTER — Ambulatory Visit (INDEPENDENT_AMBULATORY_CARE_PROVIDER_SITE_OTHER): Payer: BC Managed Care – PPO | Admitting: Cardiology

## 2021-05-19 DIAGNOSIS — Z954 Presence of other heart-valve replacement: Secondary | ICD-10-CM | POA: Diagnosis not present

## 2021-05-19 DIAGNOSIS — I050 Rheumatic mitral stenosis: Secondary | ICD-10-CM

## 2021-05-26 ENCOUNTER — Ambulatory Visit (INDEPENDENT_AMBULATORY_CARE_PROVIDER_SITE_OTHER): Payer: BC Managed Care – PPO | Admitting: Pharmacist

## 2021-05-26 DIAGNOSIS — I050 Rheumatic mitral stenosis: Secondary | ICD-10-CM

## 2021-05-26 DIAGNOSIS — Z7901 Long term (current) use of anticoagulants: Secondary | ICD-10-CM

## 2021-05-26 DIAGNOSIS — Z954 Presence of other heart-valve replacement: Secondary | ICD-10-CM

## 2021-05-26 LAB — POCT INR: INR: 6.7 — AB (ref 2.0–3.0)

## 2021-05-29 ENCOUNTER — Ambulatory Visit (INDEPENDENT_AMBULATORY_CARE_PROVIDER_SITE_OTHER): Payer: BC Managed Care – PPO | Admitting: Cardiovascular Disease

## 2021-05-29 DIAGNOSIS — Z7901 Long term (current) use of anticoagulants: Secondary | ICD-10-CM | POA: Diagnosis not present

## 2021-05-29 DIAGNOSIS — Z954 Presence of other heart-valve replacement: Secondary | ICD-10-CM | POA: Diagnosis not present

## 2021-05-29 LAB — POCT INR: INR: 2.5 (ref 2.0–3.0)

## 2021-05-30 ENCOUNTER — Ambulatory Visit: Payer: BC Managed Care – PPO | Admitting: Cardiovascular Disease

## 2021-06-08 ENCOUNTER — Ambulatory Visit (INDEPENDENT_AMBULATORY_CARE_PROVIDER_SITE_OTHER): Payer: BC Managed Care – PPO | Admitting: Cardiology

## 2021-06-08 DIAGNOSIS — I050 Rheumatic mitral stenosis: Secondary | ICD-10-CM | POA: Diagnosis not present

## 2021-06-08 DIAGNOSIS — Z954 Presence of other heart-valve replacement: Secondary | ICD-10-CM | POA: Diagnosis not present

## 2021-06-08 LAB — POCT INR: INR: 6.4 — AB (ref 2.0–3.0)

## 2021-06-08 NOTE — Patient Instructions (Addendum)
Description   Hold dose today, tomorrow, and Saturday and then continue 1 tablet Daily, except 0.5 tablet Tuesday and Friday and repeat INR 1 week.

## 2021-06-13 ENCOUNTER — Ambulatory Visit (INDEPENDENT_AMBULATORY_CARE_PROVIDER_SITE_OTHER): Payer: BC Managed Care – PPO | Admitting: Cardiovascular Disease

## 2021-06-13 DIAGNOSIS — I050 Rheumatic mitral stenosis: Secondary | ICD-10-CM | POA: Diagnosis not present

## 2021-06-13 DIAGNOSIS — Z954 Presence of other heart-valve replacement: Secondary | ICD-10-CM

## 2021-06-13 LAB — POCT INR: INR: 1.3 — AB (ref 2.0–3.0)

## 2021-06-13 NOTE — Patient Instructions (Addendum)
Description   Called patient, already took one 5mg  tablet this morning.  Advised to take another 5mg  tablet and then take 1 tablet Daily, except 0.5 tablet Tuesday and Friday and repeat INR 1 week.

## 2021-06-20 ENCOUNTER — Telehealth: Payer: Self-pay

## 2021-06-20 LAB — POCT INR: INR: 4 — AB (ref 2.0–3.0)

## 2021-06-20 NOTE — Telephone Encounter (Signed)
Lmom for due inr

## 2021-06-21 ENCOUNTER — Ambulatory Visit (INDEPENDENT_AMBULATORY_CARE_PROVIDER_SITE_OTHER): Payer: BC Managed Care – PPO | Admitting: Cardiology

## 2021-06-21 DIAGNOSIS — I050 Rheumatic mitral stenosis: Secondary | ICD-10-CM

## 2021-06-21 DIAGNOSIS — Z954 Presence of other heart-valve replacement: Secondary | ICD-10-CM | POA: Diagnosis not present

## 2021-06-21 NOTE — Patient Instructions (Addendum)
Description   Advised patient to hold warfarin today and then continue current dose of 1 tablet Daily, except 0.5 tablet Tuesday and Friday and repeat INR 1 week.

## 2021-06-28 ENCOUNTER — Telehealth: Payer: Self-pay

## 2021-06-28 NOTE — Telephone Encounter (Signed)
Lmom for overdue inr 

## 2021-06-29 ENCOUNTER — Ambulatory Visit (INDEPENDENT_AMBULATORY_CARE_PROVIDER_SITE_OTHER): Payer: BC Managed Care – PPO | Admitting: Cardiology

## 2021-06-29 DIAGNOSIS — Z954 Presence of other heart-valve replacement: Secondary | ICD-10-CM | POA: Diagnosis not present

## 2021-06-29 DIAGNOSIS — Z7901 Long term (current) use of anticoagulants: Secondary | ICD-10-CM | POA: Diagnosis not present

## 2021-06-29 DIAGNOSIS — I050 Rheumatic mitral stenosis: Secondary | ICD-10-CM

## 2021-06-29 LAB — POCT INR: INR: 4.2 — AB (ref 2.0–3.0)

## 2021-06-29 NOTE — Patient Instructions (Signed)
Description   Advised patient to hold warfarin today and then continue current dose of 1 tablet Daily, except 0.5 tablet Tuesday and Friday and repeat INR 1 week.   Sept 2 pre-op for stomach/acid reflux surgery probably early the next week.  Advised he hold warfarin starting Sept 1 and will need lovenox bridge.

## 2021-07-05 ENCOUNTER — Ambulatory Visit (INDEPENDENT_AMBULATORY_CARE_PROVIDER_SITE_OTHER): Payer: BC Managed Care – PPO | Admitting: Cardiology

## 2021-07-05 DIAGNOSIS — Z954 Presence of other heart-valve replacement: Secondary | ICD-10-CM | POA: Diagnosis not present

## 2021-07-05 DIAGNOSIS — I050 Rheumatic mitral stenosis: Secondary | ICD-10-CM

## 2021-07-05 DIAGNOSIS — Z7901 Long term (current) use of anticoagulants: Secondary | ICD-10-CM | POA: Diagnosis not present

## 2021-07-05 LAB — POCT INR: INR: 4.9 — AB (ref 2.0–3.0)

## 2021-07-05 MED ORDER — WARFARIN SODIUM 5 MG PO TABS: ORAL_TABLET | ORAL | 1 refills | Status: DC

## 2021-07-12 ENCOUNTER — Other Ambulatory Visit: Payer: Self-pay

## 2021-07-12 ENCOUNTER — Telehealth: Payer: Self-pay

## 2021-07-12 MED ORDER — ENOXAPARIN SODIUM 100 MG/ML IJ SOSY: 100.0000 mg | PREFILLED_SYRINGE | Freq: Two times a day (BID) | INTRAMUSCULAR | 1 refills | Status: DC

## 2021-07-12 NOTE — Telephone Encounter (Signed)
I spoke to the patient and he will call us on 9/2 to further discuss Lovenox Bridging prior to procedure date.

## 2021-07-14 ENCOUNTER — Ambulatory Visit (INDEPENDENT_AMBULATORY_CARE_PROVIDER_SITE_OTHER): Payer: BC Managed Care – PPO | Admitting: Cardiovascular Disease

## 2021-07-14 ENCOUNTER — Telehealth: Payer: Self-pay

## 2021-07-14 DIAGNOSIS — Z5181 Encounter for therapeutic drug level monitoring: Secondary | ICD-10-CM | POA: Diagnosis not present

## 2021-07-14 DIAGNOSIS — Z954 Presence of other heart-valve replacement: Secondary | ICD-10-CM

## 2021-07-14 LAB — POCT INR: INR: 4.1 — AB (ref 2.0–3.0)

## 2021-07-14 NOTE — Telephone Encounter (Signed)
I spoke to the patient and he informed me that they have postponed his 9/6 GI surgery.  He will inform us of a new date.

## 2021-07-19 ENCOUNTER — Telehealth: Payer: Self-pay | Admitting: Cardiovascular Disease

## 2021-07-19 NOTE — Telephone Encounter (Signed)
Contacted [employer]- they stated they needed a signed form to be able to reauthorize. They will send over order form to be signed by Dr.Berry.   Verified fax number-  Will await fax and will send back once signed.

## 2021-07-19 NOTE — Telephone Encounter (Signed)
Shonique from [employer] calling to request a verbal authorization to reauthorize for their INR home program. Phone: [phone] option 4

## 2021-07-21 ENCOUNTER — Telehealth: Payer: Self-pay

## 2021-07-21 NOTE — Telephone Encounter (Signed)
Lmom for Warfarin test overdue

## 2021-07-24 ENCOUNTER — Ambulatory Visit (INDEPENDENT_AMBULATORY_CARE_PROVIDER_SITE_OTHER): Payer: BC Managed Care – PPO | Admitting: Cardiology

## 2021-07-24 DIAGNOSIS — Z954 Presence of other heart-valve replacement: Secondary | ICD-10-CM | POA: Diagnosis not present

## 2021-07-24 DIAGNOSIS — Z5181 Encounter for therapeutic drug level monitoring: Secondary | ICD-10-CM

## 2021-07-24 LAB — POCT INR: INR: 3.3 — AB (ref 2.0–3.0)

## 2021-08-07 ENCOUNTER — Telehealth: Payer: Self-pay

## 2021-08-07 NOTE — Telephone Encounter (Signed)
Lmom for overdue inr self tester  

## 2021-08-08 ENCOUNTER — Ambulatory Visit (INDEPENDENT_AMBULATORY_CARE_PROVIDER_SITE_OTHER): Payer: BC Managed Care – PPO | Admitting: Cardiovascular Disease

## 2021-08-08 DIAGNOSIS — Z954 Presence of other heart-valve replacement: Secondary | ICD-10-CM | POA: Diagnosis not present

## 2021-08-08 DIAGNOSIS — Z5181 Encounter for therapeutic drug level monitoring: Secondary | ICD-10-CM | POA: Diagnosis not present

## 2021-08-08 LAB — POCT INR: INR: 3 (ref 2.0–3.0)

## 2021-08-09 ENCOUNTER — Encounter: Payer: Self-pay | Admitting: Cardiology

## 2021-08-09 ENCOUNTER — Telehealth: Payer: Self-pay | Admitting: Orthopaedic Surgery

## 2021-08-09 DIAGNOSIS — Z954 Presence of other heart-valve replacement: Secondary | ICD-10-CM

## 2021-08-09 DIAGNOSIS — I050 Rheumatic mitral stenosis: Secondary | ICD-10-CM

## 2021-08-09 NOTE — Telephone Encounter (Signed)
Please advise 

## 2021-08-09 NOTE — Telephone Encounter (Signed)
Pt called and said that he had 6 surgeries with [facility] and his hip is still not right. He would like to see Dr.Blackman for second opinion.   CB [phone]

## 2021-08-09 NOTE — Progress Notes (Signed)
This encounter was created in error - please disregard.

## 2021-08-10 NOTE — Telephone Encounter (Signed)
Called and advised pt. He stated understanding  

## 2021-08-17 LAB — POCT INR: INR: 4.3 — AB (ref 2.0–3.0)

## 2021-08-18 ENCOUNTER — Ambulatory Visit (INDEPENDENT_AMBULATORY_CARE_PROVIDER_SITE_OTHER): Payer: BC Managed Care – PPO | Admitting: Cardiovascular Disease

## 2021-08-18 DIAGNOSIS — Z954 Presence of other heart-valve replacement: Secondary | ICD-10-CM

## 2021-08-18 DIAGNOSIS — Z5181 Encounter for therapeutic drug level monitoring: Secondary | ICD-10-CM | POA: Diagnosis not present

## 2021-08-22 ENCOUNTER — Telehealth: Payer: Self-pay

## 2021-08-22 NOTE — Telephone Encounter (Signed)
LMOM FOR OVERDUE INR 

## 2021-08-23 ENCOUNTER — Ambulatory Visit (INDEPENDENT_AMBULATORY_CARE_PROVIDER_SITE_OTHER): Payer: BC Managed Care – PPO | Admitting: Pharmacist

## 2021-08-23 DIAGNOSIS — I050 Rheumatic mitral stenosis: Secondary | ICD-10-CM | POA: Diagnosis not present

## 2021-08-23 DIAGNOSIS — Z954 Presence of other heart-valve replacement: Secondary | ICD-10-CM

## 2021-08-23 LAB — POCT INR: INR: 1.5 — AB (ref 2.0–3.0)

## 2021-08-23 NOTE — Patient Instructions (Addendum)
Description   Take 2 tablets today and 1.5 tablets tomorrow.  Then continue taking 5 mg daily except 2.5 mg each Sunday, Tuesday and Friday.  Repeat INR 1 week.

## 2021-08-30 LAB — POCT INR: INR: 4.4 — AB (ref 2.0–3.0)

## 2021-08-31 ENCOUNTER — Ambulatory Visit (INDEPENDENT_AMBULATORY_CARE_PROVIDER_SITE_OTHER): Payer: BC Managed Care – PPO | Admitting: Cardiology

## 2021-08-31 DIAGNOSIS — Z5181 Encounter for therapeutic drug level monitoring: Secondary | ICD-10-CM | POA: Diagnosis not present

## 2021-08-31 DIAGNOSIS — Z954 Presence of other heart-valve replacement: Secondary | ICD-10-CM | POA: Diagnosis not present

## 2021-09-08 ENCOUNTER — Telehealth: Payer: Self-pay

## 2021-09-08 NOTE — Telephone Encounter (Signed)
Lpm to check INR today. °

## 2021-09-11 ENCOUNTER — Ambulatory Visit (INDEPENDENT_AMBULATORY_CARE_PROVIDER_SITE_OTHER): Payer: BC Managed Care – PPO | Admitting: Cardiology

## 2021-09-11 DIAGNOSIS — Z954 Presence of other heart-valve replacement: Secondary | ICD-10-CM | POA: Diagnosis not present

## 2021-09-11 DIAGNOSIS — Z5181 Encounter for therapeutic drug level monitoring: Secondary | ICD-10-CM

## 2021-09-11 LAB — POCT INR: INR: 2.2 (ref 2.0–3.0)

## 2021-09-19 ENCOUNTER — Ambulatory Visit (INDEPENDENT_AMBULATORY_CARE_PROVIDER_SITE_OTHER): Payer: BC Managed Care – PPO | Admitting: Cardiology

## 2021-09-19 DIAGNOSIS — Z5181 Encounter for therapeutic drug level monitoring: Secondary | ICD-10-CM

## 2021-09-19 LAB — POCT INR: INR: 2.3 (ref 2.0–3.0)

## 2021-09-19 NOTE — Patient Instructions (Signed)
Description   Called and spoke to pt and instructed him to take 1.5 tablets of warfarin today and then START taking warfarin 1 tablet daily except for 1/2 a tablet on Sunday and Fridays. Recheck INR in 1 week. [facility] [phone].

## 2021-10-02 ENCOUNTER — Telehealth: Payer: Self-pay

## 2021-10-02 NOTE — Telephone Encounter (Signed)
I spoke to the patient and reminded him to check INR °

## 2021-10-03 ENCOUNTER — Ambulatory Visit (INDEPENDENT_AMBULATORY_CARE_PROVIDER_SITE_OTHER): Payer: BC Managed Care – PPO | Admitting: Internal Medicine

## 2021-10-03 DIAGNOSIS — Z5181 Encounter for therapeutic drug level monitoring: Secondary | ICD-10-CM | POA: Diagnosis not present

## 2021-10-03 LAB — POCT INR: INR: 4.6 — AB (ref 2.0–3.0)

## 2021-10-03 NOTE — Patient Instructions (Signed)
Description   Called and spoke to pt and instructed him to hold warfarin today and then continue taking warfarin 1 tablet daily except for 1/2 a tablet on Sunday and Fridays. Recheck INR in 1 week. [facility] [phone].

## 2021-10-12 ENCOUNTER — Telehealth: Payer: Self-pay

## 2021-10-12 LAB — POCT INR: INR: 2.6 (ref 2.0–3.0)

## 2021-10-12 NOTE — Telephone Encounter (Signed)
Lpm to check INR 

## 2021-10-13 ENCOUNTER — Ambulatory Visit (INDEPENDENT_AMBULATORY_CARE_PROVIDER_SITE_OTHER): Payer: BC Managed Care – PPO | Admitting: Cardiovascular Disease

## 2021-10-13 DIAGNOSIS — Z954 Presence of other heart-valve replacement: Secondary | ICD-10-CM

## 2021-10-13 DIAGNOSIS — Z5181 Encounter for therapeutic drug level monitoring: Secondary | ICD-10-CM | POA: Diagnosis not present

## 2021-10-19 LAB — POCT INR: INR: 2.6 (ref 2.0–3.0)

## 2021-10-22 LAB — POCT INR: INR: 3.5 — AB (ref 2.0–3.0)

## 2021-10-23 ENCOUNTER — Telehealth: Payer: Self-pay

## 2021-10-23 NOTE — Telephone Encounter (Signed)
Left message for pt to call back w/ INR results.

## 2021-10-25 ENCOUNTER — Ambulatory Visit (INDEPENDENT_AMBULATORY_CARE_PROVIDER_SITE_OTHER): Payer: BC Managed Care – PPO | Admitting: Cardiology

## 2021-10-25 ENCOUNTER — Telehealth: Payer: Self-pay

## 2021-10-25 DIAGNOSIS — Z5181 Encounter for therapeutic drug level monitoring: Secondary | ICD-10-CM

## 2021-10-25 DIAGNOSIS — Z954 Presence of other heart-valve replacement: Secondary | ICD-10-CM | POA: Diagnosis not present

## 2021-10-25 NOTE — Telephone Encounter (Signed)
Lpm to check INR 

## 2021-11-07 ENCOUNTER — Encounter: Payer: Self-pay | Admitting: Pharmacist

## 2021-11-07 DIAGNOSIS — Z954 Presence of other heart-valve replacement: Secondary | ICD-10-CM

## 2021-11-07 DIAGNOSIS — I050 Rheumatic mitral stenosis: Secondary | ICD-10-CM

## 2021-11-07 NOTE — Progress Notes (Signed)
LMOM to check INRThis encounter was created in error - please disregard.

## 2021-11-07 NOTE — Telephone Encounter (Signed)
This encounter was created in error - please disregard.

## 2021-11-10 ENCOUNTER — Ambulatory Visit (INDEPENDENT_AMBULATORY_CARE_PROVIDER_SITE_OTHER): Payer: BC Managed Care – PPO

## 2021-11-10 DIAGNOSIS — Z954 Presence of other heart-valve replacement: Secondary | ICD-10-CM | POA: Diagnosis not present

## 2021-11-10 DIAGNOSIS — I050 Rheumatic mitral stenosis: Secondary | ICD-10-CM

## 2021-11-10 DIAGNOSIS — Z5181 Encounter for therapeutic drug level monitoring: Secondary | ICD-10-CM

## 2021-11-10 LAB — POCT INR: INR: 2.4 (ref 2.0–3.0)

## 2021-11-10 NOTE — Patient Instructions (Signed)
-   Spoke w/ pt.  Advised him to take 1 tablet warfarin tonight, then  - continue taking warfarin 1 tablet daily except for 1/2 a tablet on Sunday and Fridays.  - Recheck INR in 2 weeks.  [facility] [phone].

## 2021-11-19 LAB — POCT INR: INR: 2.8 (ref 2.0–3.0)

## 2021-11-20 ENCOUNTER — Ambulatory Visit (INDEPENDENT_AMBULATORY_CARE_PROVIDER_SITE_OTHER): Payer: 59 | Admitting: Cardiovascular Disease

## 2021-11-20 DIAGNOSIS — Z5181 Encounter for therapeutic drug level monitoring: Secondary | ICD-10-CM

## 2021-11-20 DIAGNOSIS — Z954 Presence of other heart-valve replacement: Secondary | ICD-10-CM

## 2021-12-02 ENCOUNTER — Other Ambulatory Visit: Payer: Self-pay | Admitting: Cardiovascular Disease

## 2021-12-05 ENCOUNTER — Telehealth: Payer: Self-pay

## 2021-12-05 NOTE — Telephone Encounter (Signed)
Lmom to check inr overdue

## 2021-12-07 ENCOUNTER — Telehealth: Payer: Self-pay

## 2021-12-07 NOTE — Telephone Encounter (Signed)
I tried reaching pt to check INR, but mailbox full. 

## 2021-12-08 ENCOUNTER — Telehealth: Payer: Self-pay

## 2021-12-08 NOTE — Telephone Encounter (Signed)
I tried calling pt to check INR, but no answer and VM full.

## 2021-12-11 LAB — POCT INR: INR: 3 (ref 2.0–3.0)

## 2021-12-12 ENCOUNTER — Telehealth: Payer: Self-pay | Admitting: *Deleted

## 2021-12-12 NOTE — Telephone Encounter (Signed)
Pt is overdue for INR check. Called pt but unable to leave message on machine because pt's mailbox is full.

## 2021-12-13 ENCOUNTER — Ambulatory Visit (INDEPENDENT_AMBULATORY_CARE_PROVIDER_SITE_OTHER): Payer: 59 | Admitting: Cardiovascular Disease

## 2021-12-13 DIAGNOSIS — Z5181 Encounter for therapeutic drug level monitoring: Secondary | ICD-10-CM | POA: Diagnosis not present

## 2021-12-13 DIAGNOSIS — Z954 Presence of other heart-valve replacement: Secondary | ICD-10-CM

## 2021-12-28 ENCOUNTER — Telehealth: Payer: Self-pay

## 2021-12-28 NOTE — Telephone Encounter (Signed)
Reminded pt to check INR. Verbalized understanding °

## 2022-01-04 LAB — POCT INR: INR: 2.2 (ref 2.0–3.0)

## 2022-01-05 ENCOUNTER — Ambulatory Visit (INDEPENDENT_AMBULATORY_CARE_PROVIDER_SITE_OTHER): Payer: 59 | Admitting: Cardiology

## 2022-01-05 DIAGNOSIS — Z5181 Encounter for therapeutic drug level monitoring: Secondary | ICD-10-CM | POA: Diagnosis not present

## 2022-01-05 DIAGNOSIS — Z954 Presence of other heart-valve replacement: Secondary | ICD-10-CM

## 2022-01-18 ENCOUNTER — Telehealth: Payer: Self-pay

## 2022-01-18 NOTE — Telephone Encounter (Signed)
Lpm to check INR 

## 2022-01-19 ENCOUNTER — Other Ambulatory Visit (HOSPITAL_BASED_OUTPATIENT_CLINIC_OR_DEPARTMENT_OTHER): Payer: Self-pay | Admitting: Cardiovascular Disease

## 2022-01-19 MED ORDER — WARFARIN SODIUM 5 MG PO TABS: ORAL_TABLET | ORAL | 1 refills | Status: DC

## 2022-01-19 NOTE — Telephone Encounter (Signed)
Received fax from [facility] requesting refills for Warfarin 5 mg. Routing to [employer] pool. ? ?

## 2022-01-21 LAB — POCT INR: INR: 3.6 — AB (ref 2.0–3.0)

## 2022-01-22 ENCOUNTER — Other Ambulatory Visit: Payer: Self-pay

## 2022-01-22 ENCOUNTER — Ambulatory Visit (HOSPITAL_COMMUNITY): Payer: 59 | Attending: Internal Medicine

## 2022-01-22 ENCOUNTER — Ambulatory Visit (INDEPENDENT_AMBULATORY_CARE_PROVIDER_SITE_OTHER): Payer: 59 | Admitting: Cardiology

## 2022-01-22 DIAGNOSIS — Z5181 Encounter for therapeutic drug level monitoring: Secondary | ICD-10-CM | POA: Diagnosis not present

## 2022-01-22 DIAGNOSIS — Z954 Presence of other heart-valve replacement: Secondary | ICD-10-CM | POA: Diagnosis present

## 2022-01-22 LAB — ECHOCARDIOGRAM COMPLETE
Area-P 1/2: 3.19 cm2
MV VTI: 1.35 cm2
P 1/2 time: 379 msec
S' Lateral: 3.8 cm

## 2022-01-30 LAB — POCT INR: INR: 3.5 — AB (ref 2.0–3.0)

## 2022-01-31 ENCOUNTER — Ambulatory Visit (INDEPENDENT_AMBULATORY_CARE_PROVIDER_SITE_OTHER): Payer: 59 | Admitting: Cardiovascular Disease

## 2022-01-31 DIAGNOSIS — Z5181 Encounter for therapeutic drug level monitoring: Secondary | ICD-10-CM

## 2022-01-31 DIAGNOSIS — Z954 Presence of other heart-valve replacement: Secondary | ICD-10-CM

## 2022-02-06 ENCOUNTER — Ambulatory Visit: Payer: BC Managed Care – PPO | Admitting: Physician Assistant

## 2022-02-06 LAB — POCT INR: INR: 3.5 — AB (ref 2.0–3.0)

## 2022-02-07 ENCOUNTER — Telehealth: Payer: Self-pay

## 2022-02-07 ENCOUNTER — Ambulatory Visit (INDEPENDENT_AMBULATORY_CARE_PROVIDER_SITE_OTHER): Payer: 59 | Admitting: Cardiology

## 2022-02-07 DIAGNOSIS — Z5181 Encounter for therapeutic drug level monitoring: Secondary | ICD-10-CM | POA: Diagnosis not present

## 2022-02-07 DIAGNOSIS — Z954 Presence of other heart-valve replacement: Secondary | ICD-10-CM | POA: Diagnosis not present

## 2022-02-07 NOTE — Telephone Encounter (Signed)
done

## 2022-02-09 ENCOUNTER — Ambulatory Visit: Payer: BC Managed Care – PPO | Admitting: Adult Health

## 2022-02-12 ENCOUNTER — Ambulatory Visit (INDEPENDENT_AMBULATORY_CARE_PROVIDER_SITE_OTHER): Payer: 59 | Admitting: Internal Medicine

## 2022-02-12 DIAGNOSIS — Z5181 Encounter for therapeutic drug level monitoring: Secondary | ICD-10-CM | POA: Diagnosis not present

## 2022-02-12 DIAGNOSIS — Z954 Presence of other heart-valve replacement: Secondary | ICD-10-CM

## 2022-02-12 LAB — POCT INR: INR: 3.4 — AB (ref 2.0–3.0)

## 2022-02-14 ENCOUNTER — Ambulatory Visit (INDEPENDENT_AMBULATORY_CARE_PROVIDER_SITE_OTHER): Payer: 59 | Admitting: Physician Assistant

## 2022-02-14 ENCOUNTER — Encounter: Payer: Self-pay | Admitting: Physician Assistant

## 2022-02-14 VITALS — BP 148/86 | HR 89 | Ht 74.0 in | Wt 208.2 lb

## 2022-02-14 DIAGNOSIS — I480 Paroxysmal atrial fibrillation: Secondary | ICD-10-CM | POA: Diagnosis not present

## 2022-02-14 DIAGNOSIS — Z954 Presence of other heart-valve replacement: Secondary | ICD-10-CM | POA: Diagnosis not present

## 2022-02-14 DIAGNOSIS — R079 Chest pain, unspecified: Secondary | ICD-10-CM | POA: Diagnosis not present

## 2022-02-14 DIAGNOSIS — I5032 Chronic diastolic (congestive) heart failure: Secondary | ICD-10-CM

## 2022-02-14 MED ORDER — METOPROLOL TARTRATE 25 MG PO TABS: 12.5000 mg | ORAL_TABLET | Freq: Two times a day (BID) | ORAL | 3 refills | Status: AC

## 2022-02-14 NOTE — Progress Notes (Signed)
?Cardiology Office Note:   ? ?Date:  02/15/2022  ? ?ID:  Darren Haynes, DOB [birth year], MRN [id number] ? ?PCP:  [facility] ?  ?[facility] Providers ?Cardiologist:  Nanetta Batty, MD ?Cardiology APP:  Azalee Course, [place]    ? ?Referring MD: [facility]  ? ?Chief Complaint  ?Patient presents with  ? Follow-up  ?  Seen for Dr. Allyson Sabal  ? Chest Pain  ? ? ?History of Present Illness:   ? ?Darren Haynes is a 36 y.o. 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  He was first seen by Dr. Allyson Sabal in January 2020, echocardiogram performed at the time demonstrated normal LV function, peak mitral gradient of 47 mmHg, with mean gradient of 29 mmHg.  Mitral valve area 1.5 cm?.  He had severe dyspnea and lower extremity edema.  TEE demonstrated classic rheumatic mitral valve stenosis with mean gradient of 15 mmHg.  Left and right heart cath revealed significant mitral valve gradient, normal coronaries.  He was referred to Dr. Modesto Charon at [facility] who performed a successful mitral balloon valvuloplasty in February 2020 with immediate improvement in his pulmonary artery pressure and symptom for the next 2 months.  However he returned in April 2020 with progressive dyspnea and lower extremity edema.  TEE obtained on 03/27/2019 demonstrated moderate to severe MS.  Echocardiogram revealed mild LV dysfunction with EF of 45 to 50%.  Event monitor showed PACs, PVCs and episode of PSVT and NSVT. Due to progressive worsening symptom of mitral stenosis, he eventually underwent mitral valve replacement with a Sorin [employer] bileaflet mechanical valve 29 mm performed on 05/14/2019.  Postop course complicated by volume overload and postop A. fib which he converted to sinus rhythm on IV amiodarone therapy. On postop day #2, he had an episode of complete heart block.  Lopressor PRN was  discontinued.  He was discharged on 30-day event monitor.  Amiodarone therapy was also discontinued as well.  Echocardiogram obtained on 06/23/2019 revealed a normal LV function, no evidence of pericardial effusion with well functioning mechanical prosthesis. He was started on low-dose beta-blocker due to tachycardia.   ?  ?He was seen by neurology at [facility] in August for diplopia, based on his clinical symptom, image finding and CSF result, it is very unlikely this patient has multiple sclerosis.  He was referred to ophthalmology service. He has also been seen by [facility] ortho for bilateral hip pain. 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  He was deemed not a candidate for free vascularized fibular graft. He eventually underwent left total hip replacement at [facility].  Postop course complicated by bacteremia requiring placement of PICC line and IV antibiotic.   ? ?He went to [facility] ED on 11/29/2019 for chest pain.  Chest CT was negative for PE but demonstrated right upper lobe groundglass opacity with patchy nodular opacities in the left lower lobe, suspicious for multifocal infection. Based on the ED physicians report, they did not suspect acute pulmonary infection or any infection stemming from his PICC line.  Right upper extremity ultrasound demonstrated superficial thrombosis of the right basilic vein at the site of PICC line.  He was last seen by Dr. Gery Pray in June 2022 at which time he was doing well.  Patient has been getting yearly echocardiogram.  Most recent echocardiogram was performed on 01/22/2022 that showed EF 55  to 60%, severe hypokinesis of the LV, entire inferior wall, normal RV size and RV EF, normal pulmonary artery systolic pressure, trivial MR, mechanical mitral valve present in the mitral valve location with normal function mild AI.  When compared to the previous study, no significant change was noted. ? ?Patient presents today for follow-up.  He denies any  worsening dyspnea.  For the past 5 weeks, he has been having some intermittent chest discomfort under his left breast.  Symptom seems to increase with activity.  Otherwise, his echocardiogram was reassuring.  I plan to see the patient back in 1 month for reassessment.  I will discuss with Dr. Allyson Sabal either today or tomorrow in regard to a possible nuclear stress test and will call the patient afterward. ? ?Past Medical History:  ?Diagnosis Date  ? Arthritis   ? Avascular necrosis (HCC)   ? CHF (congestive heart failure) (HCC)   ? Chronic back pain   ? Depression   ? Dyspnea   ? GERD (gastroesophageal reflux disease)   ? Heart murmur   ? Mitral valve stenosis   ? Panic attacks   ? Pneumonia   ? hx 4 time  ? Pulmonary hypertension (HCC)   ? Rheumatic fever   ? S/P minimally invasive mitral valve replacement with mechanical valve 05/14/2019  ? 29 mm Sorin [employer] bileaflet mechanical valve via right mini thoracotomy approach  ? ? ?Past Surgical History:  ?Procedure Laterality Date  ? BALLOON VALVULOPLASTY  12/24/2018  ? balloon mitral valvuloplasty at [facility]  ? JOINT REPLACEMENT    ? MITRAL VALVE REPLACEMENT Right 05/14/2019  ? Procedure: MINIMALLY INVASIVE MITRAL VALVE (MV) REPLACEMENT USING CARBOMEDICS OPTIFORM Size 72mm;  Surgeon: Purcell Nails, MD;  Location: [facility] OR;  Service: Open Heart Surgery;  Laterality: Right;  ? RIGHT/LEFT HEART CATH AND CORONARY ANGIOGRAPHY N/A 12/11/2018  ? Procedure: RIGHT/LEFT HEART CATH AND CORONARY ANGIOGRAPHY;  Surgeon: Runell Gess, MD;  Location: [facility] INVASIVE CV LAB;  Service: Cardiovascular;  Laterality: N/A;  ? TEE WITHOUT CARDIOVERSION N/A 12/11/2018  ? Procedure: TRANSESOPHAGEAL ECHOCARDIOGRAM (TEE);  Surgeon: Thurmon Fair, MD;  Location: [facility] ENDOSCOPY;  Service: Cardiovascular;  Laterality: N/A;  ? TEE WITHOUT CARDIOVERSION N/A 03/27/2019  ? Procedure: TRANSESOPHAGEAL ECHOCARDIOGRAM (TEE);  Surgeon: Elease Hashimoto Deloris Ping, MD;  Location: [facility] ENDOSCOPY;  Service: Cardiovascular;   Laterality: N/A;  ? TEE WITHOUT CARDIOVERSION N/A 05/14/2019  ? Procedure: TRANSESOPHAGEAL ECHOCARDIOGRAM (TEE);  Surgeon: Purcell Nails, MD;  Location: [facility] OR;  Service: Open Heart Surgery;  Laterality: N/A;  ? TYMPANOSTOMY TUBE PLACEMENT    ? ? ?Current Medications: ?Current Meds  ?Medication Sig  ? ADVAIR HFA 115-21 MCG/ACT inhaler Inhale 1 puff into the lungs 2 (two) times daily.  ? albuterol (VENTOLIN HFA) 108 (90 Base) MCG/ACT inhaler Inhale 1-2 puffs into the lungs 4 (four) times daily as needed for wheezing or shortness of breath.  ? busPIRone (BUSPAR) 15 MG tablet Take 15 mg by mouth 3 (three) times daily.  ? celecoxib (CELEBREX) 200 MG capsule Take 200 mg by mouth daily.   ? Cholecalciferol (VITAMIN D-1000 MAX ST) 25 MCG (1000 UT) tablet Take 1,000 Units by mouth once a week.   ? clonazePAM (KLONOPIN) 0.5 MG tablet Take 0.5 mg by mouth at bedtime.  ? Desvenlafaxine Succinate ER 25 MG TB24 Take 25 mg by mouth 2 (two) times daily.  ? ENBREL SURECLICK 50 MG/ML injection Inject into the skin.  ? enoxaparin (LOVENOX) 100 MG/ML injection Inject 1 mL (100 mg  total) into the skin every 12 (twelve) hours.  ? folic acid (FOLVITE) 1 MG tablet Take by mouth.   ? furosemide (LASIX) 40 MG tablet Take 1 tablet (40 mg total) by mouth daily. You may take an extra  as needed for volume overload.  ? gabapentin (NEURONTIN) 800 MG tablet Take 800 mg by mouth 3 (three) times daily.  ? levocetirizine (XYZAL) 5 MG tablet Take 5 mg by mouth in the morning and at bedtime.  ? potassium chloride (KLOR-CON) 10 MEQ tablet Take 1 tablet (10 mEq total) by mouth daily.  ? risperiDONE (RISPERDAL) 0.25 MG tablet Take 0.25 mg by mouth at bedtime.  ? tiZANidine (ZANAFLEX) 4 MG tablet Take 4 mg by mouth 3 (three) times daily.  ? Vitamin D, Ergocalciferol, (DRISDOL) 1.25 MG (50000 UNIT) CAPS capsule Take 50,000 Units by mouth once a week.  ? warfarin (COUMADIN) 5 MG tablet Take 1-2 tablets Daily or as prescribed by Coumadin Clinic.  ?  XTAMPZA ER 36 MG C12A Take 1 capsule by mouth 2 (two) times daily.  ? [DISCONTINUED] metoprolol tartrate (LOPRESSOR) 25 MG tablet Take 0.5 tablets (12.5 mg total) by mouth 2 (two) times daily.  ?  ? ?Allergies:   P

## 2022-02-14 NOTE — Patient Instructions (Signed)
Medication Instructions:  ?Your physician recommends that you continue on your current medications as directed. Please refer to the Current Medication list given to you today.  ?*If you need a refill on your cardiac medications before your next appointment, please call your pharmacy* ? ? ?Lab Work: ?NONE ordered at this time of appointment  ? ?If you have labs (blood work) drawn today and your tests are completely normal, you will receive your results only by: ?MyChart Message (if you have MyChart) OR ?A paper copy in the mail ?If you have any lab test that is abnormal or we need to change your treatment, we will call you to review the results. ? ? ?Testing/Procedures: ?NONE ordered at this time of appointment  ? ? ? ?Follow-Up: ?At [facility], you and your health needs are our priority.  As part of our continuing mission to provide you with exceptional heart care, we have created designated Provider Care Teams.  These Care Teams include your primary Cardiologist (physician) and Advanced Practice Providers (APPs -  Physician Assistants and Nurse Practitioners) who all work together to provide you with the care you need, when you need it. ? ?We recommend signing up for the patient portal called "MyChart".  Sign up information is provided on this After Visit Summary.  MyChart is used to connect with patients for Virtual Visits (Telemedicine).  Patients are able to view lab/test results, encounter notes, upcoming appointments, etc.  Non-urgent messages can be sent to your provider as well.   ?To learn more about what you can do with MyChart, go to [email or web address].   ? ?Your next appointment:   ?1 month(s) ? ?The format for your next appointment:   ?In Person ? ?Provider:   ?Azalee Course, PA-C      ? ? ?Other Instructions ? ? ? ?

## 2022-02-15 ENCOUNTER — Other Ambulatory Visit: Payer: Self-pay | Admitting: Physician Assistant

## 2022-02-15 ENCOUNTER — Encounter: Payer: Self-pay | Admitting: Physician Assistant

## 2022-02-15 ENCOUNTER — Other Ambulatory Visit (HOSPITAL_COMMUNITY): Payer: Self-pay | Admitting: Physician Assistant

## 2022-02-15 DIAGNOSIS — R079 Chest pain, unspecified: Secondary | ICD-10-CM

## 2022-02-22 LAB — POCT INR: INR: 2.5 (ref 2.0–3.0)

## 2022-02-23 ENCOUNTER — Ambulatory Visit (INDEPENDENT_AMBULATORY_CARE_PROVIDER_SITE_OTHER): Payer: 59 | Admitting: Cardiovascular Disease

## 2022-02-23 DIAGNOSIS — Z954 Presence of other heart-valve replacement: Secondary | ICD-10-CM | POA: Diagnosis not present

## 2022-02-23 DIAGNOSIS — Z5181 Encounter for therapeutic drug level monitoring: Secondary | ICD-10-CM | POA: Diagnosis not present

## 2022-03-01 ENCOUNTER — Encounter (HOSPITAL_COMMUNITY): Payer: Self-pay

## 2022-03-01 ENCOUNTER — Telehealth (HOSPITAL_COMMUNITY): Payer: Self-pay | Admitting: Emergency Medicine

## 2022-03-01 NOTE — Telephone Encounter (Signed)
Reaching out to patient to offer assistance regarding upcoming cardiac imaging study; pt verbalizes understanding of appt date/time, parking situation and where to check in, pre-test NPO status and medications ordered, and verified current allergies; name and call back number provided for further questions should they arise ?Rockwell AlexandriaSara Manila Rommel RN Navigator Cardiac Imaging ?[facility] ?[phone] office ?[phone] cell ? ?100mg  metoprolol tartrate ?Denies iv issues ?Holding tildalafil ?Arrival 1230 ? ?

## 2022-03-01 NOTE — Telephone Encounter (Signed)
Attempted to call patient regarding upcoming cardiac CT appointment. ?Left message on voicemail with name and callback number ?Rockwell Alexandria RN Navigator Cardiac Imaging ?[facility] ?[phone] Office ?[phone] Cell ? ?Mychart message sent ?

## 2022-03-02 ENCOUNTER — Ambulatory Visit (HOSPITAL_COMMUNITY)
Admission: RE | Admit: 2022-03-02 | Discharge: 2022-03-02 | Disposition: A | Discharge status: Home / Self Care | Payer: 59 | Source: Ambulatory Visit | Attending: Physician Assistant | Admitting: Physician Assistant

## 2022-03-02 DIAGNOSIS — Z952 Presence of prosthetic heart valve: Secondary | ICD-10-CM | POA: Diagnosis not present

## 2022-03-02 DIAGNOSIS — R079 Chest pain, unspecified: Secondary | ICD-10-CM | POA: Insufficient documentation

## 2022-03-02 DIAGNOSIS — I209 Angina pectoris, unspecified: Secondary | ICD-10-CM | POA: Diagnosis not present

## 2022-03-02 MED ORDER — IOHEXOL 350 MG/ML SOLN
100.0000 mL | Freq: Once | INTRAVENOUS | Status: AC | PRN
  Administered 2022-03-02: 100 mL via INTRAVENOUS

## 2022-03-02 MED ORDER — NITROGLYCERIN 0.4 MG SL SUBL
0.8000 mg | SUBLINGUAL_TABLET | Freq: Once | SUBLINGUAL | Status: AC
Start: 2022-03-02 — End: 2022-03-02
  Administered 2022-03-02: 0.8 mg via SUBLINGUAL

## 2022-03-02 MED ORDER — NITROGLYCERIN 0.4 MG SL SUBL
SUBLINGUAL_TABLET | SUBLINGUAL | Status: AC
  Filled 2022-03-02: qty 2

## 2022-03-06 NOTE — Progress Notes (Signed)
No sign of any blockage in the heart artery.

## 2022-03-07 LAB — POCT INR: INR: 6.7 — AB (ref 2.0–3.0)

## 2022-03-09 ENCOUNTER — Ambulatory Visit (INDEPENDENT_AMBULATORY_CARE_PROVIDER_SITE_OTHER): Payer: 59 | Admitting: Cardiology

## 2022-03-09 DIAGNOSIS — Z954 Presence of other heart-valve replacement: Secondary | ICD-10-CM | POA: Diagnosis not present

## 2022-03-09 DIAGNOSIS — Z5181 Encounter for therapeutic drug level monitoring: Secondary | ICD-10-CM | POA: Diagnosis not present

## 2022-03-15 ENCOUNTER — Encounter: Payer: Self-pay | Admitting: Physician Assistant

## 2022-03-15 ENCOUNTER — Ambulatory Visit (INDEPENDENT_AMBULATORY_CARE_PROVIDER_SITE_OTHER): Payer: 59 | Admitting: Physician Assistant

## 2022-03-15 ENCOUNTER — Ambulatory Visit (INDEPENDENT_AMBULATORY_CARE_PROVIDER_SITE_OTHER): Payer: 59

## 2022-03-15 VITALS — BP 128/70 | HR 84 | Ht 74.0 in | Wt 199.6 lb

## 2022-03-15 DIAGNOSIS — R0789 Other chest pain: Secondary | ICD-10-CM

## 2022-03-15 DIAGNOSIS — I480 Paroxysmal atrial fibrillation: Secondary | ICD-10-CM | POA: Diagnosis not present

## 2022-03-15 DIAGNOSIS — I050 Rheumatic mitral stenosis: Secondary | ICD-10-CM

## 2022-03-15 DIAGNOSIS — I5032 Chronic diastolic (congestive) heart failure: Secondary | ICD-10-CM | POA: Diagnosis not present

## 2022-03-15 DIAGNOSIS — Z954 Presence of other heart-valve replacement: Secondary | ICD-10-CM | POA: Diagnosis not present

## 2022-03-15 DIAGNOSIS — Z5181 Encounter for therapeutic drug level monitoring: Secondary | ICD-10-CM

## 2022-03-15 LAB — POCT INR: INR: 1.3 — AB (ref 2.0–3.0)

## 2022-03-15 NOTE — Progress Notes (Signed)
?Cardiology Office Note:   ? ?Date:  03/17/2022  ? ?ID:  Darren Haynes, DOB 1986-01-17, MRN [id number] ? ?PCP:  [facility] ?  ?[facility] Providers ?Cardiologist:  Nanetta Batty, MD ?Cardiology APP:  Azalee Course, [place]    ? ?Referring MD: [facility]  ? ?Chief Complaint  ?Patient presents with  ? Follow-up  ?  Seen for Dr. Allyson Sabal  ? ? ?History of Present Illness:   ? ?Darren Haynes is a 36 y.o. 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  He was first seen by Dr. Allyson Sabal in January 2020, echocardiogram performed at the time demonstrated normal LV function, peak mitral gradient of 47 mmHg, with mean gradient of 29 mmHg.  Mitral valve area 1.5 cm?.  He had severe dyspnea and lower extremity edema.  TEE demonstrated classic rheumatic mitral valve stenosis with mean gradient of 15 mmHg.  Left and right heart cath revealed significant mitral valve gradient, normal coronaries.  He was referred to Dr. Modesto Charon at [facility] who performed a successful mitral balloon valvuloplasty in February 2020 with immediate improvement in his pulmonary artery pressure and symptom for the next 2 months.  However he returned in April 2020 with progressive dyspnea and lower extremity edema.  TEE obtained on 03/27/2019 demonstrated moderate to severe MS.  Echocardiogram revealed mild LV dysfunction with EF of 45 to 50%.  Event monitor showed PACs, PVCs and episode of PSVT and NSVT. Due to progressive worsening symptom of mitral stenosis, he eventually underwent mitral valve replacement with a Sorin [employer] bileaflet mechanical valve 29 mm performed on 05/14/2019.  Postop course complicated by volume overload and postop A. fib which he converted to sinus rhythm on IV amiodarone therapy. On postop day #2, he had an episode of complete heart block.  Lopressor PRN was discontinued.   He was discharged on 30-day event monitor.  Amiodarone therapy was also discontinued as well.  Echocardiogram obtained on 06/23/2019 revealed a normal LV function, no evidence of pericardial effusion with well functioning mechanical prosthesis. He was started on low-dose beta-blocker due to tachycardia.   ?  ?He was seen by neurology at [facility] in August for diplopia, based on his clinical symptom, image finding and CSF result, it is very unlikely this patient has multiple sclerosis.  He was referred to ophthalmology service. He has also been seen by [facility] for bilateral hip pain. 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  He was deemed not a candidate for free vascularized fibular graft. He eventually underwent left total hip replacement at [facility].  Postop course complicated by bacteremia requiring placement of PICC line and IV antibiotic.   ?  ?He went to [facility] ED on 11/29/2019 for chest pain.  Chest CT was negative for PE but demonstrated right upper lobe groundglass opacity with patchy nodular opacities in the left lower lobe, suspicious for multifocal infection. Based on the ED physicians report, they did not suspect acute pulmonary infection or any infection stemming from his PICC line.  Right upper extremity ultrasound demonstrated superficial thrombosis of the right basilic vein at the site of PICC line.  He was last seen by Dr. Allyson Sabal in June 2022 at which time he was doing well.  Patient has been getting yearly echocardiogram.  Most recent echocardiogram was performed on 01/22/2022 that showed EF 55 to 60%, severe hypokinesis  of the LV, entire inferior wall, normal RV size and RVEF, normal pulmonary artery systolic pressure, trivial MR, mechanical mitral valve present in the mitral valve location with normal function mild AI.  When compared to the previous study, no significant change was noted. ? ?I last saw the patient on 03/02/2022 at which time he was having intermittent chest  discomfort for the past 5 weeks.  We ultimately proceeded with a coronary CT on 03/02/2022 that showed a coronary calcium score of 0, no sign of coronary artery disease noted.  Patient presents today for follow-up.  He continues to have intermittent focal chest pain on the left side.  We reviewed the recent coronary CT, at this time I do not recommend any further work-up.  He has no recent fever or chills in the past few days.  On physical exam, his mechanical mitral valve sounds very crisp without significant murmur.  He can follow-up next month with Dr. Allyson Sabal as scheduled.  Recent blood work showed INR of 6.7, we will have him rechecked INR today. ? ?Past Medical History:  ?Diagnosis Date  ? Arthritis   ? Avascular necrosis (HCC)   ? CHF (congestive heart failure) (HCC)   ? Chronic back pain   ? Depression   ? Dyspnea   ? GERD (gastroesophageal reflux disease)   ? Heart murmur   ? Mitral valve stenosis   ? Panic attacks   ? Pneumonia   ? hx 4 time  ? Pulmonary hypertension (HCC)   ? Rheumatic fever   ? S/P minimally invasive mitral valve replacement with mechanical valve 05/14/2019  ? 29 mm Sorin [employer] bileaflet mechanical valve via right mini thoracotomy approach  ? ? ?Past Surgical History:  ?Procedure Laterality Date  ? BALLOON VALVULOPLASTY  12/24/2018  ? balloon mitral valvuloplasty at [facility]  ? JOINT REPLACEMENT    ? MITRAL VALVE REPLACEMENT Right 05/14/2019  ? Procedure: MINIMALLY INVASIVE MITRAL VALVE (MV) REPLACEMENT USING CARBOMEDICS OPTIFORM Size 29mm;  Surgeon: Purcell Nails, MD;  Location: [facility] OR;  Service: Open Heart Surgery;  Laterality: Right;  ? RIGHT/LEFT HEART CATH AND CORONARY ANGIOGRAPHY N/A 12/11/2018  ? Procedure: RIGHT/LEFT HEART CATH AND CORONARY ANGIOGRAPHY;  Surgeon: Runell Gess, MD;  Location: [facility] INVASIVE CV LAB;  Service: Cardiovascular;  Laterality: N/A;  ? TEE WITHOUT CARDIOVERSION N/A 12/11/2018  ? Procedure: TRANSESOPHAGEAL ECHOCARDIOGRAM (TEE);  Surgeon: Thurmon Fair, MD;   Location: [facility] ENDOSCOPY;  Service: Cardiovascular;  Laterality: N/A;  ? TEE WITHOUT CARDIOVERSION N/A 03/27/2019  ? Procedure: TRANSESOPHAGEAL ECHOCARDIOGRAM (TEE);  Surgeon: Elease Hashimoto Deloris Ping, MD;  Location: [facility] ENDOSCOPY;  Service: Cardiovascular;  Laterality: N/A;  ? TEE WITHOUT CARDIOVERSION N/A 05/14/2019  ? Procedure: TRANSESOPHAGEAL ECHOCARDIOGRAM (TEE);  Surgeon: Purcell Nails, MD;  Location: [facility] OR;  Service: Open Heart Surgery;  Laterality: N/A;  ? TYMPANOSTOMY TUBE PLACEMENT    ? ? ?Current Medications: ?No outpatient medications have been marked as taking for the 03/15/22 encounter (Office Visit) with Azalee Course, PA.  ?  ? ?Allergies:   Patient has no known allergies.  ? ?Social History  ? ?Socioeconomic History  ? Marital status: Married  ?  Spouse name: Not on file  ? Number of children: Not on file  ? Years of education: Not on file  ? Highest education level: Not on file  ?Occupational History  ? Not on file  ?Tobacco Use  ? Smoking status: Never  ? Smokeless tobacco: Current  ?  Types: Snuff  ?Vaping Use  ?  Vaping Use: Never used  ?Substance and Sexual Activity  ? Alcohol use: No  ? Drug use: No  ? Sexual activity: Not on file  ?Other Topics Concern  ? Not on file  ?Social History Narrative  ? Not on file  ? ?Social Determinants of Health  ? ?Financial Resource Strain: Not on file  ?Food Insecurity: Not on file  ?Transportation Needs: Not on file  ?Physical Activity: Not on file  ?Stress: Not on file  ?Social Connections: Not on file  ?  ? ?Family History: ?The patient's family history includes Depression in his mother; Heart disease in his mother; Hyperlipidemia in his father; Hypertension in his father. ? ?ROS:   ?Please see the history of present illness.    ? All other systems reviewed and are negative. ? ?EKGs/Labs/Other Studies Reviewed:   ? ?The following studies were reviewed today: ? ?Echo 01/22/2022 ? 1. Left ventricular ejection fraction, by estimation, is 55 to 60%. The  ?left ventricle has  normal function. The left ventricle demonstrates  ?regional wall motion abnormalities (see scoring diagram/findings for  ?description). Left ventricular diastolic  ?function could not be evaluated. Ther

## 2022-03-15 NOTE — Patient Instructions (Signed)
-   TAKE 2 TABLETS TODAY ONLY ?- continue taking warfarin 1 tablet daily except for 1/2 a tablet on Sunday and Fridays.  ?- Recheck INR in 1 week.  ?Coumadin Clinic [phone].  ? ?

## 2022-03-15 NOTE — Patient Instructions (Signed)
Medication Instructions:  ?Your Physician recommend you continue on your current medication as directed.   ? ?*If you need a refill on your cardiac medications before your next appointment, please call your pharmacy* ? ?Follow-Up: ?At [facility], you and your health needs are our priority.  As part of our continuing mission to provide you with exceptional heart care, we have created designated Provider Care Teams.  These Care Teams include your primary Cardiologist (physician) and Advanced Practice Providers (APPs -  Physician Assistants and Nurse Practitioners) who all work together to provide you with the care you need, when you need it. ? ?We recommend signing up for the patient portal called "MyChart".  Sign up information is provided on this After Visit Summary.  MyChart is used to connect with patients for Virtual Visits (Telemedicine).  Patients are able to view lab/test results, encounter notes, upcoming appointments, etc.  Non-urgent messages can be sent to your provider as well.   ?To learn more about what you can do with MyChart, go to [email or web address].   ? ?Your next appointment:   ? Wednesday 05/09/2022 at 9 am ? ?The format for your next appointment:   ?In Person ? ?Provider:   ?Quay Burow, MD  ? ? ?Other Instructions ?Coumadin clinic before leaving. ? ?

## 2022-03-16 ENCOUNTER — Ambulatory Visit: Payer: 59 | Admitting: Physician Assistant

## 2022-03-17 ENCOUNTER — Encounter: Payer: Self-pay | Admitting: Physician Assistant

## 2022-03-19 ENCOUNTER — Other Ambulatory Visit: Payer: Self-pay | Admitting: Otolaryngology

## 2022-03-19 DIAGNOSIS — R221 Localized swelling, mass and lump, neck: Secondary | ICD-10-CM

## 2022-03-21 ENCOUNTER — Ambulatory Visit (INDEPENDENT_AMBULATORY_CARE_PROVIDER_SITE_OTHER): Payer: 59 | Admitting: Pharmacist Clinician (PhC)/ Clinical Pharmacy Specialist

## 2022-03-21 DIAGNOSIS — Z7901 Long term (current) use of anticoagulants: Secondary | ICD-10-CM | POA: Diagnosis not present

## 2022-03-21 DIAGNOSIS — I050 Rheumatic mitral stenosis: Secondary | ICD-10-CM

## 2022-03-21 DIAGNOSIS — Z954 Presence of other heart-valve replacement: Secondary | ICD-10-CM

## 2022-03-21 LAB — POCT INR: INR: 3.6 — AB (ref 2.0–3.0)

## 2022-03-29 ENCOUNTER — Telehealth: Payer: Self-pay

## 2022-03-29 NOTE — Telephone Encounter (Signed)
Lpm to check INR 

## 2022-03-30 ENCOUNTER — Ambulatory Visit (INDEPENDENT_AMBULATORY_CARE_PROVIDER_SITE_OTHER): Payer: 59 | Admitting: Cardiovascular Disease

## 2022-03-30 DIAGNOSIS — Z954 Presence of other heart-valve replacement: Secondary | ICD-10-CM | POA: Diagnosis not present

## 2022-03-30 DIAGNOSIS — Z5181 Encounter for therapeutic drug level monitoring: Secondary | ICD-10-CM

## 2022-03-30 LAB — POCT INR: INR: 3.5 — AB (ref 2.0–3.0)

## 2022-04-06 ENCOUNTER — Encounter (INDEPENDENT_AMBULATORY_CARE_PROVIDER_SITE_OTHER): Payer: Self-pay

## 2022-04-13 ENCOUNTER — Telehealth: Payer: Self-pay

## 2022-04-13 NOTE — Telephone Encounter (Signed)
Reminded patient to check INR.  Verbalized understanding 

## 2022-04-16 ENCOUNTER — Telehealth: Payer: Self-pay

## 2022-04-16 NOTE — Telephone Encounter (Signed)
Tried calling patient to check INR.  Mailbox full. 

## 2022-04-24 LAB — POCT INR: INR: 1.5 — AB (ref 2.0–3.0)

## 2022-04-25 ENCOUNTER — Ambulatory Visit (INDEPENDENT_AMBULATORY_CARE_PROVIDER_SITE_OTHER): Payer: 59

## 2022-04-25 DIAGNOSIS — Z954 Presence of other heart-valve replacement: Secondary | ICD-10-CM | POA: Diagnosis not present

## 2022-04-25 DIAGNOSIS — Z5181 Encounter for therapeutic drug level monitoring: Secondary | ICD-10-CM

## 2022-04-25 NOTE — Patient Instructions (Signed)
Description   Take 2 tablets today and then continue taking warfarin 1 tablet daily except for 1/2 a tablet on Sunday and Fridays.  Recheck INR  1 week *Self Tester*     Coumadin Clinic [phone].

## 2022-04-30 ENCOUNTER — Ambulatory Visit
Admission: RE | Admit: 2022-04-30 | Discharge: 2022-04-30 | Disposition: A | Discharge status: Home / Self Care | Payer: 59 | Source: Ambulatory Visit | Attending: Otolaryngology | Admitting: Otolaryngology

## 2022-04-30 DIAGNOSIS — R221 Localized swelling, mass and lump, neck: Secondary | ICD-10-CM

## 2022-05-02 ENCOUNTER — Telehealth: Payer: Self-pay

## 2022-05-02 ENCOUNTER — Other Ambulatory Visit: Payer: Self-pay | Admitting: Otolaryngology

## 2022-05-02 NOTE — Telephone Encounter (Signed)
Received call back from Dr. Anders Simmonds office stating pt did NOT need to hold Wafarin for upcoming procedure. Called pt and made him aware.

## 2022-05-02 NOTE — Telephone Encounter (Signed)
Received message from pt stated he was having a procedure on Monday, 05/07/22 and wanted to confirm if he should hold his Warfarin or start Lovenox.  No clearance found for this procedure. Called Dr [employer] office and spoke with [facility] who took my message and stated she would speak to Dr Marene Lenz and get back with me as soon as possible. Provided her with my direct number [phone]).

## 2022-05-02 NOTE — Telephone Encounter (Signed)
Received message from Micah Flesher, [place] in Pre-op stating -  " He's essentially just getting an ear tube - I don't think he would need to hold coumadin. I also didn't see a clearance request. If they didn't tell him and didn't ask [place], I think its safe to assume he can just continue his home regimen"

## 2022-05-03 ENCOUNTER — Other Ambulatory Visit: Payer: Self-pay

## 2022-05-03 ENCOUNTER — Encounter (HOSPITAL_COMMUNITY): Payer: Self-pay | Admitting: Otolaryngology

## 2022-05-03 NOTE — Progress Notes (Signed)
PCP - Courtney Paris, NP Cardiologist - Runell Gess, MD EKG - 02/14/22 Chest x-ray -  ECHO - 01/22/22 Cardiac Cath -  CPAP -   Blood Thinner Instructions: see note below Aspirin Instructions:  ERAS Protcol - 0430 COVID TEST- n/a  Anesthesia review: yes  -------------  SDW INSTRUCTIONS:  Your procedure is scheduled on Monday 6/26. Please report to [facility] Main Entrance "A" at 0530 A.M., and check in at the Admitting office. Call this number if you have problems the morning of surgery: [phone]   Remember: Do not eat after midnight the night before your surgery  You may drink clear liquids until 04:30 AM the morning of your surgery.   Clear liquids allowed are: Water, Non-Citrus Juices (without pulp), Carbonated Beverages, Clear Tea, Black Coffee Only, and Gatorade   Medications to take morning of surgery with a sip of water include: celecoxib (CELEBREX) cephALEXin (KEFLEX)  clonazePAM (KLONOPIN)  gabapentin (NEURONTIN)  metoprolol tartrate (LOPRESSOR) ondansetron (ZOFRAN) oxyCODONE (ROXICODONE)  predniSONE (DELTASONE)  tiZANidine (ZANAFLEX)   Follow your surgeon's instructions on when to stop warfarin (COUMADIN).  If no instructions were given by your surgeon then you will need to call the office to get those instructions.    Dr. Allyson Sabal and Dr. Marene Lenz aware pt instructed to continue Coumadin as ordered. Pt called 05/02/22 to clarify, per Mds to continue for surgery. Pt instructed to follow MD instructions.   As of today, STOP taking any Aspirin (unless otherwise instructed by your surgeon), Aleve, Naproxen, Ibuprofen, Motrin, Advil, Goody's, BC's, all herbal medications, fish oil, and all vitamins.    The Morning of Surgery Do not wear jewelry Do not wear lotions, powders,colognes, or deodorant Do not bring valuables to the hospital. [facility] is not responsible for any belongings or valuables.  If you are a smoker, DO NOT Smoke 24 hours prior to  surgery  If you wear a CPAP at night please bring your mask the morning of surgery   Remember that you must have someone to transport you home after your surgery, and remain with you for 24 hours if you are discharged the same day.  Please bring cases for contacts, glasses, hearing aids, dentures or bridgework because it cannot be worn into surgery.   Patients discharged the day of surgery will not be allowed to drive home.   Please shower the NIGHT BEFORE/MORNING OF SURGERY (use antibacterial soap like DIAL soap if possible). Wear comfortable clothes the morning of surgery. Oral Hygiene is also important to reduce your risk of infection.  Remember - BRUSH YOUR TEETH THE MORNING OF SURGERY WITH YOUR REGULAR TOOTHPASTE  Patient denies shortness of breath, fever, cough and chest pain.

## 2022-05-04 NOTE — Anesthesia Preprocedure Evaluation (Addendum)
Anesthesia Evaluation  Patient identified by MRN, date of birth, ID band Patient awake    Reviewed: Allergy & Precautions, NPO status , Patient's Chart, lab work & pertinent test results  Airway Mallampati: III  TM Distance: >3 FB Neck ROM: Full    Dental no notable dental hx. (+) Chipped, Dental Advisory Given,    Pulmonary sleep apnea (per pt sleep study was negative) ,    Pulmonary exam normal breath sounds clear to auscultation       Cardiovascular pulmonary hypertension (none on recent echo)+CHF (hx diastolic HF, normal LVEF)  + dysrhythmias (coumadin- did not stop for this procedure) Atrial Fibrillation + Valvular Problems/Murmurs (s/p MVR 2020, mild AI) AI  Rhythm:Irregular Rate:Normal  Most recent 01/22/2022 that showed EF 55 to 60%, severe hypokinesis of the LV, entire inferior wall, normal RV size and RVEF, normal pulmonary artery systolic pressure, trivial MR, mechanical mitral valve present in the mitral valve location with normal function mild AI.    Neuro/Psych PSYCHIATRIC DISORDERS Anxiety Depression negative neurological ROS     GI/Hepatic GERD  Controlled,(+)     substance abuse (marijuana about every 3rd day)  marijuana use,   Endo/Other  negative endocrine ROS  Renal/GU negative Renal ROS     Musculoskeletal  (+) Arthritis , Osteoarthritis,  Chronic back pain   Abdominal   Peds  Hematology negative hematology ROS (+)   Anesthesia Other Findings   Reproductive/Obstetrics negative OB ROS                            Anesthesia Physical Anesthesia Plan  ASA: 3  Anesthesia Plan: General   Post-op Pain Management: Tylenol PO (pre-op)*   Induction: Intravenous  PONV Risk Score and Plan: 3 and Ondansetron, Dexamethasone, Midazolam and Treatment may vary due to age or medical condition  Airway Management Planned: LMA  Additional Equipment: None  Intra-op Plan:    Post-operative Plan: Extubation in OR  Informed Consent: I have reviewed the patients History and Physical, chart, labs and discussed the procedure including the risks, benefits and alternatives for the proposed anesthesia with the patient or authorized representative who has indicated his/her understanding and acceptance.     Dental advisory given  Plan Discussed with: CRNA  Anesthesia Plan Comments: (  )      Anesthesia Quick Evaluation

## 2022-05-07 ENCOUNTER — Ambulatory Visit (HOSPITAL_COMMUNITY)
Admission: RE | Admit: 2022-05-07 | Discharge: 2022-05-07 | Disposition: A | Discharge status: Home / Self Care | Payer: 59 | Attending: Otolaryngology | Admitting: Otolaryngology

## 2022-05-07 ENCOUNTER — Ambulatory Visit (HOSPITAL_COMMUNITY): Payer: 59 | Admitting: Physician Assistant

## 2022-05-07 ENCOUNTER — Encounter (HOSPITAL_COMMUNITY)
Admission: RE | Disposition: A | Discharge status: Home / Self Care | Payer: Self-pay | Source: Home / Self Care | Attending: Otolaryngology

## 2022-05-07 ENCOUNTER — Ambulatory Visit (HOSPITAL_BASED_OUTPATIENT_CLINIC_OR_DEPARTMENT_OTHER): Payer: 59 | Admitting: Physician Assistant

## 2022-05-07 ENCOUNTER — Encounter (HOSPITAL_COMMUNITY): Payer: Self-pay | Admitting: Otolaryngology

## 2022-05-07 ENCOUNTER — Other Ambulatory Visit: Payer: Self-pay

## 2022-05-07 DIAGNOSIS — M1990 Unspecified osteoarthritis, unspecified site: Secondary | ICD-10-CM | POA: Diagnosis not present

## 2022-05-07 DIAGNOSIS — H66005 Acute suppurative otitis media without spontaneous rupture of ear drum, recurrent, left ear: Secondary | ICD-10-CM | POA: Diagnosis present

## 2022-05-07 DIAGNOSIS — F17220 Nicotine dependence, chewing tobacco, uncomplicated: Secondary | ICD-10-CM | POA: Insufficient documentation

## 2022-05-07 DIAGNOSIS — F1290 Cannabis use, unspecified, uncomplicated: Secondary | ICD-10-CM | POA: Diagnosis not present

## 2022-05-07 DIAGNOSIS — I5032 Chronic diastolic (congestive) heart failure: Secondary | ICD-10-CM | POA: Diagnosis not present

## 2022-05-07 DIAGNOSIS — H66002 Acute suppurative otitis media without spontaneous rupture of ear drum, left ear: Secondary | ICD-10-CM

## 2022-05-07 DIAGNOSIS — Z96642 Presence of left artificial hip joint: Secondary | ICD-10-CM | POA: Diagnosis not present

## 2022-05-07 DIAGNOSIS — F418 Other specified anxiety disorders: Secondary | ICD-10-CM | POA: Diagnosis not present

## 2022-05-07 DIAGNOSIS — Z952 Presence of prosthetic heart valve: Secondary | ICD-10-CM | POA: Diagnosis not present

## 2022-05-07 DIAGNOSIS — I509 Heart failure, unspecified: Secondary | ICD-10-CM

## 2022-05-07 DIAGNOSIS — I272 Pulmonary hypertension, unspecified: Secondary | ICD-10-CM | POA: Diagnosis not present

## 2022-05-07 HISTORY — PX: MYRINGOTOMY WITH TUBE PLACEMENT: SHX5663

## 2022-05-07 LAB — BASIC METABOLIC PANEL
Anion gap: 7 (ref 5–15)
BUN: 13 mg/dL (ref 6–20)
CO2: 24 mmol/L (ref 22–32)
Calcium: 8.2 mg/dL — ABNORMAL LOW (ref 8.9–10.3)
Chloride: 106 mmol/L (ref 98–111)
Creatinine, Ser: 1.03 mg/dL (ref 0.61–1.24)
GFR, Estimated: >60 mL/min (ref >60)
Glucose, Bld: 129 mg/dL — ABNORMAL HIGH (ref 70–99)
Potassium: 4.2 mmol/L (ref 3.5–5.1)
Sodium: 137 mmol/L (ref 135–145)

## 2022-05-07 LAB — PROTIME-INR
INR: 4.3 (ref 0.8–1.2)
Prothrombin Time: 41.1 seconds — ABNORMAL HIGH (ref 11.4–15.2)

## 2022-05-07 LAB — CBC
HCT: 43.3 % (ref 39.0–52.0)
Hemoglobin: 14 g/dL (ref 13.0–17.0)
MCH: 28.9 pg (ref 26.0–34.0)
MCHC: 32.3 g/dL (ref 30.0–36.0)
MCV: 89.5 fL (ref 80.0–100.0)
Platelets: 154 10*3/uL (ref 150–400)
RBC: 4.84 MIL/uL (ref 4.22–5.81)
RDW: 14 % (ref 11.5–15.5)
WBC: 10.2 10*3/uL (ref 4.0–10.5)
nRBC: 0 % (ref 0.0–0.2)

## 2022-05-07 SURGERY — MYRINGOTOMY WITH TUBE PLACEMENT
Anesthesia: General | Site: Ear | Laterality: Left

## 2022-05-07 MED ORDER — ACETAMINOPHEN 500 MG PO TABS
1000.0000 mg | ORAL_TABLET | Freq: Once | ORAL | Status: DC
  Filled 2022-05-07: qty 2

## 2022-05-07 MED ORDER — MEPERIDINE HCL 25 MG/ML IJ SOLN: 6.2500 mg | INTRAMUSCULAR | Status: DC | PRN

## 2022-05-07 MED ORDER — HYDROMORPHONE HCL 1 MG/ML IJ SOLN: 0.2500 mg | INTRAMUSCULAR | Status: DC | PRN

## 2022-05-07 MED ORDER — OXYCODONE HCL 5 MG/5ML PO SOLN: 5.0000 mg | Freq: Once | ORAL | Status: DC | PRN

## 2022-05-07 MED ORDER — KETOROLAC TROMETHAMINE 30 MG/ML IJ SOLN: 30.0000 mg | Freq: Once | INTRAMUSCULAR | Status: DC | PRN

## 2022-05-07 MED ORDER — CHLORHEXIDINE GLUCONATE 0.12 % MT SOLN
15.0000 mL | Freq: Once | OROMUCOSAL | Status: AC
  Administered 2022-05-07: 15 mL via OROMUCOSAL

## 2022-05-07 MED ORDER — ORAL CARE MOUTH RINSE: 15.0000 mL | Freq: Once | OROMUCOSAL | Status: AC

## 2022-05-07 MED ORDER — OXYMETAZOLINE HCL 0.05 % NA SOLN
NASAL | Status: AC
Start: 2022-05-07 — End: ?
  Filled 2022-05-07: qty 30

## 2022-05-07 MED ORDER — AMISULPRIDE (ANTIEMETIC) 5 MG/2ML IV SOLN
10.0000 mg | Freq: Once | INTRAVENOUS | Status: DC | PRN
Start: 2022-05-07 — End: 2022-05-07

## 2022-05-07 MED ORDER — MIDAZOLAM HCL 2 MG/2ML IJ SOLN
INTRAMUSCULAR | Status: AC
Start: 2022-05-07 — End: ?
  Filled 2022-05-07: qty 2

## 2022-05-07 MED ORDER — ONDANSETRON HCL 4 MG/2ML IJ SOLN
4.0000 mg | Freq: Once | INTRAMUSCULAR | Status: DC | PRN
Start: 2022-05-07 — End: 2022-05-07

## 2022-05-07 MED ORDER — PROPOFOL 10 MG/ML IV BOLUS
INTRAVENOUS | Status: AC
  Filled 2022-05-07: qty 20

## 2022-05-07 MED ORDER — LACTATED RINGERS IV SOLN: INTRAVENOUS | Status: DC

## 2022-05-07 MED ORDER — PROPOFOL 10 MG/ML IV BOLUS
INTRAVENOUS | Status: DC | PRN
  Administered 2022-05-07: 150 mg via INTRAVENOUS

## 2022-05-07 MED ORDER — FENTANYL CITRATE (PF) 250 MCG/5ML IJ SOLN
INTRAMUSCULAR | Status: DC | PRN
  Administered 2022-05-07: 50 ug via INTRAVENOUS

## 2022-05-07 MED ORDER — LIDOCAINE 2% (20 MG/ML) 5 ML SYRINGE
INTRAMUSCULAR | Status: DC | PRN
  Administered 2022-05-07: 60 mg via INTRAVENOUS

## 2022-05-07 MED ORDER — FENTANYL CITRATE (PF) 250 MCG/5ML IJ SOLN
INTRAMUSCULAR | Status: AC
  Filled 2022-05-07: qty 5

## 2022-05-07 MED ORDER — ONDANSETRON HCL 4 MG/2ML IJ SOLN
INTRAMUSCULAR | Status: DC | PRN
  Administered 2022-05-07: 4 mg via INTRAVENOUS

## 2022-05-07 MED ORDER — DEXAMETHASONE SODIUM PHOSPHATE 10 MG/ML IJ SOLN
INTRAMUSCULAR | Status: DC | PRN
  Administered 2022-05-07: 10 mg via INTRAVENOUS

## 2022-05-07 MED ORDER — MIDAZOLAM HCL 2 MG/2ML IJ SOLN
INTRAMUSCULAR | Status: DC | PRN
  Administered 2022-05-07: 2 mg via INTRAVENOUS

## 2022-05-07 MED ORDER — KETOROLAC TROMETHAMINE 30 MG/ML IJ SOLN
INTRAMUSCULAR | Status: AC
  Filled 2022-05-07: qty 1

## 2022-05-07 MED ORDER — ACETAMINOPHEN 10 MG/ML IV SOLN
INTRAVENOUS | Status: AC
  Filled 2022-05-07: qty 100

## 2022-05-07 MED ORDER — CIPROFLOXACIN-DEXAMETHASONE 0.3-0.1 % OT SUSP
OTIC | Status: AC
  Filled 2022-05-07: qty 7.5

## 2022-05-07 MED ORDER — OXYCODONE HCL 5 MG PO TABS: 5.0000 mg | ORAL_TABLET | Freq: Once | ORAL | Status: DC | PRN

## 2022-05-07 MED ORDER — CIPROFLOXACIN-DEXAMETHASONE 0.3-0.1 % OT SUSP
OTIC | Status: DC | PRN
  Administered 2022-05-07: 4 [drp] via OTIC

## 2022-05-07 SURGICAL SUPPLY — 12 items
BLADE MYRINGOTOMY 6 SPEAR HDL (BLADE) ×2 IMPLANT
CANISTER SUCT 3000ML PPV (MISCELLANEOUS) ×2 IMPLANT
COTTONBALL LRG STERILE PKG (GAUZE/BANDAGES/DRESSINGS) ×2 IMPLANT
COVER MAYO STAND STRL (DRAPES) ×2 IMPLANT
DRAPE HALF SHEET 40X57 (DRAPES) ×2 IMPLANT
KIT TURNOVER KIT B (KITS) ×2 IMPLANT
NS IRRIG 1000ML POUR BTL (IV SOLUTION) ×2 IMPLANT
PAD ARMBOARD 7.5X6 YLW CONV (MISCELLANEOUS) ×2 IMPLANT
POSITIONER HEAD DONUT 9IN (MISCELLANEOUS) IMPLANT
TOWEL GREEN STERILE FF (TOWEL DISPOSABLE) ×2 IMPLANT
TUBE CONNECTING 12X1/4 (SUCTIONS) ×2 IMPLANT
TUBE EAR T MOD 1.32X4.8 BL (OTOLOGIC RELATED) ×1 IMPLANT

## 2022-05-07 NOTE — Anesthesia Procedure Notes (Signed)
Procedure Name: LMA Insertion Date/Time: 05/07/2022 7:44 AM  Performed by: Drema Pry, CRNAPre-anesthesia Checklist: Patient identified, Emergency Drugs available, Suction available and Patient being monitored Patient Re-evaluated:Patient Re-evaluated prior to induction Oxygen Delivery Method: Circle System Utilized Preoxygenation: Pre-oxygenation with 100% oxygen Induction Type: IV induction Ventilation: Mask ventilation without difficulty LMA: LMA inserted LMA Size: 4.0 Number of attempts: 1 Placement Confirmation: positive ETCO2 Tube secured with: Tape Dental Injury: Teeth and Oropharynx as per pre-operative assessment

## 2022-05-07 NOTE — Progress Notes (Signed)
Notified Dr. Darl Pikes of pt's INR 4.3.  She will be here to see pt and discuss whether or not to proceed with case.

## 2022-05-07 NOTE — Op Note (Addendum)
OPERATIVE NOTE  CATHAN GASCOIGNE Date/Time of Admission: 05/07/2022  5:12 AM  CSN: [id number] Attending Provider: Cheron Schaumann A, DO Room/Bed: MCPO/NONE DOB: [birth year] Age: 36 y.o.   Pre-Op Diagnosis: Recurrent acute suppurative otitis media without spontaneous rupture of left tympanic membrane  Post-Op Diagnosis: Recurrent acute suppurative otitis media without spontaneous rupture of left tympanic membrane  Procedure: Procedure(s): MYRINGOTOMY WITH TUBE PLACEMENT, LEFT  Anesthesia: General  Surgeon(s): Spring San A Malaya Cagley, DO  Staff: Circulator: Pietro Cassis, RN Scrub Person: Josefa Half, RN  Implants: * No implants in log *  Specimens: * No specimens in log *  Complications: None  EBL: <1 ML  Condition: stable  Operative Findings:  Retracted atelectatic left tympanic membrane with serous effusion  Description of Operation: Once operative consent was obtained, and the surgical site confirmed with the operating room team, the patient was brought back to the operating room and mask inhalational anesthesia was obtained. The patient was turned over to the ENT service. An operating microscope was used to visualize the left external auditory canal and tympanic membrane. Ceratectomy was performed. A radial incision was made in the anterior inferior quadrant of the tympanic membrane. Middle ear contents were suctioned and a Richards modified T pressure equalization tube was placed through the incision. Ciprodex drops were placed in the ear. The patient was turned back over to the anesthesia service. The patient was then transferred to the PACU in stable condition.   Laren Boom, DO [facility]  05/07/2022

## 2022-05-07 NOTE — Anesthesia Postprocedure Evaluation (Signed)
Anesthesia Post Note  Patient: Darren Haynes  Procedure(s) Performed: MYRINGOTOMY WITH TUBE PLACEMENT (Left: Ear)     Patient location during evaluation: PACU Anesthesia Type: General Level of consciousness: awake and alert, oriented and patient cooperative Pain management: pain level controlled Vital Signs Assessment: post-procedure vital signs reviewed and stable Respiratory status: spontaneous breathing, nonlabored ventilation and respiratory function stable Cardiovascular status: blood pressure returned to baseline and stable Postop Assessment: no apparent nausea or vomiting Anesthetic complications: no   No notable events documented.  Last Vitals:  Vitals:   05/07/22 0830 05/07/22 0833  BP:  114/85  Pulse: 66 62  Resp: 20 16  Temp:  36.5 C  SpO2: 98% 98%    Last Pain:  Vitals:   05/07/22 0833  TempSrc:   PainSc: 0-No pain                 Lannie Fields

## 2022-05-08 ENCOUNTER — Encounter (HOSPITAL_COMMUNITY): Payer: Self-pay | Admitting: Otolaryngology

## 2022-05-09 ENCOUNTER — Other Ambulatory Visit: Payer: Self-pay | Admitting: Otolaryngology

## 2022-05-09 ENCOUNTER — Ambulatory Visit: Payer: BC Managed Care – PPO | Admitting: Cardiovascular Disease

## 2022-05-09 DIAGNOSIS — E041 Nontoxic single thyroid nodule: Secondary | ICD-10-CM

## 2022-05-14 ENCOUNTER — Encounter: Payer: Self-pay | Admitting: Cardiovascular Disease

## 2022-05-17 ENCOUNTER — Ambulatory Visit
Admission: RE | Admit: 2022-05-17 | Discharge: 2022-05-17 | Disposition: A | Discharge status: Home / Self Care | Payer: 59 | Source: Ambulatory Visit | Attending: Otolaryngology | Admitting: Otolaryngology

## 2022-05-17 ENCOUNTER — Other Ambulatory Visit (HOSPITAL_COMMUNITY)
Admission: RE | Admit: 2022-05-17 | Discharge: 2022-05-17 | Disposition: A | Discharge status: Home / Self Care | Payer: 59 | Source: Ambulatory Visit | Attending: Otolaryngology | Admitting: Otolaryngology

## 2022-05-17 DIAGNOSIS — E041 Nontoxic single thyroid nodule: Secondary | ICD-10-CM

## 2022-05-18 ENCOUNTER — Ambulatory Visit (INDEPENDENT_AMBULATORY_CARE_PROVIDER_SITE_OTHER): Payer: 59 | Admitting: *Deleted

## 2022-05-18 DIAGNOSIS — Z954 Presence of other heart-valve replacement: Secondary | ICD-10-CM | POA: Diagnosis not present

## 2022-05-18 DIAGNOSIS — Z5181 Encounter for therapeutic drug level monitoring: Secondary | ICD-10-CM | POA: Diagnosis not present

## 2022-05-18 LAB — POCT INR: INR: 3.4 — AB (ref 2.0–3.0)

## 2022-05-18 LAB — CYTOLOGY - NON PAP

## 2022-05-18 NOTE — Patient Instructions (Signed)
Description   Continue taking warfarin 1 tablet daily except for 1/2 a tablet on Sunday and Fridays. Please call for any upcoming procedures/  Recheck INR 2 week *Self Tester*     Coumadin Clinic [phone].

## 2022-05-21 ENCOUNTER — Telehealth (HOSPITAL_COMMUNITY): Payer: Self-pay | Admitting: Student

## 2022-05-21 NOTE — Telephone Encounter (Signed)
Patient was seen at the [facility] Radiology outpatient clinic for a right inferior thyroid nodule biopsy 05/17/22. He was evaluated by Dr. Milford Cage and due to how inferior/deep the nodule is, Dr. Milford Cage believed the nodule to possibly be a parathyroid nodule rather than a thyroid nodule. The biopsy of this nodule was performed by Dr. Milford Cage and the pathology report stated the sample was a [place] 1/non-diagnostic sample. Dr. Milford Cage recommends US thyroid follow up in one year. This recommendation was relayed to [facility] at Dr. Anders Simmonds office.   Alwyn Ren, [place] [phone] 05/21/2022, 8:50 AM

## 2022-05-28 ENCOUNTER — Telehealth: Payer: Self-pay | Admitting: Cardiovascular Disease

## 2022-05-28 NOTE — Telephone Encounter (Signed)
Called pt and LVMTCB, regarding getting return mail for no show letter.

## 2022-06-06 ENCOUNTER — Other Ambulatory Visit: Payer: Self-pay | Admitting: Cardiovascular Disease

## 2022-06-06 ENCOUNTER — Ambulatory Visit (INDEPENDENT_AMBULATORY_CARE_PROVIDER_SITE_OTHER): Payer: 59 | Admitting: Internal Medicine

## 2022-06-06 DIAGNOSIS — Z5181 Encounter for therapeutic drug level monitoring: Secondary | ICD-10-CM

## 2022-06-06 DIAGNOSIS — Z954 Presence of other heart-valve replacement: Secondary | ICD-10-CM

## 2022-06-06 LAB — POCT INR: INR: 3.3 — AB (ref 2.0–3.0)

## 2022-06-06 NOTE — Patient Instructions (Signed)
Description   Called and spoke to pt and instructed him to continue taking warfarin 1 tablet daily except for 1/2 a tablet on Sunday and Fridays. Please call for any upcoming procedures/  Recheck INR 2 week *Self Tester*     Coumadin Clinic [phone].

## 2022-06-06 NOTE — Telephone Encounter (Signed)
Prescription refill request received for warfarin Lov:  hoa 03/16/2022 Next INR check: 7/25- Pt missed last appointment. Pt waiting on test strips.  Warfarin tablet strength: 5mg 

## 2022-06-20 ENCOUNTER — Ambulatory Visit (INDEPENDENT_AMBULATORY_CARE_PROVIDER_SITE_OTHER): Payer: 59 | Admitting: *Deleted

## 2022-06-20 DIAGNOSIS — Z954 Presence of other heart-valve replacement: Secondary | ICD-10-CM

## 2022-06-20 DIAGNOSIS — I050 Rheumatic mitral stenosis: Secondary | ICD-10-CM

## 2022-06-20 LAB — POCT INR: INR: 3.2 — AB (ref 2.0–3.0)

## 2022-06-20 NOTE — Patient Instructions (Signed)
Description   Called and spoke to pt and instructed him to continue taking the dose he has been taking, which is, warfarin 1 tablet daily. Please call for any upcoming procedures Recheck INR 2 weeks. *Self Tester*   Coumadin Clinic [phone].         

## 2022-06-30 LAB — POCT INR: INR: 3.6 — AB (ref 2.0–3.0)

## 2022-07-04 ENCOUNTER — Ambulatory Visit (INDEPENDENT_AMBULATORY_CARE_PROVIDER_SITE_OTHER): Payer: 59

## 2022-07-04 DIAGNOSIS — Z5181 Encounter for therapeutic drug level monitoring: Secondary | ICD-10-CM | POA: Diagnosis not present

## 2022-07-04 NOTE — Patient Instructions (Signed)
Description   Called and spoke to pt and instructed him to continue taking the dose he has been taking, which is, warfarin 1 tablet daily. Please call for any upcoming procedures Recheck INR 2 weeks. *Self Tester*   Coumadin Clinic [phone].         

## 2022-07-13 ENCOUNTER — Ambulatory Visit (INDEPENDENT_AMBULATORY_CARE_PROVIDER_SITE_OTHER): Payer: 59

## 2022-07-13 DIAGNOSIS — Z5181 Encounter for therapeutic drug level monitoring: Secondary | ICD-10-CM

## 2022-07-13 DIAGNOSIS — Z954 Presence of other heart-valve replacement: Secondary | ICD-10-CM | POA: Diagnosis not present

## 2022-07-13 LAB — POCT INR: INR: 2.3 (ref 2.0–3.0)

## 2022-07-13 NOTE — Patient Instructions (Signed)
Description   Called and spoke to pt and instructed him to take 1.5 tablets today and then continue taking the dose he has been taking, which is, warfarin 1 tablet daily. Please call for any upcoming procedures/  Recheck INR 2 weeks. *Self Tester*   Coumadin Clinic [phone].

## 2022-07-27 ENCOUNTER — Ambulatory Visit (INDEPENDENT_AMBULATORY_CARE_PROVIDER_SITE_OTHER): Payer: 59

## 2022-07-27 DIAGNOSIS — Z5181 Encounter for therapeutic drug level monitoring: Secondary | ICD-10-CM | POA: Diagnosis not present

## 2022-07-27 LAB — POCT INR: INR: 3.3 — AB (ref 2.0–3.0)

## 2022-07-27 NOTE — Patient Instructions (Signed)
Description   Called and spoke to pt and instructed him to continue taking the dose he has been taking, which is, warfarin 1 tablet daily. Please call for any upcoming procedures Recheck INR 2 weeks. *Self Tester*   Coumadin Clinic [phone].         

## 2022-08-10 ENCOUNTER — Telehealth: Payer: Self-pay

## 2022-08-10 NOTE — Telephone Encounter (Signed)
Reminded patient to check INR.  Verbalized understanding 

## 2022-08-12 LAB — POCT INR: INR: 3.2 — AB (ref 2.0–3.0)

## 2022-08-13 ENCOUNTER — Ambulatory Visit (INDEPENDENT_AMBULATORY_CARE_PROVIDER_SITE_OTHER): Payer: 59

## 2022-08-13 DIAGNOSIS — Z954 Presence of other heart-valve replacement: Secondary | ICD-10-CM

## 2022-08-13 DIAGNOSIS — Z5181 Encounter for therapeutic drug level monitoring: Secondary | ICD-10-CM | POA: Diagnosis not present

## 2022-08-13 NOTE — Patient Instructions (Signed)
Description   Called and spoke to pt and instructed him to continue taking the dose he has been taking, which is, warfarin 1 tablet daily. Please call for any upcoming procedures Recheck INR 2 weeks. *Self Tester*   Coumadin Clinic [phone].

## 2022-08-27 ENCOUNTER — Telehealth: Payer: Self-pay

## 2022-08-27 NOTE — Telephone Encounter (Signed)
Reminded pt to check INR, he will do tonight 10/16.

## 2022-08-28 ENCOUNTER — Ambulatory Visit (INDEPENDENT_AMBULATORY_CARE_PROVIDER_SITE_OTHER): Payer: 59 | Admitting: *Deleted

## 2022-08-28 DIAGNOSIS — I050 Rheumatic mitral stenosis: Secondary | ICD-10-CM

## 2022-08-28 DIAGNOSIS — Z954 Presence of other heart-valve replacement: Secondary | ICD-10-CM | POA: Diagnosis not present

## 2022-08-28 LAB — POCT INR: INR: 2.5 (ref 2.0–3.0)

## 2022-09-10 ENCOUNTER — Other Ambulatory Visit: Payer: Self-pay | Admitting: Cardiovascular Disease

## 2022-09-10 DIAGNOSIS — Z954 Presence of other heart-valve replacement: Secondary | ICD-10-CM

## 2022-09-11 LAB — POCT INR: INR: 3.2 — AB (ref 2.0–3.0)

## 2022-09-12 ENCOUNTER — Ambulatory Visit (INDEPENDENT_AMBULATORY_CARE_PROVIDER_SITE_OTHER): Payer: 59

## 2022-09-12 DIAGNOSIS — Z5181 Encounter for therapeutic drug level monitoring: Secondary | ICD-10-CM

## 2022-09-12 DIAGNOSIS — Z954 Presence of other heart-valve replacement: Secondary | ICD-10-CM

## 2022-09-12 NOTE — Patient Instructions (Signed)
Description   Called and spoke to pt and instructed him to continue taking taking warfarin 1 tablet daily. Please call for any upcoming procedures. Recheck INR 2 weeks. *Self Tester*   Coumadin Clinic [phone].         

## 2022-09-25 ENCOUNTER — Telehealth: Payer: Self-pay

## 2022-09-25 NOTE — Telephone Encounter (Signed)
Reminded patient to check INR.  Verbalized understanding 

## 2022-09-27 ENCOUNTER — Ambulatory Visit (INDEPENDENT_AMBULATORY_CARE_PROVIDER_SITE_OTHER): Payer: 59 | Admitting: *Deleted

## 2022-09-27 DIAGNOSIS — I050 Rheumatic mitral stenosis: Secondary | ICD-10-CM

## 2022-09-27 DIAGNOSIS — Z5181 Encounter for therapeutic drug level monitoring: Secondary | ICD-10-CM | POA: Diagnosis not present

## 2022-09-27 DIAGNOSIS — Z954 Presence of other heart-valve replacement: Secondary | ICD-10-CM

## 2022-09-27 LAB — POCT INR: INR: 3.2 — AB (ref 2.0–3.0)

## 2022-10-12 ENCOUNTER — Telehealth: Payer: Self-pay

## 2022-10-12 NOTE — Telephone Encounter (Signed)
Lpm to check INR 

## 2022-10-16 ENCOUNTER — Ambulatory Visit (INDEPENDENT_AMBULATORY_CARE_PROVIDER_SITE_OTHER): Payer: 59 | Admitting: Cardiology

## 2022-10-16 DIAGNOSIS — Z954 Presence of other heart-valve replacement: Secondary | ICD-10-CM

## 2022-10-16 DIAGNOSIS — Z5181 Encounter for therapeutic drug level monitoring: Secondary | ICD-10-CM

## 2022-10-16 LAB — POCT INR: INR: 2.8 (ref 2.0–3.0)

## 2022-10-31 ENCOUNTER — Ambulatory Visit (INDEPENDENT_AMBULATORY_CARE_PROVIDER_SITE_OTHER): Payer: 59

## 2022-10-31 DIAGNOSIS — Z5181 Encounter for therapeutic drug level monitoring: Secondary | ICD-10-CM

## 2022-10-31 LAB — POCT INR: INR: 3.1 — AB (ref 2.0–3.0)

## 2022-10-31 NOTE — Patient Instructions (Signed)
Description   Called and spoke to pt and instructed him to continue taking taking warfarin 1 tablet daily. Please call for any upcoming procedures. Recheck INR 2 weeks. *Self Tester*   Coumadin Clinic [phone].         

## 2022-11-14 ENCOUNTER — Ambulatory Visit (INDEPENDENT_AMBULATORY_CARE_PROVIDER_SITE_OTHER): Payer: 59

## 2022-11-14 DIAGNOSIS — H9211 Otorrhea, right ear: Secondary | ICD-10-CM | POA: Insufficient documentation

## 2022-11-14 DIAGNOSIS — Z5181 Encounter for therapeutic drug level monitoring: Secondary | ICD-10-CM

## 2022-11-14 DIAGNOSIS — Z9622 Myringotomy tube(s) status: Secondary | ICD-10-CM | POA: Insufficient documentation

## 2022-11-14 LAB — POCT INR: INR: 3.2 — AB (ref 2.0–3.0)

## 2022-11-14 NOTE — Patient Instructions (Signed)
Description   Called and spoke to pt and instructed him to continue taking taking warfarin 1 tablet daily. Please call for any upcoming procedures. Recheck INR 2 weeks. *Self Tester*   Coumadin Clinic [phone].

## 2022-11-15 ENCOUNTER — Other Ambulatory Visit: Payer: Self-pay | Admitting: *Deleted

## 2022-11-15 DIAGNOSIS — Z954 Presence of other heart-valve replacement: Secondary | ICD-10-CM

## 2022-11-15 MED ORDER — WARFARIN SODIUM 5 MG PO TABS: ORAL_TABLET | ORAL | 0 refills | Status: DC

## 2022-11-15 NOTE — Telephone Encounter (Signed)
Last INR yesterday Last OV 03/15/2022

## 2022-11-18 ENCOUNTER — Other Ambulatory Visit: Payer: Self-pay | Admitting: Cardiovascular Disease

## 2022-11-18 DIAGNOSIS — Z954 Presence of other heart-valve replacement: Secondary | ICD-10-CM

## 2022-11-19 NOTE — Telephone Encounter (Signed)
Last INR 11/14/22 Last OV 03/15/22

## 2022-11-26 ENCOUNTER — Ambulatory Visit (INDEPENDENT_AMBULATORY_CARE_PROVIDER_SITE_OTHER): Payer: 59

## 2022-11-26 DIAGNOSIS — Z954 Presence of other heart-valve replacement: Secondary | ICD-10-CM

## 2022-11-26 DIAGNOSIS — Z5181 Encounter for therapeutic drug level monitoring: Secondary | ICD-10-CM

## 2022-11-26 LAB — POCT INR: INR: 3 (ref 2.0–3.0)

## 2022-12-10 ENCOUNTER — Ambulatory Visit (INDEPENDENT_AMBULATORY_CARE_PROVIDER_SITE_OTHER): Payer: 59

## 2022-12-10 DIAGNOSIS — Z5181 Encounter for therapeutic drug level monitoring: Secondary | ICD-10-CM

## 2022-12-10 LAB — POCT INR: INR: 4 — AB (ref 2.0–3.0)

## 2022-12-10 NOTE — Patient Instructions (Signed)
Description   Called and spoke to pt and instructed him to only take 0.5 tablet today and then continue taking taking warfarin 1 tablet daily.  Please call for any upcoming procedures. Recheck INR 2 weeks. *Self Tester*   [facility] [phone].

## 2022-12-21 ENCOUNTER — Ambulatory Visit (INDEPENDENT_AMBULATORY_CARE_PROVIDER_SITE_OTHER): Payer: 59

## 2022-12-21 DIAGNOSIS — Z5181 Encounter for therapeutic drug level monitoring: Secondary | ICD-10-CM | POA: Diagnosis not present

## 2022-12-21 LAB — POCT INR: INR: 2.6 (ref 2.0–3.0)

## 2022-12-21 NOTE — Patient Instructions (Signed)
Description   Called and spoke to pt and instructed him to continue taking warfarin 1 tablet daily.  Please call for any upcoming procedures. Recheck INR 2 weeks. *Self Tester*   [facility] [phone].

## 2023-01-07 ENCOUNTER — Telehealth: Payer: Self-pay

## 2023-01-07 NOTE — Telephone Encounter (Signed)
Pt's INR was due on 01/04/23. Called, no answer. Unable to leave voicemail - voice mailbox full.

## 2023-01-09 LAB — POCT INR: INR: 4.1 — AB (ref 2.0–3.0)

## 2023-01-10 ENCOUNTER — Ambulatory Visit (INDEPENDENT_AMBULATORY_CARE_PROVIDER_SITE_OTHER): Payer: 59

## 2023-01-10 DIAGNOSIS — Z5181 Encounter for therapeutic drug level monitoring: Secondary | ICD-10-CM

## 2023-01-10 NOTE — Patient Instructions (Signed)
Description   Called and spoke to pt. Instructed him to HOLD today's dose and then continue taking warfarin 1 tablet daily.  Please call for any upcoming procedures. Recheck INR 1 week. (normally 2 weeks - *Self Tester*)   [facility] [phone].

## 2023-01-16 ENCOUNTER — Ambulatory Visit (INDEPENDENT_AMBULATORY_CARE_PROVIDER_SITE_OTHER): Payer: 59 | Admitting: *Deleted

## 2023-01-16 DIAGNOSIS — Z954 Presence of other heart-valve replacement: Secondary | ICD-10-CM | POA: Diagnosis not present

## 2023-01-16 DIAGNOSIS — Z5181 Encounter for therapeutic drug level monitoring: Secondary | ICD-10-CM | POA: Diagnosis not present

## 2023-01-16 DIAGNOSIS — I050 Rheumatic mitral stenosis: Secondary | ICD-10-CM

## 2023-01-16 LAB — POCT INR: INR: 2.7 (ref 2.0–3.0)

## 2023-01-30 ENCOUNTER — Ambulatory Visit (INDEPENDENT_AMBULATORY_CARE_PROVIDER_SITE_OTHER): Payer: 59

## 2023-01-30 DIAGNOSIS — Z5181 Encounter for therapeutic drug level monitoring: Secondary | ICD-10-CM | POA: Diagnosis not present

## 2023-01-30 LAB — POCT INR: INR: 2.7 (ref 2.0–3.0)

## 2023-01-30 NOTE — Patient Instructions (Signed)
Description   Called and spoke to pt and instructed him to continue taking warfarin 1 tablet daily. Please call for any upcoming procedures. Recheck INR 2 weeks.  (normally 2 weeks-*Self Tester*)   [facility] [phone].

## 2023-02-11 ENCOUNTER — Other Ambulatory Visit (HOSPITAL_COMMUNITY)
Admission: RE | Admit: 2023-02-11 | Discharge: 2023-02-11 | Disposition: A | Discharge status: Home / Self Care | Payer: 59 | Source: Ambulatory Visit | Attending: Cardiovascular Disease | Admitting: Cardiovascular Disease

## 2023-02-11 ENCOUNTER — Telehealth: Payer: Self-pay

## 2023-02-11 ENCOUNTER — Ambulatory Visit (INDEPENDENT_AMBULATORY_CARE_PROVIDER_SITE_OTHER): Payer: 59 | Admitting: Pharmacist

## 2023-02-11 DIAGNOSIS — I050 Rheumatic mitral stenosis: Secondary | ICD-10-CM | POA: Insufficient documentation

## 2023-02-11 DIAGNOSIS — Z954 Presence of other heart-valve replacement: Secondary | ICD-10-CM | POA: Insufficient documentation

## 2023-02-11 DIAGNOSIS — Z7901 Long term (current) use of anticoagulants: Secondary | ICD-10-CM

## 2023-02-11 LAB — POCT INR: INR: 8 — AB (ref 2.0–3.0)

## 2023-02-11 LAB — PROTIME-INR
INR: 7.2 (ref 0.8–1.2)
Prothrombin Time: 61.1 seconds — ABNORMAL HIGH (ref 11.4–15.2)

## 2023-02-11 NOTE — Telephone Encounter (Signed)
Darren Haynes from [place] lab called with Critical PT/INR level today is 7.2. will forward as urgent to [facility].

## 2023-02-11 NOTE — Telephone Encounter (Signed)
See anticoag encounter from 4/1

## 2023-02-11 NOTE — Progress Notes (Signed)
Patient still not eating much. Stomach doesn't feel right. Going to try to eat more today.

## 2023-02-15 ENCOUNTER — Ambulatory Visit (INDEPENDENT_AMBULATORY_CARE_PROVIDER_SITE_OTHER): Payer: 59

## 2023-02-15 DIAGNOSIS — Z5181 Encounter for therapeutic drug level monitoring: Secondary | ICD-10-CM

## 2023-02-15 LAB — POCT INR: INR: 3.4 — AB (ref 2.0–3.0)

## 2023-02-15 NOTE — Patient Instructions (Signed)
Description   Called and spoke to pt and instructed him resume taking 1 tablet daily.  Please call for any upcoming procedures. Recheck INR 1 weeks.  (normally 2 weeks-*Self Tester*)   [facility] [phone].

## 2023-02-21 ENCOUNTER — Telehealth: Payer: Self-pay | Admitting: *Deleted

## 2023-02-21 LAB — POCT INR: INR: 3.5 — AB (ref 2.0–3.0)

## 2023-02-21 NOTE — Telephone Encounter (Signed)
Called pt since INR monitoring is due; there was no answer therefore left a message to call back. Will await.

## 2023-02-22 ENCOUNTER — Ambulatory Visit (INDEPENDENT_AMBULATORY_CARE_PROVIDER_SITE_OTHER): Payer: 59 | Admitting: *Deleted

## 2023-02-22 DIAGNOSIS — Z954 Presence of other heart-valve replacement: Secondary | ICD-10-CM

## 2023-02-22 DIAGNOSIS — Z5181 Encounter for therapeutic drug level monitoring: Secondary | ICD-10-CM | POA: Diagnosis not present

## 2023-02-22 DIAGNOSIS — I050 Rheumatic mitral stenosis: Secondary | ICD-10-CM

## 2023-02-22 NOTE — Patient Instructions (Signed)
Description   Called and spoke to pt and instructed him to continue taking 1 tablet daily.  Please call for any upcoming procedures. Recheck INR 1 week.  (normally 2 weeks-*Self Tester*)   [facility] [phone].

## 2023-02-28 ENCOUNTER — Telehealth: Payer: Self-pay | Admitting: *Deleted

## 2023-02-28 NOTE — Telephone Encounter (Signed)
Called pt since INR is due. There was no answer therefore, left a message to call back.

## 2023-03-05 ENCOUNTER — Ambulatory Visit (INDEPENDENT_AMBULATORY_CARE_PROVIDER_SITE_OTHER): Payer: 59

## 2023-03-05 DIAGNOSIS — I050 Rheumatic mitral stenosis: Secondary | ICD-10-CM

## 2023-03-05 DIAGNOSIS — Z954 Presence of other heart-valve replacement: Secondary | ICD-10-CM | POA: Diagnosis not present

## 2023-03-05 LAB — POCT INR: INR: 2.4 (ref 2.0–3.0)

## 2023-03-05 NOTE — Patient Instructions (Signed)
Description   Called and spoke to pt and instructed him to take 1.5 tablets today, then resume same dosage of Warfarin 1 tablet daily.  Please call for any upcoming procedures. Recheck INR 2 weeks.  (normally 2 weeks-*Self Tester*)   [facility] [phone].

## 2023-03-14 ENCOUNTER — Other Ambulatory Visit: Payer: Self-pay | Admitting: Cardiovascular Disease

## 2023-03-14 DIAGNOSIS — Z954 Presence of other heart-valve replacement: Secondary | ICD-10-CM

## 2023-03-14 NOTE — Telephone Encounter (Signed)
Prescription refill request received for warfarin Lov: 03/15/2022, needs office visit Next INR check: 5/7 Warfarin tablet strength: 5mg    Pt needs office visit with provider

## 2023-03-20 ENCOUNTER — Ambulatory Visit (INDEPENDENT_AMBULATORY_CARE_PROVIDER_SITE_OTHER): Payer: 59

## 2023-03-20 DIAGNOSIS — Z5181 Encounter for therapeutic drug level monitoring: Secondary | ICD-10-CM | POA: Diagnosis not present

## 2023-03-20 LAB — POCT INR: INR: 3.4 — AB (ref 2.0–3.0)

## 2023-03-20 NOTE — Patient Instructions (Signed)
Description   Pt needs appt with cardiologist Called and spoke to pt and instructed him to continue taking Warfarin 1 tablet daily.  Please call for any upcoming procedures. Recheck INR 2 weeks.  (normally 2 weeks-*Self Tester*)   [facility] [phone].

## 2023-04-03 ENCOUNTER — Telehealth: Payer: Self-pay | Admitting: *Deleted

## 2023-04-03 LAB — POCT INR: INR: 4.3 — AB (ref 2.0–3.0)

## 2023-04-03 NOTE — Telephone Encounter (Signed)
Called pt since INR is due today. There was no answer therefore, left a message.

## 2023-04-04 ENCOUNTER — Ambulatory Visit (INDEPENDENT_AMBULATORY_CARE_PROVIDER_SITE_OTHER): Payer: 59 | Admitting: Cardiology

## 2023-04-04 DIAGNOSIS — Z954 Presence of other heart-valve replacement: Secondary | ICD-10-CM

## 2023-04-04 DIAGNOSIS — Z5181 Encounter for therapeutic drug level monitoring: Secondary | ICD-10-CM | POA: Diagnosis not present

## 2023-04-04 NOTE — Patient Instructions (Addendum)
Description   Pt needs appt with cardiologist Called and spoke to pt and instructed him to continue taking Warfarin 1 tablet daily since he held last night.  Please call for any upcoming procedures. Recheck INR 2 weeks.  (normally 2 weeks-*Self Tester*)   [facility] [phone].

## 2023-04-18 LAB — POCT INR: INR: 3.2 — AB (ref 2.0–3.0)

## 2023-04-19 ENCOUNTER — Ambulatory Visit (INDEPENDENT_AMBULATORY_CARE_PROVIDER_SITE_OTHER): Payer: 59 | Admitting: Internal Medicine

## 2023-04-19 DIAGNOSIS — Z954 Presence of other heart-valve replacement: Secondary | ICD-10-CM | POA: Diagnosis not present

## 2023-04-19 DIAGNOSIS — Z5181 Encounter for therapeutic drug level monitoring: Secondary | ICD-10-CM

## 2023-04-29 ENCOUNTER — Ambulatory Visit (INDEPENDENT_AMBULATORY_CARE_PROVIDER_SITE_OTHER): Payer: 59

## 2023-04-29 DIAGNOSIS — Z5181 Encounter for therapeutic drug level monitoring: Secondary | ICD-10-CM

## 2023-04-29 LAB — POCT INR: INR: 1.2 — AB (ref 2.0–3.0)

## 2023-04-29 NOTE — Patient Instructions (Signed)
Description   Pt needs appt with cardiologist Called and spoke to pt and instructed him to take 2 tablets today and 1.5 tablets tomorrow and then continue taking Warfarin 1 tablet daily Please call for any upcoming procedures. Recheck INR Friday, 05/03/23  (normally 2 weeks-*Self Tester*)   [facility] [phone].

## 2023-05-02 ENCOUNTER — Ambulatory Visit (INDEPENDENT_AMBULATORY_CARE_PROVIDER_SITE_OTHER): Payer: Self-pay | Admitting: *Deleted

## 2023-05-02 DIAGNOSIS — Z954 Presence of other heart-valve replacement: Secondary | ICD-10-CM | POA: Diagnosis not present

## 2023-05-02 DIAGNOSIS — I050 Rheumatic mitral stenosis: Secondary | ICD-10-CM | POA: Diagnosis not present

## 2023-05-02 DIAGNOSIS — Z5181 Encounter for therapeutic drug level monitoring: Secondary | ICD-10-CM

## 2023-05-02 LAB — POCT INR: INR: 2.6 (ref 2.0–3.0)

## 2023-05-02 NOTE — Patient Instructions (Signed)
Description   Pt needs appt with cardiologist Spoke to pt and instructed him to take 1/2 tablet of warfarin tomorrow (prednisone taper 20mg  daily currently) then continue taking Warfarin 1 tablet daily. Please call for any upcoming procedures. Recheck INR Tuesday (after returning from beach). (normally 2 weeks-*Self Tester*)   [facility] [phone].

## 2023-05-07 ENCOUNTER — Telehealth: Payer: Self-pay

## 2023-05-07 LAB — POCT INR: INR: 2.7 (ref 2.0–3.0)

## 2023-05-07 NOTE — Telephone Encounter (Signed)
Tried reaching patient to remind him to check INR, but mailbox full.

## 2023-05-08 ENCOUNTER — Ambulatory Visit (INDEPENDENT_AMBULATORY_CARE_PROVIDER_SITE_OTHER): Payer: 59 | Admitting: Cardiology

## 2023-05-08 DIAGNOSIS — Z5181 Encounter for therapeutic drug level monitoring: Secondary | ICD-10-CM

## 2023-05-08 DIAGNOSIS — Z954 Presence of other heart-valve replacement: Secondary | ICD-10-CM | POA: Diagnosis not present

## 2023-05-09 NOTE — Patient Instructions (Signed)
Description   Pt needs appt with cardiologist Spoke to pt and instructed him to continue taking Warfarin 1 tablet daily. (on Prednisone 20mg, decreasing to 10mg on Friday 6/28)  Please call for any upcoming procedures. Recheck INR in 1 week.  *Self Tester*  [facility] [phone].      

## 2023-05-14 ENCOUNTER — Telehealth: Payer: Self-pay

## 2023-05-14 LAB — POCT INR: INR: 3.2 — AB (ref 2.0–3.0)

## 2023-05-15 ENCOUNTER — Ambulatory Visit (INDEPENDENT_AMBULATORY_CARE_PROVIDER_SITE_OTHER): Payer: 59 | Admitting: Cardiovascular Disease

## 2023-05-15 DIAGNOSIS — Z5181 Encounter for therapeutic drug level monitoring: Secondary | ICD-10-CM

## 2023-05-15 NOTE — Patient Instructions (Signed)
Description   Pt needs appt with cardiologist Spoke to pt and instructed him to continue taking Warfarin 1 tablet daily. (on Prednisone 20mg , decreasing to 10mg  on Friday 6/28)  Please call for any upcoming procedures. Recheck INR in 1 week.  *Self Tester*  [facility] [phone].

## 2023-05-17 NOTE — Telephone Encounter (Signed)
done

## 2023-05-22 ENCOUNTER — Telehealth: Payer: Self-pay | Admitting: *Deleted

## 2023-05-22 ENCOUNTER — Other Ambulatory Visit: Payer: Self-pay | Admitting: Cardiovascular Disease

## 2023-05-22 DIAGNOSIS — Z954 Presence of other heart-valve replacement: Secondary | ICD-10-CM

## 2023-05-22 NOTE — Telephone Encounter (Signed)
Pt due to have INR checked today. Called pt no answer. Voicemail box full.

## 2023-05-23 LAB — POCT INR: INR: 3.2 — AB (ref 2.0–3.0)

## 2023-05-24 ENCOUNTER — Ambulatory Visit (INDEPENDENT_AMBULATORY_CARE_PROVIDER_SITE_OTHER): Payer: 59 | Admitting: Cardiovascular Disease

## 2023-05-24 DIAGNOSIS — Z5181 Encounter for therapeutic drug level monitoring: Secondary | ICD-10-CM

## 2023-05-24 DIAGNOSIS — Z954 Presence of other heart-valve replacement: Secondary | ICD-10-CM

## 2023-05-27 LAB — POCT INR: INR: 8 — AB (ref 2.0–3.0)

## 2023-05-28 ENCOUNTER — Ambulatory Visit (INDEPENDENT_AMBULATORY_CARE_PROVIDER_SITE_OTHER): Payer: 59 | Admitting: Cardiology

## 2023-05-28 DIAGNOSIS — Z5181 Encounter for therapeutic drug level monitoring: Secondary | ICD-10-CM

## 2023-05-28 DIAGNOSIS — Z954 Presence of other heart-valve replacement: Secondary | ICD-10-CM

## 2023-05-31 ENCOUNTER — Telehealth: Payer: Self-pay | Admitting: *Deleted

## 2023-05-31 ENCOUNTER — Ambulatory Visit (INDEPENDENT_AMBULATORY_CARE_PROVIDER_SITE_OTHER): Payer: 59 | Admitting: Cardiology

## 2023-05-31 DIAGNOSIS — Z954 Presence of other heart-valve replacement: Secondary | ICD-10-CM | POA: Diagnosis not present

## 2023-05-31 DIAGNOSIS — Z5181 Encounter for therapeutic drug level monitoring: Secondary | ICD-10-CM | POA: Diagnosis not present

## 2023-05-31 LAB — POCT INR: INR: 1.4 — AB (ref 2.0–3.0)

## 2023-05-31 NOTE — Telephone Encounter (Signed)
Called pt since INR is due today. There was no answer so left a message for him to call back. Will await and follow up.

## 2023-06-05 LAB — POCT INR: INR: 3.3 — AB (ref 2.0–3.0)

## 2023-06-06 ENCOUNTER — Ambulatory Visit (INDEPENDENT_AMBULATORY_CARE_PROVIDER_SITE_OTHER): Payer: 59

## 2023-06-06 DIAGNOSIS — Z5181 Encounter for therapeutic drug level monitoring: Secondary | ICD-10-CM

## 2023-06-06 DIAGNOSIS — Z954 Presence of other heart-valve replacement: Secondary | ICD-10-CM

## 2023-06-06 DIAGNOSIS — I050 Rheumatic mitral stenosis: Secondary | ICD-10-CM

## 2023-06-06 NOTE — Patient Instructions (Signed)
Description   Pt needs appt with cardiologist - transferred to scheduling  Spoke to pt and instructed to continue taking Warfarin 1 tablet daily. Please call for any upcoming procedures. Recheck INR in 1 week *Self Tester*  [facility] [phone].

## 2023-06-14 ENCOUNTER — Telehealth: Payer: Self-pay | Admitting: *Deleted

## 2023-06-14 ENCOUNTER — Telehealth: Payer: Self-pay

## 2023-06-14 ENCOUNTER — Ambulatory Visit (INDEPENDENT_AMBULATORY_CARE_PROVIDER_SITE_OTHER): Payer: 59 | Admitting: *Deleted

## 2023-06-14 DIAGNOSIS — Z954 Presence of other heart-valve replacement: Secondary | ICD-10-CM

## 2023-06-14 DIAGNOSIS — I050 Rheumatic mitral stenosis: Secondary | ICD-10-CM

## 2023-06-14 LAB — POCT INR: INR: 3.2 — AB (ref 2.0–3.0)

## 2023-06-14 NOTE — Patient Instructions (Addendum)
    Description   Pt has appt with cardiologist on 07/07/23. Spoke to pt and instructed to continue taking Warfarin 1 tablet daily. Please call for any upcoming procedures. Recheck INR in 2 weeks. *Self Tester*  [facility] [phone].

## 2023-06-14 NOTE — Telephone Encounter (Signed)
Lp's wife message,  since pt's phone unable to receive call to have pt check INR today.

## 2023-06-14 NOTE — Telephone Encounter (Signed)
Called pt from another number found in the chart. He states he will perform INR when he gets home. Will await and follow up.

## 2023-06-23 LAB — POCT INR: INR: 1.9 — AB (ref 2.0–3.0)

## 2023-06-24 ENCOUNTER — Ambulatory Visit (INDEPENDENT_AMBULATORY_CARE_PROVIDER_SITE_OTHER): Payer: 59 | Admitting: Cardiology

## 2023-06-24 DIAGNOSIS — Z954 Presence of other heart-valve replacement: Secondary | ICD-10-CM

## 2023-06-24 DIAGNOSIS — Z5181 Encounter for therapeutic drug level monitoring: Secondary | ICD-10-CM

## 2023-07-02 NOTE — Progress Notes (Signed)
Cardiology Office Note:  .   Date:  07/10/2023  ID:  Darren Haynes, DOB 03-31-1986, MRN [id number] PCP: [facility], [facility]  [facility] Providers Cardiologist:  Darren Batty, MD Cardiology APP:  Darren Haynes, [place]  History of Present Illness: [name] Kitchen   Darren Haynes is a 37 y.o. 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  Patient is followed by Dr. Allyson Sabal and presents today for an annual follow-up appointment.  Per chart review, patient was first seen by Dr. Allyson Sabal in 11/2018.  Patient was sent for echocardiogram which was completed 11/21/2018 and showed normal LV function, peak mitral valve gradient of 47 mmHg with mean gradient 29 mmHg.  Mitral valve area was 1.5 cm.  Patient complained of severe dyspnea and lower extremity edema.  He underwent TEE on 12/11/2018 that showed EF 55-60%, no regional wall motion abnormalities.  Findings were consistent with severe mitral stenosis with classic findings for rheumatic disease.  He underwent right and left heart catheterization which revealed a significant mitral valve gradient but normal coronary arteries.  Patient was referred to Dr. Renaldo Reel at [facility] who performed successful mitral balloon valvuloplasty in 12/2018 with immediate improvement in his pulmonary artery pressure and his symptoms.  Unfortunately about 2 months later in 02/2019, patient again had progressive dyspnea and lower extremity edema.  He again was sent for TEE thus completed on 03/27/2019 and demonstrated moderate to severe MS.  Echocardiogram in 03/2019 showed EF 45-50%.  Patient also wore a cardiac monitor that showed PACs, PVCs, and episodes of PSVT and NSVT.  Due to patient's progressive mitral stenosis, he eventually underwent mitral valve replacement with a Sorin [employer] bileaflet mechanical valve in 05/2019.  While  postop after mitral valve replacement, patient developed postoperative atrial fibrillation.  He was started on IV amiodarone and converted to normal sinus rhythm.  On postop day 2, patient had an episode of complete heart block.  His amiodarone therapy was discontinued.  At discharge, he wore a 30-day cardiac monitor that showed sinus rhythm/sinus tachycardia with first-degree AV block and occasional PVCs.  Echocardiogram on 06/23/2019 showed EF 50-55%, no regional wall motion abnormalities, normal RV function.  There was a Sorin mechanical mitral valve present that appeared to be functioning normally.  There was no perivalvular MR.  Patient's most recent echocardiogram was completed on 01/22/2022.  Showed EF 55-60% with regional wall motion abnormalities, normal RV function.  Echo findings consistent with normal structure and function of the mitral valve prosthesis with mean mitral valve gradient 4.0 mmHg.  There was mild aortic regurgitation.  Overall there were no significant changes compared to previous study from a year prior.  It was recommended that patient undergo repeat echo in 1 year.  Due to presence of wall motion abnormalities on echocardiogram, patient underwent coronary CT scan on 03/02/2022 that showed a coronary calcium score of 0 and no evidence of CAD  On interview today, patient reports that he has been a bit more volume overloaded than usual. He takes lasix PRN, and he has been taking it about 2 times per week. When he has extra fluid on him, his face looks swollen, he develops abdominal distention and ankle edema. He also gets a bit of chest tightness. Symptoms improve when he takes lasix. He has been trying to exercise more, and he denies shortness of breath on exertion. However, he does  notice chest tightness on exertion. Sometimes has palpitations/feelings of racing heart beat. He wears and exercise watch and his HR is sometimes in the 110s at rest. Denies syncope, near syncope, dizziness.    ROS: Patient endorsed chest tightness on exertion, palpitations, tachycardia. Occasionally has ankle edema and abdominal distention. Denies orthopnea, dyspnea on exertion, dizziness, syncope, near syncope.   Studies Reviewed: .   Cardiac Studies & Procedures   CARDIAC CATHETERIZATION  CARDIAC CATHETERIZATION 12/11/2018  Narrative Images from the original result were not included.   Hemodynamic findings consistent with mitral valve stenosis.  Darren Haynes is a 37 y.o. male   [id number] LOCATION:  FACILITY: [facility] PHYSICIAN: Darren Haynes, M.D. 1986/09/13   DATE OF PROCEDURE:  12/11/2018  DATE OF DISCHARGE:     CARDIAC CATHETERIZATION    History obtained from chart review.Darren Haynes is a 37 y.o..  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  He was referred by Dr. Hanley Hays for evaluation of symptomatic mitral stenosis.  There is no history of rheumatic fever.  He has no other cardiac risk factors.  He is had progressive dyspnea for the last 9 months with some nonspecific aches and pains.  Is being worked up by rheumatologist.  Echo performed 11/21/2018 revealed normal LV systolic function, a peak mitral gradient of 47 mmHg with a mean of 29 mmHg and mitral valve area 1.5 cm with a PA pressure of 69 mmHg.  He does have severe dyspnea and lower extreme edema.  He is on oral diuretic.  He presented today for transesophageal echocardiogram performed Dr. Royann Shivers which showed classic rheumatic mitral stenosis with a mean gradient of 15 mmHg.  He presents now for right left heart cath to define his anatomy and physiology.  Impression [name] has severe mitral stenosis with rheumatic features, normal LV function.  His transvalvular gradient by TEE and right left heart cath for approximately 15 to 17 mmHg.  Apparently his valvular anatomy is suitable for mitral  valve balloon valvuloplasty.  I have discussed the case with Dr. Regino Schultze at [facility] who has agreed to see him for consultation and treatment.  The radial sheath was removed and a TR band was placed on the right wrist to achieve patent hemostasis.  The patient left the lab in stable condition.  He will be discharged home today as an outpatient and I will see him back in the office next week in follow-up.  Darren Haynes. MD, [facility] 12/11/2018 11:59 AM  Findings Coronary Findings Diagnostic  Dominance: Right  No diagnostic findings have been documented. Intervention  No interventions have been documented.     ECHOCARDIOGRAM  ECHOCARDIOGRAM COMPLETE 01/22/2022  Narrative ECHOCARDIOGRAM REPORT    Patient Name:   Darren Haynes Date of Exam: 01/22/2022 Medical Rec #:  [id number]       Height:       81.0 in Accession #:    [id number]      Weight:       214.9 lb Date of Birth:  1986/02/22       BSA:          2.392 m Patient Age:    36 years        BP:           130/85 mmHg Patient Gender: M  HR:           73 bpm. Exam Location:  [address]  Procedure: 2D Echo, Cardiac Doppler and Color Doppler  Indications:    Z98.89 s/p MVR  History:        Patient has prior history of Echocardiogram examinations, most recent 01/19/2021. CHF, Pulmonary HTN, Arrythmias:PSVT, Signs/Symptoms:Edema; Risk Factors:Sleep Apnea.  Mitral Valve: 29 mm Sorin [employer] bileaflet valve is present in the mitral position.  Sonographer:    Clearence Ped RCS Referring Phys: [phone] JONATHAN J BERRY  IMPRESSIONS   1. Left ventricular ejection fraction, by estimation, is 55 to 60%. The left ventricle has normal function. The left ventricle demonstrates regional wall motion abnormalities (see scoring diagram/findings for description). Left ventricular diastolic function could not be evaluated. There is severe hypokinesis of the left ventricular, entire inferior wall. 2. Right ventricular systolic  function is normal. The right ventricular size is normal. There is normal pulmonary artery systolic pressure. The estimated right ventricular systolic pressure is 16.5 mmHg. 3. The mitral valve has been repaired/replaced. Trivial mitral valve regurgitation. No evidence of mitral stenosis. The mean mitral valve gradient is 4.0 mmHg. There is a 29 mm Sorin [employer] bileaflet present in the mitral position. Echo findings are consistent with normal structure and function of the mitral valve prosthesis. 4. The aortic valve is tricuspid. Aortic valve regurgitation is mild. Aortic regurgitation PHT measures 379 msec. 5. The inferior vena cava is normal in size with greater than 50% respiratory variability, suggesting right atrial pressure of 3 mmHg.  Comparison(s): No significant change from prior study. Prior images reviewed side by side. 01/19/2021: LVEF 55-60%, 29 mm Sorin MVR with 4 mmHg mean gradient.  FINDINGS Left Ventricle: Left ventricular ejection fraction, by estimation, is 55 to 60%. The left ventricle has normal function. The left ventricle demonstrates regional wall motion abnormalities. Severe hypokinesis of the left ventricular, entire inferior wall. The left ventricular internal cavity size was normal in size. There is no left ventricular hypertrophy. Left ventricular diastolic function could not be evaluated due to mitral valve replacement. Left ventricular diastolic function could not be evaluated.  Right Ventricle: The right ventricular size is normal. No increase in right ventricular wall thickness. Right ventricular systolic function is normal. There is normal pulmonary artery systolic pressure. The tricuspid regurgitant velocity is 1.84 m/s, and with an assumed right atrial pressure of 3 mmHg, the estimated right ventricular systolic pressure is 16.5 mmHg.  Left Atrium: Left atrial size was normal in size.  Right Atrium: Right atrial size was normal in size.  Pericardium: There  is no evidence of pericardial effusion.  Mitral Valve: The mitral valve has been repaired/replaced. Trivial mitral valve regurgitation. There is a 29 mm Sorin [employer] bileaflet present in the mitral position. Echo findings are consistent with normal structure and function of the mitral valve prosthesis. No evidence of mitral valve stenosis. MV peak gradient, 7.7 mmHg. The mean mitral valve gradient is 4.0 mmHg.  Tricuspid Valve: The tricuspid valve is grossly normal. Tricuspid valve regurgitation is trivial.  Aortic Valve: The aortic valve is tricuspid. Aortic valve regurgitation is mild. Aortic regurgitation PHT measures 379 msec.  Pulmonic Valve: The pulmonic valve was grossly normal. Pulmonic valve regurgitation is trivial.  Aorta: The aortic root and ascending aorta are structurally normal, with no evidence of dilitation.  Venous: The inferior vena cava is normal in size with greater than 50% respiratory variability, suggesting right atrial pressure of 3 mmHg.  IAS/Shunts: No atrial level shunt  detected by color flow Doppler.   LEFT VENTRICLE PLAX 2D LVIDd:         5.00 cm   Diastology LVIDs:         3.80 cm   LV e' medial:    8.70 cm/s LV PW:         0.90 cm   LV E/e' medial:  13.7 LV IVS:        1.00 cm   LV e' lateral:   8.59 cm/s LVOT diam:     2.00 cm   LV E/e' lateral: 13.9 LV SV:         48 LV SV Index:   20 LVOT Area:     3.14 cm   RIGHT VENTRICLE RV Basal diam:  3.80 cm RV S prime:     11.00 cm/s TAPSE (M-mode): 2.6 cm RVSP:           16.5 mmHg  LEFT ATRIUM             Index        RIGHT ATRIUM           Index LA diam:        4.10 cm 1.71 cm/m   RA Pressure: 3.00 mmHg LA Vol (A2C):   63.8 ml 26.67 ml/m  RA Area:     13.00 cm LA Vol (A4C):   48.8 ml 20.40 ml/m  RA Volume:   30.20 ml  12.63 ml/m LA Biplane Vol: 55.8 ml 23.33 ml/m AORTIC VALVE LVOT Vmax:   73.40 cm/s LVOT Vmean:  48.100 cm/s LVOT VTI:    0.154 m AI PHT:      379 msec  AORTA Ao Root  diam: 2.90 cm Ao Asc diam:  3.00 cm  MITRAL VALVE                TRICUSPID VALVE MV Area (PHT): 3.19 cm     TR Peak grad:   13.5 mmHg MV Area VTI:   1.35 cm     TR Vmax:        184.00 cm/s MV Peak grad:  7.7 mmHg     Estimated RAP:  3.00 mmHg MV Mean grad:  4.0 mmHg     RVSP:           16.5 mmHg MV Vmax:       1.39 m/s MV Vmean:      96.3 cm/s    SHUNTS MV Decel Time: 238 msec     Systemic VTI:  0.15 m MV E velocity: 119.00 cm/s  Systemic Diam: 2.00 cm MV A velocity: 117.00 cm/s MV E/A ratio:  1.02  Zoila Shutter MD Electronically signed by Zoila Shutter MD Signature Date/Time: 01/22/2022/2:00:58 PM    Final   TEE  ECHO INTRAOPERATIVE TEE 05/14/2019  Narrative *INTRAOPERATIVE TRANSESOPHAGEAL REPORT *    Patient Name:   Darren Haynes Date of Exam: 05/14/2019 Medical Rec #:  [id number]       Height:       74.0 in Accession #:    [id number]      Weight:       211.0 lb Date of Birth:  08-17-1986       BSA:          2.22 m Patient Age:    33 years        BP:           127/67 mmHg Patient Gender: M  HR:           63 bpm. Exam Location:  Anesthesiology  Transesophogeal exam was perform intraoperatively during surgical procedure. Patient was closely monitored under general anesthesia during the entirety of examination.  Indications:     Pre-Mitral Valve Replacement Performing Phys: [id number] CLARENCE H OWEN Diagnosing Phys: Gaynelle Adu MD  Complications: No known complications during this procedure. POST-OP IMPRESSIONS - Left Ventricle: The left ventricle is unchanged from pre-bypass. - Aorta: The aorta appears unchanged from pre-bypass. - Left Atrial Appendage: The left atrial appendage appears unchanged from pre-bypass. - Aortic Valve: The aortic valve appears unchanged from pre-bypass. - Mitral Valve: A bioprosthetic valve was placed, leaflets thin and leaflets are freely mobile. Normal washing jets for valve type. - Tricuspid Valve: The tricuspid  valve appears unchanged from pre-bypass. - Interatrial Septum: The interatrial septum appears unchanged from pre-bypass. - Pericardium: The pericardium appears unchanged from pre-bypass.  PRE-OP FINDINGS Left Ventricle: The left ventricle has mildly reduced systolic function, with an ejection fraction of 45-50%. The cavity size was normal. There is no increase in left ventricular wall thickness.  Right Ventricle: The right ventricle has normal systolic function. The cavity was mildly enlarged. There is no increase in right ventricular wall thickness.  Left Atrium: Left atrial size was not assessed.  Right Atrium: Right atrial size was normal in size. Right atrial pressure is estimated at 10 mmHg.  Interatrial Septum: No atrial level shunt detected by color flow Doppler.  Pericardium: There is no evidence of pericardial effusion.  Mitral Valve: The mitral valve is rheumatic. Moderate thickening of the mitral valve leaflet. Mitral valve regurgitation is mild by color flow Doppler. There is no evidence of mitral valve vegetation.  Tricuspid Valve: The tricuspid valve was normal in structure. Tricuspid valve regurgitation was not visualized by color flow Doppler.  Aortic Valve: The aortic valve is normal in structure. Aortic valve regurgitation is trivial by color flow Doppler.  Pulmonic Valve: The pulmonic valve was normal in structure. Pulmonic valve regurgitation is trivial by color flow Doppler.   +--------------+-------++ LEFT VENTRICLE        +--------------+-------++ PLAX 2D               +--------------+-------++ LVIDd:        4.80 cm +--------------+-------++ LVIDs:        3.60 cm +--------------+-------++ LV SV:        53 ml   +--------------+-------++ LV SV Index:  23.61   +--------------+-------++                       +--------------+-------++  +-------------+----------++ MITRAL VALVE            +-------------+----------++ MV  Peak grad:13.4 mmHg  +-------------+----------++ MV Mean grad:6.3 mmHg   +-------------+----------++ MV Vmax:     1.83 m/s   +-------------+----------++ MV Vmean:    119.3 cm/s +-------------+----------++ MV VTI:      0.47 m     +-------------+----------++   Gaynelle Adu MD Electronically signed by Gaynelle Adu MD Signature Date/Time: 05/14/2019/2:52:01 PM    Final   MONITORS  CARDIAC EVENT MONITOR 07/01/2019  Narrative 1. SR/ST with first degree AVB 2. Occasional PVCs   CT SCANS  CT CORONARY MORPH W/CTA COR W/SCORE 03/02/2022  Addendum 03/05/2022  8:19 AM ADDENDUM REPORT: 03/05/2022 08:17  ADDENDUM: OVER-READ INTERPRETATION  CT CHEST  The following report is an over-read performed by radiologist Dr. Lovie Chol [facility] on 03/05/2022. This over-read  does not include interpretation of cardiac or coronary anatomy or pathology. The calcium score and coronary angiography evaluation interpretation by the cardiologist is attached. Imaging of the chest is focused on cardiac structures and excludes much of the chest on CT.  COMPARISON:  September 02, 2020.  FINDINGS:  Please see dedicated report for cardiovascular details.  Mediastinum/Nodes: No acute findings or mediastinal adenopathy with respect to visualized portions of the mediastinum.  Lungs/Pleura: Lungs are clear.  Airways are patent.  Upper Abdomen: No acute findings in the upper abdomen to the extent evaluated.  Musculoskeletal: No acute or destructive bone process.  IMPRESSION:  No acute or significant extracardiac findings.   Electronically Signed By: Donzetta Kohut M.D. On: 03/05/2022 08:17  Narrative CLINICAL DATA:  This is a 37 year old male with anginal symptoms.  EXAM: Cardiac/Coronary  CTA  TECHNIQUE: The patient was scanned on a [employer].  FINDINGS: A 100 kV prospective scan was triggered in the descending  thoracic aorta at 111 HU's. Axial non-contrast 3 mm slices were carried out through the heart. The data set was analyzed on a dedicated work station and scored using the Agatson method. Gantry rotation speed was 250 msecs and collimation was .6 mm. No beta blockade and 0.8 mg of sl NTG was given. The 3D data set was reconstructed in 5% intervals of the 67-82 % of the R-R cycle. Diastolic phases were analyzed on a dedicated work station using MPR, MIP and VRT modes. The patient received 80 cc of contrast.  Aorta: Normal size.  No calcifications.  No dissection.  Aortic Valve:  Trileaflet.  No calcifications.  Coronary Arteries:  Normal coronary origin.  Right dominance.  RCA is a large dominant artery that gives rise to PDA and PLA. There is no plaque.  Left main is a large artery that gives rise to LAD and LCX arteries.  LAD is a large vessel that has no plaque.  LCX is a non-dominant artery that gives rise to one large OM1 branch. There is no plaque.  Coronary Calcium Score:  Left main: 0  Left anterior descending artery: 0  Left circumflex artery: 0  Right coronary artery: 0  Total: 0  Percentile: 0  Other findings:  Normal pulmonary vein drainage into the left atrium.  Normal left atrial appendage without a thrombus.  Normal size of the pulmonary artery.  History of mitral valve replacement - mechanical valve seated in the mitral position.  IMPRESSION: 1. Coronary calcium score of 0. This was 0 percentile for age and sex matched control.  2. Normal coronary origin with right dominance.  3. CAD-RADS 0. No evidence of CAD (0%). Consider non-atherosclerotic causes of chest pain.  The noncardiac portion of this study will be interpreted in separate report by the radiologist.  Electronically Signed: By: Thomasene Ripple D.O. On: 03/02/2022 17:05          Risk Assessment/Calculations:    CHA2DS2-VASc Score = 1  This indicates a 0.6% annual risk of  stroke. The patient's score is based upon: CHF History: 1 HTN History: 0 Diabetes History: 0 Stroke History: 0 Vascular Disease History: 0 Age Score: 0 Gender Score: 0         Physical Exam:   VS:  BP (!) 126/90 (BP Location: Right Arm, Patient Position: Sitting, Cuff Size: Normal)   Pulse 84   Ht 6\' 2"  (1.88 m)   Wt 192 lb 9.6 oz (87.4 kg)   SpO2 98%   BMI  24.73 kg/m    Wt Readings from Last 3 Encounters:  07/10/23 192 lb 9.6 oz (87.4 kg)  05/03/22 207 lb (93.9 kg)  03/15/22 199 lb 9.6 oz (90.5 kg)    GEN: Well nourished, well developed in no acute distress. Sitting comfortably on the exam table  NECK: No JVD; No carotid bruits CARDIAC: RRR, no murmurs, rubs, gallops. Mechanical click present. Radial pulses 2+ bilaterally  RESPIRATORY:  Clear to auscultation without rales, wheezing or rhonchi. Normal work of breathing on room air  ABDOMEN: Soft, non-tender, non-distended EXTREMITIES:  No edema in BLE; No deformity   ASSESSMENT AND PLAN: .    History of mechanical MVR  -Patient underwent mitral valve replacement with a Sorin [employer] bileaflet mechanical valve in 05/2019 -Most recent echocardiogram from 01/22/2022 showed findings consistent with normal structure and function of mitral valve prosthesis with mean mitral valve gradient of 4 mmHg -Patient is due for their annual echocardiogram to monitor MVR-I placed the order today -Patient on Coumadin.  Followed by our Coumadin clinic.  INR goal is 2.5-3.5. Denies bleeding on coumadin   Chronic diastolic heart failure -Most recent echocardiogram from 01/2022 showed EF 55-60% -Patient currently on Lasix 40 mg daily as needed for fluid retention. Reports taking lasix about 2 times per week  - Euvolemic on exam today  - Ordered BMP to monitor renal function and K on lasix  - Ordered echocardiogram as above   Chest Tightness  - Coronary CTA from 02/2022 showed coronary calcium score of 0  - Patient reports that recently he  has been having chest tightness on exertion. He also notices chest tightness when he feels volume overloaded. - Ordered nuclear stress test for evaluation of exertional chest tightness   Pulmonary HTN - prior to MVR, patient had significant pulmonary artery HTN. Did improve when he had his mitral valve repaired  - most recent echocardiogram from 01/2022 showed normal pulmonary artery systolic pressure  - Ordered echocardiogram as above   Postop atrial fibrillation Palpitations  -Patient did have atrial fibrillation after mitral valve replacement.  He converted on IV amiodarone and has not had know recurrence of A-fib since - Today, patient reports that he has been having some palpitations/feeling of racing heart beat. His HR can get into the 170s when exercising, which is higher than usual. He also sometimes has HR in the 110s-120s when at rest  - Ordered 14 day zio patch  -On Coumadin as above for mechanical mitral valve - CHADS-VASc 1. Followed by coumadin clinic    Dispo: Follow up in 2-3 months   Signed, Jonita Albee, PA-C

## 2023-07-07 LAB — POCT INR: INR: 3.1 — AB (ref 2.0–3.0)

## 2023-07-08 ENCOUNTER — Ambulatory Visit (INDEPENDENT_AMBULATORY_CARE_PROVIDER_SITE_OTHER): Payer: 59 | Admitting: Cardiology

## 2023-07-08 DIAGNOSIS — Z5181 Encounter for therapeutic drug level monitoring: Secondary | ICD-10-CM | POA: Diagnosis not present

## 2023-07-08 DIAGNOSIS — Z954 Presence of other heart-valve replacement: Secondary | ICD-10-CM | POA: Diagnosis not present

## 2023-07-10 ENCOUNTER — Ambulatory Visit (INDEPENDENT_AMBULATORY_CARE_PROVIDER_SITE_OTHER): Payer: 59

## 2023-07-10 ENCOUNTER — Encounter: Payer: Self-pay | Admitting: Cardiology

## 2023-07-10 ENCOUNTER — Ambulatory Visit: Payer: 59 | Attending: Cardiology | Admitting: Cardiology

## 2023-07-10 ENCOUNTER — Encounter (HOSPITAL_COMMUNITY): Payer: Self-pay

## 2023-07-10 VITALS — BP 126/90 | HR 84 | Ht 74.0 in | Wt 192.6 lb

## 2023-07-10 DIAGNOSIS — Z952 Presence of prosthetic heart valve: Secondary | ICD-10-CM | POA: Diagnosis not present

## 2023-07-10 DIAGNOSIS — R002 Palpitations: Secondary | ICD-10-CM | POA: Diagnosis not present

## 2023-07-10 DIAGNOSIS — R079 Chest pain, unspecified: Secondary | ICD-10-CM | POA: Diagnosis not present

## 2023-07-10 DIAGNOSIS — I480 Paroxysmal atrial fibrillation: Secondary | ICD-10-CM

## 2023-07-10 DIAGNOSIS — I5032 Chronic diastolic (congestive) heart failure: Secondary | ICD-10-CM

## 2023-07-10 LAB — BASIC METABOLIC PANEL
BUN/Creatinine Ratio: 11 (ref 9–20)
BUN: 12 mg/dL (ref 6–20)
CO2: 24 mmol/L (ref 20–29)
Calcium: 9.3 mg/dL (ref 8.7–10.2)
Chloride: 98 mmol/L (ref 96–106)
Creatinine, Ser: 1.06 mg/dL (ref 0.76–1.27)
Glucose: 77 mg/dL (ref 70–99)
Potassium: 3.9 mmol/L (ref 3.5–5.2)
Sodium: 136 mmol/L (ref 134–144)
eGFR: 94 mL/min/{1.73_m2} (ref >59)

## 2023-07-10 LAB — MAGNESIUM: Magnesium: 2.2 mg/dL (ref 1.6–2.3)

## 2023-07-10 LAB — TSH: TSH: 0.8 u[IU]/mL (ref 0.450–4.500)

## 2023-07-10 NOTE — Patient Instructions (Addendum)
Medication Instructions:  Your physician recommends that you continue on your current medications as directed. Please refer to the Current Medication list given to you today.    *If you need a refill on your cardiac medications before your next appointment, please call your pharmacy*   Lab Work: BMET, MAGNESIUM, TSH If you have labs (blood work) drawn today and your tests are completely normal, you will receive your results only by: MyChart Message (if you have MyChart) OR A paper copy in the mail If you have any lab test that is abnormal or we need to change your treatment, we will call you to review the results.   Testing/Procedures:  Christena Deem- Long Term Monitor Instructions   Your physician has requested you wear your ZIO patch monitor___14____days.   This is a single patch monitor.  [employer] supplies one patch monitor per enrollment.  Additional stickers are not available.   Please do not apply patch if you will be having a Nuclear Stress Test, Echocardiogram, Cardiac CT, MRI, or Chest Xray during the time frame you would be wearing the monitor. The patch cannot be worn during these tests.  You cannot remove and re-apply the ZIO XT patch monitor.   Your ZIO patch monitor will be sent USPS Priority mail from [facility] directly to your home address. The monitor may also be mailed to a PO BOX if home delivery is not available.   It may take 3-5 days to receive your monitor after you have been enrolled.   Once you have received you monitor, please review enclosed instructions.  Your monitor has already been registered assigning a specific monitor serial # to you.   Applying the monitor   Shave hair from upper left chest.   Hold abrader disc by orange tab.  Rub abrader in 40 strokes over left upper chest as indicated in your monitor instructions.   Clean area with 4 enclosed alcohol pads .  Use all pads to assure are is cleaned thoroughly.  Let dry.   Apply patch as  indicated in monitor instructions.  Patch will be place under collarbone on left side of chest with arrow pointing upward.   Rub patch adhesive wings for 2 minutes.Remove white label marked "1".  Remove white label marked "2".  Rub patch adhesive wings for 2 additional minutes.   While looking in a mirror, press and release button in center of patch.  A small green light will flash 3-4 times .  This will be your only indicator the monitor has been turned on.     Do not shower for the first 24 hours.  You may shower after the first 24 hours.   Press button if you feel a symptom. You will hear a small click.  Record Date, Time and Symptom in the Patient Log Book.   When you are ready to remove patch, follow instructions on last 2 pages of Patient Log Book.  Stick patch monitor onto last page of Patient Log Book.   Place Patient Log Book in [place] box.  Use locking tab on box and tape box closed securely.  The [employer] and [employer] has [employer] on it.  Please place in mailbox as soon as possible.  Your physician should have your test results approximately 7 days after the monitor has been mailed back to [facility].   Call [facility] Customer Care at [phone] if you have questions regarding your ZIO XT patch monitor.  Call them immediately if you  see an orange light blinking on your monitor.   If your monitor falls off in less than 4 days contact our Monitor department at [phone].  If your monitor becomes loose or falls off after 4 days call [employer] at [phone] for suggestions on securing your monitor.      Your physician has requested that you have a lexiscan myoview. For further information please visit [email or web address] Please follow instruction sheet, as given.   Your physician has requested that you have an echocardiogram. Echocardiography is a painless test that uses sound waves to create images of your heart. It provides your doctor with information about the  size and shape of your heart and how well your heart's chambers and valves are working. This procedure takes approximately one hour. There are no restrictions for this procedure. Please do NOT wear cologne, perfume, aftershave, or lotions (deodorant is allowed). Please arrive 15 minutes prior to your appointment time.    Follow-Up: At [facility], you and your health needs are our priority.  As part of our continuing mission to provide you with exceptional heart care, we have created designated Provider Care Teams.  These Care Teams include your primary Cardiologist (physician) and Advanced Practice Providers (APPs -  Physician Assistants and Nurse Practitioners) who all work together to provide you with the care you need, when you need it.  We recommend signing up for the patient portal called "MyChart".  Sign up information is provided on this After Visit Summary.  MyChart is used to connect with patients for Virtual Visits (Telemedicine).  Patients are able to view lab/test results, encounter notes, upcoming appointments, etc.  Non-urgent messages can be sent to your provider as well.   To learn more about what you can do with MyChart, go to [email or web address].    Your next appointment:   2-3 month(s)  Provider:   Any available APP

## 2023-07-10 NOTE — Progress Notes (Unsigned)
Enrolled patient for a 14 day Zio XT monitor to be mailed to patients home   Gwenlyn Found to read

## 2023-07-12 DIAGNOSIS — R002 Palpitations: Secondary | ICD-10-CM

## 2023-07-19 ENCOUNTER — Ambulatory Visit (HOSPITAL_COMMUNITY): Payer: 59 | Attending: Cardiology

## 2023-07-19 DIAGNOSIS — R079 Chest pain, unspecified: Secondary | ICD-10-CM | POA: Insufficient documentation

## 2023-07-19 LAB — MYOCARDIAL PERFUSION IMAGING
LV dias vol: 98 mL (ref 62–150)
LV sys vol: 52 mL
Nuc Stress EF: 47 %
Peak HR: 104 {beats}/min
Rest HR: 93 {beats}/min
Rest Nuclear Isotope Dose: 10.9 mCi
SDS: 0
SRS: 0
SSS: 0
ST Depression (mm): 0 mm
Stress Nuclear Isotope Dose: 1145 mCi
TID: 0.92

## 2023-07-19 MED ORDER — TECHNETIUM TC 99M TETROFOSMIN IV KIT
10.9000 | PACK | Freq: Once | INTRAVENOUS | Status: AC | PRN
  Administered 2023-07-19: 10.9 via INTRAVENOUS

## 2023-07-19 MED ORDER — REGADENOSON 0.4 MG/5ML IV SOLN
0.4000 mg | Freq: Once | INTRAVENOUS | Status: AC
  Administered 2023-07-19: 0.4 mg via INTRAVENOUS

## 2023-07-19 MED ORDER — TECHNETIUM TC 99M TETROFOSMIN IV KIT
31.0000 | PACK | Freq: Once | INTRAVENOUS | Status: AC | PRN
  Administered 2023-07-19: 31 via INTRAVENOUS

## 2023-07-21 LAB — POCT INR: INR: 4.4 — AB (ref 2.0–3.0)

## 2023-07-22 ENCOUNTER — Ambulatory Visit (INDEPENDENT_AMBULATORY_CARE_PROVIDER_SITE_OTHER): Payer: 59 | Admitting: Cardiovascular Disease

## 2023-07-22 DIAGNOSIS — Z954 Presence of other heart-valve replacement: Secondary | ICD-10-CM | POA: Diagnosis not present

## 2023-07-22 DIAGNOSIS — Z5181 Encounter for therapeutic drug level monitoring: Secondary | ICD-10-CM

## 2023-07-26 ENCOUNTER — Ambulatory Visit (HOSPITAL_COMMUNITY): Payer: 59 | Attending: Cardiology

## 2023-07-26 DIAGNOSIS — Z952 Presence of prosthetic heart valve: Secondary | ICD-10-CM | POA: Insufficient documentation

## 2023-07-26 LAB — ECHOCARDIOGRAM COMPLETE
Area-P 1/2: 2.62 cm2
MV VTI: 1.61 cm2
S' Lateral: 3.6 cm

## 2023-08-05 ENCOUNTER — Ambulatory Visit (INDEPENDENT_AMBULATORY_CARE_PROVIDER_SITE_OTHER): Payer: 59 | Admitting: Cardiology

## 2023-08-05 ENCOUNTER — Other Ambulatory Visit: Payer: Self-pay | Admitting: Cardiovascular Disease

## 2023-08-05 DIAGNOSIS — Z5181 Encounter for therapeutic drug level monitoring: Secondary | ICD-10-CM

## 2023-08-05 DIAGNOSIS — Z954 Presence of other heart-valve replacement: Secondary | ICD-10-CM

## 2023-08-05 LAB — POCT INR: INR: 3.9 — AB (ref 2.0–3.0)

## 2023-08-05 NOTE — Telephone Encounter (Signed)
Prescription refill request received for warfarin Lov:  07/10/23 Laural Benes)  Next INR check: 08/19/23 Warfarin tablet strength: 5mg   Appropriate dose. Refill sent.

## 2023-08-14 ENCOUNTER — Telehealth: Payer: Self-pay

## 2023-08-14 NOTE — Telephone Encounter (Signed)
-----   Message from Jonita Albee sent at 08/13/2023  4:44 PM EDT ----- Please tell patient that his cardiac monitor showed sinus rhythm. There was one run of SVT that only lasted 4 beats. There were occasional PVCs, PACs. Overall, normal monitor.   Thanks [employer]

## 2023-08-14 NOTE — Telephone Encounter (Signed)
Spoke with patient and he is aware of monitor results. H e states that he sent in a new monitor with 9 more days of results we should have that soon.

## 2023-08-18 LAB — POCT INR: INR: 3.2 — AB (ref 2.0–3.0)

## 2023-08-19 ENCOUNTER — Ambulatory Visit (INDEPENDENT_AMBULATORY_CARE_PROVIDER_SITE_OTHER): Payer: 59

## 2023-08-19 DIAGNOSIS — Z5181 Encounter for therapeutic drug level monitoring: Secondary | ICD-10-CM

## 2023-08-19 NOTE — Patient Instructions (Signed)
Description   Spoke to patient. Instructed to continue taking Warfarin 1 tablet daily. Please call for any upcoming procedures. Recheck INR in 2 weeks. *Self Tester*  [facility] [phone].

## 2023-08-31 NOTE — Progress Notes (Signed)
Cardiology Office Note:  .   Date:  09/09/2023  ID:  Darren Haynes, DOB 1986/04/16, MRN [id number] PCP: [facility]  [facility] Providers Cardiologist:  Nanetta Batty, MD Cardiology APP:  Azalee Course, [place]   History of Present Illness: [name] Kitchen   Darren Haynes is a 37 y.o. 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  Patient is followed by Dr. Allyson Sabal and presents today for an annual follow-up appointment.   Per chart review, patient was first seen by Dr. Allyson Sabal in 11/2018.  Patient was sent for echocardiogram which was completed 11/21/2018 and showed normal LV function, peak mitral valve gradient of 47 mmHg with mean gradient 29 mmHg.  Mitral valve area was 1.5 cm.  Patient complained of severe dyspnea and lower extremity edema.  He underwent TEE on 12/11/2018 that showed EF 55-60%, no regional wall motion abnormalities.  Findings were consistent with severe mitral stenosis with classic findings for rheumatic disease.  He underwent right and left heart catheterization which revealed a significant mitral valve gradient but normal coronary arteries.  Patient was referred to Dr. Renaldo Reel at [facility] who performed successful mitral balloon valvuloplasty in 12/2018 with immediate improvement in his pulmonary artery pressure and his symptoms.  Unfortunately about 2 months later in 02/2019, patient again had progressive dyspnea and lower extremity edema.  He again was sent for TEE thus completed on 03/27/2019 and demonstrated moderate to severe MS.  Echocardiogram in 03/2019 showed EF 45-50%.  Patient also wore a cardiac monitor that showed PACs, PVCs, and episodes of PSVT and NSVT.  Due to patient's progressive mitral stenosis, he eventually underwent mitral valve replacement with a Sorin [employer] bileaflet mechanical valve in 05/2019.   While  postop after mitral valve replacement, patient developed postoperative atrial fibrillation.  He was started on IV amiodarone and converted to normal sinus rhythm.  On postop day 2, patient had an episode of complete heart block.  His amiodarone therapy was discontinued.  At discharge, he wore a 30-day cardiac monitor that showed sinus rhythm/sinus tachycardia with first-degree AV block and occasional PVCs.  Echocardiogram on 06/23/2019 showed EF 50-55%, no regional wall motion abnormalities, normal RV function.  There was a Sorin mechanical mitral valve present that appeared to be functioning normally.  There was no perivalvular MR.   Patient's most recent echocardiogram was completed on 01/22/2022.  Showed EF 55-60% with regional wall motion abnormalities, normal RV function.  Echo findings consistent with normal structure and function of the mitral valve prosthesis with mean mitral valve gradient 4.0 mmHg.  There was mild aortic regurgitation.  Overall there were no significant changes compared to previous study from a year prior.  It was recommended that patient undergo repeat echo in 1 year.  Due to presence of wall motion abnormalities on echocardiogram, patient underwent coronary CT scan on 03/02/2022 that showed a coronary calcium score of 0 and no evidence of CAD  I last saw the patient on 07/10/2023 in clinic.  At that time, patient reported chest tightness, shortness of breath, palpitations.  He underwent nuclear stress test on 07/19/2023 that showed no ischemia, no infarction.  Overall was a low risk study.  He underwent echocardiogram on 07/26/2023 that showed EF 55-60%, no regional wall motion abnormalities, normal RV function. There was a mechanical mitral valve present, and echo findings were consistent with normal structure and function  of the mitral valve prosthesis (mean gradient 5.0, trivial MR.) He wore a 2 week zio that showed predominantly sinus rhythm, one episode of SVT that lasted 4 beats, and  rare ectopy.   Today, patient presented for a follow up appointment. Denied any specific concerns and has overall been feeling well. At his last appointment, he had reported chest pain and palpitations, which prompted stress test and cardiac monitor. We reviewed results of these studies. Patient reports that chest pain, palpitations, and shortness of breath have since subsided and he has not had recurrence since testing was complete. Patient is on warfarin due to his mechanical heart valve. Denies bleeding issues and his INR has been stable. Denied dizziness. Patient takes Lasix and potassium as needed. he reported that he has not needed to take Lasix in the past two weeks. Denies any swelling in his ankles. He continues to see [facility] cardiology intermittently to monitor his valve.   ROS: Denies chest pain, palpitations, shortness of breath, ankle edema, dizziness, syncope, near syncope    Studies Reviewed: .   Cardiac Studies & Procedures   CARDIAC CATHETERIZATION  CARDIAC CATHETERIZATION 12/11/2018  Narrative Images from the original result were not included.   Hemodynamic findings consistent with mitral valve stenosis.  Darren Haynes is a 37 y.o. male   [id number] LOCATION:  FACILITY: [facility] PHYSICIAN: Nanetta Batty, M.D. [birth year]   DATE OF PROCEDURE:  12/11/2018  DATE OF DISCHARGE:     CARDIAC CATHETERIZATION    History obtained from chart review.Darren Haynes is a 37 y.o..  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  He was referred by Dr. Hanley Hays for evaluation of symptomatic mitral stenosis.  There is no history of rheumatic fever.  He has no other cardiac risk factors.  He is had progressive dyspnea for the last 9 months with some nonspecific aches and pains.  Is being worked up by rheumatologist.  Echo performed 11/21/2018 revealed normal LV systolic  function, a peak mitral gradient of 47 mmHg with a mean of 29 mmHg and mitral valve area 1.5 cm with a PA pressure of 69 mmHg.  He does have severe dyspnea and lower extreme edema.  He is on oral diuretic.  He presented today for transesophageal echocardiogram performed Dr. Royann Shivers which showed classic rheumatic mitral stenosis with a mean gradient of 15 mmHg.  He presents now for right left heart cath to define his anatomy and physiology.  Impression Mr. Carmona has severe mitral stenosis with rheumatic features, normal LV function.  His transvalvular gradient by TEE and right left heart cath for approximately 15 to 17 mmHg.  Apparently his valvular anatomy is suitable for mitral valve balloon valvuloplasty.  I have discussed the case with Dr. Regino Schultze at [facility] who has agreed to see him for consultation and treatment.  The radial sheath was removed and a TR band was placed on the right wrist to achieve patent hemostasis.  The patient left the lab in stable condition.  He will be discharged home today as an outpatient and I will see him back in the office next week in follow-up.  Nanetta Batty. MD, [facility] 12/11/2018 11:59 AM  Findings Coronary Findings Diagnostic  Dominance: Right  No diagnostic findings have been documented. Intervention  No interventions have been documented.   STRESS TESTS  MYOCARDIAL PERFUSION IMAGING 07/19/2023  Narrative   Findings are consistent with  no ischemia and no infarction. The study is low risk.   No ST deviation was noted.   LV perfusion is normal. There is no evidence of ischemia. There is no evidence of infarction.   Left ventricular function is abnormal. Global function is mildly reduced. There were no regional wall motion abnormalities. End diastolic cavity size is normal. End systolic cavity size is mildly enlarged.   Prior study not available for comparison.   Normal pefusion Estimated EF 47% suggest echo /MRI correlation for EF    ECHOCARDIOGRAM  ECHOCARDIOGRAM COMPLETE 07/26/2023  Narrative ECHOCARDIOGRAM REPORT    Patient Name:   Darren Haynes Date of Exam: 07/26/2023 Medical Rec #:  [id number]       Height:       74.0 in Accession #:    [id number]      Weight:       192.0 lb Date of Birth:  1985-12-05       BSA:          2.136 m Patient Age:    37 years        BP:           126/90 mmHg Patient Gender: M               HR:           92 bpm. Exam Location:  [address]  Procedure: 2D Echo, Cardiac Doppler, Color Doppler, 3D Echo and Strain Analysis  Indications:    I34.0 Mitral (valve) insufficiency  History:        Patient has prior history of Echocardiogram examinations, most recent 01/22/2022. CHF, Signs/Symptoms:Dyspnea; Risk Factors:Sleep Apnea. Rheumatic mitral valve stenosis. Mitral valve replacement (mechanical valve). Palpitations. Postop atrial fibrillation. Pulmonary hypertension. Lower extremity edema. Chest tightness.  Mitral Valve: 29 mm Sorin [employer] mechanical valve valve is present in the mitral position.  Sonographer:    Cathie Beams RCS Referring Phys: [id number] Jonita Albee  IMPRESSIONS   1. Left ventricular ejection fraction, by estimation, is 55 to 60%. Left ventricular ejection fraction by 3D volume is 56 %. The left ventricle has normal function. The left ventricle has no regional wall motion abnormalities. Left ventricular diastolic function could not be evaluated. 2. Right ventricular systolic function is normal. The right ventricular size is normal. Tricuspid regurgitation signal is inadequate for assessing PA pressure. 3. The mitral valve has been repaired/replaced. Trivial mitral valve regurgitation. The mean mitral valve gradient is 5.0 mmHg with average heart rate of 86 bpm. Moderate mitral annular calcification. There is a 29 mm Sorin [employer] mechanical valve present in the mitral position. Echo findings are consistent with normal structure and function  of the mitral valve prosthesis. 4. The aortic valve is tricuspid. Aortic valve regurgitation is mild. Aortic valve sclerosis/calcification is present, without any evidence of aortic stenosis.  Comparison(s): No significant change from prior study. 01/22/2022: LVEF 55-60%, 29 mm mechanical MVR with 4 mmHg mean gradient.  FINDINGS Left Ventricle: Left ventricular ejection fraction, by estimation, is 55 to 60%. Left ventricular ejection fraction by 3D volume is 56 %. The left ventricle has normal function. The left ventricle has no regional wall motion abnormalities. The left ventricular internal cavity size was normal in size. There is no left ventricular hypertrophy. Left ventricular diastolic function could not be evaluated due to mitral valve replacement. Left ventricular diastolic function could not be evaluated.  Right Ventricle: The right ventricular size is normal. No increase in right ventricular wall thickness. Right ventricular  systolic function is normal. Tricuspid regurgitation signal is inadequate for assessing PA pressure.  Left Atrium: Left atrial size was normal in size.  Right Atrium: Right atrial size was normal in size.  Pericardium: There is no evidence of pericardial effusion.  Mitral Valve: The mitral valve has been repaired/replaced. There is moderate calcification of the posterior and anterior mitral valve leaflet(s). Moderate mitral annular calcification. Trivial mitral valve regurgitation. There is a 29 mm Sorin [employer] mechanical valve present in the mitral position. Echo findings are consistent with normal structure and function of the mitral valve prosthesis. MV peak gradient, 8.6 mmHg. The mean mitral valve gradient is 5.0 mmHg with average heart rate of 86 bpm.  Tricuspid Valve: The tricuspid valve is normal in structure. Tricuspid valve regurgitation is not demonstrated.  Aortic Valve: The aortic valve is tricuspid. Aortic valve regurgitation is mild. Aortic  valve sclerosis/calcification is present, without any evidence of aortic stenosis.  Pulmonic Valve: The pulmonic valve was grossly normal. Pulmonic valve regurgitation is trivial.  Aorta: The aortic root and ascending aorta are structurally normal, with no evidence of dilitation.  IAS/Shunts: No atrial level shunt detected by color flow Doppler.   LEFT VENTRICLE PLAX 2D LVIDd:         4.90 cm LVIDs:         3.60 cm LV PW:         1.00 cm         3D Volume EF LV IVS:        0.90 cm         LV 3D EF:    Left LVOT diam:     2.10 cm                      ventricul LV SV:         55                           ar LV SV Index:   26                           ejection LVOT Area:     3.46 cm                     fraction by 3D volume is 56 %.  3D Volume EF: 3D EF:        56 % LV EDV:       97 ml LV ESV:       43 ml LV SV:        54 ml  RIGHT VENTRICLE RV Basal diam:  2.70 cm RV S prime:     11.60 cm/s TAPSE (M-mode): 1.2 cm  LEFT ATRIUM           Index        RIGHT ATRIUM          Index LA diam:      3.40 cm 1.59 cm/m   RA Area:     9.12 cm LA Vol (A4C): 39.4 ml 18.44 ml/m  RA Volume:   15.80 ml 7.40 ml/m AORTIC VALVE LVOT Vmax:   92.60 cm/s LVOT Vmean:  68.300 cm/s LVOT VTI:    0.160 m  AORTA Ao Root diam: 3.20 cm Ao Asc diam:  3.00 cm  MITRAL VALVE MV Area (PHT): 2.62 cm     SHUNTS MV Area  VTI:   1.61 cm     Systemic VTI:  0.16 m MV Peak grad:  8.6 mmHg     Systemic Diam: 2.10 cm MV Mean grad:  5.0 mmHg MV Vmax:       1.46 m/s MV Vmean:      104.0 cm/s MV Decel Time: 289 msec MV E velocity: 115.00 cm/s MV A velocity: 111.00 cm/s MV E/A ratio:  1.04  Zoila Shutter MD Electronically signed by Zoila Shutter MD Signature Date/Time: 07/26/2023/4:31:06 PM    Final   TEE  ECHO INTRAOPERATIVE TEE 05/14/2019  Narrative *INTRAOPERATIVE TRANSESOPHAGEAL REPORT *    Patient Name:   Darren Haynes Date of Exam: 05/14/2019 Medical Rec #:  [id number]       Height:        74.0 in Accession #:    [id number]      Weight:       211.0 lb Date of Birth:  Sep 28, 1986       BSA:          2.22 m Patient Age:    33 years        BP:           127/67 mmHg Patient Gender: M               HR:           63 bpm. Exam Location:  Anesthesiology  Transesophogeal exam was perform intraoperatively during surgical procedure. Patient was closely monitored under general anesthesia during the entirety of examination.  Indications:     Pre-Mitral Valve Replacement Performing Phys: [id number] CLARENCE H OWEN Diagnosing Phys: Gaynelle Adu MD  Complications: No known complications during this procedure. POST-OP IMPRESSIONS - Left Ventricle: The left ventricle is unchanged from pre-bypass. - Aorta: The aorta appears unchanged from pre-bypass. - Left Atrial Appendage: The left atrial appendage appears unchanged from pre-bypass. - Aortic Valve: The aortic valve appears unchanged from pre-bypass. - Mitral Valve: A bioprosthetic valve was placed, leaflets thin and leaflets are freely mobile. Normal washing jets for valve type. - Tricuspid Valve: The tricuspid valve appears unchanged from pre-bypass. - Interatrial Septum: The interatrial septum appears unchanged from pre-bypass. - Pericardium: The pericardium appears unchanged from pre-bypass.  PRE-OP FINDINGS Left Ventricle: The left ventricle has mildly reduced systolic function, with an ejection fraction of 45-50%. The cavity size was normal. There is no increase in left ventricular wall thickness.  Right Ventricle: The right ventricle has normal systolic function. The cavity was mildly enlarged. There is no increase in right ventricular wall thickness.  Left Atrium: Left atrial size was not assessed.  Right Atrium: Right atrial size was normal in size. Right atrial pressure is estimated at 10 mmHg.  Interatrial Septum: No atrial level shunt detected by color flow Doppler.  Pericardium: There is no evidence of pericardial  effusion.  Mitral Valve: The mitral valve is rheumatic. Moderate thickening of the mitral valve leaflet. Mitral valve regurgitation is mild by color flow Doppler. There is no evidence of mitral valve vegetation.  Tricuspid Valve: The tricuspid valve was normal in structure. Tricuspid valve regurgitation was not visualized by color flow Doppler.  Aortic Valve: The aortic valve is normal in structure. Aortic valve regurgitation is trivial by color flow Doppler.  Pulmonic Valve: The pulmonic valve was normal in structure. Pulmonic valve regurgitation is trivial by color flow Doppler.   +--------------+-------++ LEFT VENTRICLE        +--------------+-------++ PLAX 2D               +--------------+-------++  LVIDd:        4.80 cm +--------------+-------++ LVIDs:        3.60 cm +--------------+-------++ LV SV:        53 ml   +--------------+-------++ LV SV Index:  23.61   +--------------+-------++                       +--------------+-------++  +-------------+----------++ MITRAL VALVE            +-------------+----------++ MV Peak grad:13.4 mmHg  +-------------+----------++ MV Mean grad:6.3 mmHg   +-------------+----------++ MV Vmax:     1.83 m/s   +-------------+----------++ MV Vmean:    119.3 cm/s +-------------+----------++ MV VTI:      0.47 m     +-------------+----------++   Gaynelle Adu MD Electronically signed by Gaynelle Adu MD Signature Date/Time: 05/14/2019/2:52:01 PM    Final   MONITORS  LONG TERM MONITOR (3-14 DAYS) 08/09/2023  Narrative Patch Wear Time:  13 days and 0 hours (2024-08-30T15:16:14-0400 to 2024-09-21T17:34:40-0400)  Monitor 1 Patient had a min HR of 54 bpm, max HR of 138 bpm, and avg HR of 86 bpm. Predominant underlying rhythm was Sinus Rhythm. Slight P wave morphology changes were noted. Isolated SVEs were rare (<1.0%), SVE Couplets were rare (<1.0%), and SVE Triplets  were rare (<1.0%). Isolated VEs were rare (<1.0%, 6), VE Triplets were rare (<1.0%, 1), and no VE Couplets were present.  Monitor 2 Patient had a min HR of 50 bpm, max HR of 160 bpm, and avg HR of 81 bpm. Predominant underlying rhythm was Sinus Rhythm. Slight P wave morphology changes were noted. First Degree AV Block was present. 1 run of Supraventricular Tachycardia occurred lasting 4 beats with a max rate of 160 bpm (avg 151 bpm). Supraventricular Tachycardia was detected within +/- 45 seconds of symptomatic patient event(s). Isolated SVEs were rare (<1.0%), SVE Couplets were rare (<1.0%), and SVE Triplets were rare (<1.0%). Isolated VEs were rare (<1.0%), VE Couplets were rare (<1.0%), and no VE Triplets were present.  SR/SB/ST Short runs of SVT Occasional PACs/PVCs   CT SCANS  CT CORONARY MORPH W/CTA COR W/SCORE 03/02/2022  Addendum 03/05/2022  8:19 AM ADDENDUM REPORT: 03/05/2022 08:17  ADDENDUM: OVER-READ INTERPRETATION  CT CHEST  The following report is an over-read performed by radiologist Dr. Lovie Chol [facility] on 03/05/2022. This over-read does not include interpretation of cardiac or coronary anatomy or pathology. The calcium score and coronary angiography evaluation interpretation by the cardiologist is attached. Imaging of the chest is focused on cardiac structures and excludes much of the chest on CT.  COMPARISON:  September 02, 2020.  FINDINGS:  Please see dedicated report for cardiovascular details.  Mediastinum/Nodes: No acute findings or mediastinal adenopathy with respect to visualized portions of the mediastinum.  Lungs/Pleura: Lungs are clear.  Airways are patent.  Upper Abdomen: No acute findings in the upper abdomen to the extent evaluated.  Musculoskeletal: No acute or destructive bone process.  IMPRESSION:  No acute or significant extracardiac findings.   Electronically Signed By: Donzetta Kohut M.D. On: 03/05/2022  08:17  Narrative CLINICAL DATA:  This is a 37 year old male with anginal symptoms.  EXAM: Cardiac/Coronary  CTA  TECHNIQUE: The patient was scanned on a [employer].  FINDINGS: A 100 kV prospective scan was triggered in the descending thoracic aorta at 111 HU's. Axial non-contrast 3 mm slices were carried out through the heart. The data set was analyzed on a dedicated work station and scored  using the Agatson method. Gantry rotation speed was 250 msecs and collimation was .6 mm. No beta blockade and 0.8 mg of sl NTG was given. The 3D data set was reconstructed in 5% intervals of the 67-82 % of the R-R cycle. Diastolic phases were analyzed on a dedicated work station using MPR, MIP and VRT modes. The patient received 80 cc of contrast.  Aorta: Normal size.  No calcifications.  No dissection.  Aortic Valve:  Trileaflet.  No calcifications.  Coronary Arteries:  Normal coronary origin.  Right dominance.  RCA is a large dominant artery that gives rise to PDA and PLA. There is no plaque.  Left main is a large artery that gives rise to LAD and LCX arteries.  LAD is a large vessel that has no plaque.  LCX is a non-dominant artery that gives rise to one large OM1 branch. There is no plaque.  Coronary Calcium Score:  Left main: 0  Left anterior descending artery: 0  Left circumflex artery: 0  Right coronary artery: 0  Total: 0  Percentile: 0  Other findings:  Normal pulmonary vein drainage into the left atrium.  Normal left atrial appendage without a thrombus.  Normal size of the pulmonary artery.  History of mitral valve replacement - mechanical valve seated in the mitral position.  IMPRESSION: 1. Coronary calcium score of 0. This was 0 percentile for age and sex matched control.  2. Normal coronary origin with right dominance.  3. CAD-RADS 0. No evidence of CAD (0%). Consider non-atherosclerotic causes of chest pain.  The noncardiac portion  of this study will be interpreted in separate report by the radiologist.  Electronically Signed: By: Thomasene Ripple D.O. On: 03/02/2022 17:05          Risk Assessment/Calculations:    CHA2DS2-VASc Score = 1   This indicates a 0.6% annual risk of stroke. The patient's score is based upon: CHF History: 1 HTN History: 0 Diabetes History: 0 Stroke History: 0 Vascular Disease History: 0 Age Score: 0 Gender Score: 0             Physical Exam:   VS:  BP 118/76   Pulse 84   Ht 6\' 2"  (1.88 m)   Wt 197 lb (89.4 kg)   SpO2 98%   BMI 25.29 kg/m    Wt Readings from Last 3 Encounters:  09/09/23 197 lb (89.4 kg)  07/19/23 192 lb (87.1 kg)  07/10/23 192 lb 9.6 oz (87.4 kg)    GEN: Well nourished, well developed in no acute distress. Sitting comfortably on the exam table  NECK: No JVD CARDIAC: RRR, no murmurs, rubs, gallops. Mechanica click present over mitral valve  RESPIRATORY:  Clear to auscultation without rales, wheezing or rhonchi. Normal work of breathing on room air  ABDOMEN: Soft, non-tender, non-distended EXTREMITIES:  No edema in BLE; No deformity   ASSESSMENT AND PLAN: .    History of mechanical MVR  -Patient underwent mitral valve replacement with a Sorin [employer] bileaflet mechanical valve in 05/2019. Also followed by [facility].  -Most recent echocardiogram from 07/26/2023 was consistent with normal structure and function of the mitral valve prosthesis (mean gradient 5.0, trivial MR.) -Patient on Coumadin.  Followed by our Coumadin clinic.  INR goal is 2.5-3.5. Denies bleeding and reports that INR has been stable     Chronic diastolic heart failure -Most recent echocardiogram from 07/2023 showed EF 55-60%, normal RV function -Patient currently on Lasix 40 mg daily as needed for fluid retention.  Has not needed lasix for the past 2 weeks. - Euvolemic on exam today. Denies ankle edema, shortness of breath  - Continue lasix 40 mg PRN  - K 3.9, creatinine 1.06 in 06/2023     Chest Tightness - Resolved  - Coronary CTA from 02/2022 showed coronary calcium score of 0  - At his last office visit, patient reported that he had been having chest tightness on exertion. He also noticed chest tightness when he felt volume overloaded. - Nuclear stress test from 07/2023 was a normal, low risk study without ischemia or infarction  - Patient reports that symptoms have subsided since 07/2023 nuclear stress test. No further testing necessary at this time    Pulmonary HTN - prior to MVR, patient had significant pulmonary artery HTN. Did improve when he had his mitral valve repaired  - Echo from 01/2022 showed normal pulmonary artery systolic pressure. More recent echo from 07/2023 showed normal RV size, normal RV wall thickness, normal RV function, no TR. This suggests normal PA pressure  - Patient denies significant shortness of breath. Is euvolemic on exam today    Postop atrial fibrillation Palpitations  -Patient did have atrial fibrillation after mitral valve replacement.  He converted on IV amiodarone and has not had know recurrence of A-fib since - Recent monitor from 07/2023 showed no afib/flutter. Showed NSR with 1 run of SVT that only lasted 4 beats, rare ectopy. No afib/flutter noted  -On Coumadin as above for mechanical mitral valve - CHADS-VASc 1. Followed by coumadin clinic    Dispo: Follow up in 6 months with Dr. Allyson Sabal   Signed, Jonita Albee, PA-C

## 2023-09-01 LAB — POCT INR: INR: 2.9 (ref 2.0–3.0)

## 2023-09-02 ENCOUNTER — Ambulatory Visit (INDEPENDENT_AMBULATORY_CARE_PROVIDER_SITE_OTHER): Payer: 59 | Admitting: Cardiology

## 2023-09-02 ENCOUNTER — Telehealth: Payer: Self-pay

## 2023-09-02 DIAGNOSIS — Z5181 Encounter for therapeutic drug level monitoring: Secondary | ICD-10-CM

## 2023-09-02 DIAGNOSIS — Z954 Presence of other heart-valve replacement: Secondary | ICD-10-CM

## 2023-09-02 NOTE — Telephone Encounter (Signed)
Lpm to check INR 

## 2023-09-09 ENCOUNTER — Encounter: Payer: Self-pay | Admitting: Cardiology

## 2023-09-09 ENCOUNTER — Ambulatory Visit: Payer: 59 | Attending: Cardiology | Admitting: Cardiology

## 2023-09-09 VITALS — BP 118/76 | HR 84 | Ht 74.0 in | Wt 197.0 lb

## 2023-09-09 DIAGNOSIS — Z954 Presence of other heart-valve replacement: Secondary | ICD-10-CM

## 2023-09-09 DIAGNOSIS — R0789 Other chest pain: Secondary | ICD-10-CM | POA: Diagnosis not present

## 2023-09-09 DIAGNOSIS — I5032 Chronic diastolic (congestive) heart failure: Secondary | ICD-10-CM | POA: Diagnosis not present

## 2023-09-09 DIAGNOSIS — Z7901 Long term (current) use of anticoagulants: Secondary | ICD-10-CM | POA: Diagnosis not present

## 2023-09-09 DIAGNOSIS — I480 Paroxysmal atrial fibrillation: Secondary | ICD-10-CM

## 2023-09-09 NOTE — Patient Instructions (Signed)
Medication Instructions:  No changes *If you need a refill on your cardiac medications before your next appointment, please call your pharmacy*   Lab Work: No labs   Testing/Procedures: No testing   Follow-Up: At [facility], you and your health needs are our priority.  As part of our continuing mission to provide you with exceptional heart care, we have created designated Provider Care Teams.  These Care Teams include your primary Cardiologist (physician) and Advanced Practice Providers (APPs -  Physician Assistants and Nurse Practitioners) who all work together to provide you with the care you need, when you need it.  We recommend signing up for the patient portal called "MyChart".  Sign up information is provided on this After Visit Summary.  MyChart is used to connect with patients for Virtual Visits (Telemedicine).  Patients are able to view lab/test results, encounter notes, upcoming appointments, etc.  Non-urgent messages can be sent to your provider as well.   To learn more about what you can do with MyChart, go to [email or web address].    Your next appointment:   6 month(s)  Provider:   Nanetta Batty, MD

## 2023-09-16 ENCOUNTER — Telehealth: Payer: Self-pay

## 2023-09-16 LAB — POCT INR: INR: 3.2 — AB (ref 2.0–3.0)

## 2023-09-16 NOTE — Telephone Encounter (Signed)
Lpm to check INR 

## 2023-09-17 ENCOUNTER — Ambulatory Visit (INDEPENDENT_AMBULATORY_CARE_PROVIDER_SITE_OTHER): Payer: 59 | Admitting: *Deleted

## 2023-09-17 DIAGNOSIS — I050 Rheumatic mitral stenosis: Secondary | ICD-10-CM

## 2023-09-17 DIAGNOSIS — Z954 Presence of other heart-valve replacement: Secondary | ICD-10-CM

## 2023-09-17 DIAGNOSIS — Z5181 Encounter for therapeutic drug level monitoring: Secondary | ICD-10-CM | POA: Diagnosis not present

## 2023-09-17 NOTE — Patient Instructions (Signed)
Description   Spoke to patient and instructed to continue taking Warfarin 1 tablet daily. Please call for any upcoming procedures. Recheck INR in 2 weeks. *Self Tester*  Coumadin Clinic [phone].

## 2023-09-24 ENCOUNTER — Other Ambulatory Visit: Payer: Self-pay | Admitting: Cardiovascular Disease

## 2023-09-24 DIAGNOSIS — Z954 Presence of other heart-valve replacement: Secondary | ICD-10-CM

## 2023-09-24 NOTE — Telephone Encounter (Signed)
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

## 2023-09-29 LAB — POCT INR: INR: 3.1 — AB (ref 2.0–3.0)

## 2023-09-30 ENCOUNTER — Ambulatory Visit (INDEPENDENT_AMBULATORY_CARE_PROVIDER_SITE_OTHER): Payer: 59 | Admitting: Cardiology

## 2023-09-30 DIAGNOSIS — Z5181 Encounter for therapeutic drug level monitoring: Secondary | ICD-10-CM

## 2023-09-30 DIAGNOSIS — Z954 Presence of other heart-valve replacement: Secondary | ICD-10-CM | POA: Diagnosis not present

## 2023-10-01 ENCOUNTER — Telehealth: Payer: Self-pay | Admitting: *Deleted

## 2023-10-01 NOTE — Telephone Encounter (Signed)
Pt called to report that he is starting zpak and prednisone. Advised that prednisone does interact with warfarin and when taking the initial doses of the zpak it can affect INR at times. He does not know the dose of prednisone and advised to call back with that. Also advised will need to check his INR on Friday but need the dose of prednisone as well so we can know in the event we need to alter the warfarin dose. He verbalized understanding and states he will call back once picked up.

## 2023-10-04 ENCOUNTER — Ambulatory Visit (INDEPENDENT_AMBULATORY_CARE_PROVIDER_SITE_OTHER): Payer: 59 | Admitting: *Deleted

## 2023-10-04 DIAGNOSIS — I050 Rheumatic mitral stenosis: Secondary | ICD-10-CM

## 2023-10-04 DIAGNOSIS — Z5181 Encounter for therapeutic drug level monitoring: Secondary | ICD-10-CM

## 2023-10-04 DIAGNOSIS — Z954 Presence of other heart-valve replacement: Secondary | ICD-10-CM | POA: Diagnosis not present

## 2023-10-04 LAB — POCT INR: INR: 4.4 — AB (ref 2.0–3.0)

## 2023-10-04 NOTE — Patient Instructions (Signed)
Description   Spoke to patient and instructed to not take any warfarin today then continue taking Warfarin 1 tablet daily. (Finish Prednisone Taper on 10/06/23) Please call for any upcoming procedures. Recheck INR in 1 week-post prednisone. *Self Tester*  Coumadin Clinic [phone].

## 2023-10-14 ENCOUNTER — Ambulatory Visit (INDEPENDENT_AMBULATORY_CARE_PROVIDER_SITE_OTHER): Payer: 59 | Admitting: Cardiology

## 2023-10-14 DIAGNOSIS — Z954 Presence of other heart-valve replacement: Secondary | ICD-10-CM

## 2023-10-14 DIAGNOSIS — Z5181 Encounter for therapeutic drug level monitoring: Secondary | ICD-10-CM

## 2023-10-14 LAB — POCT INR: INR: 3.2 — AB (ref 2.0–3.0)

## 2023-10-28 ENCOUNTER — Telehealth: Payer: Self-pay

## 2023-10-28 NOTE — Telephone Encounter (Signed)
INR due. Called pt, no answer. Unable to leave voicemail, mailbox full.

## 2023-10-29 ENCOUNTER — Telehealth: Payer: Self-pay

## 2023-10-29 ENCOUNTER — Ambulatory Visit (INDEPENDENT_AMBULATORY_CARE_PROVIDER_SITE_OTHER): Payer: 59 | Admitting: Cardiology

## 2023-10-29 DIAGNOSIS — Z5181 Encounter for therapeutic drug level monitoring: Secondary | ICD-10-CM

## 2023-10-29 DIAGNOSIS — Z954 Presence of other heart-valve replacement: Secondary | ICD-10-CM | POA: Diagnosis not present

## 2023-10-29 LAB — POCT INR: INR: 3.5 — AB (ref 2.0–3.0)

## 2023-10-29 NOTE — Telephone Encounter (Signed)
I tried calling patient, but mailbox is full.

## 2023-10-30 NOTE — Patient Instructions (Signed)
 Description   Spoke to patient and instructed to continue taking Warfarin 1 tablet daily. Please call for any upcoming procedures. Recheck INR in 2 weeks. *Self Tester*  Coumadin Clinic [phone].

## 2023-11-10 LAB — POCT INR: INR: 2 (ref 2.0–3.0)

## 2023-11-12 ENCOUNTER — Ambulatory Visit (INDEPENDENT_AMBULATORY_CARE_PROVIDER_SITE_OTHER): Payer: 59 | Admitting: *Deleted

## 2023-11-12 DIAGNOSIS — Z5181 Encounter for therapeutic drug level monitoring: Secondary | ICD-10-CM | POA: Diagnosis not present

## 2023-11-12 DIAGNOSIS — Z954 Presence of other heart-valve replacement: Secondary | ICD-10-CM

## 2023-11-12 DIAGNOSIS — I050 Rheumatic mitral stenosis: Secondary | ICD-10-CM | POA: Diagnosis not present

## 2023-11-12 NOTE — Patient Instructions (Addendum)
 Description   Spoke to patient and instructed that since you took 1.5 tablets of warfarin on Sunday then continue taking Warfarin 1 tablet daily.  Please call for any upcoming procedures. Recheck INR in 2 weeks *Self Tester*  Coumadin  Clinic [phone].

## 2023-11-25 ENCOUNTER — Ambulatory Visit (INDEPENDENT_AMBULATORY_CARE_PROVIDER_SITE_OTHER): Payer: 59

## 2023-11-25 DIAGNOSIS — Z5181 Encounter for therapeutic drug level monitoring: Secondary | ICD-10-CM

## 2023-11-25 LAB — POCT INR: INR: 2.1 (ref 2.0–3.0)

## 2023-11-25 NOTE — Patient Instructions (Signed)
 Description   Spoke to patient and instructed to take 1.5 tablets today and then continue taking Warfarin 1 tablet daily.  Please call for any upcoming procedures.  Recheck INR in 2 weeks *Self Tester*  Coumadin Clinic [phone].

## 2023-12-09 ENCOUNTER — Telehealth: Payer: Self-pay

## 2023-12-09 NOTE — Telephone Encounter (Signed)
Lpm to check INR

## 2023-12-10 ENCOUNTER — Ambulatory Visit (INDEPENDENT_AMBULATORY_CARE_PROVIDER_SITE_OTHER): Payer: 59 | Admitting: Cardiology

## 2023-12-10 ENCOUNTER — Telehealth: Payer: Self-pay | Admitting: *Deleted

## 2023-12-10 DIAGNOSIS — Z5181 Encounter for therapeutic drug level monitoring: Secondary | ICD-10-CM

## 2023-12-10 DIAGNOSIS — Z954 Presence of other heart-valve replacement: Secondary | ICD-10-CM

## 2023-12-10 LAB — POCT INR: INR: 3.2 — AB (ref 2.0–3.0)

## 2023-12-10 NOTE — Telephone Encounter (Signed)
Called pt since INR is overdue; there was no answer so left a message for him to call back. Will await and follow up.   Pt needs to perform INR testing and call company then call the Clinic.

## 2023-12-23 ENCOUNTER — Telehealth: Payer: Self-pay

## 2023-12-23 NOTE — Telephone Encounter (Signed)
 INR due. Called pt, no answer. Left message eon voicemail.

## 2023-12-24 ENCOUNTER — Telehealth: Payer: Self-pay

## 2023-12-24 ENCOUNTER — Telehealth: Payer: Self-pay | Admitting: *Deleted

## 2023-12-24 LAB — POCT INR: INR: 2.5 (ref 2.0–3.0)

## 2023-12-24 NOTE — Telephone Encounter (Signed)
Called pt since INR is overdue; there was no answer so left a message. INR was due on 12/23/23 and we have been trying to reach him regarding this. Will await a call back/follow up.

## 2023-12-24 NOTE — Telephone Encounter (Signed)
Lpm to check INR today

## 2023-12-25 ENCOUNTER — Ambulatory Visit (INDEPENDENT_AMBULATORY_CARE_PROVIDER_SITE_OTHER): Payer: 59

## 2023-12-25 DIAGNOSIS — Z954 Presence of other heart-valve replacement: Secondary | ICD-10-CM | POA: Diagnosis not present

## 2023-12-25 DIAGNOSIS — Z5181 Encounter for therapeutic drug level monitoring: Secondary | ICD-10-CM | POA: Diagnosis not present

## 2023-12-25 NOTE — Patient Instructions (Signed)
Description   Spoke to patient and instructed to take 1.5 tablets today and then continue taking Warfarin 1 tablet daily.  Please call for any upcoming procedures.  Recheck INR in 2 weeks *Self Tester*  Coumadin Clinic [phone].

## 2024-01-07 ENCOUNTER — Telehealth: Payer: Self-pay

## 2024-01-07 LAB — POCT INR: INR: 2.5 (ref 2.0–3.0)

## 2024-01-07 NOTE — Telephone Encounter (Signed)
 Lpm to check INR

## 2024-01-08 ENCOUNTER — Ambulatory Visit (INDEPENDENT_AMBULATORY_CARE_PROVIDER_SITE_OTHER): Payer: Self-pay

## 2024-01-08 DIAGNOSIS — Z5181 Encounter for therapeutic drug level monitoring: Secondary | ICD-10-CM | POA: Diagnosis not present

## 2024-01-08 NOTE — Patient Instructions (Signed)
 Description   Spoke to patient and instructed to take 1.5 tablets today and then continue taking Warfarin 1 tablet daily.  Please call for any upcoming procedures.  Recheck INR in 2 weeks *Self Tester*  Coumadin Clinic [phone].

## 2024-01-21 ENCOUNTER — Telehealth: Payer: Self-pay

## 2024-01-21 NOTE — Telephone Encounter (Signed)
 Lpm to check INR

## 2024-01-22 ENCOUNTER — Ambulatory Visit (INDEPENDENT_AMBULATORY_CARE_PROVIDER_SITE_OTHER)

## 2024-01-22 ENCOUNTER — Telehealth: Payer: Self-pay | Admitting: *Deleted

## 2024-01-22 DIAGNOSIS — Z5181 Encounter for therapeutic drug level monitoring: Secondary | ICD-10-CM

## 2024-01-22 LAB — POCT INR: INR: 3.2 — AB (ref 2.0–3.0)

## 2024-01-22 NOTE — Patient Instructions (Signed)
 Description   Spoke to patient and instructed to continue taking Warfarin 1 tablet daily. Please call for any upcoming procedures. Recheck INR in 2 weeks. *Self Tester*  Coumadin Clinic [phone].

## 2024-01-22 NOTE — Telephone Encounter (Signed)
 Called pt since INR is overdue. There was no answer so left her a message for him to call back regarding this. Will await a call back and follow up.

## 2024-02-04 ENCOUNTER — Telehealth: Payer: Self-pay | Admitting: *Deleted

## 2024-02-04 LAB — POCT INR: INR: 3.4 — AB (ref 2.0–3.0)

## 2024-02-04 NOTE — Telephone Encounter (Signed)
 Called pt since INR is due; there was no answer and voicemail is full. Will try back a t later date/time.

## 2024-02-05 ENCOUNTER — Ambulatory Visit (INDEPENDENT_AMBULATORY_CARE_PROVIDER_SITE_OTHER)

## 2024-02-05 DIAGNOSIS — Z5181 Encounter for therapeutic drug level monitoring: Secondary | ICD-10-CM | POA: Diagnosis not present

## 2024-02-05 NOTE — Patient Instructions (Signed)
 Description   Spoke to patient and instructed to continue taking Warfarin 1 tablet daily. Please call for any upcoming procedures. Recheck INR in 2 weeks. *Self Tester*  Coumadin Clinic [phone].

## 2024-02-17 LAB — POCT INR: INR: 3.2 — AB (ref 2.0–3.0)

## 2024-02-18 ENCOUNTER — Telehealth: Payer: Self-pay | Admitting: *Deleted

## 2024-02-18 ENCOUNTER — Ambulatory Visit (INDEPENDENT_AMBULATORY_CARE_PROVIDER_SITE_OTHER): Admitting: Cardiology

## 2024-02-18 DIAGNOSIS — Z954 Presence of other heart-valve replacement: Secondary | ICD-10-CM

## 2024-02-18 DIAGNOSIS — Z5181 Encounter for therapeutic drug level monitoring: Secondary | ICD-10-CM | POA: Diagnosis not present

## 2024-02-18 NOTE — Telephone Encounter (Signed)
 Called pt since INR is overdue; there was no answer and voicemail is full. Will try back at a later time.

## 2024-03-01 LAB — POCT INR: INR: 2.4 (ref 2.0–3.0)

## 2024-03-02 ENCOUNTER — Ambulatory Visit (INDEPENDENT_AMBULATORY_CARE_PROVIDER_SITE_OTHER)

## 2024-03-02 DIAGNOSIS — Z5181 Encounter for therapeutic drug level monitoring: Secondary | ICD-10-CM | POA: Diagnosis not present

## 2024-03-02 NOTE — Telephone Encounter (Signed)
 This encounter was created in error - please disregard.

## 2024-03-02 NOTE — Patient Instructions (Signed)
 Description   Spoke to patient and instructed to take 1.5 tablets today and then continue taking Warfarin 1 tablet daily.  Please call for any upcoming procedures.  Recheck INR in 2 weeks *Self Tester*  Coumadin Clinic [phone].

## 2024-03-16 LAB — POCT INR: INR: 3.2 — AB (ref 2.0–3.0)

## 2024-03-17 ENCOUNTER — Ambulatory Visit (INDEPENDENT_AMBULATORY_CARE_PROVIDER_SITE_OTHER): Payer: Self-pay | Admitting: Cardiology

## 2024-03-17 DIAGNOSIS — Z954 Presence of other heart-valve replacement: Secondary | ICD-10-CM

## 2024-03-17 DIAGNOSIS — Z5181 Encounter for therapeutic drug level monitoring: Secondary | ICD-10-CM

## 2024-03-25 ENCOUNTER — Telehealth: Payer: Self-pay

## 2024-03-25 NOTE — Telephone Encounter (Signed)
   Name: Darren Haynes  DOB: [birth year]  MRN: [id number]  Primary Cardiologist: [name], MD  Chart reviewed as part of pre-operative protocol coverage. Because of Darren Haynes's past medical history and time since last visit, he will require a follow-up in-office visit in order to better assess preoperative cardiovascular risk.Pt is overdue for 6 months follow-up.   Pre-op covering staff: - Please schedule appointment and call patient to inform them. If patient already had an upcoming appointment within acceptable timeframe, please add "pre-op clearance" to the appointment notes so provider is aware. - Please contact requesting surgeon's office via preferred method (i.e, phone, fax) to inform them of need for appointment prior to surgery.  This message will also be routed to pharmacy pool for input on holding warfarin as requested below so that this information is available to the clearing provider at time of patient's appointment.   Jude Norton, NP  03/25/2024, 1:40 PM

## 2024-03-25 NOTE — Telephone Encounter (Signed)
   Pre-operative Risk Assessment    Patient Name: Darren Haynes  DOB: 1986/09/19 MRN: [id number]   Date of last office visit: 09/09/23 Liane Redman, PA-C Date of next office visit: NONE   Request for Surgical Clearance    Procedure:  ULTRASOUND GUIDED THYROID  BIPSY  Date of Surgery:  Clearance 04/13/24                                Surgeon:  NOT INDICATED Surgeon's Group or Practice Name:  [facility] Phone number:  [phone] Fax number:  [phone]   Type of Clearance Requested:   - Medical  - Pharmacy:  Hold Warfarin (Coumadin ) 5 DAYS PRIOR TO PROCEDURE (LAST DOSE 04/07/24) IF TAKING lOVENOX  BRIDGE PLEASE HOLD 12 HOURS PRIOR TO PROCEDURE.   Type of Anesthesia:  Not Indicated   Additional requests/questions:    Signed, Collin Deal   03/25/2024, 12:25 PM

## 2024-03-25 NOTE — Telephone Encounter (Signed)
 S/w the pt and he has been scheduled to see Charles Connor, NP 03/27/24 @ 3:10 for preop clearance. I will update all parties involved.

## 2024-03-26 NOTE — Progress Notes (Signed)
 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  Darren Haynes was seen initially by Darren Haynes in 2020 when a 2D echo was performed that demonstrated normal LV function but peak mitral gradient of 47 mmHg with mean gradient of 29 mmHg.  TEE was completed that demonstrated classic rheumatic MS mean gradient of 15 mmHg.  Left and right heart cath were completed revealed significant MV gradient with normal coronaries.  He was referred to Darren Haynes at [facility] who performed successful mitral balloon valvuloplasty on 12/2018.  He unfortunately presented back 2 months later with progressive dyspnea and lower extremity edema.  He underwent repeat TEE on 03/27/2019 that showed moderate to severe MS and echo completed showing EF of 45-50%.  He eventually underwent MVR replacement with [employer] bileaflet mechanical valve 29 mm on 05/14/2019.  Postoperative course was complicated by AF patient was treated with IV amiodarone .  He was discharged with 30-day event monitor with amiodarone  and eventually  discontinued.  A total left hip replacement due to avascular necrosis postop course complicated by bacteremia requiring PICC placement and antibiotics.  He was seen in the ED and [facility] on 11/29/2019 with complaint of chest pain CT was completed with no evidence of PE.  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  He completed a 2D echo on 01/22/2022 that showed EF of 55 to 60% regional wall abnormalities and normal RV function.  He underwent a coronary CTA that showed a calcium score of 0 with no evidence of CAD.  He was seen on 07/10/2023 by Darren Redman, PA with complaint of chest tightness and shortness of breath.  He underwent a nuclear stress test 07/19/2023 that showed no ischemia.  Repeat 2D echo showed EF of 55 to 60% with no RWMA and normal RV function and stable mechanical mitral valve present.  He was seen most recently on 09/09/2023 for follow-up.  During visit he reported no complaints of chest pain or palpitations.  He was tolerating warfarin due to mechanical valve and reported not needing to take Lasix  over the past 2 weeks due to lack of swelling.  He is scheduled to undergo a ultrasound guided thyroid  biopsy on 04/13/2024.   Patient denies chest pain, palpitations, dyspnea, PND, orthopnea, nausea, vomiting, dizziness, syncope, edema, weight gain, or early satiety.   Discussed the use of AI scribe software for clinical note transcription with the  patient, who gave verbal consent to proceed.  History of Present Illness    Notes: Scheduled for ultrasound-guided thyroid  biopsy  Patient was given guidance to hold warfarin 5 days prior to procedure and will need Lovenox  bridging around procedure.  He is scheduled to follow-up at the INR clinic for instructions on 03/30/2024.   Review of Systems  Please see the history of present illness.    All other systems reviewed and are otherwise negative except as  noted above.  Physical Exam     Wt Readings from Last 3 Encounters:  09/09/23 197 lb (89.4 kg)  07/19/23 192 lb (87.1 kg)  07/10/23 192 lb 9.6 oz (87.4 kg)   ON:GEXBM were no vitals filed for this visit.,There is no height or weight on file to calculate BMI. GEN: Well nourished, well developed in no acute distress Neck: No JVD; No carotid bruits Pulmonary: Clear to auscultation without rales, wheezing or rhonchi  Cardiovascular: Normal rate. Regular rhythm. Normal S1. Normal S2.   Murmurs: There is no murmur.  ABDOMEN: Soft, non-tender, non-distended EXTREMITIES:  No edema; No deformity   EKG/LABS/ Recent Cardiac Studies   ECG personally reviewed by me today -   Risk Assessment/Calculations:         Lab Results  Component Value Date   WBC 10.2 05/07/2022   HGB 14.0 05/07/2022   HCT 43.3 05/07/2022   MCV 89.5 05/07/2022   PLT 154 05/07/2022   Lab Results  Component Value Date   CREATININE 1.06 07/10/2023   BUN 12 07/10/2023   NA 136 07/10/2023   K 3.9 07/10/2023   CL 98 07/10/2023   CO2 24 07/10/2023   No results found for: "CHOL", "HDL", "LDLCALC", "LDLDIRECT", "TRIG", "CHOLHDL"  Lab Results  Component Value Date   HGBA1C 4.0 (L) 05/11/2019   Assessment & Plan    Assessment and Plan Assessment & Plan     1.  Preoperative clearance: - Patient's RCRI score is 6.6%  2.  History of rheumatic mitral valve stenosis: -s/p  Sorin [employer] bileaflet mechanical valve in 05/2019 at [facility] and currently on warfarin therapy.  3.  HFpEF:   4.  Postoperative AF:      Disposition: Follow-up with Darren Portal, MD or APP in  months    Signed, Darren Haynes, Darren Cast, NP 03/26/2024, 7:20 PM [facility]

## 2024-03-26 NOTE — Telephone Encounter (Signed)
 Pt scheduled to check INR on Monday, 03/30/24. Will provide pt with Lovenox  bridging instruction at this time for procedure that is scheduled for 04/13/24.

## 2024-03-26 NOTE — Telephone Encounter (Signed)
 Patient with diagnosis of MVR with mechanical valve on warfarin for anticoagulation.    Procedure: ULTRASOUND GUIDED THYROID  BIPSY  Date of procedure: 04/13/24   CrCl 117 ml/min Platelet count 168K   Per office protocol, patient can hold warfarin for 5 days prior to procedure.    Patient WILL  need bridging with Lovenox  (enoxaparin ) around procedure.  **This guidance is not considered finalized until pre-operative APP has relayed final recommendations.**

## 2024-03-27 ENCOUNTER — Ambulatory Visit: Attending: Nurse Practitioner | Admitting: Nurse Practitioner

## 2024-03-27 DIAGNOSIS — I5032 Chronic diastolic (congestive) heart failure: Secondary | ICD-10-CM

## 2024-03-27 DIAGNOSIS — Z952 Presence of prosthetic heart valve: Secondary | ICD-10-CM

## 2024-03-27 DIAGNOSIS — I480 Paroxysmal atrial fibrillation: Secondary | ICD-10-CM

## 2024-03-27 DIAGNOSIS — I050 Rheumatic mitral stenosis: Secondary | ICD-10-CM

## 2024-03-31 ENCOUNTER — Telehealth: Payer: Self-pay | Admitting: *Deleted

## 2024-03-31 NOTE — Telephone Encounter (Signed)
 Called pt since INR was due on 03/30/24; there was no answer and voicemail is full.

## 2024-04-01 ENCOUNTER — Telehealth: Payer: Self-pay | Admitting: *Deleted

## 2024-04-01 NOTE — Telephone Encounter (Signed)
 Called pt since INR is overdue; there was no answer and no voicemail.

## 2024-04-03 ENCOUNTER — Telehealth: Payer: Self-pay | Admitting: *Deleted

## 2024-04-03 NOTE — Telephone Encounter (Signed)
 Called pt since INR is overdue; there was no answer and no voicemail.

## 2024-04-07 ENCOUNTER — Telehealth: Payer: Self-pay

## 2024-04-07 NOTE — Telephone Encounter (Signed)
 I tried calling patient to arrange Lovenox  Bridging for upcoming procedure 6/2, but mailbox full.  It looks like staff tried calling him all last week.

## 2024-04-08 ENCOUNTER — Telehealth: Payer: Self-pay

## 2024-04-08 NOTE — Telephone Encounter (Signed)
 Pt has scheduled procedure on 6/2 and per cardiac clearance, pt needs Lovenox  bridge. Coumadin  Clinic has attempted to call pt multiple times to coordinate Lovenox  bridge and provide pt with instructions. I attempted to call pt again today, no answer and voice mailbox is full.

## 2024-04-10 ENCOUNTER — Telehealth: Payer: Self-pay

## 2024-04-10 NOTE — Telephone Encounter (Signed)
 Called and spoke with pt concerning upcoming procedure on Monday, 04/13/24 and the need for Lovenox  bridging. Pt states he has been on a cruise and did not have any phone signal. Pt states he is going to have the procedure rescheduled to a later date. I did make him aware our coumadin  clinic will need to know of procedure date atleast 1 week in advance to provide him with appropriate Lovenox  bridging instructions. Pt states he will call [facility] to have procedure rescheduled and then also check INR by Monday morning and call our coumadin  clinic with information/INR results.

## 2024-04-12 ENCOUNTER — Other Ambulatory Visit: Payer: Self-pay | Admitting: Cardiovascular Disease

## 2024-04-12 DIAGNOSIS — Z954 Presence of other heart-valve replacement: Secondary | ICD-10-CM

## 2024-04-13 ENCOUNTER — Telehealth: Payer: Self-pay

## 2024-04-13 ENCOUNTER — Ambulatory Visit (INDEPENDENT_AMBULATORY_CARE_PROVIDER_SITE_OTHER): Payer: Self-pay

## 2024-04-13 DIAGNOSIS — Z5181 Encounter for therapeutic drug level monitoring: Secondary | ICD-10-CM | POA: Diagnosis not present

## 2024-04-13 LAB — POCT INR: INR: 4.5 — AB (ref 2.0–3.0)

## 2024-04-13 NOTE — Patient Instructions (Signed)
 Description   Spoke to patient and instructed to HOLD today's dose and then continue taking Warfarin 1 tablet daily.  Please call for any upcoming procedures - make coumadin  clinic aware when procedure at [facility] is rescheduled  Recheck INR in 1 week Coumadin  Clinic [phone]

## 2024-04-13 NOTE — Telephone Encounter (Signed)
 INR overdue. Called to, no answer. Voice mailbox full, unable to leave voicemail.

## 2024-04-15 ENCOUNTER — Telehealth: Payer: Self-pay | Admitting: *Deleted

## 2024-04-15 NOTE — Telephone Encounter (Signed)
 Patient called and stated that he has now been prescribed a zpak and prednisone  30mg  x 6 days. He stated he was given IM rocephin and IM steroid yesterday as well. Advised that the by mouth prednisone  will interact with his warfarin and since he will be taking 30mg  of it for 6 days, will need to reduce his warfarin to 2.5mg  today, 2.5mg  Thursday, and 2.5mg  Friday then resume normal warfarin doseage and recheck INR on Monday. Also, advised can have some green leafy veggies. He verbalized understanding.

## 2024-04-20 ENCOUNTER — Ambulatory Visit (INDEPENDENT_AMBULATORY_CARE_PROVIDER_SITE_OTHER): Admitting: *Deleted

## 2024-04-20 ENCOUNTER — Telehealth: Payer: Self-pay | Admitting: *Deleted

## 2024-04-20 DIAGNOSIS — Z954 Presence of other heart-valve replacement: Secondary | ICD-10-CM

## 2024-04-20 DIAGNOSIS — Z5181 Encounter for therapeutic drug level monitoring: Secondary | ICD-10-CM

## 2024-04-20 DIAGNOSIS — I050 Rheumatic mitral stenosis: Secondary | ICD-10-CM | POA: Diagnosis not present

## 2024-04-20 LAB — POCT INR: INR: 2.1 (ref 2.0–3.0)

## 2024-04-20 NOTE — Telephone Encounter (Signed)
 Called pt since INR is due; he states he will get it done soon.

## 2024-04-20 NOTE — Patient Instructions (Signed)
 Description   Spoke to patient and instructed to take 1.5 tablets of warfarin today then continue taking Warfarin 1 tablet daily. PENDING PROCEDURE 05/05/24-NEEDS BRIDGE. Please call for any upcoming procedures - make coumadin  clinic aware when procedure at [facility] is rescheduled  Recheck INR in 1 week Coumadin  Clinic [phone]

## 2024-04-27 ENCOUNTER — Ambulatory Visit (INDEPENDENT_AMBULATORY_CARE_PROVIDER_SITE_OTHER)

## 2024-04-27 DIAGNOSIS — Z952 Presence of prosthetic heart valve: Secondary | ICD-10-CM

## 2024-04-27 LAB — POCT INR: INR: 3.5 — AB (ref 2.0–3.0)

## 2024-04-27 MED ORDER — ENOXAPARIN SODIUM 80 MG/0.8ML IJ SOSY: 80.0000 mg | PREFILLED_SYRINGE | Freq: Two times a day (BID) | INTRAMUSCULAR | 1 refills | Status: AC

## 2024-04-27 NOTE — Patient Instructions (Signed)
 Description   Spoke to patient and instructed to continue taking Warfarin 1 tablet daily.  PENDING PROCEDURE 05/05/24 - On 04/30/24, Follow Pre/Post procedure instructions.  Recheck INR 1 week post procedure on 05/11/24 Please call for any upcoming procedures Coumadin  Clinic [phone]      6/18: Last dose of warfarin.  6/19: No warfarin or enoxaparin  (Lovenox ).  6/20: Inject enoxaparin  80mg  in the fatty abdominal tissue at least 2 inches from the belly button twice a day about 12 hours apart, 8am and 8pm rotate sites. No warfarin.  6/21: Inject enoxaparin  in the fatty tissue every 12 hours, 8am and 8pm. No warfarin.  6/22: Inject enoxaparin  in the fatty tissue every 12 hours, 8am and 8pm. No warfarin.  6/23: Inject enoxaparin  in the fatty tissue in the morning at 8 am (No PM dose). No warfarin.  6/24: Procedure Day - No enoxaparin  - Resume warfarin in the evening or as directed by doctor (take an extra half tablet with usual dose).   6/25: Resume enoxaparin  inject in the fatty tissue every 12 hours and take warfarin (take an extra half tablet with usual dose)  6/26: Inject enoxaparin  in the fatty tissue every 12 hours and take warfarin  6/27: Inject enoxaparin  in the fatty tissue every 12 hours and take warfarin  6/28: Inject enoxaparin  in the fatty tissue every 12 hours and take warfarin  6/29: Inject enoxaparin  in the fatty tissue every 12 hours and take warfarin  6/30: Inject enoxaparin  in fatty tissue every 12 hours and CHECK INR (REPORT INR RESULTS TO COUMADIN  CLINIC AT [phone])

## 2024-05-10 LAB — POCT INR: INR: 2.4 (ref 2.0–3.0)

## 2024-05-11 ENCOUNTER — Ambulatory Visit (INDEPENDENT_AMBULATORY_CARE_PROVIDER_SITE_OTHER)

## 2024-05-11 DIAGNOSIS — Z5181 Encounter for therapeutic drug level monitoring: Secondary | ICD-10-CM

## 2024-05-11 NOTE — Patient Instructions (Addendum)
 Description   (Pt states he stopped Lovenox  yesterday) Spoke to patient and instructed to continue taking Warfarin 1 tablet daily (did not boost since he is on Prednisone )  Recheck INR 1 week.  Please call for any upcoming procedures Coumadin  Clinic [phone]

## 2024-05-11 NOTE — Progress Notes (Signed)
Please see anticoagulation encounter.

## 2024-05-18 ENCOUNTER — Telehealth: Payer: Self-pay | Admitting: *Deleted

## 2024-05-18 ENCOUNTER — Ambulatory Visit (INDEPENDENT_AMBULATORY_CARE_PROVIDER_SITE_OTHER): Admitting: *Deleted

## 2024-05-18 DIAGNOSIS — I050 Rheumatic mitral stenosis: Secondary | ICD-10-CM

## 2024-05-18 DIAGNOSIS — Z5181 Encounter for therapeutic drug level monitoring: Secondary | ICD-10-CM | POA: Diagnosis not present

## 2024-05-18 DIAGNOSIS — Z954 Presence of other heart-valve replacement: Secondary | ICD-10-CM

## 2024-05-18 LAB — POCT INR: INR: 4.1 — AB (ref 2.0–3.0)

## 2024-05-18 NOTE — Progress Notes (Signed)
Please see anticoagulation encounter.

## 2024-05-18 NOTE — Telephone Encounter (Signed)
 Called patient since INR is due today; there was no answer and no voicemail set up. Will try back later.

## 2024-05-18 NOTE — Patient Instructions (Addendum)
 Description   Spoke to patient and instructed since still on prednisone  30mg  daily and start doxycycline  100mg  bid to not take any warfarin today, take 1 tablet Tuesday, 1/2 tablet Wednesday and, 1/2 tablet Thursday then Recheck INR on Friday.  Normal dose:  Warfarin 1 tablet daily. Please call for any upcoming procedures Coumadin  Clinic [phone]

## 2024-05-22 ENCOUNTER — Ambulatory Visit (INDEPENDENT_AMBULATORY_CARE_PROVIDER_SITE_OTHER)

## 2024-05-22 DIAGNOSIS — Z5181 Encounter for therapeutic drug level monitoring: Secondary | ICD-10-CM

## 2024-05-22 LAB — POCT INR: INR: 3.4 — AB (ref 2.0–3.0)

## 2024-05-22 NOTE — Progress Notes (Signed)
Please see anticoagulation encounter.

## 2024-05-22 NOTE — Patient Instructions (Signed)
 Description   *Started Etodolac x 10 days*  Spoke to patient and instructed resume taking 1 tablet daily.  Recheck INR in 1 week.  Please call for any upcoming procedures Coumadin  Clinic [phone]

## 2024-05-28 LAB — POCT INR: INR: 3.2 — AB (ref 2.0–3.0)

## 2024-05-29 ENCOUNTER — Ambulatory Visit (INDEPENDENT_AMBULATORY_CARE_PROVIDER_SITE_OTHER): Admitting: *Deleted

## 2024-05-29 DIAGNOSIS — Z5181 Encounter for therapeutic drug level monitoring: Secondary | ICD-10-CM

## 2024-05-29 DIAGNOSIS — Z954 Presence of other heart-valve replacement: Secondary | ICD-10-CM

## 2024-05-29 DIAGNOSIS — I050 Rheumatic mitral stenosis: Secondary | ICD-10-CM

## 2024-05-29 NOTE — Patient Instructions (Addendum)
 Description   Spoke to patient and instructed continue taking 1 tablet daily. Recheck INR in 2 weeks. Please call for any upcoming procedures Coumadin  Clinic [phone]

## 2024-05-29 NOTE — Progress Notes (Signed)
Please see anticoagulation encounter.

## 2024-06-12 ENCOUNTER — Ambulatory Visit (INDEPENDENT_AMBULATORY_CARE_PROVIDER_SITE_OTHER): Payer: Self-pay | Admitting: Cardiology

## 2024-06-12 DIAGNOSIS — Z5181 Encounter for therapeutic drug level monitoring: Secondary | ICD-10-CM | POA: Diagnosis not present

## 2024-06-12 DIAGNOSIS — Z954 Presence of other heart-valve replacement: Secondary | ICD-10-CM

## 2024-06-12 LAB — POCT INR: INR: 3.2 — AB (ref 2.0–3.0)

## 2024-06-12 NOTE — Progress Notes (Signed)
 INR 3.2. Please see anticoagulation encounter

## 2024-06-29 ENCOUNTER — Telehealth: Payer: Self-pay

## 2024-06-29 LAB — POCT INR: INR: 2.8 (ref 2.0–3.0)

## 2024-06-29 NOTE — Telephone Encounter (Signed)
 Lp's wife message since pt's mailbox is full to see if patient checked INR.

## 2024-06-30 ENCOUNTER — Ambulatory Visit (INDEPENDENT_AMBULATORY_CARE_PROVIDER_SITE_OTHER): Admitting: *Deleted

## 2024-06-30 DIAGNOSIS — Z954 Presence of other heart-valve replacement: Secondary | ICD-10-CM | POA: Diagnosis not present

## 2024-06-30 DIAGNOSIS — Z5181 Encounter for therapeutic drug level monitoring: Secondary | ICD-10-CM

## 2024-06-30 DIAGNOSIS — I349 Nonrheumatic mitral valve disorder, unspecified: Secondary | ICD-10-CM

## 2024-06-30 DIAGNOSIS — I050 Rheumatic mitral stenosis: Secondary | ICD-10-CM

## 2024-06-30 NOTE — Patient Instructions (Signed)
 Description   Spoke to patient and instructed to continue taking 1 tablet daily. Recheck INR in 2 weeks. Please call for any upcoming procedures Coumadin  Clinic [phone]

## 2024-06-30 NOTE — Progress Notes (Signed)
 INR-2.8; Spoke to patient and instructed to continue taking 1 tablet daily. Recheck INR in 2 weeks. Please call for any upcoming procedures Coumadin  Clinic [phone]

## 2024-07-13 LAB — POCT INR: INR: 2.8 (ref 2.0–3.0)

## 2024-07-14 ENCOUNTER — Ambulatory Visit (INDEPENDENT_AMBULATORY_CARE_PROVIDER_SITE_OTHER): Admitting: *Deleted

## 2024-07-14 DIAGNOSIS — I349 Nonrheumatic mitral valve disorder, unspecified: Secondary | ICD-10-CM | POA: Diagnosis not present

## 2024-07-14 DIAGNOSIS — Z954 Presence of other heart-valve replacement: Secondary | ICD-10-CM | POA: Diagnosis not present

## 2024-07-14 DIAGNOSIS — I050 Rheumatic mitral stenosis: Secondary | ICD-10-CM

## 2024-07-14 DIAGNOSIS — Z5181 Encounter for therapeutic drug level monitoring: Secondary | ICD-10-CM | POA: Diagnosis not present

## 2024-07-14 NOTE — Progress Notes (Signed)
 Description   INR-2.8; Spoke to patient and instructed to continue taking 1 tablet daily. Recheck INR in 2 weeks. Please call for any upcoming procedures Coumadin  Clinic [phone]

## 2024-07-14 NOTE — Patient Instructions (Signed)
 Description   INR-2.8; Spoke to patient and instructed to continue taking 1 tablet daily. Recheck INR in 2 weeks. Please call for any upcoming procedures Coumadin  Clinic [phone]

## 2024-07-27 ENCOUNTER — Ambulatory Visit (INDEPENDENT_AMBULATORY_CARE_PROVIDER_SITE_OTHER): Admitting: *Deleted

## 2024-07-27 DIAGNOSIS — Z5181 Encounter for therapeutic drug level monitoring: Secondary | ICD-10-CM

## 2024-07-27 DIAGNOSIS — I050 Rheumatic mitral stenosis: Secondary | ICD-10-CM

## 2024-07-27 DIAGNOSIS — Z954 Presence of other heart-valve replacement: Secondary | ICD-10-CM

## 2024-07-27 LAB — POCT INR: INR: 2.8 (ref 2.0–3.0)

## 2024-07-27 NOTE — Progress Notes (Signed)
 Description   INR-2.8; Spoke to patient and instructed to continue taking 1 tablet daily. Recheck INR in 2 weeks. Please call for any upcoming procedures Coumadin  Clinic [phone] *LET US  KNOW WHEN YOU START IVIG-NEED TO MONITOR CLOSELY*

## 2024-07-27 NOTE — Patient Instructions (Signed)
 Description   INR-2.8; Spoke to patient and instructed to continue taking 1 tablet daily. Recheck INR in 2 weeks. Please call for any upcoming procedures Coumadin  Clinic [phone] *LET US  KNOW WHEN YOU START IVIG-NEED TO MONITOR CLOSELY*

## 2024-08-10 ENCOUNTER — Ambulatory Visit (INDEPENDENT_AMBULATORY_CARE_PROVIDER_SITE_OTHER): Admitting: *Deleted

## 2024-08-10 DIAGNOSIS — Z954 Presence of other heart-valve replacement: Secondary | ICD-10-CM

## 2024-08-10 DIAGNOSIS — I050 Rheumatic mitral stenosis: Secondary | ICD-10-CM

## 2024-08-10 DIAGNOSIS — Z5181 Encounter for therapeutic drug level monitoring: Secondary | ICD-10-CM

## 2024-08-10 LAB — POCT INR: INR: 3.2 — AB (ref 2.0–3.0)

## 2024-08-10 NOTE — Patient Instructions (Signed)
 Description   INR-3.2; Spoke to patient and instructed to continue taking 1 tablet daily. Recheck INR in 2 weeks-will need to check sooner if starts IVIG med since can alter warfarin levels and monitor closely. Please call for any upcoming procedures Coumadin  Clinic [phone] *LET US  KNOW WHEN YOU START IVIG-NEED TO MONITOR CLOSELY*

## 2024-08-10 NOTE — Progress Notes (Signed)
 Description   INR-3.2; Spoke to patient and instructed to continue taking 1 tablet daily. Recheck INR in 2 weeks-will need to check sooner if starts IVIG med since can alter warfarin levels and monitor closely. Please call for any upcoming procedures Coumadin  Clinic [phone] *LET US  KNOW WHEN YOU START IVIG-NEED TO MONITOR CLOSELY*

## 2024-08-23 LAB — POCT INR: INR: 2.7 (ref 2.0–3.0)

## 2024-08-24 ENCOUNTER — Telehealth: Payer: Self-pay | Admitting: *Deleted

## 2024-08-24 ENCOUNTER — Ambulatory Visit (INDEPENDENT_AMBULATORY_CARE_PROVIDER_SITE_OTHER): Admitting: *Deleted

## 2024-08-24 DIAGNOSIS — I050 Rheumatic mitral stenosis: Secondary | ICD-10-CM

## 2024-08-24 DIAGNOSIS — I349 Nonrheumatic mitral valve disorder, unspecified: Secondary | ICD-10-CM

## 2024-08-24 DIAGNOSIS — Z954 Presence of other heart-valve replacement: Secondary | ICD-10-CM

## 2024-08-24 DIAGNOSIS — Z5181 Encounter for therapeutic drug level monitoring: Secondary | ICD-10-CM | POA: Diagnosis not present

## 2024-08-24 NOTE — Telephone Encounter (Signed)
 Called patient since INR is due; there was no answer and no voicemail.

## 2024-08-24 NOTE — Patient Instructions (Signed)
 Description   INR-2.7; Spoke to patient and instructed to take an extra 1/2 tablet of warfarin today since he missed Saturday dose then continue taking 1 tablet daily. Recheck INR in 2 weeks-will need to check sooner in 1 week if starts IVIG med since can alter warfarin levels and monitor closely. Please call for any upcoming procedures Coumadin  Clinic [phone] *LET US  KNOW WHEN YOU START IVIG-NEED TO MONITOR CLOSELY*

## 2024-08-24 NOTE — Progress Notes (Signed)
 Description   INR-2.7; Spoke to patient and instructed to take an extra 1/2 tablet of warfarin today since he missed Saturday dose then continue taking 1 tablet daily. Recheck INR in 2 weeks-will need to check sooner in 1 week if starts IVIG med since can alter warfarin levels and monitor closely. Please call for any upcoming procedures Coumadin  Clinic [phone] *LET US  KNOW WHEN YOU START IVIG-NEED TO MONITOR CLOSELY*

## 2024-08-31 LAB — POCT INR: INR: 2.9 (ref 2.0–3.0)

## 2024-09-01 ENCOUNTER — Ambulatory Visit (INDEPENDENT_AMBULATORY_CARE_PROVIDER_SITE_OTHER)

## 2024-09-01 DIAGNOSIS — Z5181 Encounter for therapeutic drug level monitoring: Secondary | ICD-10-CM

## 2024-09-01 DIAGNOSIS — Z954 Presence of other heart-valve replacement: Secondary | ICD-10-CM | POA: Diagnosis not present

## 2024-09-06 LAB — POCT INR: INR: 2.2 (ref 2.0–3.0)

## 2024-09-07 ENCOUNTER — Ambulatory Visit: Admitting: *Deleted

## 2024-09-07 DIAGNOSIS — Z5181 Encounter for therapeutic drug level monitoring: Secondary | ICD-10-CM

## 2024-09-07 DIAGNOSIS — I050 Rheumatic mitral stenosis: Secondary | ICD-10-CM | POA: Diagnosis not present

## 2024-09-07 DIAGNOSIS — Z954 Presence of other heart-valve replacement: Secondary | ICD-10-CM

## 2024-09-07 NOTE — Progress Notes (Addendum)
anti

## 2024-09-07 NOTE — Patient Instructions (Addendum)
 Description   INR-2.2; Spoke with patient and advised to take 1.5 tablets of warfarin today then continue taking 1 tablet daily. Recheck INR in 1 week- started IVIG 08/28/24. Please call for any upcoming procedures Coumadin  Clinic [phone]

## 2024-09-14 ENCOUNTER — Ambulatory Visit (INDEPENDENT_AMBULATORY_CARE_PROVIDER_SITE_OTHER): Admitting: Cardiovascular Disease

## 2024-09-14 DIAGNOSIS — Z5181 Encounter for therapeutic drug level monitoring: Secondary | ICD-10-CM

## 2024-09-14 DIAGNOSIS — Z954 Presence of other heart-valve replacement: Secondary | ICD-10-CM | POA: Diagnosis not present

## 2024-09-14 LAB — POCT INR: INR: 2.9 (ref 2.0–3.0)

## 2024-09-14 NOTE — Progress Notes (Signed)
 INR 2.9 Please see anticoagulation encounter Spoke with patient and advised to continue taking 1 tablet daily. Recheck INR in 1 week- started IVIG 08/28/24. Please call for any upcoming procedures Coumadin  Clinic [phone]

## 2024-09-21 ENCOUNTER — Telehealth: Payer: Self-pay | Admitting: *Deleted

## 2024-09-21 LAB — POCT INR: INR: 4.4 — AB (ref 2.0–3.0)

## 2024-09-21 NOTE — Telephone Encounter (Signed)
 Called patient since INR is due. He states he forget to do it this morning and will not be back home until 6p-7p tonight and he will get it done then and send it in. Will follow up tomorrow.

## 2024-09-22 ENCOUNTER — Ambulatory Visit (INDEPENDENT_AMBULATORY_CARE_PROVIDER_SITE_OTHER)

## 2024-09-22 DIAGNOSIS — Z5181 Encounter for therapeutic drug level monitoring: Secondary | ICD-10-CM | POA: Diagnosis not present

## 2024-09-22 DIAGNOSIS — Z954 Presence of other heart-valve replacement: Secondary | ICD-10-CM | POA: Diagnosis not present

## 2024-09-29 ENCOUNTER — Telehealth: Payer: Self-pay | Admitting: *Deleted

## 2024-09-29 NOTE — Telephone Encounter (Signed)
 Spoke with patient and he states he is not at home but will check INR when he gets home after 3pm today.

## 2024-09-30 ENCOUNTER — Ambulatory Visit (INDEPENDENT_AMBULATORY_CARE_PROVIDER_SITE_OTHER): Admitting: *Deleted

## 2024-09-30 ENCOUNTER — Telehealth: Payer: Self-pay | Admitting: *Deleted

## 2024-09-30 DIAGNOSIS — Z5181 Encounter for therapeutic drug level monitoring: Secondary | ICD-10-CM

## 2024-09-30 DIAGNOSIS — I349 Nonrheumatic mitral valve disorder, unspecified: Secondary | ICD-10-CM | POA: Diagnosis not present

## 2024-09-30 DIAGNOSIS — Z954 Presence of other heart-valve replacement: Secondary | ICD-10-CM

## 2024-09-30 DIAGNOSIS — I050 Rheumatic mitral stenosis: Secondary | ICD-10-CM

## 2024-09-30 LAB — POCT INR: INR: 3.2 — AB (ref 2.0–3.0)

## 2024-09-30 NOTE — Progress Notes (Signed)
 Description   INR-3.2; Spoke with patient and advised to continue taking 1 tablet daily. Recheck INR in 1 week- started IVIG 08/28/24. Please call for any upcoming procedures Coumadin  Clinic [phone]

## 2024-09-30 NOTE — Telephone Encounter (Signed)
 Called patient since INR is overdue, there was no answer and no voicemail. Will try back later

## 2024-09-30 NOTE — Patient Instructions (Signed)
 Description   INR-3.2; Spoke with patient and advised to continue taking 1 tablet daily. Recheck INR in 1 week- started IVIG 08/28/24. Please call for any upcoming procedures Coumadin  Clinic [phone]

## 2024-10-06 ENCOUNTER — Telehealth: Payer: Self-pay | Admitting: *Deleted

## 2024-10-06 NOTE — Telephone Encounter (Signed)
 Patient states he will be able to do his INR tomorrow early afternoon since he is currently out of town, he is aware we will need him to get it done as soon as he can tomorrow since office is closed Thursday and Friday

## 2024-10-07 ENCOUNTER — Telehealth: Payer: Self-pay | Admitting: *Deleted

## 2024-10-07 ENCOUNTER — Ambulatory Visit (INDEPENDENT_AMBULATORY_CARE_PROVIDER_SITE_OTHER): Payer: Self-pay | Admitting: *Deleted

## 2024-10-07 DIAGNOSIS — Z954 Presence of other heart-valve replacement: Secondary | ICD-10-CM

## 2024-10-07 DIAGNOSIS — I050 Rheumatic mitral stenosis: Secondary | ICD-10-CM

## 2024-10-07 DIAGNOSIS — Z5181 Encounter for therapeutic drug level monitoring: Secondary | ICD-10-CM

## 2024-10-07 LAB — POCT INR: INR: 1.8 — AB (ref 2.0–3.0)

## 2024-10-07 NOTE — Progress Notes (Signed)
 Description   INR-1.8; Spoke with patient and advised to take 1.5 tablets today then continue taking 1 tablet daily. Recheck INR in 1 week- started IVIG 08/28/24. Please call for any upcoming procedures.  Coumadin  Clinic [phone]

## 2024-10-07 NOTE — Patient Instructions (Signed)
 Description   INR-1.8; Spoke with patient and advised to take 1.5 tablets today then continue taking 1 tablet daily. Recheck INR in 1 week- started IVIG 08/28/24. Please call for any upcoming procedures.  Coumadin  Clinic [phone]

## 2024-10-07 NOTE — Telephone Encounter (Signed)
 Called patient to inquire if INR was done and he will do it now.

## 2024-10-14 ENCOUNTER — Ambulatory Visit (INDEPENDENT_AMBULATORY_CARE_PROVIDER_SITE_OTHER)

## 2024-10-14 DIAGNOSIS — Z954 Presence of other heart-valve replacement: Secondary | ICD-10-CM | POA: Diagnosis not present

## 2024-10-14 DIAGNOSIS — Z5181 Encounter for therapeutic drug level monitoring: Secondary | ICD-10-CM | POA: Diagnosis not present

## 2024-10-14 LAB — POCT INR: INR: 2.9 (ref 2.0–3.0)

## 2024-10-20 LAB — POCT INR: INR: 3 (ref 2.0–3.0)

## 2024-10-21 ENCOUNTER — Ambulatory Visit (INDEPENDENT_AMBULATORY_CARE_PROVIDER_SITE_OTHER)

## 2024-10-21 DIAGNOSIS — Z954 Presence of other heart-valve replacement: Secondary | ICD-10-CM

## 2024-10-21 DIAGNOSIS — Z5181 Encounter for therapeutic drug level monitoring: Secondary | ICD-10-CM | POA: Diagnosis not present

## 2024-10-28 ENCOUNTER — Ambulatory Visit (INDEPENDENT_AMBULATORY_CARE_PROVIDER_SITE_OTHER): Admitting: *Deleted

## 2024-10-28 DIAGNOSIS — Z5181 Encounter for therapeutic drug level monitoring: Secondary | ICD-10-CM | POA: Diagnosis not present

## 2024-10-28 DIAGNOSIS — Z954 Presence of other heart-valve replacement: Secondary | ICD-10-CM

## 2024-10-28 DIAGNOSIS — I349 Nonrheumatic mitral valve disorder, unspecified: Secondary | ICD-10-CM | POA: Diagnosis not present

## 2024-10-28 DIAGNOSIS — I050 Rheumatic mitral stenosis: Secondary | ICD-10-CM

## 2024-10-28 LAB — POCT INR: INR: 2 (ref 2.0–3.0)

## 2024-10-28 NOTE — Progress Notes (Signed)
 INR 2.0 Please see anticoagulation encounter Spoke to patient: Take 1.5 tablets today only then  continue taking 1 tablet daily. Recheck INR in 1 week- started IVIG 08/28/24. Please call for any upcoming procedures.  Coumadin  Clinic [phone]

## 2024-11-02 LAB — POCT INR: INR: 3.1 — AB (ref 2.0–3.0)

## 2024-11-03 ENCOUNTER — Ambulatory Visit (INDEPENDENT_AMBULATORY_CARE_PROVIDER_SITE_OTHER)

## 2024-11-03 DIAGNOSIS — Z954 Presence of other heart-valve replacement: Secondary | ICD-10-CM

## 2024-11-03 DIAGNOSIS — Z5181 Encounter for therapeutic drug level monitoring: Secondary | ICD-10-CM | POA: Diagnosis not present

## 2024-11-10 ENCOUNTER — Ambulatory Visit (INDEPENDENT_AMBULATORY_CARE_PROVIDER_SITE_OTHER): Admitting: *Deleted

## 2024-11-10 DIAGNOSIS — Z954 Presence of other heart-valve replacement: Secondary | ICD-10-CM | POA: Diagnosis not present

## 2024-11-10 DIAGNOSIS — Z5181 Encounter for therapeutic drug level monitoring: Secondary | ICD-10-CM | POA: Diagnosis not present

## 2024-11-10 DIAGNOSIS — I050 Rheumatic mitral stenosis: Secondary | ICD-10-CM

## 2024-11-10 DIAGNOSIS — I349 Nonrheumatic mitral valve disorder, unspecified: Secondary | ICD-10-CM

## 2024-11-10 LAB — POCT INR: INR: 3.1 — AB (ref 2.0–3.0)

## 2024-11-10 NOTE — Patient Instructions (Signed)
 Description   INR-3.1; Spoke to patient and advised to continue taking 1 tablet daily. Recheck INR in 1 week- started IVIG 08/28/24. Please call for any upcoming procedures.  Coumadin  Clinic [phone]

## 2024-11-10 NOTE — Progress Notes (Signed)
 Description   INR-3.1; Spoke to patient and advised to continue taking 1 tablet daily. Recheck INR in 1 week- started IVIG 08/28/24. Please call for any upcoming procedures.  Coumadin  Clinic [phone]

## 2024-11-16 ENCOUNTER — Ambulatory Visit (INDEPENDENT_AMBULATORY_CARE_PROVIDER_SITE_OTHER)

## 2024-11-16 DIAGNOSIS — Z954 Presence of other heart-valve replacement: Secondary | ICD-10-CM | POA: Diagnosis not present

## 2024-11-16 DIAGNOSIS — Z5181 Encounter for therapeutic drug level monitoring: Secondary | ICD-10-CM

## 2024-11-16 DIAGNOSIS — I349 Nonrheumatic mitral valve disorder, unspecified: Secondary | ICD-10-CM

## 2024-11-16 DIAGNOSIS — I050 Rheumatic mitral stenosis: Secondary | ICD-10-CM

## 2024-11-16 LAB — POCT INR: INR: 2.6 (ref 2.0–3.0)

## 2024-11-16 NOTE — Progress Notes (Signed)
 Description   INR-2.6; Spoke to patient and advised to continue taking 1 tablet daily. Recheck INR in 1 week- started IVIG 08/28/24. Please call for any upcoming procedures.  Coumadin  Clinic [phone]

## 2024-11-16 NOTE — Patient Instructions (Signed)
 Description   INR-2.6; Spoke to patient and advised to continue taking 1 tablet daily. Recheck INR in 1 week- started IVIG 08/28/24. Please call for any upcoming procedures.  Coumadin  Clinic [phone]

## 2024-11-24 ENCOUNTER — Ambulatory Visit

## 2024-11-24 DIAGNOSIS — Z954 Presence of other heart-valve replacement: Secondary | ICD-10-CM | POA: Diagnosis not present

## 2024-11-24 DIAGNOSIS — Z5181 Encounter for therapeutic drug level monitoring: Secondary | ICD-10-CM

## 2024-11-24 LAB — POCT INR: INR: 4.2 — AB (ref 2.0–3.0)

## 2024-11-30 ENCOUNTER — Ambulatory Visit: Admitting: Pharmacist

## 2024-11-30 DIAGNOSIS — I050 Rheumatic mitral stenosis: Secondary | ICD-10-CM

## 2024-11-30 DIAGNOSIS — Z954 Presence of other heart-valve replacement: Secondary | ICD-10-CM

## 2024-11-30 DIAGNOSIS — Z5181 Encounter for therapeutic drug level monitoring: Secondary | ICD-10-CM

## 2024-11-30 LAB — POCT INR: INR: 1.1 — AB (ref 2.0–3.0)

## 2024-11-30 NOTE — Progress Notes (Addendum)
 Description   INR 1.1: Spoke with patient, lost his meds but then found them. Took 2 tablets last night. Advised to take 10mg  today and tomorrow then continue taking 1 tablet daily. Recheck INR in 1 week- started IVIG 08/28/24. Please call for any upcoming procedures.  Coumadin  Clinic [phone]

## 2024-11-30 NOTE — Patient Instructions (Signed)
 Description   INR 1.1: Spoke with patient, lost his meds but then found them. Took 2 tablets last night. Advised to take 10mg  today and tomorrow then continue taking 1 tablet daily. Recheck INR in 1 week- started IVIG 08/28/24. Please call for any upcoming procedures.  Coumadin  Clinic [phone]

## 2024-12-08 ENCOUNTER — Ambulatory Visit (INDEPENDENT_AMBULATORY_CARE_PROVIDER_SITE_OTHER): Admitting: Cardiology

## 2024-12-08 DIAGNOSIS — Z954 Presence of other heart-valve replacement: Secondary | ICD-10-CM

## 2024-12-08 DIAGNOSIS — Z5181 Encounter for therapeutic drug level monitoring: Secondary | ICD-10-CM

## 2024-12-08 LAB — POCT INR: INR: 2.7 (ref 2.0–3.0)

## 2024-12-09 ENCOUNTER — Ambulatory Visit: Admitting: Cardiovascular Disease

## 2024-12-09 ENCOUNTER — Encounter: Payer: Self-pay | Admitting: Cardiovascular Disease

## 2024-12-09 VITALS — BP 110/68 | HR 93 | Ht 74.0 in | Wt 211.4 lb

## 2024-12-09 DIAGNOSIS — I272 Pulmonary hypertension, unspecified: Secondary | ICD-10-CM | POA: Diagnosis not present

## 2024-12-09 DIAGNOSIS — I342 Nonrheumatic mitral (valve) stenosis: Secondary | ICD-10-CM

## 2024-12-09 DIAGNOSIS — I050 Rheumatic mitral stenosis: Secondary | ICD-10-CM

## 2024-12-09 DIAGNOSIS — I4729 Other ventricular tachycardia: Secondary | ICD-10-CM | POA: Diagnosis not present

## 2024-12-09 DIAGNOSIS — G8929 Other chronic pain: Secondary | ICD-10-CM

## 2024-12-09 DIAGNOSIS — I5032 Chronic diastolic (congestive) heart failure: Secondary | ICD-10-CM

## 2024-12-09 NOTE — Progress Notes (Signed)
 "     12/09/2024 Darren Haynes   1986/07/19  [id number]  [facility], [facility] Primary Cardiologist: Dorn JINNY Lesches MD FACP, [place], [place], FSCAI  HPI:  Darren Haynes is a 39 y.o.    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 Licia for evaluation of symptomatic mitral stenosis.  I last saw him in the office 05/09/2021.  He is currently not working.  There is no history of rheumatic fever.  He has no other cardiac risk factors.  He is had progressive dyspnea for the last 9 months with some nonspecific aches and pains.  Is being worked up by rheumatologist.  Echo performed 11/21/2018 revealed normal LV systolic function, a peak mitral gradient of 47 mmHg with a mean of 29 mmHg and mitral valve area 1.5 cm with a PA pressure of 69 mmHg.  He does have severe dyspnea and lower extreme edema.  He is on oral diuretic.  He presented today for transesophageal echocardiogram performed Dr. 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   I did right left heart cath revealing a significant mitral valve gradient, and normal coronary arteries.  I referred him to Dr. Cesario at [facility] who performed successful mitral balloon valvuloplasty with almost immediate improvement in his pulmonary artery pressures.  12/25/2018.  He felt clinically improved.   Because of increasing symptoms of mitral stenosis I referred him to Dr. Dusty who ultimately performed mitral valve replacement with a Sorin [employer] bileaflet mechanical valve (29 mm) done minimally invasively on 05/14/2019.    He has done well postoperatively except for volume overload controlled with oral diuretics and tachycardia.   I did obtain a 2D echo 06/23/2019 revealed normal LV size and function, no evidence of pericardial effusion with a well-functioning aortic mechanical prosthesis.  An  event monitor performed 05/25/2019 showed sinus rhythm/sinus tachycardia with occasional PVCs.      He has seen Dr. Dusty in the office as well as how [facility].  His 2D echo did reveal normal LV size and function with a well-functioning aortic mechanical prosthesis and no evidence of pericardial effusion.  His palpitations have improved as well.  His dyspnea is markedly improved in addition on oral diuretics.  He underwent left total hip replacement tachycardia [facility] which has had some complications and infection.  He is walking with crutches.   Since I saw him 3-1/2 years ago he has been followed at [facility] by Dr. Clarinda and Dr. Erle.  He has seen several of our APP's in the interim as well.  He had a coronary CTA performed/21/23 revealing a coronary calcium score of 0 with no evidence of obstructive CAD.  He also had a Myoview  stress test performed 07/19/2023 which is low risk and nonischemic.  His last 2D echo in our system was from 07/26/2023 revealing normal LV systolic function, a well-functioning mitral mechanical prosthesis and mild AI.  He has been complaining of increasing shortness of breath and some atypical chest pain.  Recent 2D echo did performed 10/20/2024 revealed a slight decline in LV function with an EF of 45%, mild right ventricular systolic dysfunction with moderate AI and a well-functioning mitral mechanical prosthesis with moderate TR.  He was placed on Entresto as well as an SGLT2 inhibitor and an MRA.  His symptoms have  somewhat improved.   Active Medications[1]   Allergies[2]  Social History   Socioeconomic History   Marital status: Married    Spouse name: Not on file   Number of children: Not on file   Years of education: Not on file   Highest education level: Not on file  Occupational History   Not on file  Tobacco Use   Smoking status: Never   Smokeless tobacco: Current    Types: Snuff, Chew  Vaping Use   Vaping status: Never Used  Substance and  Sexual Activity   Alcohol use: No   Drug use: Yes    Types: Marijuana    Comment: last use 05/02/22 (use every day)   Sexual activity: Not on file  Other Topics Concern   Not on file  Social History Narrative   Not on file   Social Drivers of Health   Tobacco Use: High Risk (12/09/2024)   Patient History    Smoking Tobacco Use: Never    Smokeless Tobacco Use: Current    Passive Exposure: Not on file  Financial Resource Strain: Not on file  Food Insecurity: No Food Insecurity (05/18/2024)   Received from [facility]   Epic    Within the past 12 months, you worried that your food would run out before you got the money to buy more.: Never true    Within the past 12 months, the food you bought just didn't last and you didn't have money to get more.: Never true  Transportation Needs: No Transportation Needs (08/13/2023)   Received from [facility] Transportation    In the past 12 months, has lack of transportation kept you from medical appointments or from getting medications?: No    Lack of Transportation (Non-Medical): No  Physical Activity: Not on file  Stress: Not on file  Social Connections: Not on file  Intimate Partner Violence: Not on file  Depression (EYV7-0): Not on file  Alcohol Screen: Not on file  Housing: Unknown (03/10/2024)   Received from [facility]   Epic    Unable to Pay for Housing in the Last Year: Not on file    Number of Times Moved in the Last Year: Not on file    At any time in the past 12 months, were you homeless or living in a shelter (including now)?: No  Utilities: Not on file  Health Literacy: Not on file     Review of Systems: General: negative for chills, fever, night sweats or weight changes.  Cardiovascular: negative for chest pain, dyspnea on exertion, edema, orthopnea, palpitations, paroxysmal nocturnal dyspnea or shortness of breath Dermatological: negative for  rash Respiratory: negative for cough or wheezing Urologic: negative for hematuria Abdominal: negative for nausea, vomiting, diarrhea, bright red blood per rectum, melena, or hematemesis Neurologic: negative for visual changes, syncope, or dizziness All other systems reviewed and are otherwise negative except as noted above.    Blood pressure 110/68, pulse 93, height 6' 2 (1.88 m), weight 211 lb 6.4 oz (95.9 kg), SpO2 98%.  General appearance: alert and no distress Neck: no adenopathy, no carotid bruit, no JVD, supple, symmetrical, trachea midline, and thyroid  not enlarged, symmetric, no tenderness/mass/nodules Lungs: clear to auscultation bilaterally Heart: Crisp mechanical prosthetic mitral valve sounds Extremities: extremities normal, atraumatic, no cyanosis or edema Pulses: 2+ and symmetric Skin: Skin color, texture, turgor normal. 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 Cesario 12/25/2018.  Because of recurrent stenosis I referred him to Dr. Dusty  who ultimately did a minimally invasive mitral valve replacement using a Sorin [employer] bileaflet mechanical valve (29 mm) with excellent result.  He is on Coumadin  anticoagulation.  His most recent echo in our system 07/26/2023 revealed normal LV systolic function, normal mechanical mitral valvular function with mild AI.   He has complained of increasing shortness of breath.  He had a recent echo performed by Dr. Clarinda at [facility] 10/20/2024 revealing a decline in his EF to 45% with moderate aortic insufficiency and a well-functioning mitral prosthesis.  He was begun on Entresto and is currently on an SGLT2 and an MRA.  His shortness of breath is slowly improving.  I am concerned that his decline in LV function may be related to his aortic insufficiency.  A follow-up 2D echo is scheduled at [facility] in 3 months.  Chronic pain Thought to be costochondritis.  He had normal coronary arteries by cath prior to his mitral valve replacement as well as a negative Myoview  and coronary CTA with a coronary calcium score of 0.  His pain does not sound cardiac.     Carrier DOROTHA Court MD FACP,FACC,FAHA, FSCAI 12/09/2024 9:44 AM    [1]  Current Meds  Medication Sig   ALPRAZolam (XANAX) 0.5 MG tablet Take 0.5 mg by mouth 2 (two) times daily as needed.   celecoxib (CELEBREX) 200 MG capsule Take 200 mg by mouth in the morning and at bedtime.   cephALEXin (KEFLEX) 500 MG capsule Take 500 mg by mouth 3 (three) times daily.   desvenlafaxine (PRISTIQ) 100 MG 24 hr tablet Take 100 mg by mouth at bedtime.   empagliflozin (JARDIANCE) 10 MG TABS tablet Take 10 mg by mouth daily.   eplerenone (INSPRA) 25 MG tablet Take 25 mg by mouth daily.   furosemide  (LASIX ) 40 MG tablet Take 1 tablet (40 mg total) by mouth daily. You may take an extra 20mg  as needed for volume overload. (Patient taking differently: Take 40 mg by mouth daily as needed (fluid retention.).)   gabapentin  (NEURONTIN ) 800 MG tablet Take 800 mg by mouth 3 (three) times daily.   HIZENTRA 10 GM/50ML SOSY    HIZENTRA 4 GM/20ML SOSY    ixekizumab (TALTZ) 80 MG/ML pen Inject 80 mg into the skin every 28 (twenty-eight) days.   metoprolol  tartrate (LOPRESSOR ) 25 MG tablet Take 0.5 tablets (12.5 mg total) by mouth 2 (two) times daily.   ondansetron  (ZOFRAN ) 4 MG tablet Take 4 mg by  mouth every 8 (eight) hours as needed for nausea/vomiting.   potassium chloride  (KLOR-CON ) 10 MEQ tablet Take 1 tablet (10 mEq total) by mouth daily. (Patient taking differently: Take 10 mEq by mouth daily as needed (fluid retention (take with lasix )).)   risperiDONE (RISPERDAL) 1 MG tablet Take 1 mg by mouth at bedtime.   sacubitril-valsartan (ENTRESTO) 24-26 MG Take 1 tablet by mouth every  12 (twelve) hours.   testosterone cypionate (DEPOTESTOSTERONE CYPIONATE) 200 MG/ML injection Inject 200 mg into the muscle every Tuesday.   tiZANidine (ZANAFLEX) 4 MG tablet Take 4 mg by mouth 3 (three) times daily.   Vitamin D, Ergocalciferol, (DRISDOL) 1.25 MG (50000 UNIT) CAPS capsule Take 50,000 Units by mouth every Tuesday.   warfarin (COUMADIN ) 5 MG tablet TAKE 1 TABLET BY MOUTH DAILY OR  AS DIRECTED BY COUMADIN  CLINIC  [2] No Known Allergies  "

## 2024-12-09 NOTE — Assessment & Plan Note (Signed)
 Thought to be costochondritis.  He had normal coronary arteries by cath prior to his mitral valve replacement as well as a negative Myoview  and coronary CTA with a coronary calcium score of 0.  His pain does not sound cardiac.

## 2024-12-09 NOTE — Assessment & Plan Note (Signed)
 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 Cesario 12/25/2018.  Because of recurrent stenosis I referred him to Dr. Dusty  who ultimately did a minimally invasive mitral valve replacement using a Sorin [employer] bileaflet mechanical valve (29 mm) with excellent result.  He is on Coumadin  anticoagulation.  His most recent echo in our system 07/26/2023 revealed normal LV systolic function, normal mechanical mitral valvular function with mild AI.  He has complained of increasing shortness of breath.  He had a recent echo performed by Dr. Clarinda at [facility] 10/20/2024 revealing a decline in his EF to 45% with moderate aortic insufficiency and a well-functioning mitral prosthesis.  He was begun on Entresto and is currently on an SGLT2 and an MRA.  His shortness of breath is slowly improving.  I am concerned that his decline in LV function may be related to his aortic insufficiency.  A follow-up 2D echo is scheduled at [facility] in 3 months.

## 2024-12-09 NOTE — Patient Instructions (Signed)
 Medication Instructions:  Your physician recommends that you continue on your current medications as directed. Please refer to the Current Medication list given to you today.  *If you need a refill on your cardiac medications before your next appointment, please call your pharmacy*   Follow-Up: At [facility], you and your health needs are our priority.  As part of our continuing mission to provide you with exceptional heart care, our providers are all part of one team.  This team includes your primary Cardiologist (physician) and Advanced Practice Providers or APPs (Physician Assistants and Nurse Practitioners) who all work together to provide you with the care you need, when you need it.  Your next appointment:   We will see you on an as needed basis.   Provider:   Dorn Lesches, MD     We recommend signing up for the patient portal called MyChart.  Sign up information is provided on this After Visit Summary.  MyChart is used to connect with patients for Virtual Visits (Telemedicine).  Patients are able to view lab/test results, encounter notes, upcoming appointments, etc.  Non-urgent messages can be sent to your provider as well.   To learn more about what you can do with MyChart, go to [email or web address].   Other Instructions

## 2024-12-14 ENCOUNTER — Ambulatory Visit (INDEPENDENT_AMBULATORY_CARE_PROVIDER_SITE_OTHER): Admitting: *Deleted

## 2024-12-14 ENCOUNTER — Telehealth: Payer: Self-pay | Admitting: *Deleted

## 2024-12-14 DIAGNOSIS — Z5181 Encounter for therapeutic drug level monitoring: Secondary | ICD-10-CM

## 2024-12-14 DIAGNOSIS — I050 Rheumatic mitral stenosis: Secondary | ICD-10-CM

## 2024-12-14 DIAGNOSIS — Z954 Presence of other heart-valve replacement: Secondary | ICD-10-CM | POA: Diagnosis not present

## 2024-12-14 DIAGNOSIS — I349 Nonrheumatic mitral valve disorder, unspecified: Secondary | ICD-10-CM

## 2024-12-14 LAB — POCT INR: INR: 4.3 — AB (ref 2.0–3.0)

## 2024-12-14 NOTE — Patient Instructions (Signed)
 Description   INR-4.3; Spoke with patient and advised not to take any warfarin today then   continue taking 1 tablet daily. Recheck INR in 1 week- started IVIG 08/28/24. Please call for any upcoming procedures.  Coumadin  Clinic [phone]

## 2024-12-14 NOTE — Telephone Encounter (Signed)
 Called patient since INR is due. He states he will get it done soon.

## 2024-12-14 NOTE — Progress Notes (Signed)
 Description   INR-4.3; Spoke with patient and advised not to take any warfarin today then   continue taking 1 tablet daily. Recheck INR in 1 week- started IVIG 08/28/24. Please call for any upcoming procedures.  Coumadin  Clinic [phone]
